# Patient Record
Sex: Male | Born: 1945
Health system: Southern US, Community
[De-identification: ages and names within clinical notes are randomized; demographics above are authoritative.]

## PROBLEM LIST (undated history)

## (undated) DIAGNOSIS — I10 Essential (primary) hypertension: Secondary | ICD-10-CM

## (undated) DIAGNOSIS — R0600 Dyspnea, unspecified: Secondary | ICD-10-CM

## (undated) DIAGNOSIS — N400 Enlarged prostate without lower urinary tract symptoms: Secondary | ICD-10-CM

## (undated) DIAGNOSIS — J449 Chronic obstructive pulmonary disease, unspecified: Secondary | ICD-10-CM

## (undated) DIAGNOSIS — F32A Depression, unspecified: Secondary | ICD-10-CM

## (undated) DIAGNOSIS — M1990 Unspecified osteoarthritis, unspecified site: Secondary | ICD-10-CM

## (undated) DIAGNOSIS — G4730 Sleep apnea, unspecified: Secondary | ICD-10-CM

## (undated) DIAGNOSIS — E7800 Pure hypercholesterolemia, unspecified: Secondary | ICD-10-CM

## (undated) DIAGNOSIS — I499 Cardiac arrhythmia, unspecified: Secondary | ICD-10-CM

## (undated) DIAGNOSIS — K219 Gastro-esophageal reflux disease without esophagitis: Secondary | ICD-10-CM

## (undated) DIAGNOSIS — F419 Anxiety disorder, unspecified: Secondary | ICD-10-CM

## (undated) DIAGNOSIS — Z974 Presence of external hearing-aid: Secondary | ICD-10-CM

## (undated) HISTORY — DX: Presence of external hearing-aid: Z97.4

## (undated) HISTORY — DX: Benign prostatic hyperplasia without lower urinary tract symptoms: N40.0

## (undated) HISTORY — PX: UPPER GI ENDOSCOPY: SHX6162

## (undated) HISTORY — DX: Pure hypercholesterolemia, unspecified: E78.00

## (undated) HISTORY — DX: Chronic obstructive pulmonary disease, unspecified: J44.9

## (undated) HISTORY — DX: Unspecified osteoarthritis, unspecified site: M19.90

---

## 1968-07-13 HISTORY — PX: CHEEK AUGMENTATION: SHX1335

## 2003-07-14 HISTORY — PX: EYE SURGERY: SHX253

## 2015-07-14 HISTORY — PX: COLONOSCOPY: SHX174

## 2016-01-30 DIAGNOSIS — R0683 Snoring: Secondary | ICD-10-CM | POA: Insufficient documentation

## 2016-01-30 DIAGNOSIS — G4710 Hypersomnia, unspecified: Secondary | ICD-10-CM | POA: Insufficient documentation

## 2016-01-30 DIAGNOSIS — G4733 Obstructive sleep apnea (adult) (pediatric): Secondary | ICD-10-CM | POA: Insufficient documentation

## 2019-05-23 ENCOUNTER — Other Ambulatory Visit: Payer: Self-pay

## 2019-05-23 ENCOUNTER — Encounter: Payer: Self-pay | Admitting: Nurse Practitioner

## 2019-05-23 ENCOUNTER — Ambulatory Visit: Payer: Medicare Other | Admitting: Nurse Practitioner

## 2019-05-23 VITALS — BP 138/84 | HR 67 | Temp 98.3°F | Ht 72.0 in | Wt 205.0 lb

## 2019-05-23 DIAGNOSIS — J449 Chronic obstructive pulmonary disease, unspecified: Secondary | ICD-10-CM | POA: Diagnosis not present

## 2019-05-23 DIAGNOSIS — K219 Gastro-esophageal reflux disease without esophagitis: Secondary | ICD-10-CM | POA: Diagnosis not present

## 2019-05-23 DIAGNOSIS — M170 Bilateral primary osteoarthritis of knee: Secondary | ICD-10-CM

## 2019-05-23 DIAGNOSIS — F5101 Primary insomnia: Secondary | ICD-10-CM

## 2019-05-23 DIAGNOSIS — N401 Enlarged prostate with lower urinary tract symptoms: Secondary | ICD-10-CM

## 2019-05-23 DIAGNOSIS — R350 Frequency of micturition: Secondary | ICD-10-CM

## 2019-05-23 NOTE — Progress Notes (Signed)
Careteam: No care team member to display  Advanced Directive information Does Patient Have a Medical Advance Directive?: Yes, Type of Advance Directive: Living will, Does patient want to make changes to medical advance directive?: No - Patient declined  Allergies  Allergen Reactions  . Bee Venom     Throat/mouth swelling    Chief Complaint  Patient presents with  . Establish Care    Patient is seen to establish care with [facility], wanting orthopedic referral      HPI: Patient is a 73 y.o. male seen in today at the [facility] to establish care.   Recently moved to [place] from [place]. His sister lives in [place] but the continuum of care is what brought them to [place]. Pt has family hx of memory issues and suspects he will too.   Osteoarthritis- mostly has been pain in knees, uses tylenol 500 mg by mouth as needed. Uses omega 3 for points and heart health. Uses nabumetone 750 mg 2 tablets daily. Would like referral to orthopedic who can do. Bilateral knees, left worse than right.   Family hx of memory loss- on prevagen 10 mg daily  COPD- uses albuterol HFA and nebulizer takes every other day. If he does not use this every other day he feels very short of breath.  Had PFT done but has been several years.  Overall health takes several supplements such as vit C, ASA 81 mg daily, B complex, vit B12, biotin 1000 mcg TID, cod liver oil daily, garlic daily, gelatin [id number] mg daily, vit D supplement   GERD- uses tums as needed and uses pepcid 20 mg daily   Insomnia- using benadryl 25 mg by mouth daily for sleep. Uses temazepam 15 mg as needed at bedtime  -rarely uses and did not bring Rx bottle today  BPH- using finasteride 5 mg daily with Flomax 0.4 mg daily and saw palmetto   Hyperlipidemia- taking zocor 40 mg by mouth daily, does not follow dietary modifications. Also uses celenium 200 mcg   Had physical and AWV this year around June.  Review of  Systems:  Review of Systems  Constitutional: Negative for chills, fever and weight loss.  HENT: Negative for tinnitus.   Respiratory: Positive for shortness of breath. Negative for cough and sputum production.   Cardiovascular: Negative for chest pain, palpitations and leg swelling.  Gastrointestinal: Negative for abdominal pain, constipation, diarrhea and heartburn.  Genitourinary: Positive for frequency. Negative for dysuria and urgency.  Musculoskeletal: Negative for back pain, falls, joint pain and myalgias.  Skin: Negative.   Neurological: Negative for dizziness and headaches.  Psychiatric/Behavioral: Negative for depression and memory loss. The patient has insomnia.     Past Medical History:  Diagnosis Date  . Arthritis    Knees  . COPD (chronic obstructive pulmonary disease) ([place])   . Does use hearing aid    Bilateral  . Enlarged prostate   . Hypercholesterolemia    Past Surgical History:  Procedure Laterality Date  . COLONOSCOPY  2017   Dr. Madolyn Frieze   Social History:   reports that he quit smoking about 40 years ago. His smoking use included cigarettes. He smoked 1.00 pack per day. He has never used smokeless tobacco. He reports previous alcohol use. No history on file for drug.  Family History  Problem Relation Age of Onset  . Alzheimer's disease Mother   . Diabetes Mother   . Heart failure Father   . Heart attack  Father   . Diabetes Father   . Dementia Father     Medications: Patient's Medications  New Prescriptions   No medications on file  Previous Medications   ACETAMINOPHEN (TYLENOL) 500 MG TABLET    Take 500 mg by mouth as needed.   ALBUTEROL (VENTOLIN HFA) 108 (90 BASE) MCG/ACT INHALER    Inhale 2 puffs into the lungs every 4 (four) hours as needed for wheezing or shortness of breath.   APOAEQUORIN (PREVAGEN) 10 MG CAPS    Take by mouth.   ASCORBIC ACID (VITAMIN C) 1000 MG TABLET    Take 1,000 mg by mouth daily.   ASPIRIN EC 81 MG TABLET    Take 81 mg by  mouth daily.   B COMPLEX-C (SUPER B-COMPLEX + VITAMIN C PO)    Take by mouth.   BIOTIN 1000 MCG TABLET    Take 1,000 mcg by mouth 3 (three) times daily.   BLACK ELDERBERRY,BERRY-FLOWER, 575 MG CAPS    Take by mouth.   CALCIUM ELEMENTAL AS CARBONATE (BARIATRIC TUMS ULTRA) 400 MG CHEWABLE TABLET    Chew 1,000 mg by mouth 3 (three) times daily.   CHOLECALCIFEROL 50 MCG (2000 UT) TABS    Take by mouth.   COD LIVER OIL/VITAMINS A & D CAPS    Take by mouth.   DIPHENHYDRAMINE (BENADRYL) 25 MG TABLET    Take 25 mg by mouth.   DUTASTERIDE (AVODART) 0.5 MG CAPSULE    Take 0.5 mg by mouth daily.   DUTASTERIDE-TAMSULOSIN HCL 0.5-0.4 MG CAPS    Take by mouth.   FAMOTIDINE (PEPCID) 20 MG TABLET    Take 20 mg by mouth.   FENOPROFEN (NALFON) 600 MG TABS TABLET    Take 600 mg by mouth.   FINASTERIDE (PROSCAR) 5 MG TABLET    Take 5 mg by mouth daily.   GARLIC 123XX123 MG CAPS    Take by mouth.   GELATIN 650 MG CAPSULE    Take 1,300 mg by mouth daily.   L-ARGININE 500 MG CAPS    Take by mouth.   LORATADINE (CLARITIN) 10 MG TABLET    Take 10 mg by mouth daily.   MILK THISTLE 175 MG TABLET    Take 175 mg by mouth daily.   MISC NATURAL PRODUCTS (OSTEO BI-FLEX ADV TRIPLE ST) TABS    Take by mouth.   MULTIPLE VITAMINS-MINERALS (ONE DAILY MENS HEALTH) TABS    Take by mouth.   NABUMETONE (RELAFEN) 750 MG TABLET    Take 1,500 mg by mouth daily. 2 tablets to = 1500 mg   OMEGA-3 1000 MG CAPS    Take by mouth.   SAW PALMETTO 450 MG CAPS    Take by mouth.   SELENIUM 200 MCG TABS TABLET    Take by mouth daily.   SIMVASTATIN (ZOCOR) 40 MG TABLET    Take 40 mg by mouth daily.   TAMSULOSIN (FLOMAX) 0.4 MG CAPS CAPSULE    Take 0.4 mg by mouth.   VITAMIN B-12 (CYANOCOBALAMIN) 500 MCG TABLET    Take 500 mcg by mouth daily.   VITAMIN E 180 MG CAPS    Take by mouth.  Modified Medications   No medications on file  Discontinued Medications   No medications on file    Physical Exam:  Vitals:   05/23/19 1420  BP: 138/84   Pulse: 67  Temp: 98.3 F (36.8 C)  TempSrc: Oral  SpO2: 97%  Weight: 205 lb (93 kg)  Height:  6' (1.829 m)   Body mass index is 27.8 kg/m. Wt Readings from Last 3 Encounters:  05/23/19 205 lb (93 kg)    Physical Exam Constitutional:      General: He is not in acute distress.    Appearance: He is well-developed. He is not diaphoretic.  HENT:     Head: Normocephalic and atraumatic.     Mouth/Throat:     Pharynx: No oropharyngeal exudate.  Eyes:     Conjunctiva/sclera: Conjunctivae normal.     Pupils: Pupils are equal, round, and reactive to light.  Neck:     Musculoskeletal: Normal range of motion and neck supple.  Cardiovascular:     Rate and Rhythm: Normal rate and regular rhythm.     Heart sounds: Normal heart sounds.  Pulmonary:     Effort: Pulmonary effort is normal.     Breath sounds: Normal breath sounds.  Abdominal:     General: Bowel sounds are normal.     Palpations: Abdomen is soft.  Musculoskeletal:        General: No tenderness.  Skin:    General: Skin is warm and dry.  Neurological:     Mental Status: He is alert and oriented to person, place, and time.     Labs reviewed: Basic Metabolic Panel: No results for input(s): NA, K, CL, CO2, GLUCOSE, BUN, CREATININE, CALCIUM, MG, PHOS, TSH in the last 8760 hours. Liver Function Tests: No results for input(s): AST, ALT, ALKPHOS, BILITOT, PROT, ALBUMIN in the last 8760 hours. No results for input(s): LIPASE, AMYLASE in the last 8760 hours. No results for input(s): AMMONIA in the last 8760 hours. CBC: No results for input(s): WBC, NEUTROABS, HGB, HCT, MCV, PLT in the last 8760 hours. Lipid Panel: No results for input(s): CHOL, HDL, LDLCALC, TRIG, CHOLHDL, LDLDIRECT in the last 8760 hours. TSH: No results for input(s): TSH in the last 8760 hours. A1C: No results found for: HGBA1C   Assessment/Plan 1. Primary osteoarthritis of both knees -ongoing pain, uses nabumetone 1500 mg by mouth daily with  tylenol as needed. Discussed adverse effect of NSAIDs.  - AMB referral to orthopedics  2. Gastroesophageal reflux disease without esophagitis -stable on famotidine 20 mg by mouth daily, uses on tum   3. Chronic obstructive pulmonary disease, unspecified COPD type ([place]) -using albuterol every other day but no long term inhaler. Discussed proper management of COPD. Encouraged him to use albuterol as needed only and not routinely. If he is still needed frequently will consider daily treatment.   4. Benign prostatic hyperplasia with urinary frequency -has been on multiple combination in the past. Currently maintained on flomax and Proscar.   5. Primary insomnia -uses benadryl routinely, information provided about adverse effects of benadryl use. Recommended to stop and to use melatonin 3-6 mg by mouth daily at bedtime. Reports occasionally will use temazepam but did not bring medication bottles.   Next appt: 2 months for routine follow up [place]. Harle Battiest  [facility] & Adult Medicine [phone]   Total time 60 mins:  time greater than 50% of total time spent doing pt counseling and coordination of care.

## 2019-05-23 NOTE — Patient Instructions (Addendum)
FOR SLEEP To stop benadryl  To use melatonin 3-6 mg by mouth at bedtime.   For COPD Try to minimized albuterol- use this as needed only   If you do not hear back about referral in a week please call our office back and ask to speak with Lattie Haw (our referral coordinator) for an update.

## 2019-07-27 DIAGNOSIS — M1711 Unilateral primary osteoarthritis, right knee: Secondary | ICD-10-CM | POA: Insufficient documentation

## 2019-07-27 DIAGNOSIS — M25562 Pain in left knee: Secondary | ICD-10-CM | POA: Diagnosis not present

## 2019-07-27 DIAGNOSIS — M1712 Unilateral primary osteoarthritis, left knee: Secondary | ICD-10-CM | POA: Diagnosis not present

## 2019-07-31 ENCOUNTER — Ambulatory Visit: Payer: Self-pay | Admitting: Nurse Practitioner

## 2019-08-01 ENCOUNTER — Encounter: Payer: Self-pay | Admitting: Nurse Practitioner

## 2019-08-01 ENCOUNTER — Other Ambulatory Visit: Payer: Self-pay

## 2019-08-01 ENCOUNTER — Encounter: Payer: Medicare PPO | Admitting: Nurse Practitioner

## 2019-08-01 NOTE — Progress Notes (Signed)
err

## 2019-08-07 ENCOUNTER — Telehealth: Payer: Self-pay

## 2019-08-07 NOTE — Telephone Encounter (Signed)
Patient called to cancel his appointment at [facility] on 08/10/19. He declined to reschedule.  I did notice that he has an appointment with Dr. Silvio Pate to establish as a new patient.  I cancelled his appointment as requested.

## 2019-08-07 NOTE — Telephone Encounter (Signed)
Noted  

## 2019-08-10 ENCOUNTER — Ambulatory Visit: Payer: Medicare PPO | Admitting: Nurse Practitioner

## 2019-08-10 ENCOUNTER — Ambulatory Visit: Payer: Self-pay | Admitting: Nurse Practitioner

## 2019-08-10 ENCOUNTER — Encounter: Payer: Self-pay | Admitting: Nurse Practitioner

## 2019-08-10 ENCOUNTER — Other Ambulatory Visit: Payer: Self-pay

## 2019-08-10 VITALS — BP 150/90 | HR 62 | Temp 97.4°F | Resp 18 | Ht 72.0 in | Wt 212.5 lb

## 2019-08-10 DIAGNOSIS — E782 Mixed hyperlipidemia: Secondary | ICD-10-CM | POA: Diagnosis not present

## 2019-08-10 DIAGNOSIS — J449 Chronic obstructive pulmonary disease, unspecified: Secondary | ICD-10-CM

## 2019-08-10 DIAGNOSIS — N529 Male erectile dysfunction, unspecified: Secondary | ICD-10-CM

## 2019-08-10 DIAGNOSIS — I10 Essential (primary) hypertension: Secondary | ICD-10-CM | POA: Diagnosis not present

## 2019-08-10 DIAGNOSIS — J302 Other seasonal allergic rhinitis: Secondary | ICD-10-CM

## 2019-08-10 DIAGNOSIS — R7989 Other specified abnormal findings of blood chemistry: Secondary | ICD-10-CM

## 2019-08-10 DIAGNOSIS — M170 Bilateral primary osteoarthritis of knee: Secondary | ICD-10-CM | POA: Diagnosis not present

## 2019-08-10 DIAGNOSIS — F5101 Primary insomnia: Secondary | ICD-10-CM

## 2019-08-10 DIAGNOSIS — N401 Enlarged prostate with lower urinary tract symptoms: Secondary | ICD-10-CM | POA: Diagnosis not present

## 2019-08-10 DIAGNOSIS — R350 Frequency of micturition: Secondary | ICD-10-CM

## 2019-08-10 MED ORDER — FLUTICASONE PROPIONATE 50 MCG/ACT NA SUSP: 1.0000 | Freq: Every day | NASAL | 1 refills | Status: DC

## 2019-08-10 MED ORDER — LORATADINE 10 MG PO TABS: 10.0000 mg | ORAL_TABLET | Freq: Every day | ORAL | 1 refills | Status: DC

## 2019-08-10 MED ORDER — TADALAFIL 20 MG PO TABS: 20.0000 mg | ORAL_TABLET | Freq: Every day | ORAL | 0 refills | Status: DC | PRN

## 2019-08-10 MED ORDER — TAMSULOSIN HCL 0.4 MG PO CAPS: 0.4000 mg | ORAL_CAPSULE | Freq: Every day | ORAL | 1 refills | Status: DC

## 2019-08-10 MED ORDER — FINASTERIDE 5 MG PO TABS: 5.0000 mg | ORAL_TABLET | Freq: Every day | ORAL | 1 refills | Status: DC

## 2019-08-10 NOTE — Patient Instructions (Addendum)
  For testosterone- [facility] [phone] Mar[place] Emery   To come back to [place] [place] between 7-7:15 on Monday or Thursday for lab work.

## 2019-08-10 NOTE — Progress Notes (Signed)
Careteam: Patient Care Team: Lauree Chandler, NP as PCP - General (Geriatric Medicine)  Advanced Directive information     Allergies  Allergen Reactions  . Bee Venom     Throat/mouth swelling    Chief Complaint  Patient presents with  . Medical Management of Chronic Issues    Follow Up/Medication Refill      HPI: Patient is a 74 y.o. male seen in today at the [facility] for routine follow up.   Elevated blood pressure- occasionally will have elevated blood pressure at doctors visit. Generally in the 130s/80s range. Sometimes down further.   Hyperlipemia- attempts heart healthy diet.   Got COVID vaccine 2 weeks ago.    OA of bilateral knees- saw Dr Alma Friendly and very satisfied with his visit. Plans to give injections- series of 3  COPD- has worsening shortness of breath when he exercises and uses routinely every other day. Uses half an ampule of albuterol nebulizer. Previous PFT by PCP.   Still have not gotten medical records- last blood work about a year ago.   BPH with urinary frequency- continues on proscar and avodart. Which improved symptoms.   Low Testerone, uses androderm 4 mg patch prescribed daily but does not use daily.   Seasonal allergies- needs refill on loratadine 10 mg daily - uses as needed takes loratadine with D  Previous PCP with [facility]- need to get records.    Review of Systems:  Review of Systems  Constitutional: Negative for chills, fever and weight loss.  HENT: Negative for tinnitus.   Respiratory: Positive for shortness of breath (with exercise, uses albuterol). Negative for cough and sputum production.   Cardiovascular: Negative for chest pain, palpitations and leg swelling.  Gastrointestinal: Negative for abdominal pain, constipation, diarrhea and heartburn.  Genitourinary: Negative for dysuria, frequency and urgency.  Musculoskeletal: Positive for joint pain (knees). Negative for back pain, falls and myalgias.  Skin:  Negative.   Neurological: Negative for dizziness and headaches.  Endo/Heme/Allergies: Positive for environmental allergies.  Psychiatric/Behavioral: Negative for depression and memory loss. The patient does not have insomnia.     Past Medical History:  Diagnosis Date  . Arthritis    Knees  . COPD (chronic obstructive pulmonary disease) ([place])   . Does use hearing aid    Bilateral  . Enlarged prostate   . Hypercholesterolemia    Past Surgical History:  Procedure Laterality Date  . COLONOSCOPY  2017   Dr. Madolyn Frieze   Social History:   reports that he quit smoking about 41 years ago. His smoking use included cigarettes. He smoked 1.00 pack per day. He has never used smokeless tobacco. He reports previous alcohol use. No history on file for drug.  Family History  Problem Relation Age of Onset  . Alzheimer's disease Mother   . Diabetes Mother   . Heart failure Father   . Heart attack Father   . Diabetes Father   . Dementia Father     Medications: Patient's Medications  New Prescriptions   No medications on file  Previous Medications   ACETAMINOPHEN (TYLENOL) 500 MG TABLET    Take 500 mg by mouth as needed.   ALBUTEROL (VENTOLIN HFA) 108 (90 BASE) MCG/ACT INHALER    Inhale 2 puffs into the lungs every 4 (four) hours as needed for wheezing or shortness of breath.   APOAEQUORIN (PREVAGEN) 10 MG CAPS    Take by mouth.   ASCORBIC ACID (VITAMIN C) 1000 MG TABLET  Take 1,000 mg by mouth daily.   ASPIRIN EC 81 MG TABLET    Take 81 mg by mouth daily.   B COMPLEX-C (SUPER B-COMPLEX + VITAMIN C PO)    Take by mouth.   BIOTIN 1000 MCG TABLET    Take 1,000 mcg by mouth daily.    CALCIUM ELEMENTAL AS CARBONATE (BARIATRIC TUMS ULTRA) 400 MG CHEWABLE TABLET    Chew 1,000 mg by mouth daily as needed.    COD LIVER OIL/VITAMINS A & D CAPS    Take by mouth.   DIPHENHYDRAMINE (BENADRYL) 25 MG TABLET    Take 25 mg by mouth.   FAMOTIDINE (PEPCID) 20 MG TABLET    Take 20 mg by mouth.   FINASTERIDE  (PROSCAR) 5 MG TABLET    Take 5 mg by mouth daily.   GARLIC 123XX123 MG CAPS    Take by mouth.   GELATIN 650 MG CAPSULE    Take 1,300 mg by mouth daily.   LORATADINE (CLARITIN) 10 MG TABLET    Take 10 mg by mouth daily.   MILK THISTLE 175 MG TABLET    Take 175 mg by mouth daily.   MISC NATURAL PRODUCTS (OSTEO BI-FLEX ADV TRIPLE ST) TABS    Take by mouth.   MULTIPLE VITAMINS-MINERALS (ONE DAILY MENS HEALTH) TABS    Take by mouth.   NABUMETONE (RELAFEN) 750 MG TABLET    Take 1,500 mg by mouth daily. 2 tablets to = 1500 mg   OMEGA-3 1000 MG CAPS    Take by mouth.   SAW PALMETTO 450 MG CAPS    Take by mouth.   SELENIUM 200 MCG TABS TABLET    Take by mouth daily.   SIMVASTATIN (ZOCOR) 40 MG TABLET    Take 40 mg by mouth daily.   TAMSULOSIN (FLOMAX) 0.4 MG CAPS CAPSULE    Take 0.4 mg by mouth.   VITAMIN B-12 (CYANOCOBALAMIN) 500 MCG TABLET    Take 500 mcg by mouth daily.   VITAMIN E 180 MG CAPS    Take by mouth.  Modified Medications   No medications on file  Discontinued Medications   No medications on file    Physical Exam:  Vitals:   08/10/19 1133  BP: (!) 150/90  Pulse: 62  Resp: 18  Temp: (!) 97.4 F (36.3 C)  SpO2: 98%  Weight: 212 lb 8 oz (96.4 kg)  Height: 6' (1.829 m)   Body mass index is 28.82 kg/m. Wt Readings from Last 3 Encounters:  08/10/19 212 lb 8 oz (96.4 kg)  05/23/19 205 lb (93 kg)    Physical Exam Constitutional:      General: He is not in acute distress.    Appearance: He is well-developed. He is not diaphoretic.  HENT:     Head: Normocephalic and atraumatic.  Eyes:     Conjunctiva/sclera: Conjunctivae normal.     Pupils: Pupils are equal, round, and reactive to light.  Cardiovascular:     Rate and Rhythm: Normal rate and regular rhythm.     Heart sounds: Normal heart sounds.  Pulmonary:     Effort: Pulmonary effort is normal.     Breath sounds: Normal breath sounds.  Abdominal:     General: Bowel sounds are normal.     Palpations: Abdomen is soft.   Musculoskeletal:        General: No tenderness.     Cervical back: Normal range of motion and neck supple.  Skin:    General:  Skin is warm and dry.  Neurological:     Mental Status: He is alert and oriented to person, place, and time.     Labs reviewed: Basic Metabolic Panel: No results for input(s): NA, K, CL, CO2, GLUCOSE, BUN, CREATININE, CALCIUM, MG, PHOS, TSH in the last 8760 hours. Liver Function Tests: No results for input(s): AST, ALT, ALKPHOS, BILITOT, PROT, ALBUMIN in the last 8760 hours. No results for input(s): LIPASE, AMYLASE in the last 8760 hours. No results for input(s): AMMONIA in the last 8760 hours. CBC: No results for input(s): WBC, NEUTROABS, HGB, HCT, MCV, PLT in the last 8760 hours. Lipid Panel: No results for input(s): CHOL, HDL, LDLCALC, TRIG, CHOLHDL, LDLDIRECT in the last 8760 hours. TSH: No results for input(s): TSH in the last 8760 hours. A1C: No results found for: HGBA1C   Assessment/Plan 1. Seasonal allergies -advised against use of decongestants as it can raise blood pressure - loratadine (CLARITIN) 10 MG tablet; Take 1 tablet (10 mg total) by mouth daily.  Dispense: 90 tablet; Refill: 1 - fluticasone (FLONASE) 50 MCG/ACT nasal spray; Place 1 spray into both nostrils daily.  Dispense: 9.9 mL; Refill: 1  2. Chronic obstructive pulmonary disease, unspecified COPD type ([place]) -using albuterol frequently throughout the week.  - Ambulatory referral to Pulmonology for further evaluation and management.  3. Benign prostatic hyperplasia with urinary frequency -stable, follow up PSA - tamsulosin (FLOMAX) 0.4 MG CAPS capsule; Take 1 capsule (0.4 mg total) by mouth daily.  Dispense: 90 capsule; Refill: 1 - finasteride (PROSCAR) 5 MG tablet; Take 1 tablet (5 mg total) by mouth daily.  Dispense: 90 tablet; Refill: 1  4. Primary insomnia -continues on melatonin 3 mg daily  5. Mixed hyperlipidemia Will follow up lipids and CMP, continues on zocor 40 mg  daily  6. Erectile dysfunction, unspecified erectile dysfunction type - tadalafil (CIALIS) 20 MG tablet; Take 1 tablet (20 mg total) by mouth daily as needed for erectile dysfunction.  Dispense: 15 tablet; Refill: 0  7. Essential hypertension Elevated today but has taken claritin with D, advised agansit decongestants as these do raise blood pressure. Low sodium diet also recommended.   8. Primary osteoarthritis of both knees Ongoing, now following with orthopedic for injection.   9. Low testosterone Interested in [place] clinical trial clinic, information provided. Does not take supplement routinely   Next appt: 6 month for AWV and EV Vana Arif K. Harle Battiest  [facility] & Adult Medicine [phone]   Total time 60 mins:  time greater than 50% of total time spent doing pt counseling and coordination of care.

## 2019-08-15 ENCOUNTER — Ambulatory Visit: Payer: Medicare PPO | Admitting: Nurse Practitioner

## 2019-08-21 DIAGNOSIS — E782 Mixed hyperlipidemia: Secondary | ICD-10-CM | POA: Diagnosis not present

## 2019-08-21 DIAGNOSIS — I10 Essential (primary) hypertension: Secondary | ICD-10-CM | POA: Diagnosis not present

## 2019-08-21 DIAGNOSIS — R350 Frequency of micturition: Secondary | ICD-10-CM | POA: Diagnosis not present

## 2019-08-21 DIAGNOSIS — N401 Enlarged prostate with lower urinary tract symptoms: Secondary | ICD-10-CM | POA: Diagnosis not present

## 2019-08-21 LAB — CBC AND DIFFERENTIAL
HCT: 45 (ref 41–53)
Hemoglobin: 16.1 (ref 13.5–17.5)
Neutrophils Absolute: 2337
Platelets: 122 — AB (ref 150–399)
WBC: 5.3

## 2019-08-21 LAB — BASIC METABOLIC PANEL
BUN: 22 — AB (ref 4–21)
CO2: 25 — AB (ref 13–22)
Chloride: 106 (ref 99–108)
Creatinine: 0.8 (ref 0.6–1.3)
Glucose: 100
Potassium: 4.3 (ref 3.4–5.3)
Sodium: 139 (ref 137–147)

## 2019-08-21 LAB — LIPID PANEL
Cholesterol: 162 (ref 0–200)
HDL: 38 (ref 35–70)
LDL Cholesterol: 103
Triglycerides: 118 (ref 40–160)

## 2019-08-21 LAB — HEPATIC FUNCTION PANEL
ALT: 21 (ref 10–40)
AST: 21 (ref 14–40)
Alkaline Phosphatase: 63 (ref 25–125)
Bilirubin, Total: 0.9

## 2019-08-21 LAB — CBC: RBC: 4.9 (ref 3.87–5.11)

## 2019-08-21 LAB — PSA: PSA: 0.3

## 2019-08-21 LAB — COMPREHENSIVE METABOLIC PANEL
Albumin: 4.3 (ref 3.5–5.0)
Calcium: 9.7 (ref 8.7–10.7)
GFR calc Af Amer: 100
GFR calc non Af Amer: 87
Globulin: 2.4

## 2019-08-23 ENCOUNTER — Telehealth: Payer: Self-pay

## 2019-08-23 NOTE — Telephone Encounter (Signed)
Patient was called and informed of lab results. Patient is aware per Sherrie Mustache, NP, that Cholesterol looks good, Kidney & Liver function is normal, Electrolytes are normal, PSA ( prostate ) is normal, Platelet count is slightly low but will be monitored/ otherwise blood counts normal.

## 2019-08-29 DIAGNOSIS — M1712 Unilateral primary osteoarthritis, left knee: Secondary | ICD-10-CM | POA: Diagnosis not present

## 2019-08-29 DIAGNOSIS — M1711 Unilateral primary osteoarthritis, right knee: Secondary | ICD-10-CM | POA: Diagnosis not present

## 2019-08-29 DIAGNOSIS — M25562 Pain in left knee: Secondary | ICD-10-CM | POA: Diagnosis not present

## 2019-08-29 DIAGNOSIS — M170 Bilateral primary osteoarthritis of knee: Secondary | ICD-10-CM | POA: Diagnosis not present

## 2019-09-05 DIAGNOSIS — M1712 Unilateral primary osteoarthritis, left knee: Secondary | ICD-10-CM | POA: Diagnosis not present

## 2019-09-05 DIAGNOSIS — M1711 Unilateral primary osteoarthritis, right knee: Secondary | ICD-10-CM | POA: Diagnosis not present

## 2019-09-05 DIAGNOSIS — M170 Bilateral primary osteoarthritis of knee: Secondary | ICD-10-CM | POA: Diagnosis not present

## 2019-09-05 DIAGNOSIS — M25562 Pain in left knee: Secondary | ICD-10-CM | POA: Diagnosis not present

## 2019-09-07 ENCOUNTER — Other Ambulatory Visit: Payer: Self-pay | Admitting: *Deleted

## 2019-09-07 MED ORDER — TEMAZEPAM 15 MG PO CAPS: 15.0000 mg | ORAL_CAPSULE | Freq: Every evening | ORAL | 0 refills | Status: DC | PRN

## 2019-09-07 NOTE — Telephone Encounter (Signed)
Patient called and stated that he was a new patient of Jessica's at [facility] and requesting a 90 day supply of his medication.  Pended Rx and sent to [facility] for approval.

## 2019-09-08 ENCOUNTER — Other Ambulatory Visit: Payer: Self-pay | Admitting: Nurse Practitioner

## 2019-09-08 DIAGNOSIS — J302 Other seasonal allergic rhinitis: Secondary | ICD-10-CM

## 2019-09-12 DIAGNOSIS — M170 Bilateral primary osteoarthritis of knee: Secondary | ICD-10-CM | POA: Diagnosis not present

## 2019-09-12 DIAGNOSIS — M25561 Pain in right knee: Secondary | ICD-10-CM | POA: Diagnosis not present

## 2019-09-12 DIAGNOSIS — M25562 Pain in left knee: Secondary | ICD-10-CM | POA: Diagnosis not present

## 2019-09-19 ENCOUNTER — Encounter: Payer: Self-pay | Admitting: Pulmonary Disease

## 2019-09-19 ENCOUNTER — Ambulatory Visit: Payer: Medicare PPO | Admitting: Pulmonary Disease

## 2019-09-19 ENCOUNTER — Other Ambulatory Visit: Payer: Self-pay

## 2019-09-19 VITALS — BP 126/70 | HR 70 | Temp 97.8°F | Ht 72.0 in | Wt 215.2 lb

## 2019-09-19 DIAGNOSIS — R0609 Other forms of dyspnea: Secondary | ICD-10-CM

## 2019-09-19 DIAGNOSIS — I498 Other specified cardiac arrhythmias: Secondary | ICD-10-CM

## 2019-09-19 DIAGNOSIS — R0600 Dyspnea, unspecified: Secondary | ICD-10-CM

## 2019-09-19 DIAGNOSIS — R0989 Other specified symptoms and signs involving the circulatory and respiratory systems: Secondary | ICD-10-CM

## 2019-09-19 DIAGNOSIS — J449 Chronic obstructive pulmonary disease, unspecified: Secondary | ICD-10-CM

## 2019-09-19 MED ORDER — SPIRIVA RESPIMAT 2.5 MCG/ACT IN AERS: 2.0000 | INHALATION_SPRAY | Freq: Every day | RESPIRATORY_TRACT | 0 refills | Status: DC

## 2019-09-19 NOTE — Progress Notes (Signed)
 Assessment & Plan:  1. COPD suggested by initial evaluation (Primary) - Pulmonary Function Test [facility] Only; Future  2. Dyspnea on exertion - Pulmonary Function Test [facility] Only; Future  3. Junctional rhythm - Ambulatory referral to Cardiology   Patient Instructions  We are going to refer you to cardiology so that your cardiac issues can be monitored closely.   We will schedule you for breathing tests.   We are giving you a trial of Spiriva  2 inhalations daily this is an inhaler that should be able to maintain you without having to use too much of your albuterol .  Please let us  know how this is doing for you so we can call in the prescription.   We will see you in follow-up in 3 months time call sooner should any new difficulties arise.    Please note: late entry documentation due to logistical difficulties during COVID-19 pandemic. This note is filed for information purposes only, and is not intended to be used for billing, nor does it represent the full scope/nature of the visit in question. Please see any associated scanned media linked to date of encounter for additional pertinent information.  Subjective:    HPI: Joshua Mercado is a 74 y.o. male presenting to the pulmonology clinic on 09/19/2019 with report of: pulmonary consult (per Harlene An, NP--hx of COPD. c/o occ sob with exertion and prod cough with white mucus)     Outpatient Encounter Medications as of 09/19/2019  Medication Sig   melatonin 5 MG TABS Take 5 mg by mouth at bedtime.    [DISCONTINUED] acetaminophen  (TYLENOL ) 500 MG tablet Take 500 mg by mouth as needed.   [DISCONTINUED] albuterol  (PROVENTIL ) (2.5 MG/3ML) 0.083% nebulizer solution Take 2.5 mg by nebulization as needed for wheezing or shortness of breath.   [DISCONTINUED] albuterol  (VENTOLIN  HFA) 108 (90 Base) MCG/ACT inhaler Inhale 2 puffs into the lungs every 4 (four) hours as needed for wheezing or shortness of breath.   [DISCONTINUED] Apoaequorin  (PREVAGEN) 10 MG CAPS Take 1 capsule by mouth daily.    [DISCONTINUED] Ascorbic Acid  (VITAMIN C ) 1000 MG tablet Take 1,000 mg by mouth daily.   [DISCONTINUED] aspirin  EC 81 MG tablet Take 81 mg by mouth daily.   [DISCONTINUED] B Complex-C (SUPER B-COMPLEX + VITAMIN C  PO) Take by mouth.   [DISCONTINUED] Biotin 1000 MCG tablet Take 1,000 mcg by mouth daily.    [DISCONTINUED] calcium elemental as carbonate (BARIATRIC TUMS ULTRA) 400 MG chewable tablet Chew 1,000 mg by mouth daily as needed.    [DISCONTINUED] cetaphil (CETAPHIL) cream Apply topically as needed. (Patient not taking: Reported on 12/16/2021)   [DISCONTINUED] Cod Liver Oil/Vitamins A & D CAPS Take by mouth.   [DISCONTINUED] famotidine  (PEPCID ) 20 MG tablet Take 20 mg by mouth. (Patient not taking: Reported on 12/16/2021)   [DISCONTINUED] finasteride  (PROSCAR ) 5 MG tablet Take 1 tablet (5 mg total) by mouth daily.   [DISCONTINUED] fluticasone  (FLONASE ) 50 MCG/ACT nasal spray Place 1 spray into both nostrils daily.   [DISCONTINUED] Garlic 1000 MG CAPS Take by mouth.   [DISCONTINUED] loratadine  (CLARITIN ) 10 MG tablet Take 1 tablet (10 mg total) by mouth daily.   [DISCONTINUED] milk thistle 175 MG tablet Take 175 mg by mouth daily.   [DISCONTINUED] Misc Natural Products (OSTEO BI-FLEX ADV TRIPLE ST) TABS Take by mouth.   [DISCONTINUED] Multiple Vitamins-Minerals (ONE DAILY MENS HEALTH) TABS Take by mouth.   [DISCONTINUED] nabumetone  (RELAFEN ) 750 MG tablet Take 1,500 mg by mouth daily. 2 tablets  to = 1500 mg   [DISCONTINUED] Omega-3 1000 MG CAPS Take by mouth.   [DISCONTINUED] Saw Palmetto 450 MG CAPS Take by mouth.   [DISCONTINUED] selenium  200 MCG TABS tablet Take by mouth daily.   [DISCONTINUED] simvastatin  (ZOCOR ) 40 MG tablet Take 40 mg by mouth daily.   [DISCONTINUED] tadalafil  (CIALIS ) 20 MG tablet Take 1 tablet (20 mg total) by mouth daily as needed for erectile dysfunction.   [DISCONTINUED] tamsulosin  (FLOMAX ) 0.4 MG CAPS capsule  Take 1 capsule (0.4 mg total) by mouth daily.   [DISCONTINUED] temazepam  (RESTORIL ) 15 MG capsule Take 1 capsule (15 mg total) by mouth at bedtime as needed for sleep.   [DISCONTINUED] vitamin B-12 (CYANOCOBALAMIN ) 500 MCG tablet Take 500 mcg by mouth daily.   [DISCONTINUED] Vitamin E 180 MG CAPS Take by mouth.   [DISCONTINUED] Tiotropium Bromide  Monohydrate (SPIRIVA  RESPIMAT) 2.5 MCG/ACT AERS Inhale 2 puffs into the lungs daily.   No facility-administered encounter medications on file as of 09/19/2019.      Objective:   Vitals:   09/19/19 1113  BP: 126/70  Pulse: 70  Temp: 97.8 F (36.6 C)  Height: 6' (1.829 m)  Weight: 215 lb 3.2 oz (97.6 kg)  SpO2: 97%  TempSrc: Temporal  BMI (Calculated): 29.18     Physical exam documentation is limited by delayed entry of information.

## 2019-09-19 NOTE — Patient Instructions (Signed)
We are going to refer you to cardiology so that your cardiac issues can be monitored closely.   We will schedule you for breathing tests.   We are giving you a trial of Spiriva 2 inhalations daily this is an inhaler that should be able to maintain you without having to use too much of your albuterol.  Please let us know how this is doing for you so we can call in the prescription.   We will see you in follow-up in 3 months time call sooner should any new difficulties arise.

## 2019-09-20 ENCOUNTER — Ambulatory Visit: Payer: Self-pay | Admitting: Internal Medicine

## 2019-09-21 ENCOUNTER — Ambulatory Visit: Payer: Medicare PPO | Admitting: Cardiology

## 2019-09-21 DIAGNOSIS — M1711 Unilateral primary osteoarthritis, right knee: Secondary | ICD-10-CM | POA: Diagnosis not present

## 2019-09-21 DIAGNOSIS — M25561 Pain in right knee: Secondary | ICD-10-CM | POA: Diagnosis not present

## 2019-09-26 ENCOUNTER — Ambulatory Visit: Payer: Medicare PPO | Admitting: Cardiology

## 2019-09-28 ENCOUNTER — Ambulatory Visit (INDEPENDENT_AMBULATORY_CARE_PROVIDER_SITE_OTHER): Payer: Medicare PPO | Admitting: Cardiology

## 2019-09-28 ENCOUNTER — Encounter: Payer: Self-pay | Admitting: Cardiology

## 2019-09-28 ENCOUNTER — Other Ambulatory Visit: Payer: Self-pay

## 2019-09-28 VITALS — BP 148/70 | HR 63 | Ht 72.0 in | Wt 215.0 lb

## 2019-09-28 DIAGNOSIS — R030 Elevated blood-pressure reading, without diagnosis of hypertension: Secondary | ICD-10-CM

## 2019-09-28 DIAGNOSIS — I499 Cardiac arrhythmia, unspecified: Secondary | ICD-10-CM | POA: Diagnosis not present

## 2019-09-28 DIAGNOSIS — I061 Rheumatic aortic insufficiency: Secondary | ICD-10-CM | POA: Diagnosis not present

## 2019-09-28 NOTE — Patient Instructions (Signed)
Medication Instructions:  Your physician recommends that you continue on your current medications as directed. Please refer to the Current Medication list given to you today.  *If you need a refill on your cardiac medications before your next appointment, please call your pharmacy*  Lab Work: none If you have labs (blood work) drawn today and your tests are completely normal, you will receive your results only by: Marland Kitchen MyChart Message (if you have MyChart) OR . A paper copy in the mail If you have any lab test that is abnormal or we need to change your treatment, we will call you to review the results.  Testing/Procedures: In 12 months - Your physician has requested that you have an echocardiogram. Echocardiography is a painless test that uses sound waves to create images of your heart. It provides your doctor with information about the size and shape of your heart and how well your heart's chambers and valves are working. This procedure takes approximately one hour. There are no restrictions for this procedure. You may get an IV, if needed, to receive an ultrasound enhancing agent through to better visualize your heart.   Follow-Up: At [facility], you and your health needs are our priority.  As part of our continuing mission to provide you with exceptional heart care, we have created designated Provider Care Teams.  These Care Teams include your primary Cardiologist (physician) and Advanced Practice Providers (APPs -  Physician Assistants and Nurse Practitioners) who all work together to provide you with the care you need, when you need it.  We recommend signing up for the patient portal called "MyChart".  Sign up information is provided on this After Visit Summary.  MyChart is used to connect with patients for Virtual Visits (Telemedicine).  Patients are able to view lab/test results, encounter notes, upcoming appointments, etc.  Non-urgent messages can be sent to your provider as well.   To  learn more about what you can do with MyChart, go to [email or web address].    Your next appointment:   12 month(s) with echo prior to appointment  The format for your next appointment:   In Person  Provider:   Kate Sable, MD

## 2019-09-28 NOTE — Progress Notes (Signed)
Cardiology Office Note:    Date:  09/28/2019   ID:  Joshua Mercado, DOB Jul 26, 1945, MRN [id number]  PCP:  Lauree Chandler, NP  Cardiologist:  Kate Sable, MD  Electrophysiologist:  None   Referring MD: Lauree Chandler, NP   Chief Complaint  Patient presents with  . New Patient (Initial Visit)    Referred by Dr. Patsey Berthold for junctional Rhythm. Meds reviewed verbally with patient.     History of Present Illness:    Joshua Mercado is a 74 y.o. male with a hx of COPD, hyperlipidemia who presents due to, irregular heartbeat and to establish care.  Patient recently moved to the area from [facility].  He was previously followed by Dr. Cloyd Stagers at [facility] system in [place].  He notes having a history of irregular/skipped heartbeats beginning 2 to 3 years ago usually after exercise.  He exercises about 3 times a week for 1 hour with weights and other aerobic activity.  He denies chest pain, shortness of breath, palpitations during exercise.  Denies syncope or presyncope.  He was originally seen because of irregular heartbeat where he had a 14-day cardiac monitor which was unrevealing.  Only occasional PVCs were noted.  A transthoracic echocardiogram in 08/2017 showed normal ejection fraction with EF 60%, mild aortic valve regurgitation, mild MR and TR.  Patient states feeling great.  His blood pressure at home is usually [id number] systolic.  He states whenever he comes to the physician office his blood pressure goes up.  Past Medical History:  Diagnosis Date  . Arthritis    Knees  . COPD (chronic obstructive pulmonary disease) ([place])   . Does use hearing aid    Bilateral  . Enlarged prostate   . Hypercholesterolemia     Past Surgical History:  Procedure Laterality Date  . COLONOSCOPY  2017   Dr. Madolyn Frieze    Current Medications: Current Meds  Medication Sig  . acetaminophen (TYLENOL) 500 MG tablet Take 500 mg by mouth as needed.  Marland Kitchen albuterol (PROVENTIL) (2.5  MG/3ML) 0.083% nebulizer solution Take 2.5 mg by nebulization as needed for wheezing or shortness of breath.  Marland Kitchen albuterol (VENTOLIN HFA) 108 (90 Base) MCG/ACT inhaler Inhale 2 puffs into the lungs every 4 (four) hours as needed for wheezing or shortness of breath.  . Apoaequorin (PREVAGEN) 10 MG CAPS Take by mouth.  . Ascorbic Acid (VITAMIN C) 1000 MG tablet Take 1,000 mg by mouth daily.  Marland Kitchen aspirin EC 81 MG tablet Take 81 mg by mouth daily.  . B Complex-C (SUPER B-COMPLEX + VITAMIN C PO) Take by mouth.  . Biotin 1000 MCG tablet Take 1,000 mcg by mouth daily.   . calcium elemental as carbonate (BARIATRIC TUMS ULTRA) 400 MG chewable tablet Chew 1,000 mg by mouth daily as needed.   . cetaphil (CETAPHIL) cream Apply topically as needed.  Marland Kitchen Cod Liver Oil/Vitamins A & D CAPS Take by mouth.  . famotidine (PEPCID) 20 MG tablet Take 20 mg by mouth.  . finasteride (PROSCAR) 5 MG tablet Take 1 tablet (5 mg total) by mouth daily.  . fluticasone (FLONASE) 50 MCG/ACT nasal spray Place 1 spray into both nostrils daily.  . Garlic 123XX123 MG CAPS Take by mouth.  . loratadine (CLARITIN) 10 MG tablet Take 1 tablet (10 mg total) by mouth daily.  . Melatonin 3 MG TABS Take 1 tablet by mouth at bedtime.  . milk thistle 175 MG tablet Take 175 mg by mouth daily.  . Misc Natural  Products (OSTEO BI-FLEX ADV TRIPLE ST) TABS Take by mouth.  . Multiple Vitamins-Minerals (ONE DAILY MENS HEALTH) TABS Take by mouth.  . nabumetone (RELAFEN) 750 MG tablet Take 1,500 mg by mouth daily. 2 tablets to = 1500 mg  . Omega-3 1000 MG CAPS Take by mouth.  . Saw Palmetto 450 MG CAPS Take by mouth.  . selenium 200 MCG TABS tablet Take by mouth daily.  . simvastatin (ZOCOR) 40 MG tablet Take 40 mg by mouth daily.  . tadalafil (CIALIS) 20 MG tablet Take 1 tablet (20 mg total) by mouth daily as needed for erectile dysfunction.  . tamsulosin (FLOMAX) 0.4 MG CAPS capsule Take 1 capsule (0.4 mg total) by mouth daily.  . temazepam (RESTORIL) 15  MG capsule Take 1 capsule (15 mg total) by mouth at bedtime as needed for sleep.  . Tiotropium Bromide Monohydrate (SPIRIVA RESPIMAT) 2.5 MCG/ACT AERS Inhale 2 puffs into the lungs daily.  . vitamin B-12 (CYANOCOBALAMIN) 500 MCG tablet Take 500 mcg by mouth daily.  . Vitamin E 180 MG CAPS Take by mouth.     Allergies:   Bee venom   Social History   Socioeconomic History  . Marital status: Unknown    Spouse name: Not on file  . Number of children: Not on file  . Years of education: Not on file  . Highest education level: Not on file  Occupational History  . Not on file  Tobacco Use  . Smoking status: Former Smoker    Packs/day: 1.00    Years: 35.00    Pack years: 35.00    Types: Cigarettes    Quit date: 1980    Years since quitting: 41.2  . Smokeless tobacco: Never Used  Substance and Sexual Activity  . Alcohol use: Not Currently  . Drug use: Not on file  . Sexual activity: Not on file  Other Topics Concern  . Not on file  Social History Narrative   No pets.  Clinical biochemist.  Trade school education.  Wife is a retired Engineer, drilling.    Social Determinants of Health   Financial Resource Strain:   . Difficulty of Paying Living Expenses:   Food Insecurity:   . Worried About Charity fundraiser in the Last Year:   . Arboriculturist in the Last Year:   Transportation Needs:   . Film/video editor (Medical):   Marland Kitchen Lack of Transportation (Non-Medical):   Physical Activity:   . Days of Exercise per Week:   . Minutes of Exercise per Session:   Stress:   . Feeling of Stress :   Social Connections:   . Frequency of Communication with Friends and Family:   . Frequency of Social Gatherings with Friends and Family:   . Attends Religious Services:   . Active Member of Clubs or Organizations:   . Attends Archivist Meetings:   Marland Kitchen Marital Status:      Family History: The patient's family history includes Alzheimer's disease in his mother; Dementia in his father;  Diabetes in his father and mother; Heart attack in his father; Heart failure in his father.  ROS:   Please see the history of present illness.     All other systems reviewed and are negative.  EKGs/Labs/Other Studies Reviewed:    The following studies were reviewed today:   EKG:  EKG is  ordered today.  The ekg ordered today demonstrates sinus rhythm, marked sinus arrhythmia, otherwise normal ECG.  Recent Labs: 08/21/2019: ALT  21; BUN 22; Creatinine 0.8; Hemoglobin 16.1; Platelets 122; Potassium 4.3; Sodium 139  Recent Lipid Panel    Component Value Date/Time   CHOL 162 08/21/2019 0000   TRIG 118 08/21/2019 0000   HDL 38 08/21/2019 0000   LDLCALC 103 08/21/2019 0000    Physical Exam:    VS:  BP (!) 148/70 (BP Location: Right Arm, Patient Position: Sitting, Cuff Size: Normal)   Pulse 63   Ht 6' (1.829 m)   Wt 215 lb (97.5 kg)   BMI 29.16 kg/m     Wt Readings from Last 3 Encounters:  09/28/19 215 lb (97.5 kg)  09/19/19 215 lb 3.2 oz (97.6 kg)  08/10/19 212 lb 8 oz (96.4 kg)     GEN:  Well nourished, well developed in no acute distress HEENT: Normal NECK: No JVD; No carotid bruits LYMPHATICS: No lymphadenopathy CARDIAC: RRR, no murmurs, rubs, gallops RESPIRATORY:  Clear to auscultation without rales, wheezing or rhonchi  ABDOMEN: Soft, non-tender, non-distended MUSCULOSKELETAL:  No edema; No deformity  SKIN: Warm and dry NEUROLOGIC:  Alert and oriented x 3 PSYCHIATRIC:  Normal affect   ASSESSMENT:    1. Irregular heart beat   2. Rheumatic aortic valve insufficiency   3. Elevated BP without diagnosis of hypertension    PLAN:    In order of problems listed above:  1. Patient with history of irregular heartbeat.  Work-up so far revealed occasional PVCs, EKG shows sinus rhythm with marked sinus arrhythmia.  With his history of COPD, ectopy is very common.  Echocardiogram showed normal ejection fraction with mild aortic regurgitation.  No further cardiac testing  indicated at this point.  Patient is asymptomatic. 2. History of mild aortic regurgitation noted in the echocardiogram in 2019.  Plan to repeat an echocardiogram in 1 year, which will be 3 years from prior. 3. BP elevated typically in the physician office.  Advised patient to check blood pressure frequently at home.  Continue to monitor for now.  Follow-up in 1 year after echocardiogram.  This note was generated in part or whole with voice recognition software. Voice recognition is usually quite accurate but there are transcription errors that can and very often do occur. I apologize for any typographical errors that were not detected and corrected.  Medication Adjustments/Labs and Tests Ordered: Current medicines are reviewed at length with the patient today.  Concerns regarding medicines are outlined above.  Orders Placed This Encounter  Procedures  . EKG 12-Lead  . ECHOCARDIOGRAM COMPLETE   No orders of the defined types were placed in this encounter.   Patient Instructions  Medication Instructions:  Your physician recommends that you continue on your current medications as directed. Please refer to the Current Medication list given to you today.  *If you need a refill on your cardiac medications before your next appointment, please call your pharmacy*  Lab Work: none If you have labs (blood work) drawn today and your tests are completely normal, you will receive your results only by: Marland Kitchen MyChart Message (if you have MyChart) OR . A paper copy in the mail If you have any lab test that is abnormal or we need to change your treatment, we will call you to review the results.  Testing/Procedures: In 12 months - Your physician has requested that you have an echocardiogram. Echocardiography is a painless test that uses sound waves to create images of your heart. It provides your doctor with information about the size and shape of your heart and how  well your heart's chambers and valves are  working. This procedure takes approximately one hour. There are no restrictions for this procedure. You may get an IV, if needed, to receive an ultrasound enhancing agent through to better visualize your heart.   Follow-Up: At [facility], you and your health needs are our priority.  As part of our continuing mission to provide you with exceptional heart care, we have created designated Provider Care Teams.  These Care Teams include your primary Cardiologist (physician) and Advanced Practice Providers (APPs -  Physician Assistants and Nurse Practitioners) who all work together to provide you with the care you need, when you need it.  We recommend signing up for the patient portal called "MyChart".  Sign up information is provided on this After Visit Summary.  MyChart is used to connect with patients for Virtual Visits (Telemedicine).  Patients are able to view lab/test results, encounter notes, upcoming appointments, etc.  Non-urgent messages can be sent to your provider as well.   To learn more about what you can do with MyChart, go to [email or web address].    Your next appointment:   12 month(s) with echo prior to appointment  The format for your next appointment:   In Person  Provider:   Kate Sable, MD     Signed, Kate Sable, MD  09/28/2019 12:32 PM    [place]

## 2019-10-01 ENCOUNTER — Other Ambulatory Visit: Payer: Self-pay | Admitting: Nurse Practitioner

## 2019-10-01 DIAGNOSIS — J302 Other seasonal allergic rhinitis: Secondary | ICD-10-CM

## 2019-10-09 ENCOUNTER — Other Ambulatory Visit: Payer: Medicare PPO

## 2019-10-10 ENCOUNTER — Ambulatory Visit: Payer: Medicare PPO

## 2019-10-23 ENCOUNTER — Ambulatory Visit: Payer: Medicare PPO

## 2019-10-28 ENCOUNTER — Other Ambulatory Visit: Payer: Self-pay | Admitting: Nurse Practitioner

## 2019-10-28 DIAGNOSIS — J302 Other seasonal allergic rhinitis: Secondary | ICD-10-CM

## 2019-10-30 ENCOUNTER — Other Ambulatory Visit
Admission: RE | Admit: 2019-10-30 | Discharge: 2019-10-30 | Disposition: A | Discharge status: Home / Self Care | Payer: Medicare PPO | Source: Ambulatory Visit | Attending: Internal Medicine | Admitting: Internal Medicine

## 2019-10-30 ENCOUNTER — Other Ambulatory Visit: Payer: Self-pay

## 2019-10-30 DIAGNOSIS — Z01812 Encounter for preprocedural laboratory examination: Secondary | ICD-10-CM | POA: Insufficient documentation

## 2019-10-30 DIAGNOSIS — Z20822 Contact with and (suspected) exposure to covid-19: Secondary | ICD-10-CM | POA: Diagnosis not present

## 2019-10-30 LAB — SARS CORONAVIRUS 2 (TAT 6-24 HRS): SARS Coronavirus 2: NEGATIVE

## 2019-10-31 ENCOUNTER — Ambulatory Visit: Payer: Medicare PPO | Attending: Pulmonary Disease

## 2019-10-31 ENCOUNTER — Other Ambulatory Visit: Payer: Self-pay

## 2019-10-31 DIAGNOSIS — J449 Chronic obstructive pulmonary disease, unspecified: Secondary | ICD-10-CM | POA: Diagnosis not present

## 2019-10-31 DIAGNOSIS — R0609 Other forms of dyspnea: Secondary | ICD-10-CM

## 2019-10-31 DIAGNOSIS — R0600 Dyspnea, unspecified: Secondary | ICD-10-CM

## 2019-10-31 MED ORDER — ALBUTEROL SULFATE (2.5 MG/3ML) 0.083% IN NEBU
2.5000 mg | INHALATION_SOLUTION | Freq: Once | RESPIRATORY_TRACT | Status: AC
  Administered 2019-10-31: 2.5 mg via RESPIRATORY_TRACT
  Filled 2019-10-31: qty 3

## 2019-11-21 ENCOUNTER — Encounter: Payer: Self-pay | Admitting: Nurse Practitioner

## 2019-11-21 ENCOUNTER — Ambulatory Visit: Payer: Medicare PPO | Admitting: Nurse Practitioner

## 2019-11-21 ENCOUNTER — Other Ambulatory Visit: Payer: Self-pay

## 2019-11-21 VITALS — BP 120/90 | HR 69 | Temp 97.4°F | Ht 72.0 in | Wt 212.0 lb

## 2019-11-21 DIAGNOSIS — M170 Bilateral primary osteoarthritis of knee: Secondary | ICD-10-CM

## 2019-11-21 DIAGNOSIS — N401 Enlarged prostate with lower urinary tract symptoms: Secondary | ICD-10-CM | POA: Diagnosis not present

## 2019-11-21 DIAGNOSIS — E782 Mixed hyperlipidemia: Secondary | ICD-10-CM

## 2019-11-21 DIAGNOSIS — R252 Cramp and spasm: Secondary | ICD-10-CM | POA: Diagnosis not present

## 2019-11-21 DIAGNOSIS — F5101 Primary insomnia: Secondary | ICD-10-CM | POA: Diagnosis not present

## 2019-11-21 DIAGNOSIS — J449 Chronic obstructive pulmonary disease, unspecified: Secondary | ICD-10-CM

## 2019-11-21 DIAGNOSIS — N529 Male erectile dysfunction, unspecified: Secondary | ICD-10-CM

## 2019-11-21 DIAGNOSIS — R350 Frequency of micturition: Secondary | ICD-10-CM

## 2019-11-21 MED ORDER — NABUMETONE 750 MG PO TABS: 1500.0000 mg | ORAL_TABLET | Freq: Every day | ORAL | 1 refills | Status: DC

## 2019-11-21 MED ORDER — CYCLOBENZAPRINE HCL 10 MG PO TABS: 10.0000 mg | ORAL_TABLET | Freq: Two times a day (BID) | ORAL | 1 refills | Status: DC | PRN

## 2019-11-21 MED ORDER — TADALAFIL 20 MG PO TABS: 20.0000 mg | ORAL_TABLET | Freq: Every day | ORAL | 0 refills | Status: DC | PRN

## 2019-11-21 MED ORDER — ALBUTEROL SULFATE HFA 108 (90 BASE) MCG/ACT IN AERS: 2.0000 | INHALATION_SPRAY | RESPIRATORY_TRACT | 3 refills | Status: DC | PRN

## 2019-11-21 MED ORDER — SIMVASTATIN 40 MG PO TABS: 40.0000 mg | ORAL_TABLET | Freq: Every day | ORAL | 3 refills | Status: DC

## 2019-11-21 MED ORDER — SPIRIVA RESPIMAT 2.5 MCG/ACT IN AERS: 2.0000 | INHALATION_SPRAY | Freq: Every day | RESPIRATORY_TRACT | 2 refills | Status: DC

## 2019-11-21 MED ORDER — FINASTERIDE 5 MG PO TABS: 5.0000 mg | ORAL_TABLET | Freq: Every day | ORAL | 3 refills | Status: DC

## 2019-11-21 MED ORDER — ALBUTEROL SULFATE (2.5 MG/3ML) 0.083% IN NEBU: 2.5000 mg | INHALATION_SOLUTION | RESPIRATORY_TRACT | 3 refills | Status: DC | PRN

## 2019-11-21 MED ORDER — TEMAZEPAM 15 MG PO CAPS: 15.0000 mg | ORAL_CAPSULE | Freq: Every evening | ORAL | 3 refills | Status: DC | PRN

## 2019-11-21 MED ORDER — TAMSULOSIN HCL 0.4 MG PO CAPS: 0.4000 mg | ORAL_CAPSULE | Freq: Every day | ORAL | 3 refills | Status: DC

## 2019-11-21 NOTE — Progress Notes (Signed)
Careteam: Patient Care Team: Lauree Chandler, NP as PCP - General (Geriatric Medicine) Kate Sable, MD as PCP - Cardiology (Cardiology)  Advanced Directive information    Allergies  Allergen Reactions  . Bee Venom     Throat/mouth swelling    Chief Complaint  Patient presents with  . Medication Management    Discuss medications to be sent to mail order.     HPI: Patient is a 74 y.o. male seen in today at the [facility] for medication refills.   He called [facility] mail order and this is much cheaper option for him.   COPD- using Spiriva with albuterol PRN (not needing with Spiriva) doing well on Spiriva   OA- using nabumetone daily, aware of adverse effects on kidney, stomach and heart with use of NSAIDs   Muscle spasm and cramps- using cyclobenzaprine, has taking up to twice daily if needed if he has muscle cramps or spasms after a hard exercise.   Review of Systems:  Review of Systems  Constitutional: Negative for chills, fever and weight loss.  HENT: Negative for tinnitus.   Respiratory: Negative for cough, sputum production and shortness of breath.   Cardiovascular: Negative for chest pain, palpitations and leg swelling.  Gastrointestinal: Negative for abdominal pain, constipation, diarrhea and heartburn.  Genitourinary: Negative for dysuria, frequency and urgency.  Musculoskeletal: Positive for joint pain and myalgias (occasionally). Negative for back pain and falls.  Skin: Negative.   Neurological: Negative for dizziness and headaches.  Psychiatric/Behavioral: Negative for depression and memory loss. The patient is not nervous/anxious and does not have insomnia.     Past Medical History:  Diagnosis Date  . Arthritis    Knees  . COPD (chronic obstructive pulmonary disease) ([place])   . Does use hearing aid    Bilateral  . Enlarged prostate   . Hypercholesterolemia    Past Surgical History:  Procedure Laterality Date  . COLONOSCOPY  2017   Dr. Madolyn Frieze   Social History:   reports that he quit smoking about 41 years ago. His smoking use included cigarettes. He has a 35.00 pack-year smoking history. He has never used smokeless tobacco. He reports previous alcohol use. He reports that he does not use drugs.  Family History  Problem Relation Age of Onset  . Alzheimer's disease Mother   . Diabetes Mother   . Heart failure Father   . Heart attack Father   . Diabetes Father   . Dementia Father     Medications: Patient's Medications  New Prescriptions   No medications on file  Previous Medications   ACETAMINOPHEN (TYLENOL) 500 MG TABLET    Take 500 mg by mouth as needed.   ALBUTEROL (PROVENTIL) (2.5 MG/3ML) 0.083% NEBULIZER SOLUTION    Take 2.5 mg by nebulization as needed for wheezing or shortness of breath.   ALBUTEROL (VENTOLIN HFA) 108 (90 BASE) MCG/ACT INHALER    Inhale 2 puffs into the lungs every 4 (four) hours as needed for wheezing or shortness of breath.   APOAEQUORIN (PREVAGEN) 10 MG CAPS    Take 1 capsule by mouth daily.    ASCORBIC ACID (VITAMIN C) 1000 MG TABLET    Take 1,000 mg by mouth daily.   ASPIRIN EC 81 MG TABLET    Take 81 mg by mouth daily.   B COMPLEX-C (SUPER B-COMPLEX + VITAMIN C PO)    Take by mouth.   BIOTIN 1000 MCG TABLET    Take 1,000 mcg by mouth daily.  CALCIUM ELEMENTAL AS CARBONATE (BARIATRIC TUMS ULTRA) 400 MG CHEWABLE TABLET    Chew 1,000 mg by mouth daily as needed.    CETAPHIL (CETAPHIL) CREAM    Apply topically as needed.   COD LIVER OIL/VITAMINS A & D CAPS    Take by mouth.   FAMOTIDINE (PEPCID) 20 MG TABLET    Take 20 mg by mouth.   FINASTERIDE (PROSCAR) 5 MG TABLET    Take 1 tablet (5 mg total) by mouth daily.   FLUTICASONE (FLONASE) 50 MCG/ACT NASAL SPRAY    PLACE 1 SPRAY INTO BOTH NOSTRILS DAILY.   GARLIC 123XX123 MG CAPS    Take by mouth.   LORATADINE (CLARITIN) 10 MG TABLET    Take 1 tablet (10 mg total) by mouth daily.   MELATONIN 3 MG TABS    Take 1 tablet by mouth at bedtime.    MILK THISTLE 175 MG TABLET    Take 175 mg by mouth daily.   MISC NATURAL PRODUCTS (OSTEO BI-FLEX ADV TRIPLE ST) TABS    Take by mouth.   MULTIPLE VITAMINS-MINERALS (ONE DAILY MENS HEALTH) TABS    Take by mouth.   NABUMETONE (RELAFEN) 750 MG TABLET    Take 1,500 mg by mouth daily. 2 tablets to = 1500 mg   OMEGA-3 1000 MG CAPS    Take by mouth.   SAW PALMETTO 450 MG CAPS    Take by mouth.   SELENIUM 200 MCG TABS TABLET    Take by mouth daily.   SIMVASTATIN (ZOCOR) 40 MG TABLET    Take 40 mg by mouth daily.   TADALAFIL (CIALIS) 20 MG TABLET    Take 1 tablet (20 mg total) by mouth daily as needed for erectile dysfunction.   TAMSULOSIN (FLOMAX) 0.4 MG CAPS CAPSULE    Take 1 capsule (0.4 mg total) by mouth daily.   TEMAZEPAM (RESTORIL) 15 MG CAPSULE    Take 1 capsule (15 mg total) by mouth at bedtime as needed for sleep.   TIOTROPIUM BROMIDE MONOHYDRATE (SPIRIVA RESPIMAT) 2.5 MCG/ACT AERS    Inhale 2 puffs into the lungs daily.   VITAMIN B-12 (CYANOCOBALAMIN) 500 MCG TABLET    Take 500 mcg by mouth daily.   VITAMIN E 180 MG CAPS    Take by mouth.  Modified Medications   No medications on file  Discontinued Medications   No medications on file    Physical Exam:  Vitals:   11/21/19 1115  BP: 120/90  Pulse: 69  Temp: (!) 97.4 F (36.3 C)  TempSrc: Temporal  SpO2: 99%  Weight: 212 lb (96.2 kg)  Height: 6' (1.829 m)   Body mass index is 28.75 kg/m. Wt Readings from Last 3 Encounters:  11/21/19 212 lb (96.2 kg)  09/28/19 215 lb (97.5 kg)  09/19/19 215 lb 3.2 oz (97.6 kg)    Physical Exam Constitutional:      General: He is not in acute distress.    Appearance: He is well-developed. He is not diaphoretic.  HENT:     Head: Normocephalic and atraumatic.     Mouth/Throat:     Pharynx: No oropharyngeal exudate.  Eyes:     Conjunctiva/sclera: Conjunctivae normal.     Pupils: Pupils are equal, round, and reactive to light.  Cardiovascular:     Rate and Rhythm: Normal rate and  regular rhythm.     Heart sounds: Normal heart sounds.  Pulmonary:     Effort: Pulmonary effort is normal.     Breath  sounds: Normal breath sounds.  Abdominal:     General: Bowel sounds are normal.     Palpations: Abdomen is soft.  Musculoskeletal:        General: No tenderness.     Cervical back: Normal range of motion and neck supple.  Skin:    General: Skin is warm and dry.  Neurological:     Mental Status: He is alert and oriented to person, place, and time.     Labs reviewed: Basic Metabolic Panel: Recent Labs    08/21/19 0000  NA 139  K 4.3  CL 106  CO2 25*  BUN 22*  CREATININE 0.8  CALCIUM 9.7   Liver Function Tests: Recent Labs    08/21/19 0000  AST 21  ALT 21  ALKPHOS 63  ALBUMIN 4.3   No results for input(s): LIPASE, AMYLASE in the last 8760 hours. No results for input(s): AMMONIA in the last 8760 hours. CBC: Recent Labs    08/21/19 0000  WBC 5.3  NEUTROABS 2,337  HGB 16.1  HCT 45  PLT 122*   Lipid Panel: Recent Labs    08/21/19 0000  CHOL 162  HDL 38  LDLCALC 103  TRIG 118   TSH: No results for input(s): TSH in the last 8760 hours. A1C: No results found for: HGBA1C   Assessment/Plan 1. Benign prostatic hyperplasia with urinary frequency -stable on curren regimen. - tamsulosin (FLOMAX) 0.4 MG CAPS capsule; Take 1 capsule (0.4 mg total) by mouth daily.  Dispense: 90 capsule; Refill: 3 - finasteride (PROSCAR) 5 MG tablet; Take 1 tablet (5 mg total) by mouth daily.  Dispense: 90 tablet; Refill: 3 - albuterol (PROVENTIL) (2.5 MG/3ML) 0.083% nebulizer solution; Take 3 mLs (2.5 mg total) by nebulization as needed for wheezing or shortness of breath.  Dispense: 75 mL; Refill: 3  2. Primary insomnia -stable on temazepam - temazepam (RESTORIL) 15 MG capsule; Take 1 capsule (15 mg total) by mouth at bedtime as needed for sleep.  Dispense: 30 capsule; Refill: 3  3. Chronic obstructive pulmonary disease, unspecified COPD type  ([place]) -improved controlled on spiriva - Tiotropium Bromide Monohydrate (SPIRIVA RESPIMAT) 2.5 MCG/ACT AERS; Inhale 2 puffs into the lungs daily.  Dispense: 12 g; Refill: 2 - albuterol (VENTOLIN HFA) 108 (90 Base) MCG/ACT inhaler; Inhale 2 puffs into the lungs every 4 (four) hours as needed for wheezing or shortness of breath.  Dispense: 6.7 g; Refill: 3  4. Mixed hyperlipidemia LDL stable on zocor 40 mg daily, encouraged to continue dietary modifications. - simvastatin (ZOCOR) 40 MG tablet; Take 1 tablet (40 mg total) by mouth daily.  Dispense: 90 tablet; Refill: 3  5. Primary osteoarthritis of both knees -controlled with nabumetone, reviewed risk of kidney disease with use of NSAIDS, encouraged to use minimally. Will monitor renal function, stable on last labs.  - nabumetone (RELAFEN) 750 MG tablet; Take 2 tablets (1,500 mg total) by mouth daily. 2 tablets to = 1500 mg  Dispense: 180 tablet; Refill: 1  6. Erectile dysfunction, unspecified erectile dysfunction type - tadalafil (CIALIS) 20 MG tablet; Take 1 tablet (20 mg total) by mouth daily as needed for erectile dysfunction.  Dispense: 20 tablet; Refill: 0  7. Muscle cramps -using flexeril as need for muscle cramps/spasms. No adverse effects noted. Encouraged to use sparely and reviewed risk of adverse effects.  - cyclobenzaprine (FLEXERIL) 10 MG tablet; Take 1 tablet (10 mg total) by mouth 2 (two) times daily as needed for muscle spasms.  Dispense: 60 tablet; Refill:  1  Next appt: 01/16/2020 as scheduled.  Carlos American. Rebecca, [facility] [phone]

## 2019-11-22 ENCOUNTER — Telehealth: Payer: Self-pay | Admitting: *Deleted

## 2019-11-22 DIAGNOSIS — N401 Enlarged prostate with lower urinary tract symptoms: Secondary | ICD-10-CM

## 2019-11-22 DIAGNOSIS — R350 Frequency of micturition: Secondary | ICD-10-CM

## 2019-11-22 MED ORDER — ALBUTEROL SULFATE (2.5 MG/3ML) 0.083% IN NEBU: 2.5000 mg | INHALATION_SOLUTION | Freq: Every day | RESPIRATORY_TRACT | 3 refills | Status: DC | PRN

## 2019-11-22 NOTE — Telephone Encounter (Signed)
Updated and refaxed

## 2019-11-22 NOTE — Telephone Encounter (Signed)
Received fax from [facility] [phone] for Medication Clarification. Stated: Rx for Albuterol 2.5mg /66ml [employer] is missing dosing frequency.   Please Advise.

## 2019-11-23 ENCOUNTER — Other Ambulatory Visit: Payer: Self-pay

## 2019-11-23 DIAGNOSIS — J302 Other seasonal allergic rhinitis: Secondary | ICD-10-CM

## 2019-11-23 MED ORDER — FLUTICASONE PROPIONATE 50 MCG/ACT NA SUSP: 1.0000 | Freq: Every day | NASAL | 3 refills | Status: DC

## 2019-11-23 NOTE — Telephone Encounter (Signed)
Patient called requesting a refill be sent to [facility] mail order pharmacy.

## 2020-01-11 ENCOUNTER — Other Ambulatory Visit: Payer: Self-pay

## 2020-01-11 ENCOUNTER — Ambulatory Visit (INDEPENDENT_AMBULATORY_CARE_PROVIDER_SITE_OTHER): Payer: Medicare PPO | Admitting: Nurse Practitioner

## 2020-01-11 ENCOUNTER — Telehealth: Payer: Self-pay

## 2020-01-11 DIAGNOSIS — R1310 Dysphagia, unspecified: Secondary | ICD-10-CM

## 2020-01-11 NOTE — Telephone Encounter (Signed)
Mr. Joshua Mercado, Joshua Mercado are scheduled for a virtual visit with your provider today.    Just as we do with appointments in the office, we must obtain your consent to participate.  Your consent will be active for this visit and any virtual visit you may have with one of our providers in the next 365 days.    If you have a MyChart account, I can also send a copy of this consent to you electronically.  All virtual visits are billed to your insurance company just like a traditional visit in the office.  As this is a virtual visit, video technology does not allow for your provider to perform a traditional examination.  This may limit your provider's ability to fully assess your condition.  If your provider identifies any concerns that need to be evaluated in person or the need to arrange testing such as labs, EKG, etc, we will make arrangements to do so.    Although advances in technology are sophisticated, we cannot ensure that it will always work on either your end or our end.  If the connection with a video visit is poor, we may have to switch to a telephone visit.  With either a video or telephone visit, we are not always able to ensure that we have a secure connection.   I need to obtain your verbal consent now.   Are you willing to proceed with your visit today?   Joshua Mercado has provided verbal consent on 01/11/2020 for a virtual visit (video or telephone).   [name], CMA 01/11/2020  1:46 PM

## 2020-01-11 NOTE — Progress Notes (Signed)
This service is provided via telemedicine  No vital signs collected/recorded due to the encounter was a telemedicine visit.   Location of patient (ex: home, work):  Home  Patient consents to a telephone visit:  Yes, see encounter dated 07/01/20221  Location of the provider (ex: office, home):  [facility]  Name of any referring provider:  N/A  Names of all persons participating in the telemedicine service and their role in the encounter:  Sherrie Mustache, Nurse Practitioner, [name], CMA, and patient.   Time spent on call:  6 minutes with medical assistant      Careteam: Patient Care Team: Lauree Chandler, NP as PCP - General (Geriatric Medicine) Kate Sable, MD as PCP - Cardiology (Cardiology)  PLACE OF SERVICE:  [place]  Advanced Directive information    Allergies  Allergen Reactions  . Bee Venom     Throat/mouth swelling    Chief Complaint  Patient presents with  . Acute Visit    Persistent cough. Patient states that when he eats certain stuff it seems to get stuck in his throat and he coughs about 5 minutes to get it up. [name] would like referral to ENT near [place] Merc[place].     HPI: Patient is a 74 y.o. male due to concerns when eating. Reports when he eats certain things he has trouble swallowing it. Feels like it gets stuck in his throat.  He has been having more issues with this in the last 3 months. Wants to have an ENT to look at his throat.  Does not have the sensation of food getting stuck on the way down, just in his throat. When he drinks water he feels like it will get stuck.  No fever, worsening shortness of breath, worsening cough or congestion noted.  No sore throat or swelling noted in throat.  No worsening GERD  Review of Systems:  Review of Systems  Constitutional: Negative for chills and fever.  HENT: Negative for hearing loss and nosebleeds.        Trouble swallowing  Respiratory: Positive for  cough. Negative for sputum production and shortness of breath.   Gastrointestinal: Negative for heartburn.    Past Medical History:  Diagnosis Date  . Arthritis    Knees  . COPD (chronic obstructive pulmonary disease) ([place])   . Does use hearing aid    Bilateral  . Enlarged prostate   . Hypercholesterolemia    Past Surgical History:  Procedure Laterality Date  . COLONOSCOPY  2017   Dr. Madolyn Frieze   Social History:   reports that he quit smoking about 41 years ago. His smoking use included cigarettes. He has a 35.00 pack-year smoking history. He has never used smokeless tobacco. He reports previous alcohol use. He reports that he does not use drugs.  Family History  Problem Relation Age of Onset  . Alzheimer's disease Mother   . Diabetes Mother   . Heart failure Father   . Heart attack Father   . Diabetes Father   . Dementia Father     Medications: Patient's Medications  New Prescriptions   No medications on file  Previous Medications   ACETAMINOPHEN (TYLENOL) 500 MG TABLET    Take 500 mg by mouth as needed.   ALBUTEROL (PROVENTIL) (2.5 MG/3ML) 0.083% NEBULIZER SOLUTION    Take 3 mLs (2.5 mg total) by nebulization daily as needed for wheezing or shortness of breath.   ALBUTEROL (VENTOLIN HFA) 108 (90 BASE) MCG/ACT INHALER  Inhale 2 puffs into the lungs every 4 (four) hours as needed for wheezing or shortness of breath.   APOAEQUORIN (PREVAGEN) 10 MG CAPS    Take 1 capsule by mouth daily.    ASCORBIC ACID (VITAMIN C) 1000 MG TABLET    Take 1,000 mg by mouth daily.   ASPIRIN EC 81 MG TABLET    Take 81 mg by mouth daily.   B COMPLEX-C (SUPER B-COMPLEX + VITAMIN C PO)    Take by mouth.   BIOTIN 1000 MCG TABLET    Take 1,000 mcg by mouth daily.    CALCIUM ELEMENTAL AS CARBONATE (BARIATRIC TUMS ULTRA) 400 MG CHEWABLE TABLET    Chew 1,000 mg by mouth daily as needed.    CETAPHIL (CETAPHIL) CREAM    Apply topically as needed.   COD LIVER OIL/VITAMINS A & D CAPS    Take by mouth.    CYCLOBENZAPRINE (FLEXERIL) 10 MG TABLET    Take 1 tablet (10 mg total) by mouth 2 (two) times daily as needed for muscle spasms.   FAMOTIDINE (PEPCID) 20 MG TABLET    Take 20 mg by mouth.   FINASTERIDE (PROSCAR) 5 MG TABLET    Take 1 tablet (5 mg total) by mouth daily.   FLUTICASONE (FLONASE) 50 MCG/ACT NASAL SPRAY    Place 1 spray into both nostrils daily.   GARLIC 4627 MG CAPS    Take by mouth.   LORATADINE (CLARITIN) 10 MG TABLET    Take 1 tablet (10 mg total) by mouth daily.   MELATONIN 3 MG TABS    Take 1 tablet by mouth at bedtime.   MILK THISTLE 175 MG TABLET    Take 175 mg by mouth daily.   MISC NATURAL PRODUCTS (OSTEO BI-FLEX ADV TRIPLE ST) TABS    Take by mouth.   MULTIPLE VITAMINS-MINERALS (ONE DAILY MENS HEALTH) TABS    Take by mouth.   NABUMETONE (RELAFEN) 750 MG TABLET    Take 2 tablets (1,500 mg total) by mouth daily. 2 tablets to = 1500 mg   OMEGA-3 1000 MG CAPS    Take by mouth.   SAW PALMETTO 450 MG CAPS    Take by mouth.   SELENIUM 200 MCG TABS TABLET    Take by mouth daily.   SIMVASTATIN (ZOCOR) 40 MG TABLET    Take 1 tablet (40 mg total) by mouth daily.   TADALAFIL (CIALIS) 20 MG TABLET    Take 1 tablet (20 mg total) by mouth daily as needed for erectile dysfunction.   TAMSULOSIN (FLOMAX) 0.4 MG CAPS CAPSULE    Take 1 capsule (0.4 mg total) by mouth daily.   TEMAZEPAM (RESTORIL) 15 MG CAPSULE    Take 1 capsule (15 mg total) by mouth at bedtime as needed for sleep.   TIOTROPIUM BROMIDE MONOHYDRATE (SPIRIVA RESPIMAT) 2.5 MCG/ACT AERS    Inhale 2 puffs into the lungs daily.   VITAMIN B-12 (CYANOCOBALAMIN) 500 MCG TABLET    Take 500 mcg by mouth daily.   VITAMIN E 180 MG CAPS    Take by mouth.  Modified Medications   No medications on file  Discontinued Medications   No medications on file    Physical Exam:  There were no vitals filed for this visit. There is no height or weight on file to calculate BMI. Wt Readings from Last 3 Encounters:  11/21/19 212 lb (96.2 kg)   09/28/19 215 lb (97.5 kg)  09/19/19 215 lb 3.2 oz (97.6 kg)  Labs reviewed: Basic Metabolic Panel: Recent Labs    08/21/19 0000  NA 139  K 4.3  CL 106  CO2 25*  BUN 22*  CREATININE 0.8  CALCIUM 9.7   Liver Function Tests: Recent Labs    08/21/19 0000  AST 21  ALT 21  ALKPHOS 63  ALBUMIN 4.3   No results for input(s): LIPASE, AMYLASE in the last 8760 hours. No results for input(s): AMMONIA in the last 8760 hours. CBC: Recent Labs    08/21/19 0000  WBC 5.3  NEUTROABS 2,337  HGB 16.1  HCT 45  PLT 122*   Lipid Panel: Recent Labs    08/21/19 0000  CHOL 162  HDL 38  LDLCALC 103  TRIG 118   TSH: No results for input(s): TSH in the last 8760 hours. A1C: No results found for: HGBA1C   Assessment/Plan 1. Dysphagia, unspecified type -trouble swallowing foods with a "flakey texture" and feeling of foods getting stuck high in his throat causing cough. reports water does not help bring the food down, request ENT referral. Discussed evaluation with ST and swallow study but would like to see what the ENT advises first.  - Ambulatory referral to ENT  Next appt:01/16/2020 Duston Smolenski K. Harle Battiest  [facility] [phone]   Virtual Visit via Video Note  I connected with Joshua Mercado on 01/11/20 at  2:15 PM EDT by a video enabled telemedicine application and verified that I am speaking with the correct person using two identifiers.    I discussed the limitations of evaluation and management by telemedicine and the availability of in person appointments. The patient expressed understanding and agreed to proceed.    I discussed the assessment and treatment plan with the patient. The patient was provided an opportunity to ask questions and all were answered. The patient agreed with the plan and demonstrated an understanding of the instructions.   The patient was advised to call back or seek an in-person evaluation if the symptoms worsen  or if the condition fails to improve as anticipated.  I provided 15 minutes of non-face-to-face time during this encounter.  Carlos American. Dewaine Oats, AGNP Avs printed and mailed.

## 2020-01-16 ENCOUNTER — Other Ambulatory Visit: Payer: Self-pay

## 2020-01-16 ENCOUNTER — Ambulatory Visit: Payer: Self-pay | Admitting: Nurse Practitioner

## 2020-01-16 ENCOUNTER — Ambulatory Visit (INDEPENDENT_AMBULATORY_CARE_PROVIDER_SITE_OTHER): Payer: Medicare PPO | Admitting: Nurse Practitioner

## 2020-01-16 ENCOUNTER — Encounter: Payer: Self-pay | Admitting: Nurse Practitioner

## 2020-01-16 DIAGNOSIS — Z0000 Encounter for general adult medical examination without abnormal findings: Secondary | ICD-10-CM

## 2020-01-16 NOTE — Progress Notes (Signed)
Subjective:   Joshua Mercado is a 74 y.o. male who presents for Medicare Annual/Subsequent preventive examination.  Review of Systems     Cardiac Risk Factors include: advanced age (>34men, >102 women);dyslipidemia;male gender     Objective:    There were no vitals filed for this visit. There is no height or weight on file to calculate BMI.  Advanced Directives 01/16/2020 08/10/2019 05/23/2019  Does Patient Have a Medical Advance Directive? No No;Yes Yes  Type of Advance Directive - Living will;Healthcare Power of [place];Out of facility DNR (pink MOST or yellow form) Living will  Does patient want to make changes to medical advance directive? - No - Patient declined No - Patient declined  Would patient like information on creating a medical advance directive? Yes (MAU/Ambulatory/Procedural Areas - Information given) - -  Pre-existing out of facility DNR order (yellow form or pink MOST form) - Pink MOST/Yellow Form most recent copy in chart - Physician notified to receive inpatient order -    Current Medications (verified) Outpatient Encounter Medications as of 01/16/2020  Medication Sig  . acetaminophen (TYLENOL) 500 MG tablet Take 500 mg by mouth as needed.  Marland Kitchen albuterol (PROVENTIL) (2.5 MG/3ML) 0.083% nebulizer solution Take 3 mLs (2.5 mg total) by nebulization daily as needed for wheezing or shortness of breath.  Marland Kitchen albuterol (VENTOLIN HFA) 108 (90 Base) MCG/ACT inhaler Inhale 2 puffs into the lungs every 4 (four) hours as needed for wheezing or shortness of breath.  . Apoaequorin (PREVAGEN) 10 MG CAPS Take 1 capsule by mouth daily.   . Ascorbic Acid (VITAMIN C) 1000 MG tablet Take 1,000 mg by mouth daily.  Marland Kitchen aspirin EC 81 MG tablet Take 81 mg by mouth daily.  . B Complex-C (SUPER B-COMPLEX + VITAMIN C PO) Take by mouth.  . Biotin 1000 MCG tablet Take 1,000 mcg by mouth daily.   . calcium elemental as carbonate (BARIATRIC TUMS ULTRA) 400 MG chewable tablet Chew 1,000 mg by mouth daily as  needed.   . cetaphil (CETAPHIL) cream Apply topically as needed.  Marland Kitchen Cod Liver Oil/Vitamins A & D CAPS Take by mouth.  . cyclobenzaprine (FLEXERIL) 10 MG tablet Take 1 tablet (10 mg total) by mouth 2 (two) times daily as needed for muscle spasms.  . famotidine (PEPCID) 20 MG tablet Take 20 mg by mouth.  . finasteride (PROSCAR) 5 MG tablet Take 1 tablet (5 mg total) by mouth daily.  . fluticasone (FLONASE) 50 MCG/ACT nasal spray Place 1 spray into both nostrils daily.  . Garlic 4765 MG CAPS Take by mouth.  . loratadine (CLARITIN) 10 MG tablet Take 1 tablet (10 mg total) by mouth daily.  . Melatonin 3 MG TABS Take 1 tablet by mouth at bedtime.  . Methylsulfonylmethane (MSM) 1000 MG CAPS Take 1 capsule by mouth 2 (two) times daily.  . milk thistle 175 MG tablet Take 175 mg by mouth daily.  . Misc Natural Products (OSTEO BI-FLEX ADV TRIPLE ST) TABS Take by mouth.  . Multiple Vitamins-Minerals (ONE DAILY MENS HEALTH) TABS Take by mouth.  . nabumetone (RELAFEN) 750 MG tablet Take 2 tablets (1,500 mg total) by mouth daily. 2 tablets to = 1500 mg  . Omega-3 1000 MG CAPS Take by mouth.  . Saw Palmetto 450 MG CAPS Take by mouth.  . selenium 200 MCG TABS tablet Take by mouth daily.  . simvastatin (ZOCOR) 40 MG tablet Take 1 tablet (40 mg total) by mouth daily.  . tadalafil (CIALIS) 20 MG tablet  Take 1 tablet (20 mg total) by mouth daily as needed for erectile dysfunction.  . tamsulosin (FLOMAX) 0.4 MG CAPS capsule Take 1 capsule (0.4 mg total) by mouth daily.  . temazepam (RESTORIL) 15 MG capsule Take 1 capsule (15 mg total) by mouth at bedtime as needed for sleep.  . Tiotropium Bromide Monohydrate (SPIRIVA RESPIMAT) 2.5 MCG/ACT AERS Inhale 2 puffs into the lungs daily.  . vitamin B-12 (CYANOCOBALAMIN) 500 MCG tablet Take 500 mcg by mouth daily.  . Vitamin E 180 MG CAPS Take by mouth.  . [DISCONTINUED] Methylsulfonylmethane (MSM) 1000 MG CAPS Take by mouth.   No facility-administered encounter  medications on file as of 01/16/2020.    Allergies (verified) Bee venom   History: Past Medical History:  Diagnosis Date  . Arthritis    Knees  . COPD (chronic obstructive pulmonary disease) ([place])   . Does use hearing aid    Bilateral  . Enlarged prostate   . Hypercholesterolemia    Past Surgical History:  Procedure Laterality Date  . COLONOSCOPY  2017   Dr. Madolyn Frieze   Family History  Problem Relation Age of Onset  . Alzheimer's disease Mother   . Diabetes Mother   . Heart failure Father   . Heart attack Father   . Diabetes Father   . Dementia Father    Social History   Socioeconomic History  . Marital status: Unknown    Spouse name: Not on file  . Number of children: Not on file  . Years of education: Not on file  . Highest education level: Not on file  Occupational History  . Not on file  Tobacco Use  . Smoking status: Former Smoker    Packs/day: 1.00    Years: 35.00    Pack years: 35.00    Types: Cigarettes    Quit date: 1980    Years since quitting: 41.5  . Smokeless tobacco: Never Used  Substance and Sexual Activity  . Alcohol use: Not Currently  . Drug use: Never  . Sexual activity: Not on file  Other Topics Concern  . Not on file  Social History Narrative   No pets.  Clinical biochemist.  Trade school education.  Wife is a retired Engineer, drilling.    Social Determinants of Health   Financial Resource Strain:   . Difficulty of Paying Living Expenses:   Food Insecurity:   . Worried About Charity fundraiser in the Last Year:   . Arboriculturist in the Last Year:   Transportation Needs:   . Film/video editor (Medical):   Marland Kitchen Lack of Transportation (Non-Medical):   Physical Activity:   . Days of Exercise per Week:   . Minutes of Exercise per Session:   Stress:   . Feeling of Stress :   Social Connections:   . Frequency of Communication with Friends and Family:   . Frequency of Social Gatherings with Friends and Family:   . Attends Religious Services:     . Active Member of Clubs or Organizations:   . Attends Archivist Meetings:   Marland Kitchen Marital Status:     Tobacco Counseling Counseling given: Not Answered   Clinical Intake:  Pre-visit preparation completed: Yes  Pain : No/denies pain     BMI - recorded: 28 Nutritional Status: BMI 25 -29 Overweight Diabetes: No  How often do you need to have someone help you when you read instructions, pamphlets, or other written materials from your doctor or pharmacy?: 1 -  Never  Diabetic?no         Activities of Daily Living In your present state of health, do you have any difficulty performing the following activities: 01/16/2020  Hearing? Y  Comment wears hearing aides  Vision? N  Difficulty concentrating or making decisions? N  Walking or climbing stairs? Y  Comment walking stairs due to knee pain  Dressing or bathing? N  Doing errands, shopping? N  Preparing Food and eating ? N  Using the Toilet? N  In the past six months, have you accidently leaked urine? N  Do you have problems with loss of bowel control? N  Managing your Medications? N  Managing your Finances? N  Housekeeping or managing your Housekeeping? N    Patient Care Team: Lauree Chandler, NP as PCP - General (Geriatric Medicine) Kate Sable, MD as PCP - Cardiology (Cardiology)  Indicate any recent Medical Services you may have received from other than [facility] providers in the past year (date may be approximate).     Assessment:   This is a routine wellness examination for [facility].  Hearing/Vision screen  Hearing Screening   125Hz  250Hz  500Hz  1000Hz  2000Hz  3000Hz  4000Hz  6000Hz  8000Hz   Right ear:           Left ear:           Comments: Patient states that he wears hearing aids  Vision Screening Comments: Patient wears glasses. Patient has not had a recent eye exam  Dietary issues and exercise activities discussed: Current Exercise Habits: Home exercise routine, Type of exercise: strength  training/weights;Other - see comments (cardio), Time (Minutes): 45, Frequency (Times/Week): 5, Weekly Exercise (Minutes/Week): 225  Goals    . Patient Stated     To improve pulmonary function      Depression Screen PHQ 2/9 Scores 01/16/2020 11/21/2019  PHQ - 2 Score 0 0    Fall Risk Fall Risk  01/16/2020 11/21/2019 08/10/2019  Falls in the past year? 0 0 0  Number falls in past yr: 0 0 0  Injury with Fall? 0 0 0    Any stairs in or around the home? Yes  If so, are there any without handrails? Yes  Home free of loose throw rugs in walkways, pet beds, electrical cords, etc? Yes  Adequate lighting in your home to reduce risk of falls? Yes   ASSISTIVE DEVICES UTILIZED TO PREVENT FALLS:  Life alert? No  Use of a cane, walker or w/c? No  Grab bars in the bathroom? Yes  Shower chair or bench in shower? No  Elevated toilet seat or a handicapped toilet? Yes   TIMED UP AND GO:na Cognitive Function:     6CIT Screen 01/16/2020  What Year? 0 points  What month? 0 points  What time? 0 points  Count back from 20 0 points  Months in reverse 0 points  Repeat phrase 2 points  Total Score 2    Immunizations Immunization History  Administered Date(s) Administered  . Influenza-Unspecified 05/14/2019  . Moderna SARS-COVID-2 Vaccination 08/15/2018, 09/13/2019  . Pneumococcal Conjugate-13 04/13/2015  . Pneumococcal Polysaccharide-23 11/24/2012  . Zoster 06/13/2007    TDAP status: Due, Education has been provided regarding the importance of this vaccine. Advised may receive this vaccine at local pharmacy or Health Dept. Aware to provide a copy of the vaccination record if obtained from local pharmacy or Health Dept. Verbalized acceptance and understanding. Flu Vaccine status: Up to date Pneumococcal vaccine status: Up to date Covid-19 vaccine status: Completed vaccines  Qualifies for Shingles Vaccine? Yes   Zostavax completed Yes   Shingrix Completed?: No.    Education has been provided  regarding the importance of this vaccine. Patient has been advised to call insurance company to determine out of pocket expense if they have not yet received this vaccine. Advised may also receive vaccine at local pharmacy or Health Dept. Verbalized acceptance and understanding.  Screening Tests Health Maintenance  Topic Date Due  . Hepatitis C Screening  Never done  . COLONOSCOPY  Never done  . TETANUS/TDAP  Never done  . INFLUENZA VACCINE  02/11/2020  . COVID-19 Vaccine  Completed  . PNA vac Low Risk Adult  Completed    Health Maintenance  Health Maintenance Due  Topic Date Due  . Hepatitis C Screening  Never done  . COLONOSCOPY  Never done  . TETANUS/TDAP  Never done    Colorectal, due after 5 years, has been 3 years.   Lung Cancer Screening: (Low Dose CT Chest recommended if Age 83-80 years, 30 pack-year currently smoking OR have quit w/in 15years.) does not qualify.   Lung Cancer Screening Referral: na  Additional Screening:  Hepatitis C Screening: does qualify; Completed: 8 years ago.   Vision Screening: Recommended annual ophthalmology exams for early detection of glaucoma and other disorders of the eye. Is the patient up to date with their annual eye exam?  No  Who is the provider or what is the name of the office in which the patient attends annual eye exams? Unknown, but has a new provider  If pt is not established with a provider, would they like to be referred to a provider to establish care? No .   Dental Screening: Recommended annual dental exams for proper oral hygiene  Community Resource Referral / Chronic Care Management: CRR required this visit?  No   CCM required this visit?  No      Plan:     I have personally reviewed and noted the following in the patient's chart:   . Medical and social history . Use of alcohol, tobacco or illicit drugs  . Current medications and supplements . Functional ability and status . Nutritional status . Physical  activity . Advanced directives . List of other physicians . Hospitalizations, surgeries, and ER visits in previous 12 months . Vitals . Screenings to include cognitive, depression, and falls . Referrals and appointments  In addition, I have reviewed and discussed with patient certain preventive protocols, quality metrics, and best practice recommendations. A written personalized care plan for preventive services as well as general preventive health recommendations were provided to patient.     Lauree Chandler, NP   01/16/2020

## 2020-01-16 NOTE — Patient Instructions (Signed)
Joshua Mercado , Thank you for taking time to come for your Medicare Wellness Visit. I appreciate your ongoing commitment to your health goals. Please review the following plan we discussed and let me know if I can assist you in the future.   Screening recommendations/referrals: Colonoscopy up to date- due in 2-3 years Recommended yearly ophthalmology/optometry visit for glaucoma screening and checkup Recommended yearly dental visit for hygiene and checkup  Vaccinations: Influenza vaccine up to date Pneumococcal vaccine up to date Tdap vaccine DUE- request to have at your local pharmacy. Shingles vaccine DUE- request to have at your local pharmacy    Advanced directives: MOST form completed today, to complete living will and return to office  Conditions/risks identified: advanced age, COPD, hyperlipidemia  Next appointment: 1 year  Preventive Care 76 Years and Older, Male Preventive care refers to lifestyle choices and visits with your health care provider that can promote health and wellness. What does preventive care include?  A yearly physical exam. This is also called an annual well check.  Dental exams once or twice a year.  Routine eye exams. Ask your health care provider how often you should have your eyes checked.  Personal lifestyle choices, including:  Daily care of your teeth and gums.  Regular physical activity.  Eating a healthy diet.  Avoiding tobacco and drug use.  Limiting alcohol use.  Practicing safe sex.  Taking low doses of aspirin every day.  Taking vitamin and mineral supplements as recommended by your health care provider. What happens during an annual well check? The services and screenings done by your health care provider during your annual well check will depend on your age, overall health, lifestyle risk factors, and family history of disease. Counseling  Your health care provider may ask you questions about your:  Alcohol use.  Tobacco  use.  Drug use.  Emotional well-being.  Home and relationship well-being.  Sexual activity.  Eating habits.  History of falls.  Memory and ability to understand (cognition).  Work and work Statistician. Screening  You may have the following tests or measurements:  Height, weight, and BMI.  Blood pressure.  Lipid and cholesterol levels. These may be checked every 5 years, or more frequently if you are over 18 years old.  Skin check.  Lung cancer screening. You may have this screening every year starting at age 76 if you have a 30-pack-year history of smoking and currently smoke or have quit within the past 15 years.  Fecal occult blood test (FOBT) of the stool. You may have this test every year starting at age 81.  Flexible sigmoidoscopy or colonoscopy. You may have a sigmoidoscopy every 5 years or a colonoscopy every 10 years starting at age 25.  Prostate cancer screening. Recommendations will vary depending on your family history and other risks.  Hepatitis C blood test.  Hepatitis B blood test.  Sexually transmitted disease (STD) testing.  Diabetes screening. This is done by checking your blood sugar (glucose) after you have not eaten for a while (fasting). You may have this done every 1-3 years.  Abdominal aortic aneurysm (AAA) screening. You may need this if you are a current or former smoker.  Osteoporosis. You may be screened starting at age 76 if you are at high risk. Talk with your health care provider about your test results, treatment options, and if necessary, the need for more tests. Vaccines  Your health care provider may recommend certain vaccines, such as:  Influenza vaccine. This is  recommended every year.  Tetanus, diphtheria, and acellular pertussis (Tdap, Td) vaccine. You may need a Td booster every 10 years.  Zoster vaccine. You may need this after age 38.  Pneumococcal 13-valent conjugate (PCV13) vaccine. One dose is recommended after age  76.  Pneumococcal polysaccharide (PPSV23) vaccine. One dose is recommended after age 50. Talk to your health care provider about which screenings and vaccines you need and how often you need them. This information is not intended to replace advice given to you by your health care provider. Make sure you discuss any questions you have with your health care provider. Document Released: 07/26/2015 Document Revised: 03/18/2016 Document Reviewed: 04/30/2015 Elsevier Interactive Patient Education  2017 [place] Prevention in the Home Falls can cause injuries. They can happen to people of all ages. There are many things you can do to make your home safe and to help prevent falls. What can I do on the outside of my home?  Regularly fix the edges of walkways and driveways and fix any cracks.  Remove anything that might make you trip as you walk through a door, such as a raised step or threshold.  Trim any bushes or trees on the path to your home.  Use bright outdoor lighting.  Clear any walking paths of anything that might make someone trip, such as rocks or tools.  Regularly check to see if handrails are loose or broken. Make sure that both sides of any steps have handrails.  Any raised decks and porches should have guardrails on the edges.  Have any leaves, snow, or ice cleared regularly.  Use sand or salt on walking paths during winter.  Clean up any spills in your garage right away. This includes oil or grease spills. What can I do in the bathroom?  Use night lights.  Install grab bars by the toilet and in the tub and shower. Do not use towel bars as grab bars.  Use non-skid mats or decals in the tub or shower.  If you need to sit down in the shower, use a plastic, non-slip stool.  Keep the floor dry. Clean up any water that spills on the floor as soon as it happens.  Remove soap buildup in the tub or shower regularly.  Attach bath mats securely with double-sided  non-slip rug tape.  Do not have throw rugs and other things on the floor that can make you trip. What can I do in the bedroom?  Use night lights.  Make sure that you have a light by your bed that is easy to reach.  Do not use any sheets or blankets that are too big for your bed. They should not hang down onto the floor.  Have a firm chair that has side arms. You can use this for support while you get dressed.  Do not have throw rugs and other things on the floor that can make you trip. What can I do in the kitchen?  Clean up any spills right away.  Avoid walking on wet floors.  Keep items that you use a lot in easy-to-reach places.  If you need to reach something above you, use a strong step stool that has a grab bar.  Keep electrical cords out of the way.  Do not use floor polish or wax that makes floors slippery. If you must use wax, use non-skid floor wax.  Do not have throw rugs and other things on the floor that can make you trip.  What can I do with my stairs?  Do not leave any items on the stairs.  Make sure that there are handrails on both sides of the stairs and use them. Fix handrails that are broken or loose. Make sure that handrails are as long as the stairways.  Check any carpeting to make sure that it is firmly attached to the stairs. Fix any carpet that is loose or worn.  Avoid having throw rugs at the top or bottom of the stairs. If you do have throw rugs, attach them to the floor with carpet tape.  Make sure that you have a light switch at the top of the stairs and the bottom of the stairs. If you do not have them, ask someone to add them for you. What else can I do to help prevent falls?  Wear shoes that:  Do not have high heels.  Have rubber bottoms.  Are comfortable and fit you well.  Are closed at the toe. Do not wear sandals.  If you use a stepladder:  Make sure that it is fully opened. Do not climb a closed stepladder.  Make sure that both  sides of the stepladder are locked into place.  Ask someone to hold it for you, if possible.  Clearly mark and make sure that you can see:  Any grab bars or handrails.  First and last steps.  Where the edge of each step is.  Use tools that help you move around (mobility aids) if they are needed. These include:  Canes.  Walkers.  Scooters.  Crutches.  Turn on the lights when you go into a dark area. Replace any light bulbs as soon as they burn out.  Set up your furniture so you have a clear path. Avoid moving your furniture around.  If any of your floors are uneven, fix them.  If there are any pets around you, be aware of where they are.  Review your medicines with your doctor. Some medicines can make you feel dizzy. This can increase your chance of falling. Ask your doctor what other things that you can do to help prevent falls. This information is not intended to replace advice given to you by your health care provider. Make sure you discuss any questions you have with your health care provider. Document Released: 04/25/2009 Document Revised: 12/05/2015 Document Reviewed: 08/03/2014 Elsevier Interactive Patient Education  2017 [employer].

## 2020-01-16 NOTE — Telephone Encounter (Signed)
This encounter was created in error - please disregard.

## 2020-01-16 NOTE — Progress Notes (Signed)
This service is provided via telemedicine  No vital signs collected/recorded due to the encounter was a telemedicine visit.   Location of patient (ex: home, work): Home  Patient consents to a telephone visit:  Yes, see telephone encounter dated 01/11/2020  Location of the provider (ex: office, home):  [place]  Name of any referring provider:  N/A  Names of all persons participating in the telemedicine service and their role in the encounter:  Sherrie Mustache, Nurse Practitioner, [name], CMA, and patient.   Time spent on call:  8 minutes with medical assistant

## 2020-01-18 ENCOUNTER — Ambulatory Visit (INDEPENDENT_AMBULATORY_CARE_PROVIDER_SITE_OTHER): Payer: Medicare PPO | Admitting: Nurse Practitioner

## 2020-01-18 ENCOUNTER — Encounter: Payer: Self-pay | Admitting: Nurse Practitioner

## 2020-01-18 ENCOUNTER — Other Ambulatory Visit: Payer: Self-pay

## 2020-01-18 VITALS — BP 140/90 | HR 56 | Temp 97.8°F | Ht 72.0 in | Wt 213.0 lb

## 2020-01-18 DIAGNOSIS — I10 Essential (primary) hypertension: Secondary | ICD-10-CM | POA: Diagnosis not present

## 2020-01-18 DIAGNOSIS — Z0000 Encounter for general adult medical examination without abnormal findings: Secondary | ICD-10-CM

## 2020-01-18 DIAGNOSIS — Z8042 Family history of malignant neoplasm of prostate: Secondary | ICD-10-CM | POA: Diagnosis not present

## 2020-01-18 DIAGNOSIS — N401 Enlarged prostate with lower urinary tract symptoms: Secondary | ICD-10-CM | POA: Diagnosis not present

## 2020-01-18 DIAGNOSIS — J449 Chronic obstructive pulmonary disease, unspecified: Secondary | ICD-10-CM | POA: Diagnosis not present

## 2020-01-18 DIAGNOSIS — R350 Frequency of micturition: Secondary | ICD-10-CM

## 2020-01-18 DIAGNOSIS — E782 Mixed hyperlipidemia: Secondary | ICD-10-CM

## 2020-01-18 NOTE — Progress Notes (Signed)
Provider: Lauree Chandler, NP  Patient Care Team: Lauree Chandler, NP as PCP - General (Geriatric Medicine) Kate Sable, MD as PCP - Cardiology (Cardiology)  Extended Emergency Contact Information Primary Emergency Contact: Salome Arnt Mobile Phone: [phone] Relation: Spouse Interpreter needed? No Allergies  Allergen Reactions  . Bee Venom     Throat/mouth swelling   Code Status: FULL Goals of Care: Advanced Directive information Advanced Directives 01/18/2020  Does Patient Have a Medical Advance Directive? Yes  Type of Advance Directive Out of facility DNR (pink MOST or yellow form)  Does patient want to make changes to medical advance directive? No - Patient declined  Would patient like information on creating a medical advance directive? -  Pre-existing out of facility DNR order (yellow form or pink MOST form) Pink MOST form placed in chart (order not valid for inpatient use)     Chief Complaint  Patient presents with  . Annual Exam    Annual physical,  . Best Practice Recommendations    Hepatits C screening, Tetanus/tdap  . Quality Metric Gaps    Colonoscopy    HPI: Patient is a 74 y.o. male seen in today for an wellness exam at [facility].  Not due for colonoscopy for another 2-3 years per pt.   Diet?none, cut back a little on salt.   Exercise? routinely   Dentition: every 6 months  Ophthalmology appt: planning on going to one, new in town  Routine specialist: pulmonary, cardiologist, plans to see ENT.   Depression screen [facility] 2/9 01/18/2020 01/16/2020 11/21/2019  Decreased Interest 0 0 0  Down, Depressed, Hopeless 0 0 0  PHQ - 2 Score 0 0 0    Fall Risk  01/18/2020 01/16/2020 11/21/2019 08/10/2019  Falls in the past year? 0 0 0 0  Number falls in past yr: 0 0 0 0  Injury with Fall? 0 0 0 0   No flowsheet data found.   Health Maintenance  Topic Date Due  . Hepatitis C Screening  Never done  . COLONOSCOPY  Never done  . TETANUS/TDAP   Never done  . INFLUENZA VACCINE  02/11/2020  . COVID-19 Vaccine  Completed  . PNA vac Low Risk Adult  Completed    Past Medical History:  Diagnosis Date  . Arthritis    Knees  . COPD (chronic obstructive pulmonary disease) ([place])   . Does use hearing aid    Bilateral  . Enlarged prostate   . Hypercholesterolemia     Past Surgical History:  Procedure Laterality Date  . COLONOSCOPY  2017   Dr. Madolyn Frieze    Social History   Socioeconomic History  . Marital status: Unknown    Spouse name: Not on file  . Number of children: Not on file  . Years of education: Not on file  . Highest education level: Not on file  Occupational History  . Not on file  Tobacco Use  . Smoking status: Former Smoker    Packs/day: 1.00    Years: 35.00    Pack years: 35.00    Types: Cigarettes    Quit date: 1980    Years since quitting: 41.5  . Smokeless tobacco: Never Used  Substance and Sexual Activity  . Alcohol use: Not Currently  . Drug use: Never  . Sexual activity: Not on file  Other Topics Concern  . Not on file  Social History Narrative   No pets.  Clinical biochemist.  Trade school education.  Wife is a retired  physician.    Social Determinants of Health   Financial Resource Strain:   . Difficulty of Paying Living Expenses:   Food Insecurity:   . Worried About Charity fundraiser in the Last Year:   . Arboriculturist in the Last Year:   Transportation Needs:   . Film/video editor (Medical):   Marland Kitchen Lack of Transportation (Non-Medical):   Physical Activity:   . Days of Exercise per Week:   . Minutes of Exercise per Session:   Stress:   . Feeling of Stress :   Social Connections:   . Frequency of Communication with Friends and Family:   . Frequency of Social Gatherings with Friends and Family:   . Attends Religious Services:   . Active Member of Clubs or Organizations:   . Attends Archivist Meetings:   Marland Kitchen Marital Status:     Family History  Problem Relation Age of  Onset  . Alzheimer's disease Mother   . Diabetes Mother   . Heart failure Father   . Heart attack Father   . Diabetes Father   . Dementia Father     Review of Systems:  Review of Systems  Constitutional: Negative for chills, fever and weight loss.  HENT: Negative for tinnitus.   Respiratory: Negative for cough, sputum production and shortness of breath.   Cardiovascular: Negative for chest pain, palpitations and leg swelling.  Gastrointestinal: Negative for abdominal pain, constipation, diarrhea and heartburn.  Genitourinary: Negative for dysuria, frequency and urgency.  Musculoskeletal: Negative for back pain, falls, joint pain and myalgias.  Skin: Negative.   Neurological: Negative for dizziness and headaches.  Psychiatric/Behavioral: Negative for depression and memory loss. The patient does not have insomnia.     Allergies as of 01/18/2020      Reactions   Bee Venom    Throat/mouth swelling      Medication List       Accurate as of January 18, 2020 10:55 AM. If you have any questions, ask your nurse or doctor.        acetaminophen 500 MG tablet Commonly known as: TYLENOL Take 500 mg by mouth as needed.   albuterol 108 (90 Base) MCG/ACT inhaler Commonly known as: VENTOLIN HFA Inhale 2 puffs into the lungs every 4 (four) hours as needed for wheezing or shortness of breath.   albuterol (2.5 MG/3ML) 0.083% nebulizer solution Commonly known as: PROVENTIL Take 3 mLs (2.5 mg total) by nebulization daily as needed for wheezing or shortness of breath.   aspirin EC 81 MG tablet Take 81 mg by mouth daily.   Biotin 1000 MCG tablet Take 1,000 mcg by mouth daily.   calcium elemental as carbonate 400 MG chewable tablet Commonly known as: BARIATRIC TUMS ULTRA Chew 1,000 mg by mouth daily as needed.   cetaphil cream Apply topically as needed.   Cod Liver Oil/Vitamins A & D Caps Take by mouth.   cyclobenzaprine 10 MG tablet Commonly known as: FLEXERIL Take 1 tablet (10 mg  total) by mouth 2 (two) times daily as needed for muscle spasms.   famotidine 20 MG tablet Commonly known as: PEPCID Take 20 mg by mouth.   finasteride 5 MG tablet Commonly known as: PROSCAR Take 1 tablet (5 mg total) by mouth daily.   fluticasone 50 MCG/ACT nasal spray Commonly known as: FLONASE Place 1 spray into both nostrils daily.   Garlic 3419 MG Caps Take by mouth.   loratadine 10 MG tablet Commonly known as:  CLARITIN Take 1 tablet (10 mg total) by mouth daily.   melatonin 3 MG Tabs tablet Take 1 tablet by mouth at bedtime.   milk thistle 175 MG tablet Take 175 mg by mouth daily.   MSM 1000 MG Caps Take 1 capsule by mouth 2 (two) times daily.   nabumetone 750 MG tablet Commonly known as: RELAFEN Take 2 tablets (1,500 mg total) by mouth daily. 2 tablets to = 1500 mg   Omega-3 1000 MG Caps Take by mouth.   One Daily Mens Health Tabs Take by mouth.   Osteo Bi-Flex Adv Triple St Tabs Take by mouth.   Prevagen 10 MG Caps Generic drug: Apoaequorin Take 1 capsule by mouth daily.   Saw Palmetto 450 MG Caps Take by mouth.   selenium 200 MCG Tabs tablet Take by mouth daily.   simvastatin 40 MG tablet Commonly known as: ZOCOR Take 1 tablet (40 mg total) by mouth daily.   Spiriva Respimat 2.5 MCG/ACT Aers Generic drug: Tiotropium Bromide Monohydrate Inhale 2 puffs into the lungs daily.   SUPER B-COMPLEX + VITAMIN C PO Take by mouth.   tadalafil 20 MG tablet Commonly known as: Cialis Take 1 tablet (20 mg total) by mouth daily as needed for erectile dysfunction.   tamsulosin 0.4 MG Caps capsule Commonly known as: FLOMAX Take 1 capsule (0.4 mg total) by mouth daily.   temazepam 15 MG capsule Commonly known as: RESTORIL Take 1 capsule (15 mg total) by mouth at bedtime as needed for sleep.   vitamin B-12 500 MCG tablet Commonly known as: CYANOCOBALAMIN Take 500 mcg by mouth daily.   vitamin C 1000 MG tablet Take 1,000 mg by mouth daily.     Vitamin E 180 MG Caps Take by mouth.         Physical Exam: Vitals:   01/18/20 1038  BP: 140/90  Pulse: 97  Temp: 97.8 F (36.6 C)  TempSrc: Temporal  SpO2: (!) 56%  Weight: 213 lb (96.6 kg)  Height: 6' (1.829 m)   Body mass index is 28.89 kg/m. Wt Readings from Last 3 Encounters:  01/18/20 213 lb (96.6 kg)  11/21/19 212 lb (96.2 kg)  09/28/19 215 lb (97.5 kg)    Physical Exam Constitutional:      General: He is not in acute distress.    Appearance: He is well-developed. He is not diaphoretic.  HENT:     Head: Normocephalic and atraumatic.     Right Ear: External ear normal.     Left Ear: External ear normal.     Nose: Nose normal.  Eyes:     Conjunctiva/sclera: Conjunctivae normal.     Pupils: Pupils are equal, round, and reactive to light.  Cardiovascular:     Rate and Rhythm: Normal rate and regular rhythm.     Heart sounds: Normal heart sounds.  Pulmonary:     Effort: Pulmonary effort is normal.     Breath sounds: Normal breath sounds.  Abdominal:     General: Bowel sounds are normal.     Palpations: Abdomen is soft.  Musculoskeletal:        General: No tenderness.     Cervical back: Normal range of motion and neck supple.  Skin:    General: Skin is warm and dry.  Neurological:     Mental Status: He is alert and oriented to person, place, and time.     Cranial Nerves: No cranial nerve deficit.     Motor: No weakness.  Gait: Gait normal.  Psychiatric:        Mood and Affect: Mood normal.        Behavior: Behavior normal.    Labs reviewed: Basic Metabolic Panel: Recent Labs    08/21/19 0000  NA 139  K 4.3  CL 106  CO2 25*  BUN 22*  CREATININE 0.8  CALCIUM 9.7   Liver Function Tests: Recent Labs    08/21/19 0000  AST 21  ALT 21  ALKPHOS 63  ALBUMIN 4.3   No results for input(s): LIPASE, AMYLASE in the last 8760 hours. No results for input(s): AMMONIA in the last 8760 hours. CBC: Recent Labs    08/21/19 0000  WBC 5.3   NEUTROABS 2,337  HGB 16.1  HCT 45  PLT 122*   Lipid Panel: Recent Labs    08/21/19 0000  CHOL 162  HDL 38  LDLCALC 103  TRIG 118   No results found for: HGBA1C  Procedures: No results found.  Assessment/Plan 1. Preventative health care -completed today, Awv completed on 01/16/20 -PREVENTIVE COUNSELING:  The patient was counseled regarding the appropriate use of alcohol, regular self-examination of the breasts on a monthly basis, prevention of dental and periodontal disease, diet, regular sustained exercise for at least 30 minutes 5 times per week,  tobacco use,  and recommended schedule for GI hemoccult testing, colonoscopy, cholesterol, thyroid and diabetes screening.   2. Chronic obstructive pulmonary disease, unspecified COPD type ([place]) Stable, plans to follow up with pulmonary to make sure he is optimizing his lung function. Continues on spiriva and albuterol PRN  3. Mixed hyperlipidemia Continues on simvastatin, discussed dietary modifications. Continues to exercise.   4. Essential hypertension Slightly elevated today, encouraged dietary modifications (has not been strict on this) which will improve bp. Not currently on medications.   5. Benign prostatic hyperplasia with urinary frequency Stable on proscar and flomax  6. Family hx of prostate cancer PSA was done with labs in february and stable. Will continue with yearly monitoring.   Next appt: 6 months.  Joshua Mercado. [place], [facility] [phone]

## 2020-01-19 DIAGNOSIS — E782 Mixed hyperlipidemia: Secondary | ICD-10-CM | POA: Insufficient documentation

## 2020-01-19 DIAGNOSIS — Z8042 Family history of malignant neoplasm of prostate: Secondary | ICD-10-CM | POA: Insufficient documentation

## 2020-01-19 DIAGNOSIS — J449 Chronic obstructive pulmonary disease, unspecified: Secondary | ICD-10-CM | POA: Insufficient documentation

## 2020-01-19 DIAGNOSIS — N401 Enlarged prostate with lower urinary tract symptoms: Secondary | ICD-10-CM | POA: Insufficient documentation

## 2020-01-19 DIAGNOSIS — I10 Essential (primary) hypertension: Secondary | ICD-10-CM | POA: Insufficient documentation

## 2020-01-19 NOTE — Patient Instructions (Signed)

## 2020-01-23 ENCOUNTER — Ambulatory Visit: Payer: Medicare PPO | Admitting: Pulmonary Disease

## 2020-01-23 ENCOUNTER — Other Ambulatory Visit: Payer: Self-pay

## 2020-01-23 ENCOUNTER — Encounter: Payer: Self-pay | Admitting: Pulmonary Disease

## 2020-01-23 VITALS — BP 110/80 | HR 74 | Temp 97.8°F | Ht 72.0 in | Wt 214.0 lb

## 2020-01-23 DIAGNOSIS — J449 Chronic obstructive pulmonary disease, unspecified: Secondary | ICD-10-CM

## 2020-01-23 DIAGNOSIS — Z9989 Dependence on other enabling machines and devices: Secondary | ICD-10-CM

## 2020-01-23 DIAGNOSIS — G4733 Obstructive sleep apnea (adult) (pediatric): Secondary | ICD-10-CM

## 2020-01-25 ENCOUNTER — Other Ambulatory Visit: Payer: Self-pay

## 2020-01-25 DIAGNOSIS — R252 Cramp and spasm: Secondary | ICD-10-CM

## 2020-01-25 MED ORDER — CYCLOBENZAPRINE HCL 10 MG PO TABS: 10.0000 mg | ORAL_TABLET | Freq: Two times a day (BID) | ORAL | 1 refills | Status: DC | PRN

## 2020-01-25 NOTE — Telephone Encounter (Signed)
Refill request received from [facility] for Cyclobenzaprine 10 mg tablet. Warning came up when trying to fill medication. Routed to [facility] for approval.

## 2020-02-15 ENCOUNTER — Telehealth: Payer: Self-pay

## 2020-02-15 ENCOUNTER — Other Ambulatory Visit: Payer: Self-pay

## 2020-02-15 DIAGNOSIS — N401 Enlarged prostate with lower urinary tract symptoms: Secondary | ICD-10-CM

## 2020-02-15 DIAGNOSIS — R350 Frequency of micturition: Secondary | ICD-10-CM

## 2020-02-15 MED ORDER — ALBUTEROL SULFATE (2.5 MG/3ML) 0.083% IN NEBU: 2.5000 mg | INHALATION_SOLUTION | Freq: Every day | RESPIRATORY_TRACT | 3 refills | Status: DC | PRN

## 2020-02-15 NOTE — Telephone Encounter (Signed)
Spoke with patient regarding refill request received via manual fax from [facility] order.  Rx last filled 11/22/2019  Per Mr.Hawn rx was sent in for 75 mL which is roughly a 30 day supply.   I asked how often patient is using nebulizer and her did not give a straight forward answer. Patient states I use after working out.   RX sent for 90 day supply as requested

## 2020-02-15 NOTE — Telephone Encounter (Signed)
Incoming fax received from [facility] requesting a refill on Albuterol nebulizer solution.  RX was last filled on 11/22/2019, 75 mL with 3 refills.   I need to confirm patient requested refill and confirm frequency of use of nebulizer   Left message on voicemail for patient to return call when available. Awaiting return call.

## 2020-02-20 NOTE — Telephone Encounter (Signed)
Late Entry  Patient returned call on 02/15/2020 and stated rx was filled for a 30 day supply verus 90 and he uses after working out   RX was sent on 02/15/2020

## 2020-02-29 ENCOUNTER — Other Ambulatory Visit: Payer: Self-pay | Admitting: Otolaryngology

## 2020-02-29 DIAGNOSIS — H903 Sensorineural hearing loss, bilateral: Secondary | ICD-10-CM | POA: Diagnosis not present

## 2020-02-29 DIAGNOSIS — R05 Cough: Secondary | ICD-10-CM | POA: Diagnosis not present

## 2020-02-29 DIAGNOSIS — J383 Other diseases of vocal cords: Secondary | ICD-10-CM | POA: Diagnosis not present

## 2020-02-29 DIAGNOSIS — J3800 Paralysis of vocal cords and larynx, unspecified: Secondary | ICD-10-CM

## 2020-02-29 DIAGNOSIS — J342 Deviated nasal septum: Secondary | ICD-10-CM | POA: Diagnosis not present

## 2020-03-13 ENCOUNTER — Other Ambulatory Visit: Payer: Medicare PPO

## 2020-03-19 ENCOUNTER — Telehealth: Payer: Self-pay | Admitting: Pulmonary Disease

## 2020-03-19 ENCOUNTER — Other Ambulatory Visit: Payer: Self-pay | Admitting: *Deleted

## 2020-03-19 DIAGNOSIS — J302 Other seasonal allergic rhinitis: Secondary | ICD-10-CM

## 2020-03-19 MED ORDER — LORATADINE 10 MG PO TABS: 10.0000 mg | ORAL_TABLET | Freq: Every day | ORAL | 1 refills | Status: DC

## 2020-03-19 NOTE — Telephone Encounter (Signed)
Patient requested refill

## 2020-03-19 NOTE — Telephone Encounter (Signed)
Attempted to call pt but unable to reach. Left message for him to return call. °

## 2020-03-19 NOTE — Telephone Encounter (Signed)
Spoke with patient he states that he was having some symptoms of a cold but is feeing much better now. Asked patient if he was having any symptoms at all went down list and he answered no to all of them. Informed patient that if anything changes for him to call and let us know so that arrangements can be made. Patient expressed understanding. Nothing further needed at this time.

## 2020-03-21 ENCOUNTER — Encounter: Payer: Self-pay | Admitting: Pulmonary Disease

## 2020-03-21 ENCOUNTER — Ambulatory Visit: Payer: Medicare PPO | Admitting: Pulmonary Disease

## 2020-03-21 ENCOUNTER — Ambulatory Visit
Admission: RE | Admit: 2020-03-21 | Discharge: 2020-03-21 | Disposition: A | Discharge status: Home / Self Care | Payer: Medicare PPO | Source: Ambulatory Visit | Attending: Otolaryngology | Admitting: Otolaryngology

## 2020-03-21 ENCOUNTER — Other Ambulatory Visit: Payer: Self-pay

## 2020-03-21 VITALS — BP 150/90 | HR 64 | Temp 97.1°F | Ht 72.0 in | Wt 206.6 lb

## 2020-03-21 DIAGNOSIS — Z9989 Dependence on other enabling machines and devices: Secondary | ICD-10-CM | POA: Diagnosis not present

## 2020-03-21 DIAGNOSIS — J449 Chronic obstructive pulmonary disease, unspecified: Secondary | ICD-10-CM | POA: Diagnosis not present

## 2020-03-21 DIAGNOSIS — G4733 Obstructive sleep apnea (adult) (pediatric): Secondary | ICD-10-CM

## 2020-03-21 DIAGNOSIS — I6523 Occlusion and stenosis of bilateral carotid arteries: Secondary | ICD-10-CM | POA: Diagnosis not present

## 2020-03-21 DIAGNOSIS — I708 Atherosclerosis of other arteries: Secondary | ICD-10-CM | POA: Diagnosis not present

## 2020-03-21 DIAGNOSIS — J3800 Paralysis of vocal cords and larynx, unspecified: Secondary | ICD-10-CM | POA: Diagnosis not present

## 2020-03-21 DIAGNOSIS — J387 Other diseases of larynx: Secondary | ICD-10-CM | POA: Diagnosis not present

## 2020-03-21 LAB — POCT I-STAT CREATININE: Creatinine, Ser: 0.9 mg/dL (ref 0.61–1.24)

## 2020-03-21 IMAGING — CT CT NECK W/ CM
3 of 5 series · 12 of 33 positions shown, 14 images · IV contrast (omnipaque)
Comparison: No pertinent prior exams are available for comparison.

CLINICAL DATA: Vocal cord paralysis. Additional history provided:
Patient reports follow-up to endoscopy seeing "lump" in throat,
persistent cough, patient reports food becoming stuck in throat.

EXAM:
CT NECK WITH CONTRAST
TECHNIQUE: Multidetector CT imaging of the neck was performed using the
standard protocol following the bolus administration of intravenous
contrast.
CONTRAST:  75mL OMNIPAQUE IOHEXOL 300 MG/ML  SOLN

[Series 5: sag neck · sagittal · 0.44mm/px · 5 of 129 slices shown, 6 images]
[im 43/129  bone]
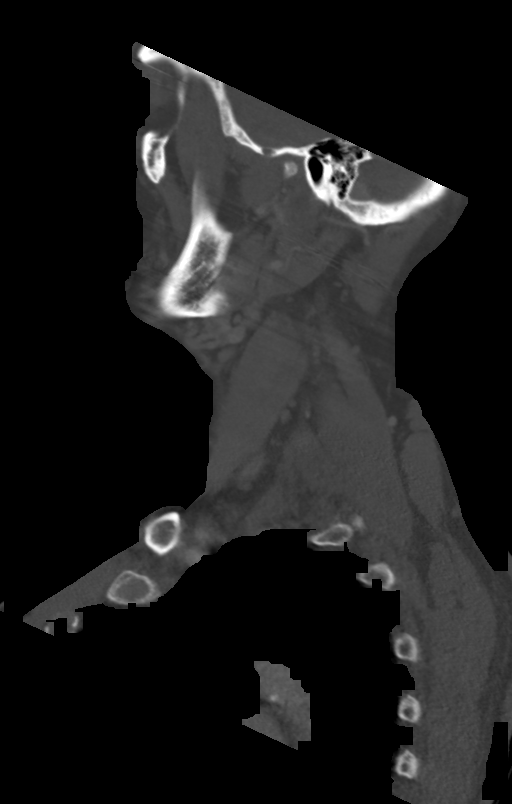
[im 54/129  bone]
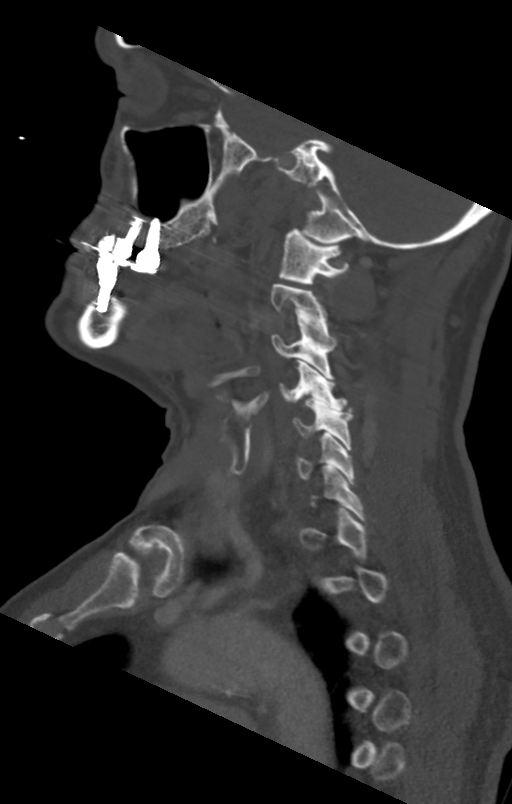
[im 65/129  soft-tissue]
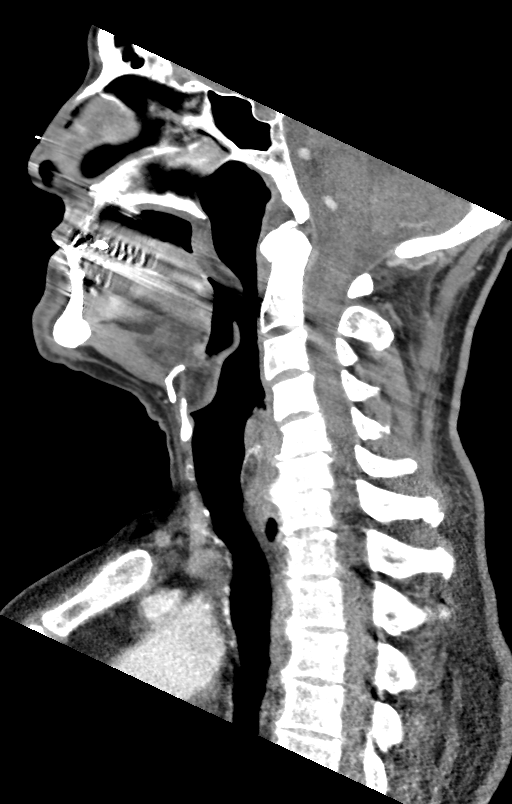
[im 65/129  bone]
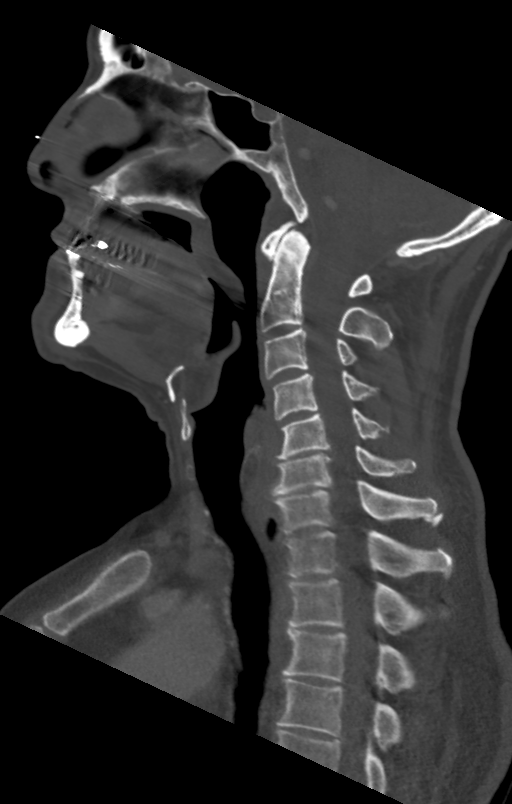
[im 75/129  bone]
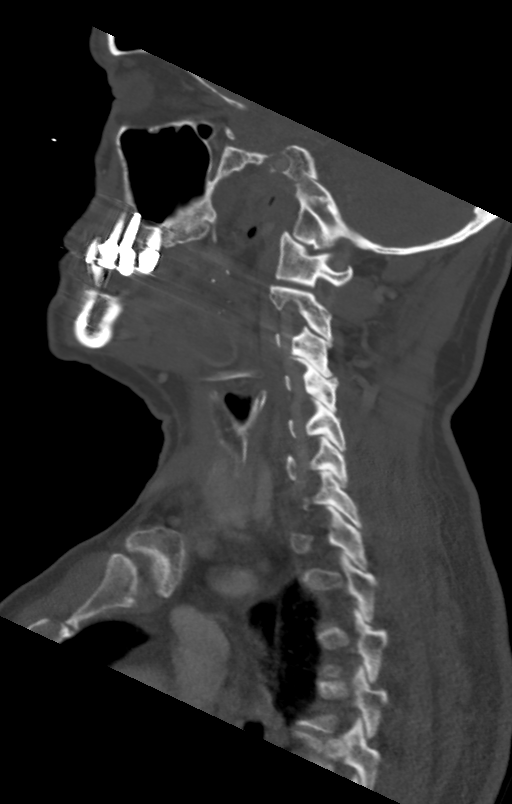
[im 86/129  bone]
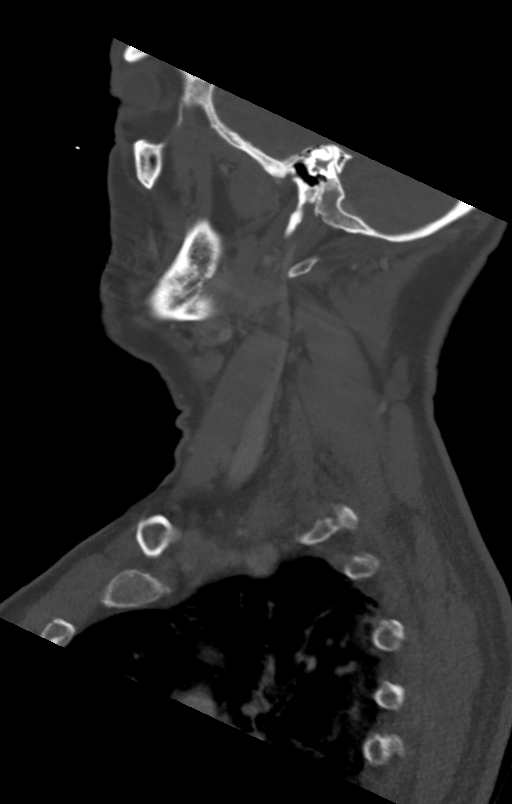

[Series 6: cor neck · coronal · 0.50mm/px · 3 of 112 slices shown]
[im 39/112  bone]
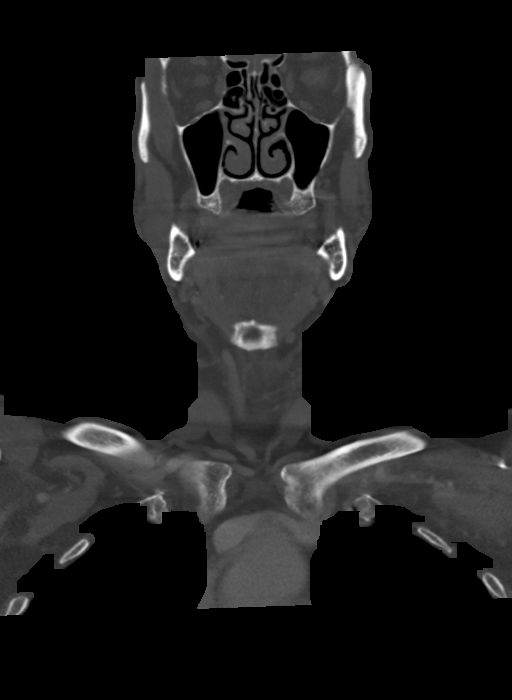
[im 50/112  bone]
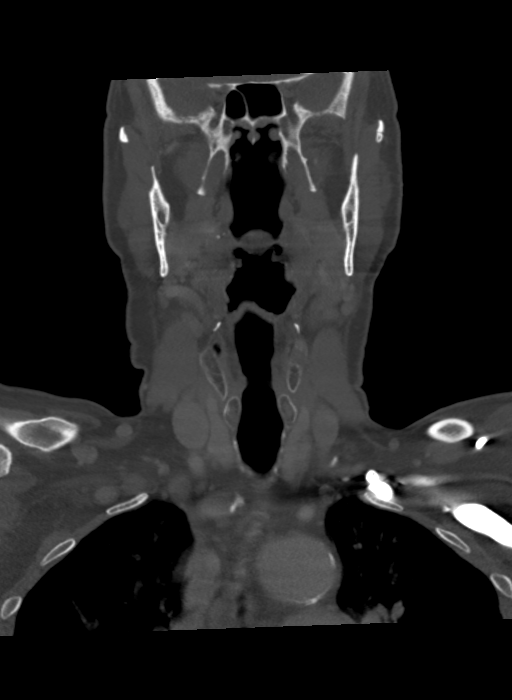
[im 62/112  bone]
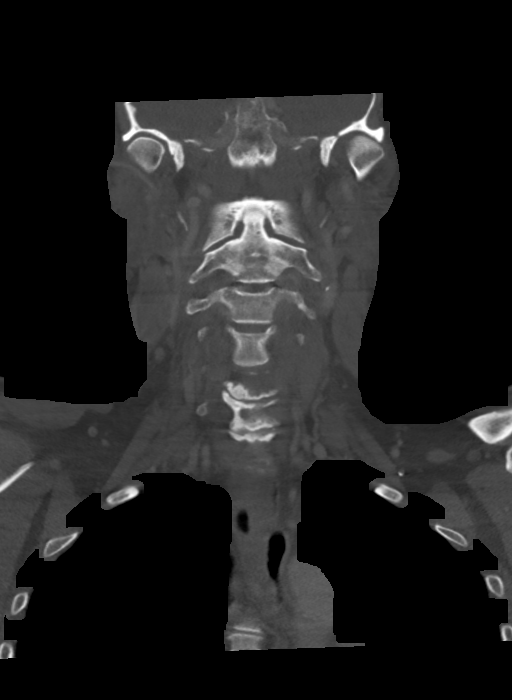

[Series 7: orthogonal ax · axial · 0.44mm/px · z∈[-318,-134]mm · 4 of 172 slices shown, 5 images]
[im 35/172  soft-tissue]
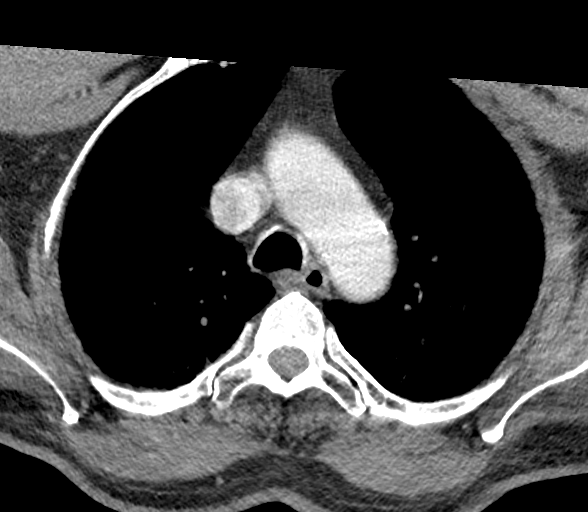
[im 35/172  bone]
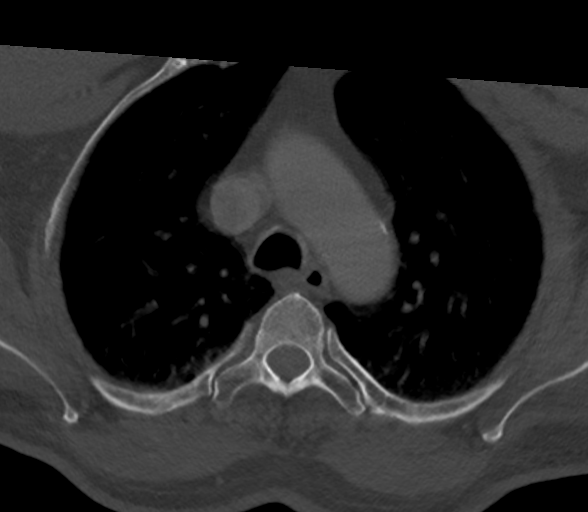
[im 69/172  bone]
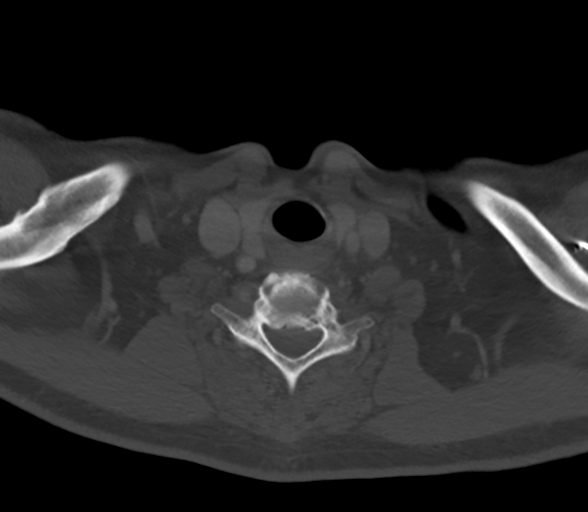
[im 103/172  bone]
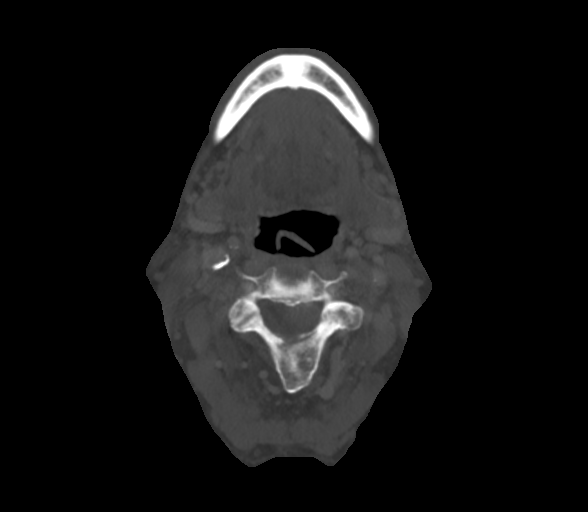
[im 137/172  bone]
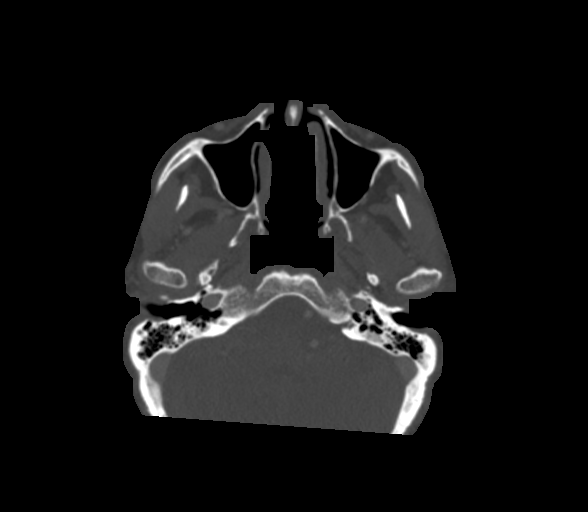

[12 of 33 positions shown; findings below may reference images not displayed]

FINDINGS: Pharynx and larynx: Streak artifact from dental restoration
partially obscures the oral cavity. There is subtle nonspecific
asymmetric soft tissue fullness along the right palatine
tonsil/lateral right oropharyngeal wall (series 7, image 71).
Postinflammatory calcifications within the palatine tonsils
bilaterally. No appreciable swelling or discrete mass within the
oral cavity, nasopharynx, hypopharynx or larynx. There are no
specific imaging findings to suggest vocal cord paralysis.

Salivary glands: No inflammation, mass, or stone.

Thyroid: Unremarkable

Lymph nodes: No pathologically enlarged cervical chain lymph nodes
are identified.

Vascular: The major vascular structures of the neck are patent.
Atherosclerotic calcification within the visualized aortic arch,
proximal major branch vessels of the neck and carotid arteries.

Limited intracranial: No acute intracranial abnormality is
identified atherosclerotic calcifications of the carotid artery
siphons and intracranial vertebral arteries.

Visualized orbits: Chronic deformity of the left lamina papyracea.
The visualized orbits are otherwise unremarkable.

Mastoids and visualized paranasal sinuses: No significant paranasal
sinus disease or mastoid effusion at the imaged levels.

Skeleton: No acute bony abnormality or aggressive osseous lesion.
Cervical spondylosis.

Upper chest: Ill-defined opacity within the dependent imaged lung
apices, likely reflecting atelectasis.
IMPRESSION: Subtle nonspecific asymmetric soft tissue fullness in the region of
the right palatine tonsil/lateral right oropharyngeal wall.
Correlate with endoscopy findings to exclude a small lesion at this
site.

No specific imaging findings to suggest vocal cord paralysis

No cervical lymphadenopathy.

Calcified arterial atherosclerotic plaque as described.

## 2020-03-21 MED ORDER — IOHEXOL 300 MG/ML  SOLN
75.0000 mL | Freq: Once | INTRAMUSCULAR | Status: AC | PRN
  Administered 2020-03-21: 75 mL via INTRAVENOUS

## 2020-03-21 NOTE — Patient Instructions (Signed)
Follow up in 6 months 

## 2020-03-21 NOTE — Progress Notes (Signed)
[facility]  Chief Complaint  Patient presents with  . Consult    Patient needs to be established with local DME. Patient also has Phillips CPAP machine does not know if his has been recalled. Wakes up not able to breath almost gasping, wakes up about twice a night. Snores, sleeps on side    Constitutional:  BP (!) 150/90 (BP Location: Right Arm, Patient Position: Sitting, Cuff Size: Normal)   Pulse 64   Temp (!) 97.1 F (36.2 C) (Temporal)   Ht 6' (1.829 m)   Wt 206 lb 9.6 oz (93.7 kg)   SpO2 98%   BMI 28.02 kg/m   Past Medical History:  BPH, HLD  Past Surgical History:  His  has a past surgical history that includes Colonoscopy (2017).  Brief Summary:  Joshua Mercado is a 74 y.o. male with former smoker with COPD and obstructive sleep apnea.      Subjective:   He had sleep study in 2017 and 2019.  He was tried on CPAP, but apparently wasn't adequately controlled.  He was then tried on Bipap, but wasn't able to tolerate this.  He switched back to CPAP and has Respironics device.  He continues to struggle with CPAP.  He really only uses when he has sinus congestion and then will wear the mask to open his sinuses up.  He is sleeping on his side more and his wife says his snoring is better.  He is not having cough, wheeze, sputum, or dyspnea with routine activity.  He was seen by ENT for deviated nasal septum and might have surgery for this.  Physical Exam:   Appearance - well kempt   ENMT - no sinus tenderness, no oral exudate, no LAN, Mallampati 3 airway, no stridor, deviated septum, low laying soft palate, elongated uvula  Respiratory - equal breath sounds bilaterally, no wheezing or rales  CV - s1s2 regular rate and rhythm, no murmurs  Ext - no clubbing, no edema  Skin - no rashes  Psych - normal mood and affect   Pulmonary testing:   PFT 10/31/19 >> FEV1 2.84 (85%), FEV1% 69, TLC 7.45 (97%), DLCO 74%, +BD  Chest  Imaging:   CT chest 01/10/14 >> apical scarring, old granulomas, trace BTX in Rt base  Sleep Tests:   HST 02/06/16 >> AHI 16.6, SpO2 low 84%  PSG 11/18/17 >> AHI 11.1, SpO2 low 87%, Bipap 17/13 cm H2O  Social History:  He  reports that he quit smoking about 41 years ago. His smoking use included cigarettes. He has a 35.00 pack-year smoking history. He has never used smokeless tobacco. He reports previous alcohol use. He reports that he does not use drugs.  Family History:  His family history includes Alzheimer's disease in his mother; Dementia in his father; Diabetes in his father and mother; Heart attack in his father; Heart failure in his father.     Assessment/Plan:   COPD. - improved since he started spiriva, and will continue this - prn albuterol  Obstructive sleep apnea. - he has trouble tolerating CPAP and Bipap - discussed alternative therapies for obstructive sleep apnea: oral appliance, surgical intervention, inspire device - he will f/u with Dr. Carloyn Manner with ENT to determine if he has surgical intervention for deviated septum - if he has surgery, then will need to reassess status of sleep apnea after he fully recovers from surgery - if he is not a surgical candidate, then he will call  to arrange for sleep apnea therapy sooner   Time Spent Involved in Patient Care on Day of Examination:  32 minutes  Follow up:  Patient Instructions  Follow up in 6 months   Medication List:   Allergies as of 03/21/2020      Reactions   Bee Venom    Throat/mouth swelling      Medication List       Accurate as of March 21, 2020 12:23 PM. If you have any questions, ask your nurse or doctor.        acetaminophen 500 MG tablet Commonly known as: TYLENOL Take 500 mg by mouth as needed.   albuterol 108 (90 Base) MCG/ACT inhaler Commonly known as: VENTOLIN HFA Inhale 2 puffs into the lungs every 4 (four) hours as needed for wheezing or shortness of breath.     albuterol (2.5 MG/3ML) 0.083% nebulizer solution Commonly known as: PROVENTIL Take 3 mLs (2.5 mg total) by nebulization daily as needed for wheezing or shortness of breath.   aspirin EC 81 MG tablet Take 81 mg by mouth daily.   Biotin 1000 MCG tablet Take 1,000 mcg by mouth daily.   calcium elemental as carbonate 400 MG chewable tablet Commonly known as: BARIATRIC TUMS ULTRA Chew 1,000 mg by mouth daily as needed.   cetaphil cream Apply topically as needed.   Cod Liver Oil/Vitamins A & D Caps Take by mouth.   cyclobenzaprine 10 MG tablet Commonly known as: FLEXERIL Take 1 tablet (10 mg total) by mouth 2 (two) times daily as needed for muscle spasms.   famotidine 20 MG tablet Commonly known as: PEPCID Take 20 mg by mouth.   finasteride 5 MG tablet Commonly known as: PROSCAR Take 1 tablet (5 mg total) by mouth daily.   fluticasone 50 MCG/ACT nasal spray Commonly known as: FLONASE Place 1 spray into both nostrils daily.   Garlic 3536 MG Caps Take by mouth.   loratadine 10 MG tablet Commonly known as: CLARITIN Take 1 tablet (10 mg total) by mouth daily.   melatonin 3 MG Tabs tablet Take 1 tablet by mouth at bedtime.   milk thistle 175 MG tablet Take 175 mg by mouth daily.   MSM 1000 MG Caps Take 1 capsule by mouth 2 (two) times daily.   nabumetone 750 MG tablet Commonly known as: RELAFEN Take 2 tablets (1,500 mg total) by mouth daily. 2 tablets to = 1500 mg   Omega-3 1000 MG Caps Take by mouth.   One Daily Mens Health Tabs Take by mouth.   Osteo Bi-Flex Adv Triple St Tabs Take by mouth.   Prevagen 10 MG Caps Generic drug: Apoaequorin Take 1 capsule by mouth daily.   Saw Palmetto 450 MG Caps Take by mouth.   selenium 200 MCG Tabs tablet Take by mouth daily.   simvastatin 40 MG tablet Commonly known as: ZOCOR Take 1 tablet (40 mg total) by mouth daily.   Spiriva Respimat 2.5 MCG/ACT Aers Generic drug: Tiotropium Bromide Monohydrate Inhale 2  puffs into the lungs daily.   SUPER B-COMPLEX + VITAMIN C PO Take by mouth.   tadalafil 20 MG tablet Commonly known as: Cialis Take 1 tablet (20 mg total) by mouth daily as needed for erectile dysfunction.   tamsulosin 0.4 MG Caps capsule Commonly known as: FLOMAX Take 1 capsule (0.4 mg total) by mouth daily.   temazepam 15 MG capsule Commonly known as: RESTORIL Take 1 capsule (15 mg total) by mouth at bedtime as needed for  sleep.   vitamin B-12 500 MCG tablet Commonly known as: CYANOCOBALAMIN Take 500 mcg by mouth daily.   vitamin C 1000 MG tablet Take 1,000 mg by mouth daily.   Vitamin E 180 MG Caps Take by mouth.       Signature:  Chesley Mires, MD [place] Pager - [phone] 03/21/2020, 12:23 PM

## 2020-03-25 ENCOUNTER — Telehealth: Payer: Self-pay | Admitting: *Deleted

## 2020-03-25 NOTE — Telephone Encounter (Signed)
Patient called and stated that he wants a refill on his Loratadine D. Stated that it has to be the one with the D in it and stated that it needs to go to the [facility].   The Regular is the one in his medication list. Is it ok to add the Loratadine D and refill.   Please Advise.

## 2020-03-25 NOTE — Telephone Encounter (Signed)
Patient wanted to schedule an appointment to discuss with Janett Billow. Scheduled one for tomorrow 03/26/2020 at [facility].

## 2020-03-25 NOTE — Telephone Encounter (Signed)
No he should not be taking the "d"  Okay to refill loratadine 10 mg daily

## 2020-03-26 ENCOUNTER — Encounter: Payer: Self-pay | Admitting: Nurse Practitioner

## 2020-03-26 ENCOUNTER — Other Ambulatory Visit: Payer: Self-pay

## 2020-03-26 ENCOUNTER — Ambulatory Visit: Payer: Medicare PPO | Admitting: Nurse Practitioner

## 2020-03-26 VITALS — BP 110/80 | HR 52 | Temp 97.2°F | Ht 72.0 in

## 2020-03-26 DIAGNOSIS — J342 Deviated nasal septum: Secondary | ICD-10-CM

## 2020-03-26 DIAGNOSIS — I10 Essential (primary) hypertension: Secondary | ICD-10-CM | POA: Diagnosis not present

## 2020-03-26 DIAGNOSIS — Z9989 Dependence on other enabling machines and devices: Secondary | ICD-10-CM

## 2020-03-26 DIAGNOSIS — G4733 Obstructive sleep apnea (adult) (pediatric): Secondary | ICD-10-CM

## 2020-03-26 DIAGNOSIS — R0981 Nasal congestion: Secondary | ICD-10-CM

## 2020-03-26 DIAGNOSIS — J449 Chronic obstructive pulmonary disease, unspecified: Secondary | ICD-10-CM

## 2020-03-26 NOTE — Progress Notes (Signed)
Careteam: Patient Care Team: Lauree Chandler, NP as PCP - General (Geriatric Medicine) Kate Sable, MD as PCP - Cardiology (Cardiology)  Advanced Directive information    Allergies  Allergen Reactions  . Bee Venom     Throat/mouth swelling    Chief Complaint  Patient presents with  . Acute Visit    Patient would like to discuss medications. Patient needs to also dicuss breathing issues.      HPI: Patient is a 74 y.o. male seen in today at the [facility] for follow up Pt went to see ENT and pulmonary.  He was evaluated for sleep apnea.   ENT saw a cyst on his throat; soft tissue and CT was scheduled.  Has a deviated septum. Wants to use Claritin D. Flonase does not do the job. Seeing ENT tomorrow to review CT and evaluate cyst.  Has had friends to tell him not to do the nasal correction but it is an issue with breathing.  Using the Claritin D helps open him up so he does not have to use the CPAP.   Review of Systems:  Review of Systems  Constitutional: Negative for chills, fever and weight loss.  HENT: Positive for congestion. Negative for sinus pain and tinnitus.   Respiratory: Positive for cough. Negative for sputum production and shortness of breath.   Cardiovascular: Negative for chest pain, palpitations and leg swelling.  Gastrointestinal: Negative for heartburn.  Genitourinary: Negative for dysuria, frequency and urgency.  Musculoskeletal: Negative for back pain, falls, joint pain and myalgias.  Skin: Negative.   Neurological: Negative for headaches.    Past Medical History:  Diagnosis Date  . Arthritis    Knees  . COPD (chronic obstructive pulmonary disease) ([place])   . Does use hearing aid    Bilateral  . Enlarged prostate   . Hypercholesterolemia    Past Surgical History:  Procedure Laterality Date  . COLONOSCOPY  2017   Dr. Madolyn Frieze   Social History:   reports that he quit smoking about 41 years ago. His smoking use included  cigarettes. He has a 35.00 pack-year smoking history. He has never used smokeless tobacco. He reports previous alcohol use. He reports that he does not use drugs.  Family History  Problem Relation Age of Onset  . Alzheimer's disease Mother   . Diabetes Mother   . Heart failure Father   . Heart attack Father   . Diabetes Father   . Dementia Father     Medications: Patient's Medications  New Prescriptions   No medications on file  Previous Medications   ACETAMINOPHEN (TYLENOL) 500 MG TABLET    Take 500 mg by mouth as needed.   ALBUTEROL (PROVENTIL) (2.5 MG/3ML) 0.083% NEBULIZER SOLUTION    Take 3 mLs (2.5 mg total) by nebulization daily as needed for wheezing or shortness of breath.   ALBUTEROL (VENTOLIN HFA) 108 (90 BASE) MCG/ACT INHALER    Inhale 2 puffs into the lungs every 4 (four) hours as needed for wheezing or shortness of breath.   APOAEQUORIN (PREVAGEN) 10 MG CAPS    Take 1 capsule by mouth daily.    ASCORBIC ACID (VITAMIN C) 1000 MG TABLET    Take 1,000 mg by mouth daily.   ASPIRIN EC 81 MG TABLET    Take 81 mg by mouth daily.   B COMPLEX-C (SUPER B-COMPLEX + VITAMIN C PO)    Take by mouth.   BIOTIN 1000 MCG TABLET    Take 1,000  mcg by mouth daily.    CALCIUM ELEMENTAL AS CARBONATE (BARIATRIC TUMS ULTRA) 400 MG CHEWABLE TABLET    Chew 1,000 mg by mouth daily as needed.    CETAPHIL (CETAPHIL) CREAM    Apply topically as needed.   COD LIVER OIL/VITAMINS A & D CAPS    Take by mouth.   CYCLOBENZAPRINE (FLEXERIL) 10 MG TABLET    Take 1 tablet (10 mg total) by mouth 2 (two) times daily as needed for muscle spasms.   FAMOTIDINE (PEPCID) 20 MG TABLET    Take 20 mg by mouth.   FINASTERIDE (PROSCAR) 5 MG TABLET    Take 1 tablet (5 mg total) by mouth daily.   FLUTICASONE (FLONASE) 50 MCG/ACT NASAL SPRAY    Place 1 spray into both nostrils daily.   GARLIC 6295 MG CAPS    Take by mouth.   LORATADINE (CLARITIN) 10 MG TABLET    Take 1 tablet (10 mg total) by mouth daily.   MELATONIN 3 MG  TABS    Take 5 mg by mouth at bedtime.    METHYLSULFONYLMETHANE (MSM) 1000 MG CAPS    Take 1 capsule by mouth 2 (two) times daily.   MILK THISTLE 175 MG TABLET    Take 175 mg by mouth daily.   MISC NATURAL PRODUCTS (OSTEO BI-FLEX ADV TRIPLE ST) TABS    Take by mouth.   MULTIPLE VITAMINS-MINERALS (ONE DAILY MENS HEALTH) TABS    Take by mouth.   NABUMETONE (RELAFEN) 750 MG TABLET    Take 2 tablets (1,500 mg total) by mouth daily. 2 tablets to = 1500 mg   OMEGA-3 1000 MG CAPS    Take by mouth.   SAW PALMETTO 450 MG CAPS    Take by mouth.   SELENIUM 200 MCG TABS TABLET    Take by mouth daily.   SIMVASTATIN (ZOCOR) 40 MG TABLET    Take 1 tablet (40 mg total) by mouth daily.   TADALAFIL (CIALIS) 20 MG TABLET    Take 1 tablet (20 mg total) by mouth daily as needed for erectile dysfunction.   TAMSULOSIN (FLOMAX) 0.4 MG CAPS CAPSULE    Take 1 capsule (0.4 mg total) by mouth daily.   TEMAZEPAM (RESTORIL) 15 MG CAPSULE    Take 1 capsule (15 mg total) by mouth at bedtime as needed for sleep.   TIOTROPIUM BROMIDE MONOHYDRATE (SPIRIVA RESPIMAT) 2.5 MCG/ACT AERS    Inhale 2 puffs into the lungs daily.   VITAMIN B-12 (CYANOCOBALAMIN) 500 MCG TABLET    Take 500 mcg by mouth daily.   VITAMIN E 180 MG CAPS    Take by mouth.  Modified Medications   No medications on file  Discontinued Medications   No medications on file    Physical Exam:  Vitals:   03/26/20 0905  BP: 110/80  Pulse: (!) 52  Temp: (!) 97.2 F (36.2 C)  TempSrc: Temporal  SpO2: 97%  Height: 6' (1.829 m)   Body mass index is 28.02 kg/m. Wt Readings from Last 3 Encounters:  03/21/20 206 lb 9.6 oz (93.7 kg)  01/23/20 214 lb (97.1 kg)  01/18/20 213 lb (96.6 kg)    Physical Exam Constitutional:      General: He is not in acute distress.    Appearance: He is well-developed. He is not diaphoretic.  HENT:     Head: Normocephalic and atraumatic.     Nose: Nose normal. No congestion.     Mouth/Throat:     Mouth: Mucous  membranes  are moist.     Pharynx: Oropharynx is clear. No oropharyngeal exudate or posterior oropharyngeal erythema.  Eyes:     Conjunctiva/sclera: Conjunctivae normal.     Pupils: Pupils are equal, round, and reactive to light.  Cardiovascular:     Rate and Rhythm: Normal rate and regular rhythm.     Heart sounds: Normal heart sounds.  Pulmonary:     Effort: Pulmonary effort is normal.     Breath sounds: Normal breath sounds.  Abdominal:     General: Bowel sounds are normal.     Palpations: Abdomen is soft.  Musculoskeletal:     Cervical back: Normal range of motion and neck supple.  Skin:    General: Skin is warm and dry.  Neurological:     Mental Status: He is alert and oriented to person, place, and time.     Labs reviewed: Basic Metabolic Panel: Recent Labs    08/21/19 0000 03/21/20 1005  NA 139  --   K 4.3  --   CL 106  --   CO2 25*  --   BUN 22*  --   CREATININE 0.8 0.90  CALCIUM 9.7  --    Liver Function Tests: Recent Labs    08/21/19 0000  AST 21  ALT 21  ALKPHOS 63  ALBUMIN 4.3   No results for input(s): LIPASE, AMYLASE in the last 8760 hours. No results for input(s): AMMONIA in the last 8760 hours. CBC: Recent Labs    08/21/19 0000  WBC 5.3  NEUTROABS 2,337  HGB 16.1  HCT 45  PLT 122*   Lipid Panel: Recent Labs    08/21/19 0000  CHOL 162  HDL 38  LDLCALC 103  TRIG 118   TSH: No results for input(s): TSH in the last 8760 hours. A1C: No results found for: HGBA1C   Assessment/Plan 1. OSA on CPAP Does not prefer to wear CPAP, followed by pulmonary.  2. Essential hypertension Stable at this time, with hx of htn recommended against use of decongestants.  3. Chronic obstructive pulmonary disease, unspecified COPD type ([place]) Stable on current regimen, continues to follow up with pulmonary.   4. Deviated nasal septum Reports this is causing him more difficulty with breathing and congestion. Following with ENT, feels like he should be taking  Claritin D routinely however recommend against taking a decongestant due to adverse effects/side effects and explained this to patient in detail  5. Nasal congestion To follow up with ENT for further recommendations, recommended against using decongestant such as pseudoephedrine and Phenylephrine  Next appt: 07/23/2020 [facility]. [place], [facility] [phone]

## 2020-03-27 DIAGNOSIS — R1314 Dysphagia, pharyngoesophageal phase: Secondary | ICD-10-CM | POA: Diagnosis not present

## 2020-03-27 DIAGNOSIS — D104 Benign neoplasm of tonsil: Secondary | ICD-10-CM | POA: Diagnosis not present

## 2020-03-27 DIAGNOSIS — J342 Deviated nasal septum: Secondary | ICD-10-CM | POA: Diagnosis not present

## 2020-03-27 DIAGNOSIS — R05 Cough: Secondary | ICD-10-CM | POA: Diagnosis not present

## 2020-04-24 DIAGNOSIS — M170 Bilateral primary osteoarthritis of knee: Secondary | ICD-10-CM | POA: Diagnosis not present

## 2020-05-02 DIAGNOSIS — M25562 Pain in left knee: Secondary | ICD-10-CM | POA: Diagnosis not present

## 2020-05-02 DIAGNOSIS — M25561 Pain in right knee: Secondary | ICD-10-CM | POA: Diagnosis not present

## 2020-05-02 DIAGNOSIS — M170 Bilateral primary osteoarthritis of knee: Secondary | ICD-10-CM | POA: Diagnosis not present

## 2020-05-08 DIAGNOSIS — M170 Bilateral primary osteoarthritis of knee: Secondary | ICD-10-CM | POA: Diagnosis not present

## 2020-05-08 DIAGNOSIS — M25562 Pain in left knee: Secondary | ICD-10-CM | POA: Diagnosis not present

## 2020-05-08 DIAGNOSIS — M25561 Pain in right knee: Secondary | ICD-10-CM | POA: Diagnosis not present

## 2020-05-13 DIAGNOSIS — N528 Other male erectile dysfunction: Secondary | ICD-10-CM | POA: Insufficient documentation

## 2020-05-13 DIAGNOSIS — E7800 Pure hypercholesterolemia, unspecified: Secondary | ICD-10-CM | POA: Diagnosis not present

## 2020-05-13 DIAGNOSIS — R7989 Other specified abnormal findings of blood chemistry: Secondary | ICD-10-CM | POA: Diagnosis not present

## 2020-05-13 DIAGNOSIS — N401 Enlarged prostate with lower urinary tract symptoms: Secondary | ICD-10-CM | POA: Diagnosis not present

## 2020-05-13 DIAGNOSIS — F5101 Primary insomnia: Secondary | ICD-10-CM | POA: Diagnosis not present

## 2020-05-13 DIAGNOSIS — R351 Nocturia: Secondary | ICD-10-CM | POA: Diagnosis not present

## 2020-05-13 DIAGNOSIS — E782 Mixed hyperlipidemia: Secondary | ICD-10-CM | POA: Insufficient documentation

## 2020-05-15 DIAGNOSIS — R351 Nocturia: Secondary | ICD-10-CM | POA: Diagnosis not present

## 2020-05-15 DIAGNOSIS — R7989 Other specified abnormal findings of blood chemistry: Secondary | ICD-10-CM | POA: Diagnosis not present

## 2020-05-15 DIAGNOSIS — Z125 Encounter for screening for malignant neoplasm of prostate: Secondary | ICD-10-CM | POA: Diagnosis not present

## 2020-05-15 DIAGNOSIS — E7800 Pure hypercholesterolemia, unspecified: Secondary | ICD-10-CM | POA: Diagnosis not present

## 2020-05-15 DIAGNOSIS — N401 Enlarged prostate with lower urinary tract symptoms: Secondary | ICD-10-CM | POA: Diagnosis not present

## 2020-05-15 LAB — LIPID PANEL
Cholesterol: 165 (ref 0–200)
HDL: 40 (ref 35–70)
LDL Cholesterol: 83
Triglycerides: 210 — AB (ref 40–160)

## 2020-05-15 LAB — CBC AND DIFFERENTIAL
HCT: 44 (ref 41–53)
Hemoglobin: 15.6 (ref 13.5–17.5)
Neutrophils Absolute: 3.05
Platelets: 127 — AB (ref 150–399)
WBC: 5.8

## 2020-05-15 LAB — HEPATIC FUNCTION PANEL
ALT: 34 (ref 10–40)
AST: 29 (ref 14–40)
Alkaline Phosphatase: 64 (ref 25–125)
Bilirubin, Total: 1.6

## 2020-05-15 LAB — COMPREHENSIVE METABOLIC PANEL
Albumin: 4.6 (ref 3.5–5.0)
Calcium: 10 (ref 8.7–10.7)

## 2020-05-15 LAB — BASIC METABOLIC PANEL
BUN: 16 (ref 4–21)
CO2: 29 — AB (ref 13–22)
Chloride: 105 (ref 99–108)
Creatinine: 0.9 (ref 0.6–1.3)
Glucose: 94
Potassium: 4.7 (ref 3.4–5.3)
Sodium: 137 (ref 137–147)

## 2020-05-15 LAB — CBC: RBC: 4.81 (ref 3.87–5.11)

## 2020-05-15 LAB — PSA: PSA: 0.15

## 2020-05-16 ENCOUNTER — Ambulatory Visit: Payer: Medicare PPO | Admitting: Pulmonary Disease

## 2020-05-19 DIAGNOSIS — D696 Thrombocytopenia, unspecified: Secondary | ICD-10-CM | POA: Insufficient documentation

## 2020-05-22 ENCOUNTER — Ambulatory Visit: Payer: Medicare PPO | Admitting: Urology

## 2020-05-23 ENCOUNTER — Other Ambulatory Visit: Payer: Self-pay

## 2020-05-23 ENCOUNTER — Ambulatory Visit: Payer: Medicare PPO | Admitting: Urology

## 2020-05-23 ENCOUNTER — Encounter: Payer: Self-pay | Admitting: Urology

## 2020-05-23 VITALS — BP 160/93 | HR 60 | Ht 73.0 in | Wt 213.1 lb

## 2020-05-23 DIAGNOSIS — R351 Nocturia: Secondary | ICD-10-CM | POA: Diagnosis not present

## 2020-05-23 DIAGNOSIS — N401 Enlarged prostate with lower urinary tract symptoms: Secondary | ICD-10-CM | POA: Diagnosis not present

## 2020-05-23 LAB — BLADDER SCAN AMB NON-IMAGING

## 2020-05-23 NOTE — Progress Notes (Signed)
° °  05/23/20 5:34 PM   Festus Aloe Owens Shark Mar 05, 1946 [id number]  CC: Nocturia  HPI: I saw Mr. Joshua Mercado in urology clinic today for evaluation of nocturia 2-5 times per night.  He is a 74 year old male with a history of BPH on Flomax and finasteride he reports worsening nocturia over the last few months.  He has known sleep apnea, and is non-compliant with CPAP.  He feels he has a deviated septum which is responsible for his sleep apnea, and he has an upcoming appointment with ENT.  He also recently resumed testosterone with his PCP, and he is wondering if this could have worsened his urinary symptoms.  He denies any urinary complaints during the day, and his urinary symptoms during the day have not changed during this time.  He takes Cialis as needed for ED with good results.  He denies any gross hematuria, dysuria, or flank pain.  PSA is well within the normal range at 0.15 on 05/15/2020, urinalysis today is benign with 0-5 WBCs, 0-2 RBCs, no bacteria, nitrate negative, and PVR is normal at 11 mL.   PMH: Past Medical History:  Diagnosis Date   Arthritis    Knees   COPD (chronic obstructive pulmonary disease) ([place])    Does use hearing aid    Bilateral   Enlarged prostate    Hypercholesterolemia     Surgical History: Past Surgical History:  Procedure Laterality Date   COLONOSCOPY  2017   Dr. Madolyn Frieze    Family History: Family History  Problem Relation Age of Onset   Alzheimer's disease Mother    Diabetes Mother    Heart failure Father    Heart attack Father    Diabetes Father    Dementia Father     Social History:  reports that he quit smoking about 41 years ago. His smoking use included cigarettes. He has a 35.00 pack-year smoking history. He has never used smokeless tobacco. He reports previous alcohol use. He reports that he does not use drugs.  Physical Exam: BP (!) 160/93 (BP Location: Left Arm, Patient Position: Sitting, Cuff Size: Large)    Pulse 60    Ht 6\' 1"  (1.854  m)    Wt 213 lb 1.6 oz (96.7 kg)    BMI 28.12 kg/m    Constitutional:  Alert and oriented, No acute distress. Cardiovascular: No clubbing, cyanosis, or edema. Respiratory: Normal respiratory effort, no increased work of breathing. GI: Abdomen is soft, nontender, nondistended, no abdominal masses GU: Phallus with patent meatus, no lesions DRE: 20 g, smooth, no nodules or masses  Laboratory Data: Reviewed, see HPI  Pertinent Imaging: None to review  Assessment & Plan:   He is a 74 year old male with a long history of BPH that has been well controlled on Flomax and finasteride.  His primary complaint today is worsening nocturia 2-5 times per night.  He also has sleep apnea and is noncompliant with CPAP.  We discussed at length the relationship between sleep apnea and nocturia, and I highly suspect this is the etiology of his urinary symptoms.  We also discussed behavioral strategies including minimizing fluids 3 to 4 hours before bedtime, and double voiding prior to bed.    Follow-up with urology as needed  Nickolas Madrid, MD 05/23/2020  [facility] [address], [place], [place] [address] [phone]

## 2020-05-23 NOTE — Patient Instructions (Signed)
Minimize fluid intake 3-4 hours prior to bed and try to void twice before bed  Sleep Apnea Sleep apnea affects breathing during sleep. It causes breathing to stop for a short time or to become shallow. It can also increase the risk of:  Heart attack.  Stroke.  Being very overweight (obese).  Diabetes.  Heart failure.  Irregular heartbeat. The goal of treatment is to help you breathe normally again. What are the causes? There are three kinds of sleep apnea:  Obstructive sleep apnea. This is caused by a blocked or collapsed airway.  Central sleep apnea. This happens when the brain does not send the right signals to the muscles that control breathing.  Mixed sleep apnea. This is a combination of obstructive and central sleep apnea. The most common cause of this condition is a collapsed or blocked airway. This can happen if:  Your throat muscles are too relaxed.  Your tongue and tonsils are too large.  You are overweight.  Your airway is too small. What increases the risk?  Being overweight.  Smoking.  Having a small airway.  Being older.  Being male.  Drinking alcohol.  Taking medicines to calm yourself (sedatives or tranquilizers).  Having family members with the condition. What are the signs or symptoms?  Trouble staying asleep.  Being sleepy or tired during the day.  Getting angry a lot.  Loud snoring.  Headaches in the morning.  Not being able to focus your mind (concentrate).  Forgetting things.  Less interest in sex.  Mood swings.  Personality changes.  Feelings of sadness (depression).  Waking up a lot during the night to pee (urinate).  Dry mouth.  Sore throat. How is this diagnosed?  Your medical history.  A physical exam.  A test that is done when you are sleeping (sleep study). The test is most often done in a sleep lab but may also be done at home. How is this treated?   Sleeping on your side.  Using a medicine to  get rid of mucus in your nose (decongestant).  Avoiding the use of alcohol, medicines to help you relax, or certain pain medicines (narcotics).  Losing weight, if needed.  Changing your diet.  Not smoking.  Using a machine to open your airway while you sleep, such as: ? An oral appliance. This is a mouthpiece that shifts your lower jaw forward. ? A CPAP device. This device blows air through a mask when you breathe out (exhale). ? An EPAP device. This has valves that you put in each nostril. ? A BPAP device. This device blows air through a mask when you breathe in (inhale) and breathe out.  Having surgery if other treatments do not work. It is important to get treatment for sleep apnea. Without treatment, it can lead to:  High blood pressure.  Coronary artery disease.  In men, not being able to have an erection (impotence).  Reduced thinking ability. Follow these instructions at home: Lifestyle  Make changes that your doctor recommends.  Eat a healthy diet.  Lose weight if needed.  Avoid alcohol, medicines to help you relax, and some pain medicines.  Do not use any products that contain nicotine or tobacco, such as cigarettes, e-cigarettes, and chewing tobacco. If you need help quitting, ask your doctor. General instructions  Take over-the-counter and prescription medicines only as told by your doctor.  If you were given a machine to use while you sleep, use it only as told by your doctor.  If you are having surgery, make sure to tell your doctor you have sleep apnea. You may need to bring your device with you.  Keep all follow-up visits as told by your doctor. This is important. Contact a doctor if:  The machine that you were given to use during sleep bothers you or does not seem to be working.  You do not get better.  You get worse. Get help right away if:  Your chest hurts.  You have trouble breathing in enough air.  You have an uncomfortable feeling in  your back, arms, or stomach.  You have trouble talking.  One side of your body feels weak.  A part of your face is hanging down. These symptoms may be an emergency. Do not wait to see if the symptoms will go away. Get medical help right away. Call your local emergency services (911 in the [place] Do not drive yourself to the hospital. Summary  This condition affects breathing during sleep.  The most common cause is a collapsed or blocked airway.  The goal of treatment is to help you breathe normally while you sleep. This information is not intended to replace advice given to you by your health care provider. Make sure you discuss any questions you have with your health care provider. Document Revised: 04/15/2018 Document Reviewed: 02/22/2018 Elsevier Patient Education  [place].

## 2020-05-24 LAB — URINALYSIS, COMPLETE
Bilirubin, UA: NEGATIVE
Glucose, UA: NEGATIVE
Ketones, UA: NEGATIVE
Leukocytes,UA: NEGATIVE
Nitrite, UA: NEGATIVE
Protein,UA: NEGATIVE
RBC, UA: NEGATIVE
Specific Gravity, UA: 1.02 (ref 1.005–1.030)
Urobilinogen, Ur: 0.2 mg/dL (ref 0.2–1.0)
pH, UA: 5.5 (ref 5.0–7.5)

## 2020-05-24 LAB — MICROSCOPIC EXAMINATION
Bacteria, UA: NONE SEEN
Epithelial Cells (non renal): NONE SEEN /hpf (ref 0–10)

## 2020-05-29 ENCOUNTER — Other Ambulatory Visit: Payer: Self-pay | Admitting: Family

## 2020-05-29 ENCOUNTER — Other Ambulatory Visit: Payer: Self-pay | Admitting: Nurse Practitioner

## 2020-05-29 DIAGNOSIS — J302 Other seasonal allergic rhinitis: Secondary | ICD-10-CM

## 2020-05-29 DIAGNOSIS — R252 Cramp and spasm: Secondary | ICD-10-CM

## 2020-05-30 ENCOUNTER — Other Ambulatory Visit: Payer: Self-pay | Admitting: Nurse Practitioner

## 2020-05-30 DIAGNOSIS — M170 Bilateral primary osteoarthritis of knee: Secondary | ICD-10-CM

## 2020-07-23 ENCOUNTER — Ambulatory Visit: Payer: Self-pay | Admitting: Nurse Practitioner

## 2020-07-23 ENCOUNTER — Encounter: Payer: Self-pay | Admitting: Nurse Practitioner

## 2020-07-23 ENCOUNTER — Other Ambulatory Visit: Payer: Self-pay

## 2020-07-23 ENCOUNTER — Ambulatory Visit: Payer: Medicare Other | Admitting: Nurse Practitioner

## 2020-07-23 VITALS — BP 130/80 | HR 42 | Temp 97.7°F | Ht 73.0 in | Wt 214.0 lb

## 2020-07-23 DIAGNOSIS — G4733 Obstructive sleep apnea (adult) (pediatric): Secondary | ICD-10-CM | POA: Diagnosis not present

## 2020-07-23 DIAGNOSIS — R351 Nocturia: Secondary | ICD-10-CM

## 2020-07-23 DIAGNOSIS — E782 Mixed hyperlipidemia: Secondary | ICD-10-CM

## 2020-07-23 DIAGNOSIS — J342 Deviated nasal septum: Secondary | ICD-10-CM

## 2020-07-23 DIAGNOSIS — J449 Chronic obstructive pulmonary disease, unspecified: Secondary | ICD-10-CM | POA: Diagnosis not present

## 2020-07-23 DIAGNOSIS — I10 Essential (primary) hypertension: Secondary | ICD-10-CM

## 2020-07-23 DIAGNOSIS — Z9989 Dependence on other enabling machines and devices: Secondary | ICD-10-CM

## 2020-07-23 DIAGNOSIS — N401 Enlarged prostate with lower urinary tract symptoms: Secondary | ICD-10-CM

## 2020-07-23 DIAGNOSIS — F5101 Primary insomnia: Secondary | ICD-10-CM

## 2020-07-23 NOTE — Progress Notes (Signed)
Careteam: Patient Care Team: Lauree Chandler, NP as PCP - General (Geriatric Medicine) Kate Sable, MD as PCP - Cardiology (Cardiology)  Advanced Directive information Does Patient Have a Medical Advance Directive?: Yes, Type of Advance Directive: Out of facility DNR (pink MOST or yellow form), Pre-existing out of facility DNR order (yellow form or pink MOST form): Pink MOST form placed in chart (order not valid for inpatient use), Does patient want to make changes to medical advance directive?: No - Patient declined  Allergies  Allergen Reactions  . Bee Pollen Anaphylaxis    Bee Venom  . Bee Venom     Throat/mouth swelling    Chief Complaint  Patient presents with  . Medical Management of Chronic Issues    6 month follow up visit.Patent states that he is receiving injections in his knees, not steroid,but lubricant. Patient also states he is scheduled to see ENT on January 17 and they will be discussing septum deviation correction. Discuss COVID booster     HPI: Patient is a 75 y.o. male seen in today at the [facility] for routine follow up.  Had updated labs in November, went to meet a new PCP because his wife wanted him to but reports he would prefer to stay with Luther at [facility].   BPH- followed by urology, suspects worsening nocturia due to noncompliance with CPAP  COPD- controlled on Spiriva, only uses albuterol once or twice a week.    OSA on CPAP- continues to not wear CPAP. Has deviated septum.  Plans to correct deviated septum.   Reports issues with swallowing, feels like something is stuck in his throat and triggers cough reflex. Reports he ran a scope down and saw a small soft mass that was causing this.   Hyperlipidemia-  Insomnia- could not tolerate trazodone which was recommended, uses temazepam 1-2 times a month    Review of Systems:  Review of Systems  Constitutional: Negative for chills, fever and weight loss.  HENT: Negative for  tinnitus.   Respiratory: Negative for cough, sputum production and shortness of breath.   Cardiovascular: Negative for chest pain, palpitations and leg swelling.  Gastrointestinal: Negative for abdominal pain, constipation, diarrhea and heartburn.  Genitourinary: Positive for frequency. Negative for dysuria and urgency.       Nocuria   Musculoskeletal: Positive for joint pain (knees). Negative for back pain, falls and myalgias.  Skin: Negative.   Neurological: Negative for dizziness and headaches.  Psychiatric/Behavioral: Negative for depression and memory loss. The patient does not have insomnia.     Past Medical History:  Diagnosis Date  . Arthritis    Knees  . COPD (chronic obstructive pulmonary disease) ([place])   . Does use hearing aid    Bilateral  . Enlarged prostate   . Hypercholesterolemia    Past Surgical History:  Procedure Laterality Date  . COLONOSCOPY  2017   Dr. Madolyn Frieze   Social History:   reports that he quit smoking about 42 years ago. His smoking use included cigarettes. He has a 35.00 pack-year smoking history. He has never used smokeless tobacco. He reports previous alcohol use. He reports that he does not use drugs.  Family History  Problem Relation Age of Onset  . Alzheimer's disease Mother   . Diabetes Mother   . Heart failure Father   . Heart attack Father   . Diabetes Father   . Dementia Father     Medications: Patient's Medications  New Prescriptions  No medications on file  Previous Medications   ACETAMINOPHEN (TYLENOL) 500 MG TABLET    Take 500 mg by mouth as needed.   ALBUTEROL (PROVENTIL) (2.5 MG/3ML) 0.083% NEBULIZER SOLUTION    Take 3 mLs (2.5 mg total) by nebulization daily as needed for wheezing or shortness of breath.   ALBUTEROL (VENTOLIN HFA) 108 (90 BASE) MCG/ACT INHALER    Inhale 2 puffs into the lungs every 4 (four) hours as needed for wheezing or shortness of breath.   APOAEQUORIN (PREVAGEN) 10 MG CAPS    Take 1 capsule by mouth  daily.    ASCORBIC ACID (VITAMIN C) 1000 MG TABLET    Take 1,000 mg by mouth daily.   ASPIRIN EC 81 MG TABLET    Take 81 mg by mouth daily.   B COMPLEX-C (SUPER B-COMPLEX + VITAMIN C PO)    Take by mouth.   BIOTIN 1000 MCG TABLET    Take 1,000 mcg by mouth daily.    CALCIUM ELEMENTAL AS CARBONATE (BARIATRIC TUMS ULTRA) 400 MG CHEWABLE TABLET    Chew 1,000 mg by mouth daily as needed.    CETAPHIL (CETAPHIL) CREAM    Apply topically as needed.   CHOLECALCIFEROL (VITAMIN D) 50 MCG (2000 UT) TABLET    Take 2,000 Units by mouth daily. 2 daily   COD LIVER OIL/VITAMINS A & D CAPS    Take by mouth.   CYCLOBENZAPRINE (FLEXERIL) 10 MG TABLET    TAKE 1 TABLET TWICE DAILY AS NEEDED FOR MUSCLE SPASMS   FAMOTIDINE (PEPCID) 20 MG TABLET    Take 20 mg by mouth.   FINASTERIDE (PROSCAR) 5 MG TABLET    Take 1 tablet (5 mg total) by mouth daily.   FLUTICASONE (FLONASE) 50 MCG/ACT NASAL SPRAY    USE 1 SPRAY IN EACH NOSTRIL EVERY DAY   GARLIC 123XX123 MG CAPS    Take by mouth.   LORATADINE (CLARITIN) 10 MG TABLET    Take 1 tablet (10 mg total) by mouth daily.   MELATONIN 3 MG TABS    Take 5 mg by mouth at bedtime.    METHYLSULFONYLMETHANE (MSM) 1000 MG CAPS    Take 1 capsule by mouth 2 (two) times daily.   METHYLSULFONYLMETHANE (MSM) 1000 MG CAPS    Take 1,000 mg by mouth in the morning and at bedtime.   MILK THISTLE 175 MG TABLET    Take 175 mg by mouth daily.   MISC NATURAL PRODUCTS (OSTEO BI-FLEX ADV TRIPLE ST) TABS    Take by mouth.   MULTIPLE VITAMINS-MINERALS (ONE DAILY MENS HEALTH) TABS    Take by mouth.   NABUMETONE (RELAFEN) 750 MG TABLET    TAKE 2 TABLETS EVERY DAY   OMEGA-3 1000 MG CAPS    Take by mouth.   SAW PALMETTO 450 MG CAPS    Take by mouth.   SELENIUM 200 MCG TABS TABLET    Take by mouth daily.   SIMVASTATIN (ZOCOR) 40 MG TABLET    Take 1 tablet (40 mg total) by mouth daily.   TADALAFIL (CIALIS) 20 MG TABLET    Take 1 tablet (20 mg total) by mouth daily as needed for erectile dysfunction.    TAMSULOSIN (FLOMAX) 0.4 MG CAPS CAPSULE    Take 1 capsule (0.4 mg total) by mouth daily.   TEMAZEPAM (RESTORIL) 15 MG CAPSULE    Take 1 capsule (15 mg total) by mouth at bedtime as needed for sleep.   TIOTROPIUM BROMIDE MONOHYDRATE (SPIRIVA RESPIMAT) 2.5  MCG/ACT AERS    Inhale 2 puffs into the lungs daily.   VITAMIN B-12 (CYANOCOBALAMIN) 500 MCG TABLET    Take 500 mcg by mouth daily.   VITAMIN E 180 MG CAPS    Take by mouth.  Modified Medications   No medications on file  Discontinued Medications   GLUCOSAMINE-CHONDROITIN-MSM PO    Take by mouth daily.    Physical Exam:  Vitals:   07/23/20 1202  BP: 130/80  Pulse: (!) 42  Temp: 97.7 F (36.5 C)  TempSrc: Oral  SpO2: 96%  Weight: 214 lb (97.1 kg)  Height: 6\' 1"  (1.854 m)   Body mass index is 28.23 kg/m. Wt Readings from Last 3 Encounters:  07/23/20 214 lb (97.1 kg)  05/23/20 213 lb 1.6 oz (96.7 kg)  03/21/20 206 lb 9.6 oz (93.7 kg)    Physical Exam Constitutional:      General: He is not in acute distress.    Appearance: He is well-developed and well-nourished. He is not diaphoretic.  HENT:     Head: Normocephalic and atraumatic.     Mouth/Throat:     Mouth: Oropharynx is clear and moist.  Eyes:     Extraocular Movements: EOM normal.     Conjunctiva/sclera: Conjunctivae normal.     Pupils: Pupils are equal, round, and reactive to light.  Cardiovascular:     Rate and Rhythm: Normal rate and regular rhythm.     Heart sounds: Normal heart sounds.  Pulmonary:     Effort: Pulmonary effort is normal.     Breath sounds: Normal breath sounds.  Abdominal:     General: Bowel sounds are normal.     Palpations: Abdomen is soft.  Musculoskeletal:        General: No tenderness or edema.     Cervical back: Normal range of motion and neck supple.  Skin:    General: Skin is warm and dry.  Neurological:     Mental Status: He is alert and oriented to person, place, and time.  Psychiatric:        Mood and Affect: Mood and  affect and mood normal.        Behavior: Behavior normal.     Labs reviewed: Basic Metabolic Panel: Recent Labs    08/21/19 0000 03/21/20 1005 05/15/20 0000  NA 139  --  137  K 4.3  --  4.7  CL 106  --  105  CO2 25*  --  29*  BUN 22*  --  16  CREATININE 0.8 0.90 0.9  CALCIUM 9.7  --  10.0   Liver Function Tests: Recent Labs    08/21/19 0000 05/15/20 0000  AST 21 29  ALT 21 34  ALKPHOS 63 64  ALBUMIN 4.3 4.6   No results for input(s): LIPASE, AMYLASE in the last 8760 hours. No results for input(s): AMMONIA in the last 8760 hours. CBC: Recent Labs    08/21/19 0000 05/15/20 0000  WBC 5.3 5.8  NEUTROABS 2,337 3.05  HGB 16.1 15.6  HCT 45 44  PLT 122* 127*   Lipid Panel: Recent Labs    08/21/19 0000 05/15/20 0000  CHOL 162 165  HDL 38 40  LDLCALC 103 83  TRIG 118 210*   TSH: No results for input(s): TSH in the last 8760 hours. A1C: No results found for: HGBA1C   Assessment/Plan 1. OSA on CPAP Continues to have trouble with the CPAP due to deviated septum. Does not wear routinely   2. Essential  hypertension -controlled with dietary modifications   3. Deviated nasal septum Followed by ENT, will request records at this time.   4. Chronic obstructive pulmonary disease, unspecified COPD type ([place]) -stable, improved chest congestion with spiriva, continues albuterol PRN  5. Mixed hyperlipidemia -LDL at goal on zocor continue dietary modifications.   6. Primary insomnia Stable, continues to use  flomax and proscar   7. BPH associated with nocturia Stable, followed by urology, he was educated that compliance with cpap would likely improve nocturia symptoms.   Next appt: 6 months, labs prior  Isaia Hassell K. [place], [facility] [phone]

## 2020-08-14 ENCOUNTER — Other Ambulatory Visit: Payer: Self-pay | Admitting: Nurse Practitioner

## 2020-08-14 ENCOUNTER — Telehealth: Payer: Self-pay | Admitting: *Deleted

## 2020-08-14 MED ORDER — CLOTRIMAZOLE-BETAMETHASONE 1-0.05 % EX CREA: 1.0000 "application " | TOPICAL_CREAM | Freq: Two times a day (BID) | CUTANEOUS | 2 refills | Status: DC

## 2020-08-14 NOTE — Telephone Encounter (Signed)
Refill provided

## 2020-08-14 NOTE — Telephone Encounter (Signed)
Patient called requesting a refill on a cream he stated that he has for Jock Itch.  Medication is not in current medication list.  Stated that he has a tube and needs it refilled.  Clotrimazole Betamethasone 45grams.   Wants it sent to [facility].  Please Advise.

## 2020-08-19 ENCOUNTER — Other Ambulatory Visit: Payer: Self-pay | Admitting: Otolaryngology

## 2020-08-19 DIAGNOSIS — R1312 Dysphagia, oropharyngeal phase: Secondary | ICD-10-CM

## 2020-08-21 ENCOUNTER — Telehealth: Payer: Self-pay

## 2020-08-21 NOTE — Telephone Encounter (Signed)
Incoming call received from [facility] with [facility] checking the status of a surgical clearance form faxed x 2.  I informed Nadine patient has an appointment tomorrow, Janett Billow will complete form following visit. I spoke with Jessica's CMA for [facility] and she has a copy, other copy given to [place] as a back-up

## 2020-08-22 ENCOUNTER — Ambulatory Visit: Payer: Medicare Other | Admitting: Nurse Practitioner

## 2020-08-22 ENCOUNTER — Encounter: Payer: Self-pay | Admitting: Nurse Practitioner

## 2020-08-22 ENCOUNTER — Other Ambulatory Visit: Payer: Self-pay

## 2020-08-22 VITALS — BP 120/80 | HR 55 | Temp 98.3°F | Ht 73.0 in | Wt 214.0 lb

## 2020-08-22 DIAGNOSIS — G4733 Obstructive sleep apnea (adult) (pediatric): Secondary | ICD-10-CM

## 2020-08-22 DIAGNOSIS — J449 Chronic obstructive pulmonary disease, unspecified: Secondary | ICD-10-CM

## 2020-08-22 DIAGNOSIS — I10 Essential (primary) hypertension: Secondary | ICD-10-CM

## 2020-08-22 DIAGNOSIS — D696 Thrombocytopenia, unspecified: Secondary | ICD-10-CM

## 2020-08-22 DIAGNOSIS — Z01818 Encounter for other preprocedural examination: Secondary | ICD-10-CM

## 2020-08-22 DIAGNOSIS — I499 Cardiac arrhythmia, unspecified: Secondary | ICD-10-CM

## 2020-08-22 NOTE — Progress Notes (Signed)
Careteam: Patient Care Team: Lauree Chandler, NP as PCP - General (Geriatric Medicine) Kate Sable, MD as PCP - Cardiology (Cardiology)  Advanced Directive information    Allergies  Allergen Reactions  . Bee Pollen Anaphylaxis    Bee Venom  . Bee Venom     Throat/mouth swelling    Chief Complaint  Patient presents with  . Acute Visit    Patient needs clearance for ENT surgery.     HPI: Patient is a 75 y.o. male seen in today at the [facility] for pre-op evaluation. Plans to have a septoplasty and inferior turbinate reduction. Feels like it has been a while since he has been "put to sleep" for a procedure.  No hx of complications with anesthesia.  Reports overall breathing is "some better" COPD is there to stay.  No shortness of breath. No wheezing, cough or congestion at this time. Rarely needs albuterol.  Continues to do vigorous  exercise 3 days ago.  No chest pains. No palpitations.  No swelling to LE.  Overall feeling well.  No headaches, blurred vision, dizziness.  Plans to go in for lab work pre-op.   Review of Systems:  Review of Systems  Constitutional: Negative for chills, fever and weight loss.  HENT: Negative for tinnitus.   Respiratory: Negative for cough, sputum production and shortness of breath.   Cardiovascular: Negative for chest pain, palpitations and leg swelling.  Gastrointestinal: Negative for abdominal pain, constipation, diarrhea and heartburn.  Genitourinary: Negative for dysuria, frequency and urgency.  Musculoskeletal: Negative for back pain, falls, joint pain and myalgias.  Skin: Negative.   Neurological: Negative for dizziness and headaches.  Psychiatric/Behavioral: Negative for depression and memory loss. The patient does not have insomnia.     Past Medical History:  Diagnosis Date  . Arthritis    Knees  . COPD (chronic obstructive pulmonary disease) ([place])   . Does use hearing aid    Bilateral  . Enlarged  prostate   . Hypercholesterolemia    Past Surgical History:  Procedure Laterality Date  . COLONOSCOPY  2017   Dr. Madolyn Frieze   Social History:   reports that he quit smoking about 42 years ago. His smoking use included cigarettes. He has a 35.00 pack-year smoking history. He has never used smokeless tobacco. He reports previous alcohol use. He reports that he does not use drugs.  Family History  Problem Relation Age of Onset  . Alzheimer's disease Mother   . Diabetes Mother   . Heart failure Father   . Heart attack Father   . Diabetes Father   . Dementia Father     Medications: Patient's Medications  New Prescriptions   No medications on file  Previous Medications   ACETAMINOPHEN (TYLENOL) 500 MG TABLET    Take 500 mg by mouth as needed.   ALBUTEROL (PROVENTIL) (2.5 MG/3ML) 0.083% NEBULIZER SOLUTION    Take 3 mLs (2.5 mg total) by nebulization daily as needed for wheezing or shortness of breath.   ALBUTEROL (VENTOLIN HFA) 108 (90 BASE) MCG/ACT INHALER    Inhale 2 puffs into the lungs every 4 (four) hours as needed for wheezing or shortness of breath.   APOAEQUORIN (PREVAGEN) 10 MG CAPS    Take 1 capsule by mouth daily.    ASCORBIC ACID (VITAMIN C) 1000 MG TABLET    Take 1,000 mg by mouth daily.   ASPIRIN EC 81 MG TABLET    Take 81 mg by mouth daily.  B COMPLEX-C (SUPER B-COMPLEX + VITAMIN C PO)    Take by mouth.   BIOTIN 1000 MCG TABLET    Take 1,000 mcg by mouth daily.    CALCIUM ELEMENTAL AS CARBONATE (BARIATRIC TUMS ULTRA) 400 MG CHEWABLE TABLET    Chew 1,000 mg by mouth daily as needed.    CETAPHIL (CETAPHIL) CREAM    Apply topically as needed.   CHOLECALCIFEROL (VITAMIN D) 50 MCG (2000 UT) TABLET    Take 2,000 Units by mouth daily. 2 daily   CLOTRIMAZOLE-BETAMETHASONE (LOTRISONE) CREAM    Apply 1 application topically 2 (two) times daily.   COD LIVER OIL/VITAMINS A & D CAPS    Take by mouth.   CYCLOBENZAPRINE (FLEXERIL) 10 MG TABLET    TAKE 1 TABLET TWICE DAILY AS NEEDED FOR  MUSCLE SPASMS   FAMOTIDINE (PEPCID) 20 MG TABLET    Take 20 mg by mouth.   FINASTERIDE (PROSCAR) 5 MG TABLET    Take 1 tablet (5 mg total) by mouth daily.   FLUTICASONE (FLONASE) 50 MCG/ACT NASAL SPRAY    USE 1 SPRAY IN EACH NOSTRIL EVERY DAY   GARLIC 5027 MG CAPS    Take by mouth.   LORATADINE (CLARITIN) 10 MG TABLET    Take 1 tablet (10 mg total) by mouth daily.   MELATONIN 5 MG TABS    Take 5 mg by mouth at bedtime.    METHYLSULFONYLMETHANE (MSM) 1000 MG CAPS    Take 1 capsule by mouth 2 (two) times daily.   MISC NATURAL PRODUCTS (OSTEO BI-FLEX ADV TRIPLE ST) TABS    Take by mouth.   MULTIPLE VITAMINS-MINERALS (ONE DAILY MENS HEALTH) TABS    Take by mouth.   NABUMETONE (RELAFEN) 750 MG TABLET    TAKE 2 TABLETS EVERY DAY   OMEGA-3 1000 MG CAPS    Take by mouth.   SAW PALMETTO 450 MG CAPS    Take by mouth.   SELENIUM 200 MCG TABS TABLET    Take by mouth daily.   SIMVASTATIN (ZOCOR) 40 MG TABLET    Take 1 tablet (40 mg total) by mouth daily.   TADALAFIL (CIALIS) 20 MG TABLET    Take 1 tablet (20 mg total) by mouth daily as needed for erectile dysfunction.   TAMSULOSIN (FLOMAX) 0.4 MG CAPS CAPSULE    Take 1 capsule (0.4 mg total) by mouth daily.   TEMAZEPAM (RESTORIL) 15 MG CAPSULE    Take 1 capsule (15 mg total) by mouth at bedtime as needed for sleep.   TIOTROPIUM BROMIDE MONOHYDRATE (SPIRIVA RESPIMAT) 2.5 MCG/ACT AERS    Inhale 2 puffs into the lungs daily.   VITAMIN B-12 (CYANOCOBALAMIN) 500 MCG TABLET    Take 500 mcg by mouth daily.   VITAMIN E 180 MG CAPS    Take by mouth.  Modified Medications   No medications on file  Discontinued Medications   METHYLSULFONYLMETHANE (MSM) 1000 MG CAPS    Take 1,000 mg by mouth in the morning and at bedtime.   MILK THISTLE 175 MG TABLET    Take 175 mg by mouth daily.    Physical Exam:  Vitals:   08/22/20 0943  BP: 120/80  Pulse: (!) 55  Temp: 98.3 F (36.8 C)  TempSrc: Oral  SpO2: 94%  Weight: 214 lb (97.1 kg)  Height: 6\' 1"  (1.854 m)    There is no height or weight on file to calculate BMI. Wt Readings from Last 3 Encounters:  07/23/20 214 lb (97.1 kg)  05/23/20 213 lb 1.6 oz (96.7 kg)  03/21/20 206 lb 9.6 oz (93.7 kg)    Physical Exam Constitutional:      General: He is not in acute distress.    Appearance: He is well-developed and well-nourished. He is not diaphoretic.  HENT:     Head: Normocephalic and atraumatic.     Mouth/Throat:     Mouth: Oropharynx is clear and moist.     Pharynx: No oropharyngeal exudate.  Eyes:     Extraocular Movements: EOM normal.     Conjunctiva/sclera: Conjunctivae normal.     Pupils: Pupils are equal, round, and reactive to light.  Cardiovascular:     Rate and Rhythm: Normal rate and regular rhythm.  Extrasystoles are present.    Heart sounds: Normal heart sounds.  Pulmonary:     Effort: Pulmonary effort is normal.     Breath sounds: Normal breath sounds.  Abdominal:     General: Bowel sounds are normal.     Palpations: Abdomen is soft.  Genitourinary:    Rectum: Guaiac result negative.  Musculoskeletal:        General: No tenderness or edema.     Cervical back: Normal range of motion and neck supple.  Skin:    General: Skin is warm and dry.  Neurological:     Mental Status: He is alert and oriented to person, place, and time.  Psychiatric:        Mood and Affect: Mood and affect normal.     Labs reviewed: Basic Metabolic Panel: Recent Labs    03/21/20 1005 05/15/20 0000  NA  --  137  K  --  4.7  CL  --  105  CO2  --  29*  BUN  --  16  CREATININE 0.90 0.9  CALCIUM  --  10.0   Liver Function Tests: Recent Labs    05/15/20 0000  AST 29  ALT 34  ALKPHOS 64  ALBUMIN 4.6   No results for input(s): LIPASE, AMYLASE in the last 8760 hours. No results for input(s): AMMONIA in the last 8760 hours. CBC: Recent Labs    05/15/20 0000  WBC 5.8  NEUTROABS 3.05  HGB 15.6  HCT 44  PLT 127*   Lipid Panel: Recent Labs    05/15/20 0000  CHOL 165  HDL  40  LDLCALC 83  TRIG 210*   TSH: No results for input(s): TSH in the last 8760 hours. A1C: No results found for: HGBA1C   Assessment/Plan 1. Thrombocytopenia (HCC) -stable, no abnormal bruising or bleeding noted.   2. Essential hypertension Well controlled. Stable with dietary modifications.   3. Chronic obstructive pulmonary disease, unspecified COPD type ([place]) Mild and controlled, followed by pulmonary. Rarely uses albuterol. No recent exacerbations, continues on spiriva 2 puffs daily   4. Moderate obstructive sleep apnea Hoping septoplasty improves symptoms and he will not need cpap.   5. Irregular heart beat -stable at this time. asymptomatic  - EKG 12-Lead unchanged- SR 60 with PACs  6. Preop examination -VS normal. Exam today normal except extrasystoles heard which is baseline for patient. He is without chest pains, palpitations and COPD well controlled without recent exacerbation.  Increase risk factors due to age, COPD, and Thrombocytopenia  Makes him more at risk for bleeding last platelets mildly low at 127 and no signs of abnormal bruising or bleeding. - EKG 12-Lead- at baseline, sinus rhythm with PACS -medically cleared for septoplasty. To hold ASA 10 days prior to procedure.  Next appt: 01/21/2021 Carlos American. [place], [facility] [phone]

## 2020-08-28 ENCOUNTER — Ambulatory Visit: Payer: Medicare PPO

## 2020-09-24 ENCOUNTER — Other Ambulatory Visit: Payer: Self-pay | Admitting: *Deleted

## 2020-09-24 ENCOUNTER — Ambulatory Visit: Payer: Medicare Other

## 2020-09-24 DIAGNOSIS — N401 Enlarged prostate with lower urinary tract symptoms: Secondary | ICD-10-CM

## 2020-09-24 DIAGNOSIS — E782 Mixed hyperlipidemia: Secondary | ICD-10-CM

## 2020-09-24 DIAGNOSIS — R350 Frequency of micturition: Secondary | ICD-10-CM

## 2020-09-24 DIAGNOSIS — J302 Other seasonal allergic rhinitis: Secondary | ICD-10-CM

## 2020-09-24 MED ORDER — TAMSULOSIN HCL 0.4 MG PO CAPS: 0.4000 mg | ORAL_CAPSULE | Freq: Every day | ORAL | 1 refills | Status: DC

## 2020-09-24 MED ORDER — FLUTICASONE PROPIONATE 50 MCG/ACT NA SUSP: NASAL | 3 refills | Status: DC

## 2020-09-24 MED ORDER — FINASTERIDE 5 MG PO TABS: 5.0000 mg | ORAL_TABLET | Freq: Every day | ORAL | 1 refills | Status: DC

## 2020-09-24 MED ORDER — SIMVASTATIN 40 MG PO TABS: 40.0000 mg | ORAL_TABLET | Freq: Every day | ORAL | 1 refills | Status: DC

## 2020-09-24 NOTE — Addendum Note (Signed)
Addended by: Rafael Bihari A on: 09/24/2020 09:48 AM   Modules accepted: Orders

## 2020-09-24 NOTE — Telephone Encounter (Signed)
Received refill Request from [facility].

## 2020-09-24 NOTE — Addendum Note (Signed)
Addended by: Rafael Bihari A on: 09/24/2020 09:46 AM   Modules accepted: Orders

## 2020-09-26 ENCOUNTER — Ambulatory Visit: Payer: Self-pay | Admitting: Nurse Practitioner

## 2020-09-26 ENCOUNTER — Ambulatory Visit: Payer: Medicare Other | Admitting: Nurse Practitioner

## 2020-09-26 ENCOUNTER — Other Ambulatory Visit: Payer: Self-pay

## 2020-09-26 ENCOUNTER — Other Ambulatory Visit: Payer: Self-pay | Admitting: *Deleted

## 2020-09-26 DIAGNOSIS — F5101 Primary insomnia: Secondary | ICD-10-CM

## 2020-09-26 DIAGNOSIS — J449 Chronic obstructive pulmonary disease, unspecified: Secondary | ICD-10-CM

## 2020-09-26 DIAGNOSIS — R252 Cramp and spasm: Secondary | ICD-10-CM

## 2020-09-26 MED ORDER — CYCLOBENZAPRINE HCL 10 MG PO TABS: ORAL_TABLET | ORAL | 1 refills | Status: DC

## 2020-09-26 MED ORDER — TEMAZEPAM 15 MG PO CAPS: 15.0000 mg | ORAL_CAPSULE | Freq: Every evening | ORAL | 3 refills | Status: DC | PRN

## 2020-09-26 MED ORDER — SPIRIVA RESPIMAT 2.5 MCG/ACT IN AERS: 2.0000 | INHALATION_SPRAY | Freq: Every day | RESPIRATORY_TRACT | 2 refills | Status: DC

## 2020-09-26 NOTE — Telephone Encounter (Signed)
Received fax from [place]. Requesting refill.  Pended Rx and sent to [facility] for approval.

## 2020-09-27 ENCOUNTER — Ambulatory Visit: Payer: Medicare Other | Admitting: Nurse Practitioner

## 2020-10-01 ENCOUNTER — Other Ambulatory Visit: Payer: Self-pay

## 2020-10-01 ENCOUNTER — Ambulatory Visit: Payer: Medicare Other | Admitting: Nurse Practitioner

## 2020-10-01 ENCOUNTER — Encounter: Payer: Self-pay | Admitting: Nurse Practitioner

## 2020-10-01 VITALS — BP 130/70 | HR 63 | Temp 98.0°F | Ht 73.0 in | Wt 215.5 lb

## 2020-10-01 DIAGNOSIS — M170 Bilateral primary osteoarthritis of knee: Secondary | ICD-10-CM | POA: Diagnosis not present

## 2020-10-01 DIAGNOSIS — R6882 Decreased libido: Secondary | ICD-10-CM

## 2020-10-01 MED ORDER — NABUMETONE 750 MG PO TABS: 750.0000 mg | ORAL_TABLET | Freq: Two times a day (BID) | ORAL | 1 refills | Status: DC | PRN

## 2020-10-01 NOTE — Progress Notes (Signed)
Careteam: Patient Care Team: Lauree Chandler, NP as PCP - General (Geriatric Medicine) Kate Sable, MD as PCP - Cardiology (Cardiology)  PLACE OF SERVICE:  [place]  Advanced Directive information    Allergies  Allergen Reactions  . Bee Pollen Anaphylaxis    Bee Venom  . Bee Venom     Throat/mouth swelling    Chief Complaint  Patient presents with  . Acute Visit    Patient would like to discuss medications.Patient would like to know if he can get androderm for testosterone.     HPI: Patient is a 75 y.o. male to follow up medication.  He changed insurance companies and then had to change pharmacies so wanted to touch base on all medications since he had requested new Rx.   needs Refill on nabumetone- takes 2 tablets daily, will alternate with tylenol. Tries to avoid taking on empty stomach.   Has previously been on androderm for libito. He saw urologist a few months ago and thought he tested him for testosterone but he did not see results for that. PSA was normal in November November 2021. When he was originally prescribed medication he reports testosterone was borderline low.  reports increase in fatigue with low libito.  His last prescription was written in 2015 for #90 and now has just now run out. Using sparingly due to low mood, energy and libito.  Reports his personality changes a little when his testosterone is low. Has been taking Androderm transdermal system 4 mg/day PRN  Review of Systems:  Review of Systems  Constitutional: Negative for chills, fever and weight loss.  HENT: Negative for tinnitus.   Respiratory: Negative for cough, sputum production and shortness of breath.   Cardiovascular: Negative for chest pain, palpitations and leg swelling.  Gastrointestinal: Negative for constipation, diarrhea and heartburn.  Genitourinary: Negative for dysuria, frequency and urgency.  Musculoskeletal: Positive for joint pain and myalgias. Negative for  back pain and falls.  Skin: Negative.   Neurological: Negative for dizziness and headaches.  Psychiatric/Behavioral: Negative for depression and memory loss. The patient does not have insomnia.     Past Medical History:  Diagnosis Date  . Arthritis    Knees  . COPD (chronic obstructive pulmonary disease) ([place])   . Does use hearing aid    Bilateral  . Enlarged prostate   . Hypercholesterolemia    Past Surgical History:  Procedure Laterality Date  . COLONOSCOPY  2017   Dr. Madolyn Frieze   Social History:   reports that he quit smoking about 42 years ago. His smoking use included cigarettes. He has a 35.00 pack-year smoking history. He has never used smokeless tobacco. He reports previous alcohol use. He reports that he does not use drugs.  Family History  Problem Relation Age of Onset  . Alzheimer's disease Mother   . Diabetes Mother   . Heart failure Father   . Heart attack Father   . Diabetes Father   . Dementia Father     Medications: Patient's Medications  New Prescriptions   No medications on file  Previous Medications   ACETAMINOPHEN (TYLENOL) 500 MG TABLET    Take 500 mg by mouth as needed.   ALBUTEROL (PROVENTIL) (2.5 MG/3ML) 0.083% NEBULIZER SOLUTION    Take 3 mLs (2.5 mg total) by nebulization daily as needed for wheezing or shortness of breath.   ALBUTEROL (VENTOLIN HFA) 108 (90 BASE) MCG/ACT INHALER    Inhale 2 puffs into the lungs every 4 (four) hours  as needed for wheezing or shortness of breath.   APOAEQUORIN (PREVAGEN) 10 MG CAPS    Take 1 capsule by mouth daily.    ASCORBIC ACID (VITAMIN C) 1000 MG TABLET    Take 1,000 mg by mouth daily.   ASPIRIN EC 81 MG TABLET    Take 81 mg by mouth daily.   B COMPLEX-C (SUPER B-COMPLEX + VITAMIN C PO)    Take by mouth.   BIOTIN 1000 MCG TABLET    Take 1,000 mcg by mouth daily.    CALCIUM ELEMENTAL AS CARBONATE (BARIATRIC TUMS ULTRA) 400 MG CHEWABLE TABLET    Chew 1,000 mg by mouth daily as needed.    CETAPHIL (CETAPHIL) CREAM     Apply topically as needed.   CHOLECALCIFEROL (VITAMIN D) 50 MCG (2000 UT) TABLET    Take 2,000 Units by mouth daily. 2 daily   CLOTRIMAZOLE-BETAMETHASONE (LOTRISONE) CREAM    Apply 1 application topically 2 (two) times daily.   COD LIVER OIL/VITAMINS A & D CAPS    Take by mouth.   CYCLOBENZAPRINE (FLEXERIL) 10 MG TABLET    Take one tablet by mouth twice daily as needed for muscle spasms.   FAMOTIDINE (PEPCID) 20 MG TABLET    Take 20 mg by mouth.   FINASTERIDE (PROSCAR) 5 MG TABLET    Take 1 tablet (5 mg total) by mouth daily.   FLUTICASONE (FLONASE) 50 MCG/ACT NASAL SPRAY    USE 1 SPRAY IN EACH NOSTRIL EVERY DAY   GARLIC 3664 MG CAPS    Take by mouth.   LORATADINE (CLARITIN) 10 MG TABLET    Take 1 tablet (10 mg total) by mouth daily.   MELATONIN 5 MG TABS    Take 5 mg by mouth at bedtime.    METHYLSULFONYLMETHANE (MSM) 1000 MG CAPS    Take 1 capsule by mouth 2 (two) times daily.   MISC NATURAL PRODUCTS (OSTEO BI-FLEX ADV TRIPLE ST) TABS    Take by mouth.   MULTIPLE VITAMINS-MINERALS (ONE DAILY MENS HEALTH) TABS    Take by mouth.   NABUMETONE (RELAFEN) 750 MG TABLET    TAKE 2 TABLETS EVERY DAY   OMEGA-3 1000 MG CAPS    Take by mouth.   SAW PALMETTO 450 MG CAPS    Take by mouth.   SELENIUM 200 MCG TABS TABLET    Take by mouth daily.   SIMVASTATIN (ZOCOR) 40 MG TABLET    Take 1 tablet (40 mg total) by mouth daily.   TADALAFIL (CIALIS) 20 MG TABLET    Take 1 tablet (20 mg total) by mouth daily as needed for erectile dysfunction.   TAMSULOSIN (FLOMAX) 0.4 MG CAPS CAPSULE    Take 1 capsule (0.4 mg total) by mouth daily.   TEMAZEPAM (RESTORIL) 15 MG CAPSULE    Take 1 capsule (15 mg total) by mouth at bedtime as needed for sleep.   TIOTROPIUM BROMIDE MONOHYDRATE (SPIRIVA RESPIMAT) 2.5 MCG/ACT AERS    Inhale 2 puffs into the lungs daily.   VITAMIN B-12 (CYANOCOBALAMIN) 500 MCG TABLET    Take 500 mcg by mouth daily.   VITAMIN E 180 MG CAPS    Take by mouth.  Modified Medications   No medications  on file  Discontinued Medications   No medications on file    Physical Exam:  Vitals:   10/01/20 1439  BP: 130/70  Pulse: 63  Temp: 98 F (36.7 C)  TempSrc: Oral  SpO2: 97%  Weight: 215 lb 8 oz (  97.8 kg)  Height: 6\' 1"  (1.854 m)   Body mass index is 28.43 kg/m. Wt Readings from Last 3 Encounters:  10/01/20 215 lb 8 oz (97.8 kg)  08/22/20 214 lb (97.1 kg)  07/23/20 214 lb (97.1 kg)    Physical Exam Constitutional:      General: He is not in acute distress.    Appearance: He is well-developed. He is not diaphoretic.  HENT:     Head: Normocephalic and atraumatic.     Mouth/Throat:     Pharynx: No oropharyngeal exudate.  Eyes:     Conjunctiva/sclera: Conjunctivae normal.     Pupils: Pupils are equal, round, and reactive to light.  Cardiovascular:     Rate and Rhythm: Normal rate and regular rhythm.     Heart sounds: Normal heart sounds.  Pulmonary:     Effort: Pulmonary effort is normal.     Breath sounds: Normal breath sounds.  Abdominal:     General: Bowel sounds are normal.     Palpations: Abdomen is soft.  Musculoskeletal:        General: No tenderness.     Cervical back: Normal range of motion and neck supple.  Skin:    General: Skin is warm and dry.  Neurological:     Mental Status: He is alert and oriented to person, place, and time.     Labs reviewed: Basic Metabolic Panel: Recent Labs    03/21/20 1005 05/15/20 0000  NA  --  137  K  --  4.7  CL  --  105  CO2  --  29*  BUN  --  16  CREATININE 0.90 0.9  CALCIUM  --  10.0   Liver Function Tests: Recent Labs    05/15/20 0000  AST 29  ALT 34  ALKPHOS 64  ALBUMIN 4.6   No results for input(s): LIPASE, AMYLASE in the last 8760 hours. No results for input(s): AMMONIA in the last 8760 hours. CBC: Recent Labs    05/15/20 0000  WBC 5.8  NEUTROABS 3.05  HGB 15.6  HCT 44  PLT 127*   Lipid Panel: Recent Labs    05/15/20 0000  CHOL 165  HDL 40  LDLCALC 83  TRIG 210*   TSH: No  results for input(s): TSH in the last 8760 hours. A1C: No results found for: HGBA1C   Assessment/Plan 1. Primary osteoarthritis of both knees -educated about adverse effects of  NSAIDS. To use sparingly as needed, tylenol 500 mg 1-2 tablets every 8 hours as needed for pain preferred with less adverse effects.  - nabumetone (RELAFEN) 750 MG tablet; Take 1 tablet (750 mg total) by mouth 2 (two) times daily as needed.  Dispense: 180 tablet; Refill: 1  2. Low libido -will order AM testosterone level at this time   Janett Billow K. [place], [facility] [phone]

## 2020-10-08 ENCOUNTER — Ambulatory Visit: Payer: Medicare Other | Admitting: Nurse Practitioner

## 2020-10-08 ENCOUNTER — Other Ambulatory Visit: Payer: Self-pay

## 2020-10-08 DIAGNOSIS — E349 Endocrine disorder, unspecified: Secondary | ICD-10-CM

## 2020-10-08 MED ORDER — TESTOSTERONE 2 MG/24HR TD PT24: MEDICATED_PATCH | TRANSDERMAL | 1 refills | Status: DC

## 2020-10-08 NOTE — Progress Notes (Signed)
Pt came by clinic to get lab results. Testosterone level was 768 Pt report he was taking some of his old patches but has since ran out.  Rx given today.

## 2020-10-10 ENCOUNTER — Other Ambulatory Visit: Payer: Self-pay

## 2020-10-10 ENCOUNTER — Ambulatory Visit
Admission: RE | Admit: 2020-10-10 | Discharge: 2020-10-10 | Disposition: A | Discharge status: Home / Self Care | Payer: Medicare Other | Source: Ambulatory Visit | Attending: Otolaryngology | Admitting: Otolaryngology

## 2020-10-10 DIAGNOSIS — R1312 Dysphagia, oropharyngeal phase: Secondary | ICD-10-CM | POA: Diagnosis not present

## 2020-10-10 NOTE — Therapy (Signed)
Joshua Mercado, [place], [address] Phone: [phone]   Fax:     Modified Barium Swallow  Patient Details  Name: Joshua Mercado MRN: [id number] Date of Birth: Dec 09, 1945 No data recorded  Encounter Date: 10/10/2020   End of Session - 10/10/20 1613    Visit Number 1    Number of Visits 1    Date for SLP Re-Evaluation 10/10/20    SLP Start Time 5462    SLP Stop Time  1335    SLP Time Calculation (min) 40 min    Activity Tolerance Patient tolerated treatment well             There were no vitals filed for this visit.   Subjective Assessment - 10/10/20 1612    Subjective Pt reports difficulty with solids such as raw vegetables, cornbread and other breads    Currently in Pain? No/denies          Objective Swallowing Evaluation: Type of Study: MBS-Modified Barium Swallow Study   Patient Details  Name: Joshua Mercado MRN: [id number] Date of Birth: August 27, 1945  Today's Date: 10/10/2020 Time: SLP Start Time (ACUTE ONLY): 1829 -SLP Stop Time (ACUTE ONLY): 1335  SLP Time Calculation (min) (ACUTE ONLY): 40 min   Past Medical History:  Past Medical History:  Diagnosis Date  . Arthritis    Knees  . COPD (chronic obstructive pulmonary disease) ([place])   . Does use hearing aid    Bilateral  . Enlarged prostate   . Hypercholesterolemia    Past Surgical History:  Past Surgical History:  Procedure Laterality Date  . COLONOSCOPY  2017   Dr. Madolyn Frieze   HPI: [name] is a 75 y.o. male with past medical history noted for HTN, COPD, sleep apnea, prostate cancer, hearing loss, referred by Dr. Pryor Ochoa for MBS. ENT noted benign tonsil mass, mild vocal fold paresis on 03/27/21. Patient reports difficulty with solids, not liquids. He feels sticking just above level of jugular notch. He coughs hard and sometimes feels food come back up. He also reports sensation of pills sticking lower in his esophagus.   No data  recorded   Assessment / Plan / Recommendation  CHL IP CLINICAL IMPRESSIONS 10/10/2020  Clinical Impression Patient presents with mild structural pharyngeal dysphagia and appearance of mild cervical esophageal dysphagia. Oral stage is within normal limits, with adequate bolus preparation, control, rotary mastication and anterior to posterior transfer. Timing of pharyngeal swallow initiation occurs at the level of the base of tongue. Right tonsil mass noted by ENT is observed and does mildly impact flow of bolus as well as apposition of the base of tongue to the posterior pharyngeal wall. This results in mild-moderate residue with solids >liquids (valleculae, pyriform, CP segment). Residue reduces with cued dry swallow. There is no penetration or aspiration. Cervical esophageal phase is noted appearance of osteophytes, as well as abnormal movement of bolus through the cervical esophagus, possibly attributable to soft tissue, per the radiologist. Further evaluation (EGD) is recommended. There is mild-moderate retention in the cervical esophagus (solids>liquids) and intermittent retrograde bolus flow which did not reach the pharynx. An esophageal sweep was performed; pt required several additional swallows of liquid to clear 13 mm barium tablet from thoracic to distal esophagus. Findings reviewed with pt and teachback re: recommendations provided. Recommend pt continue regular diet and thin liquids, may benefit from taking pills whole with applesauce and follow with plenty of liquid. Esophageal precautions reviewed. Pt was advised to  swallow twice with solids and to alternate solids with liquids during meals.  SLP Visit Diagnosis Dysphagia, pharyngeal phase (R13.13);Dysphagia, pharyngoesophageal phase (R13.14)  Attention and concentration deficit following --  Frontal lobe and executive function deficit following --  Impact on safety and function Mild aspiration risk      CHL IP TREATMENT RECOMMENDATION  10/10/2020  Treatment Recommendations No treatment recommended at this time     Prognosis 10/10/2020  Prognosis for Safe Diet Advancement Good  Barriers to Reach Goals --  Barriers/Prognosis Comment --    CHL IP DIET RECOMMENDATION 10/10/2020  SLP Diet Recommendations Regular solids;Thin liquid  Liquid Administration via Cup;Straw  Medication Administration Whole meds with puree  Compensations Slow rate;Small sips/bites;Multiple dry swallows after each bite/sip;Follow solids with liquid  Postural Changes Seated upright at 90 degrees;Remain semi-upright after after feeds/meals (Comment)      CHL IP OTHER RECOMMENDATIONS 10/10/2020  Recommended Consults Consider esophageal assessment;Consider GI evaluation  Oral Care Recommendations Oral care BID  Other Recommendations --      CHL IP FOLLOW UP RECOMMENDATIONS 10/10/2020  Follow up Recommendations None      No flowsheet data found.         CHL IP ORAL PHASE 10/10/2020  Oral Phase WFL  Oral - Pudding Teaspoon --  Oral - Pudding Cup --  Oral - Honey Teaspoon --  Oral - Honey Cup --  Oral - Nectar Teaspoon --  Oral - Nectar Cup --  Oral - Nectar Straw --  Oral - Thin Teaspoon --  Oral - Thin Cup --  Oral - Thin Straw --  Oral - Puree --  Oral - Mech Soft --  Oral - Regular --  Oral - Multi-Consistency --  Oral - Pill --  Oral Phase - Comment --    CHL IP PHARYNGEAL PHASE 10/10/2020  Pharyngeal Phase Impaired  Pharyngeal- Pudding Teaspoon --  Pharyngeal --  Pharyngeal- Pudding Cup --  Pharyngeal --  Pharyngeal- Honey Teaspoon --  Pharyngeal --  Pharyngeal- Honey Cup --  Pharyngeal --  Pharyngeal- Nectar Teaspoon --  Pharyngeal --  Pharyngeal- Nectar Cup Reduced tongue base retraction;Pharyngeal residue - valleculae;Pharyngeal residue - pyriform;Pharyngeal residue - cp segment  Pharyngeal Material does not enter airway  Pharyngeal- Nectar Straw --  Pharyngeal --  Pharyngeal- Thin Teaspoon --  Pharyngeal --   Pharyngeal- Thin Cup Reduced tongue base retraction;Pharyngeal residue - valleculae;Pharyngeal residue - pyriform;Pharyngeal residue - cp segment  Pharyngeal Material does not enter airway  Pharyngeal- Thin Straw --  Pharyngeal --  Pharyngeal- Puree Reduced tongue base retraction;Pharyngeal residue - valleculae;Pharyngeal residue - pyriform;Pharyngeal residue - cp segment  Pharyngeal Material does not enter airway  Pharyngeal- Mechanical Soft Reduced tongue base retraction;Pharyngeal residue - valleculae;Pharyngeal residue - pyriform;Pharyngeal residue - cp segment  Pharyngeal Material does not enter airway  Pharyngeal- Regular --  Pharyngeal --  Pharyngeal- Multi-consistency --  Pharyngeal --  Pharyngeal- Pill Reduced tongue base retraction;Pharyngeal residue - valleculae;Pharyngeal residue - pyriform;Pharyngeal residue - cp segment  Pharyngeal Material does not enter airway  Pharyngeal Comment --     CHL IP CERVICAL ESOPHAGEAL PHASE 10/10/2020  Cervical Esophageal Phase Impaired  Pudding Teaspoon --  Pudding Cup --  Honey Teaspoon --  Honey Cup --  Nectar Teaspoon --  Nectar Cup --  Nectar Straw --  Thin Teaspoon --  Thin Cup Esophageal backflow into cervical esophagus;Other (Comment)  Thin Straw --  Puree Esophageal backflow into cervical esophagus  Mechanical Soft Esophageal backflow into cervical esophagus  Regular --  Multi-consistency --  Pill --  Cervical Esophageal Comment --     Aliene Altes 10/10/2020, 4:30 PM    Deneise Lever, MS, CCC-SLP Speech-Language Pathologist   Oropharyngeal dysphagia - Plan: DG SWALLOW FUNC OP MEDICARE SPEECH PATH, DG SWALLOW FUNC OP MEDICARE SPEECH PATH        Problem List Patient Active Problem List   Diagnosis Date Noted  . Thrombocytopenia ([place]) 05/19/2020  . Benign prostatic hyperplasia with nocturia 05/13/2020  . Low testosterone 05/13/2020  . Other male erectile dysfunction 05/13/2020  . Primary insomnia  05/13/2020  . Mixed hyperlipidemia 05/13/2020  . Chronic obstructive pulmonary disease ([place]) 01/19/2020  . Mixed hyperlipidemia 01/19/2020  . Essential hypertension 01/19/2020  . Benign prostatic hyperplasia with urinary frequency 01/19/2020  . Family hx of prostate cancer 01/19/2020  . Hypersomnia 01/30/2016  . Moderate obstructive sleep apnea 01/30/2016  . Snoring 01/30/2016    Aliene Altes 10/10/2020, 4:30 PM  [facility] [place], [place], [address] Phone: [phone]   Fax:     Name: Alijah Akram MRN: [id number] Date of Birth: 21-Dec-1945

## 2020-10-11 ENCOUNTER — Encounter: Payer: Self-pay | Admitting: *Deleted

## 2020-10-15 ENCOUNTER — Telehealth: Payer: Self-pay

## 2020-10-15 ENCOUNTER — Ambulatory Visit: Payer: Medicare Other | Admitting: Nurse Practitioner

## 2020-10-15 ENCOUNTER — Other Ambulatory Visit: Payer: Self-pay

## 2020-10-15 NOTE — Telephone Encounter (Signed)
Patient would like to have decongestant called in. He states that due to his deviated septum he is having a lot of congestion and is unablke to sleep. He would like to have Claritin D called in to pharmacy for a 90 day supply he states that he only takes it every other day. Please advise  Message routed to Sherrie Mustache, NP

## 2020-10-15 NOTE — Telephone Encounter (Signed)
Would not recommend decongestant- this will raise blood pressure and has a lot of unwanted side effects.  It does dry out the nose but causes issues with drying out eyes, increases risk for urinary retention and constipation. Would recommend using a nasal spray such as Flonase and I can send that Rx in if he would like. He can also consult with specialist.

## 2020-10-15 NOTE — Telephone Encounter (Signed)
Spoke with patient and he verbalized his undestanding of rescommendations. Patient has Flonase nasal spray

## 2020-10-15 NOTE — Telephone Encounter (Signed)
Called and left message for patient with recommendations. Will try calling patient again at a later time.

## 2020-10-31 ENCOUNTER — Other Ambulatory Visit: Payer: Self-pay | Admitting: Nurse Practitioner

## 2020-10-31 MED ORDER — CLOTRIMAZOLE-BETAMETHASONE 1-0.05 % EX CREA: 1.0000 "application " | TOPICAL_CREAM | Freq: Two times a day (BID) | CUTANEOUS | 1 refills | Status: DC

## 2020-10-31 NOTE — Telephone Encounter (Signed)
Patient called and stated that he wants a 90 day supply on his Cream for Clotrimazole-Betamethasone.   Pended Rx and sent to [facility] for approval.

## 2020-10-31 NOTE — Telephone Encounter (Signed)
Patient called and he has a script at [facility] in [place] for an ointment he needs it to be changed to 85mg  because they only have the 230's tubes at [facility].   Im assuming this is the medication he is talking about clotrimazole-betamethasone (LOTRISONE)  ???

## 2020-11-28 ENCOUNTER — Other Ambulatory Visit: Payer: Self-pay

## 2020-11-28 ENCOUNTER — Encounter: Payer: Self-pay | Admitting: Gastroenterology

## 2020-11-28 ENCOUNTER — Ambulatory Visit (INDEPENDENT_AMBULATORY_CARE_PROVIDER_SITE_OTHER): Payer: Medicare Other | Admitting: Gastroenterology

## 2020-11-28 VITALS — BP 150/74 | HR 75 | Ht 73.0 in | Wt 213.2 lb

## 2020-11-28 DIAGNOSIS — R1319 Other dysphagia: Secondary | ICD-10-CM | POA: Diagnosis not present

## 2020-11-28 MED ORDER — PANTOPRAZOLE SODIUM 40 MG PO TBEC: 40.0000 mg | DELAYED_RELEASE_TABLET | Freq: Every day | ORAL | 6 refills | Status: DC

## 2020-11-28 NOTE — Progress Notes (Signed)
Gastroenterology Consultation  Referring Provider:     Carloyn Manner, MD Primary Care Physician:  Lauree Chandler, NP Primary Gastroenterologist:  Dr. Allen Norris     Reason for Consultation:     Dysphagia        HPI:   Ladarrion Telfair is a 75 y.o. y/o male referred for consultation & management of dysphagia by Dr. Dewaine Oats, Carlos American, NP.  This patient comes to me after being seen by ENT for dysphagia.  The patient states that he has a lot of coughing.  He states that it can happen with grainy foods more than other foods.  He denies any hematemesis black stools or bloody stools.  He also reports that he had a colonoscopy 3 years ago.  He has recently moved to this area and noted that he was having a feeling of food getting stuck with resulting coughing.  He denies any unexplained weight loss.  There is also no abdominal pain associated with the symptoms.  The patient had a barium swallow that showed:  IMPRESSION: No aspiration noted.  Barium tablet passed normally. Please refer to the Speech Pathologists report for complete details and recommendations.  He does not report any of his symptoms be associated with greasy or fatty foods.  He has been taking a H2 blocker and takes NSAIDs.   Past Medical History:  Diagnosis Date  . Arthritis    Knees  . COPD (chronic obstructive pulmonary disease) ([place])   . Does use hearing aid    Bilateral  . Enlarged prostate   . Hypercholesterolemia     Past Surgical History:  Procedure Laterality Date  . COLONOSCOPY  2017   Dr. Madolyn Frieze    Prior to Admission medications   Medication Sig Start Date End Date Taking? Authorizing Provider  acetaminophen (TYLENOL) 500 MG tablet Take 500 mg by mouth as needed.    [provider]  albuterol (PROVENTIL) (2.5 MG/3ML) 0.083% nebulizer solution Take 3 mLs (2.5 mg total) by nebulization daily as needed for wheezing or shortness of breath. 02/15/20   Lauree Chandler, NP  albuterol (VENTOLIN HFA) 108 (90  Base) MCG/ACT inhaler Inhale 2 puffs into the lungs every 4 (four) hours as needed for wheezing or shortness of breath. 11/21/19   Lauree Chandler, NP  Apoaequorin (PREVAGEN) 10 MG CAPS Take 1 capsule by mouth daily.     [provider]  Ascorbic Acid (VITAMIN C) 1000 MG tablet Take 1,000 mg by mouth daily.    [provider]  aspirin EC 81 MG tablet Take 81 mg by mouth daily.    [provider]  B Complex-C (SUPER B-COMPLEX + VITAMIN C PO) Take by mouth.    [provider]  Biotin 1000 MCG tablet Take 1,000 mcg by mouth daily.     [provider]  calcium elemental as carbonate (BARIATRIC TUMS ULTRA) 400 MG chewable tablet Chew 1,000 mg by mouth daily as needed.     [provider]  cetaphil (CETAPHIL) cream Apply topically as needed.    [provider]  Cholecalciferol (VITAMIN D) 50 MCG (2000 UT) tablet Take 2,000 Units by mouth daily. 2 daily    [provider]  clotrimazole-betamethasone (LOTRISONE) cream Apply 1 application topically 2 (two) times daily. 10/31/20   Lauree Chandler, NP  [facility] Liver Oil/Vitamins A & D CAPS Take by mouth.    [provider]  cyclobenzaprine (FLEXERIL) 10 MG tablet Take one tablet by mouth twice  daily as needed for muscle spasms. 09/26/20   Lauree Chandler, NP  famotidine (PEPCID) 20 MG tablet Take 20 mg by mouth.    [provider]  finasteride (PROSCAR) 5 MG tablet Take 1 tablet (5 mg total) by mouth daily. 09/24/20   Lauree Chandler, NP  fluticasone (FLONASE) 50 MCG/ACT nasal spray USE 1 SPRAY IN EACH NOSTRIL EVERY DAY 09/24/20   Lauree Chandler, NP  Garlic 5956 MG CAPS Take by mouth.    [provider]  loratadine (CLARITIN) 10 MG tablet Take 1 tablet (10 mg total) by mouth daily. 03/19/20   Lauree Chandler, NP  melatonin 5 MG TABS Take 5 mg by mouth at bedtime.     [provider]  Methylsulfonylmethane (MSM) 1000 MG CAPS Take 1 capsule by  mouth 2 (two) times daily.    [provider]  Misc Natural Products (OSTEO BI-FLEX ADV TRIPLE ST) TABS Take by mouth.    [provider]  Multiple Vitamins-Minerals (ONE DAILY MENS HEALTH) TABS Take by mouth.    [provider]  nabumetone (RELAFEN) 750 MG tablet Take 1 tablet (750 mg total) by mouth 2 (two) times daily as needed. 10/01/20   Lauree Chandler, NP  Omega-3 1000 MG CAPS Take by mouth.    [provider]  Saw Palmetto 450 MG CAPS Take by mouth.    [provider]  selenium 200 MCG TABS tablet Take by mouth daily.    [provider]  simvastatin (ZOCOR) 40 MG tablet Take 1 tablet (40 mg total) by mouth daily. 09/24/20   Lauree Chandler, NP  tadalafil (CIALIS) 20 MG tablet Take 1 tablet (20 mg total) by mouth daily as needed for erectile dysfunction. 11/21/19   Lauree Chandler, NP  tamsulosin (FLOMAX) 0.4 MG CAPS capsule Take 1 capsule (0.4 mg total) by mouth daily. 09/24/20   Lauree Chandler, NP  temazepam (RESTORIL) 15 MG capsule Take 1 capsule (15 mg total) by mouth at bedtime as needed for sleep. 09/26/20   Lauree Chandler, NP  Testosterone 2 MG/24HR PT24 Apply 1 patch daily 10/08/20   Lauree Chandler, NP  Tiotropium Bromide Monohydrate (SPIRIVA RESPIMAT) 2.5 MCG/ACT AERS Inhale 2 puffs into the lungs daily. 09/26/20   Lauree Chandler, NP  vitamin B-12 (CYANOCOBALAMIN) 500 MCG tablet Take 500 mcg by mouth daily.    [provider]  Vitamin E 180 MG CAPS Take by mouth.    [provider]    Family History  Problem Relation Age of Onset  . Alzheimer's disease Mother   . Diabetes Mother   . Heart failure Father   . Heart attack Father   . Diabetes Father   . Dementia Father      Social History   Tobacco Use  . Smoking status: Former Smoker    Packs/day: 1.00    Years: 35.00    Pack years: 35.00    Types: Cigarettes    Quit date: 1980    Years since quitting: 42.4  . Smokeless  tobacco: Never Used  Vaping Use  . Vaping Use: Never used  Substance Use Topics  . Alcohol use: Not Currently  . Drug use: Never    Allergies as of 11/28/2020 - Review Complete 10/10/2020  Allergen Reaction Noted  . Bee pollen Anaphylaxis 05/13/2020  . Bee venom  05/23/2019    Review of Systems:    All systems reviewed and negative except where noted  in HPI.   Physical Exam:  There were no vitals taken for this visit. No LMP for male patient. General:   Alert,  Well-developed, well-nourished, pleasant and cooperative in NAD Head:  Normocephalic and atraumatic. Eyes:  Sclera clear, no icterus.   Conjunctiva pink. Ears:  Normal auditory acuity. Neck:  Supple; no masses or thyromegaly. Lungs:  Respirations even and unlabored.  Clear throughout to auscultation.   No wheezes, crackles, or rhonchi. No acute distress. Heart:  Regular rate and rhythm; no murmurs, clicks, rubs, or gallops. Abdomen:  Normal bowel sounds.  No bruits.  Soft, non-tender and non-distended without masses, hepatosplenomegaly or hernias noted.  No guarding or rebound tenderness.  Negative Carnett sign.   Rectal:  Deferred.  Pulses:  Normal pulses noted. Extremities:  No clubbing or edema.  No cyanosis. Neurologic:  Alert and oriented x3;  grossly normal neurologically. Skin:  Intact without significant lesions or rashes.  No jaundice. Lymph Nodes:  No significant cervical adenopathy. Psych:  Alert and cooperative. Normal mood and affect.  Imaging Studies: No results found.  Assessment and Plan:   Joshua Mercado is a 75 y.o. y/o male who comes in today with a history of dysphagia with a cough after eating and he reports that food feels like it is getting stuck.  There is no unexplained weight loss associated with this.  The patient will be started on Protonix and stop his Pepcid.  The patient will also be set up for an upper endoscopy to rule out any inflammatory processes causing him to have dysphagia.  The  patient has been explained the plan agrees with it.    [name], MD. Marval Regal    Note: This dictation was prepared with Dragon dictation along with smaller phrase technology. Any transcriptional errors that result from this process are unintentional.

## 2020-12-05 ENCOUNTER — Encounter: Payer: Self-pay | Admitting: Gastroenterology

## 2020-12-05 ENCOUNTER — Other Ambulatory Visit: Payer: Self-pay

## 2020-12-05 ENCOUNTER — Ambulatory Visit: Payer: Medicare Other | Admitting: Anesthesiology

## 2020-12-05 ENCOUNTER — Encounter
Admission: RE | Disposition: A | Discharge status: Home / Self Care | Payer: Self-pay | Source: Ambulatory Visit | Attending: Gastroenterology

## 2020-12-05 ENCOUNTER — Ambulatory Visit
Admission: RE | Admit: 2020-12-05 | Discharge: 2020-12-05 | Disposition: A | Discharge status: Home / Self Care | Payer: Medicare Other | Source: Ambulatory Visit | Attending: Gastroenterology | Admitting: Gastroenterology

## 2020-12-05 DIAGNOSIS — Z91030 Bee allergy status: Secondary | ICD-10-CM | POA: Insufficient documentation

## 2020-12-05 DIAGNOSIS — R1310 Dysphagia, unspecified: Secondary | ICD-10-CM | POA: Diagnosis not present

## 2020-12-05 DIAGNOSIS — Z87891 Personal history of nicotine dependence: Secondary | ICD-10-CM | POA: Insufficient documentation

## 2020-12-05 DIAGNOSIS — Z79899 Other long term (current) drug therapy: Secondary | ICD-10-CM | POA: Insufficient documentation

## 2020-12-05 DIAGNOSIS — Z7982 Long term (current) use of aspirin: Secondary | ICD-10-CM | POA: Diagnosis not present

## 2020-12-05 DIAGNOSIS — I10 Essential (primary) hypertension: Secondary | ICD-10-CM | POA: Insufficient documentation

## 2020-12-05 DIAGNOSIS — Z820 Family history of epilepsy and other diseases of the nervous system: Secondary | ICD-10-CM | POA: Insufficient documentation

## 2020-12-05 DIAGNOSIS — J449 Chronic obstructive pulmonary disease, unspecified: Secondary | ICD-10-CM | POA: Diagnosis not present

## 2020-12-05 DIAGNOSIS — Z8249 Family history of ischemic heart disease and other diseases of the circulatory system: Secondary | ICD-10-CM | POA: Insufficient documentation

## 2020-12-05 DIAGNOSIS — Z833 Family history of diabetes mellitus: Secondary | ICD-10-CM | POA: Insufficient documentation

## 2020-12-05 DIAGNOSIS — Z7951 Long term (current) use of inhaled steroids: Secondary | ICD-10-CM | POA: Diagnosis not present

## 2020-12-05 DIAGNOSIS — K2970 Gastritis, unspecified, without bleeding: Secondary | ICD-10-CM | POA: Diagnosis not present

## 2020-12-05 DIAGNOSIS — R1319 Other dysphagia: Secondary | ICD-10-CM | POA: Diagnosis not present

## 2020-12-05 DIAGNOSIS — K222 Esophageal obstruction: Secondary | ICD-10-CM

## 2020-12-05 HISTORY — PX: ESOPHAGOGASTRODUODENOSCOPY (EGD) WITH PROPOFOL: SHX5813

## 2020-12-05 SURGERY — ESOPHAGOGASTRODUODENOSCOPY (EGD) WITH PROPOFOL
Anesthesia: General

## 2020-12-05 MED ORDER — LIDOCAINE HCL (CARDIAC) PF 100 MG/5ML IV SOSY
PREFILLED_SYRINGE | INTRAVENOUS | Status: DC | PRN
  Administered 2020-12-05: 60 mg via INTRAVENOUS

## 2020-12-05 MED ORDER — SODIUM CHLORIDE 0.9 % IV SOLN: INTRAVENOUS | Status: DC

## 2020-12-05 MED ORDER — GLYCOPYRROLATE 0.2 MG/ML IJ SOLN
INTRAMUSCULAR | Status: DC | PRN
  Administered 2020-12-05: .2 mg via INTRAVENOUS

## 2020-12-05 MED ORDER — PROPOFOL 10 MG/ML IV BOLUS
INTRAVENOUS | Status: DC | PRN
  Administered 2020-12-05: 20 mg via INTRAVENOUS
  Administered 2020-12-05: 30 mg via INTRAVENOUS
  Administered 2020-12-05: 70 mg via INTRAVENOUS
  Administered 2020-12-05: 30 mg via INTRAVENOUS
  Administered 2020-12-05 (×2): 20 mg via INTRAVENOUS

## 2020-12-05 NOTE — H&P (Signed)
[name], MD [facility] [address] [name], [place] [address] Phone:[phone] Fax : [phone]  Primary Care Physician:  Lauree Chandler, NP Primary Gastroenterologist:  Dr. Allen Norris  Pre-Procedure History & Physical: HPI:  Joshua Mercado is a 75 y.o. male is here for an endoscopy.   Past Medical History:  Diagnosis Date  . Arthritis    Knees  . COPD (chronic obstructive pulmonary disease) ([place])   . Does use hearing aid    Bilateral  . Enlarged prostate   . Hypercholesterolemia     Past Surgical History:  Procedure Laterality Date  . COLONOSCOPY  2017   Dr. Madolyn Frieze  . UPPER GI ENDOSCOPY      Prior to Admission medications   Medication Sig Start Date End Date Taking? Authorizing Provider  acetaminophen (TYLENOL) 500 MG tablet Take 500 mg by mouth as needed.   Yes [provider]  albuterol (PROVENTIL) (2.5 MG/3ML) 0.083% nebulizer solution Take 3 mLs (2.5 mg total) by nebulization daily as needed for wheezing or shortness of breath. 02/15/20  Yes Eubanks, Carlos American, NP  Apoaequorin (PREVAGEN) 10 MG CAPS Take 1 capsule by mouth daily.    Yes [provider]  Ascorbic Acid (VITAMIN C) 1000 MG tablet Take 1,000 mg by mouth daily.   Yes [provider]  aspirin EC 81 MG tablet Take 81 mg by mouth daily.   Yes [provider]  B Complex-C (SUPER B-COMPLEX + VITAMIN C PO) Take by mouth.   Yes [provider]  Biotin 1000 MCG tablet Take 1,000 mcg by mouth daily.    Yes [provider]  calcium elemental as carbonate (BARIATRIC TUMS ULTRA) 400 MG chewable tablet Chew 1,000 mg by mouth daily as needed.    Yes [provider]  Cholecalciferol (VITAMIN D) 50 MCG (2000 UT) tablet Take 2,000 Units by mouth daily. 2 daily   Yes [provider]  [facility] Liver Oil/Vitamins A & D CAPS Take by mouth.   Yes [provider]  famotidine (PEPCID) 20 MG tablet Take 20 mg by mouth.   Yes [provider]   finasteride (PROSCAR) 5 MG tablet Take 1 tablet (5 mg total) by mouth daily. 09/24/20  Yes Lauree Chandler, NP  fluticasone (FLONASE) 50 MCG/ACT nasal spray USE 1 SPRAY IN EACH NOSTRIL EVERY DAY 09/24/20  Yes Lauree Chandler, NP  Garlic 6712 MG CAPS Take by mouth.   Yes [provider]  loratadine (CLARITIN) 10 MG tablet Take 1 tablet (10 mg total) by mouth daily. 03/19/20  Yes Lauree Chandler, NP  melatonin 5 MG TABS Take 5 mg by mouth at bedtime.    Yes [provider]  Methylsulfonylmethane (MSM) 1000 MG CAPS Take 1 capsule by mouth 2 (two) times daily.   Yes [provider]  Misc Natural Products (OSTEO BI-FLEX ADV TRIPLE ST) TABS Take by mouth.   Yes [provider]  Multiple Vitamins-Minerals (ONE DAILY MENS HEALTH) TABS Take by mouth.   Yes [provider]  nabumetone (RELAFEN) 750 MG tablet Take 1 tablet (750 mg total) by mouth 2 (two) times daily as needed. 10/01/20  Yes Lauree Chandler, NP  Omega-3 1000 MG CAPS Take by mouth.   Yes [provider]  pantoprazole (PROTONIX) 40 MG tablet Take 1 tablet (40 mg total) by mouth daily. 11/28/20  Yes [name], MD  Saw Palmetto 450 MG CAPS Take by mouth.   Yes [provider]  selenium 200 MCG TABS tablet Take by mouth daily.   Yes [provider]  simvastatin (ZOCOR) 40 MG tablet Take 1 tablet (40 mg total) by mouth daily. 09/24/20  Yes Lauree Chandler, NP  tamsulosin (FLOMAX) 0.4 MG CAPS capsule Take 1 capsule (0.4 mg total) by mouth daily. 09/24/20  Yes Lauree Chandler, NP  Testosterone 2 MG/24HR PT24 Apply 1 patch daily 10/08/20  Yes Lauree Chandler, NP  Tiotropium Bromide Monohydrate (SPIRIVA RESPIMAT) 2.5 MCG/ACT AERS Inhale 2 puffs into the lungs daily. 09/26/20  Yes Lauree Chandler, NP  vitamin B-12 (CYANOCOBALAMIN) 500 MCG tablet Take 500 mcg by mouth daily.   Yes [provider]  Vitamin E 180 MG CAPS Take by mouth.   Yes [provider]  albuterol (VENTOLIN HFA) 108 (90 Base) MCG/ACT inhaler Inhale 2 puffs into the lungs every 4 (four) hours as needed for wheezing or shortness of breath. 11/21/19   Lauree Chandler, NP  cetaphil (CETAPHIL) cream Apply topically as needed.    [provider]  clotrimazole-betamethasone (LOTRISONE) cream Apply 1 application topically 2 (two) times daily. 10/31/20   Lauree Chandler, NP  cyclobenzaprine (FLEXERIL) 10 MG tablet Take one tablet by mouth twice daily as needed for muscle spasms. 09/26/20   Lauree Chandler, NP  tadalafil (CIALIS) 20 MG tablet Take 1 tablet (20 mg total) by mouth daily as needed for erectile dysfunction. 11/21/19   Lauree Chandler, NP  temazepam (RESTORIL) 15 MG capsule Take 1 capsule (15 mg total) by mouth at bedtime as needed for sleep. 09/26/20   Lauree Chandler, NP    Allergies as of 11/28/2020 - Review Complete 11/28/2020  Allergen Reaction Noted  . Bee pollen Anaphylaxis 05/13/2020  . Bee venom  05/23/2019    Family History  Problem Relation Age of Onset  . Alzheimer's disease Mother   . Diabetes Mother   . Heart failure Father   . Heart attack Father   . Diabetes Father   . Dementia Father     Social History   Socioeconomic History  . Marital status: Married    Spouse name: Not on file  . Number of children: Not on file  . Years of education: Not on file  . Highest education level: Not on file  Occupational History  . Not on file  Tobacco Use  . Smoking status: Former Smoker    Packs/day: 1.00    Years: 35.00    Pack years: 35.00    Types: Cigarettes    Quit date: 1980    Years since quitting: 42.4  . Smokeless tobacco: Never Used  Vaping Use  . Vaping Use: Never used  Substance and Sexual Activity  . Alcohol use: Not Currently  . Drug use: Never  . Sexual activity: Not on file  Other Topics Concern  . Not on file  Social History Narrative   No pets.  Clinical biochemist.  Trade school education.  Wife is a  retired Engineer, drilling.    Social Determinants of Health   Financial Resource Strain: Not on file  Food Insecurity: Not on file  Transportation Needs: Not on file  Physical Activity: Not on file  Stress: Not on file  Social Connections: Not on file  Intimate Partner Violence: Not on file    Review of Systems: See HPI, otherwise negative ROS  Physical Exam: There were no vitals taken for this visit. General:   Alert,  pleasant and cooperative in NAD Head:  Normocephalic and atraumatic. Neck:  Supple; no masses or thyromegaly. Lungs:  Clear throughout to auscultation.    Heart:  Regular rate and rhythm. Abdomen:  Soft, nontender and nondistended. Normal bowel sounds, without guarding, and without rebound.   Neurologic:  Alert and  oriented x4;  grossly normal neurologically.  Impression/Plan: Joshua Mercado is here for an endoscopy to be performed for dysphagia  Risks, benefits, limitations, and alternatives regarding  endoscopy have been reviewed with the patient.  Questions have been answered.  All parties agreeable.   [name], MD  12/05/2020, 7:26 AM

## 2020-12-05 NOTE — Anesthesia Preprocedure Evaluation (Addendum)
Anesthesia Evaluation  Patient identified by MRN, date of birth, ID band Patient awake    Reviewed: Allergy & Precautions, NPO status , Patient's Chart, lab work & pertinent test results  History of Anesthesia Complications Negative for: history of anesthetic complications  Airway Mallampati: III  TM Distance: >3 FB Neck ROM: full    Dental  (+) Missing   Pulmonary sleep apnea , COPD, former smoker,    Pulmonary exam normal        Cardiovascular Exercise Tolerance: Good hypertension, (-) anginaNormal cardiovascular exam+ dysrhythmias      Neuro/Psych negative neurological ROS  negative psych ROS   GI/Hepatic negative GI ROS, Neg liver ROS,   Endo/Other  negative endocrine ROS  Renal/GU negative Renal ROS  negative genitourinary   Musculoskeletal  (+) Arthritis ,   Abdominal   Peds  Hematology negative hematology ROS (+)   Anesthesia Other Findings Past Medical History: No date: Arthritis     Comment:  Knees No date: COPD (chronic obstructive pulmonary disease) ([facility]) No date: Does use hearing aid     Comment:  Bilateral No date: Enlarged prostate No date: Hypercholesterolemia  Past Surgical History: 2017: COLONOSCOPY     Comment:  Dr. Madolyn Frieze No date: UPPER GI ENDOSCOPY  BMI    Body Mass Index: 28.75 kg/m      Reproductive/Obstetrics negative OB ROS                            Anesthesia Physical Anesthesia Plan  ASA: III  Anesthesia Plan: General   Post-op Pain Management:    Induction: Intravenous  PONV Risk Score and Plan: Propofol infusion and TIVA  Airway Management Planned: Natural Airway and Nasal Cannula  Additional Equipment:   Intra-op Plan:   Post-operative Plan:   Informed Consent: I have reviewed the patients History and Physical, chart, labs and discussed the procedure including the risks, benefits and alternatives for the proposed anesthesia with  the patient or authorized representative who has indicated his/her understanding and acceptance.     Dental Advisory Given  Plan Discussed with: Anesthesiologist, CRNA and Surgeon  Anesthesia Plan Comments: (Patient consented for risks of anesthesia including but not limited to:  - adverse reactions to medications - risk of airway placement if required - damage to eyes, teeth, lips or other oral mucosa - nerve damage due to positioning  - sore throat or hoarseness - Damage to heart, brain, nerves, lungs, other parts of body or loss of life  Patient voiced understanding.)        Anesthesia Quick Evaluation

## 2020-12-05 NOTE — Anesthesia Postprocedure Evaluation (Signed)
Anesthesia Post Note  Patient: Baraka Klatt  Procedure(s) Performed: ESOPHAGOGASTRODUODENOSCOPY (EGD) WITH PROPOFOL (N/A )  Patient location during evaluation: Endoscopy Anesthesia Type: General Level of consciousness: awake and alert Pain management: pain level controlled Vital Signs Assessment: post-procedure vital signs reviewed and stable Respiratory status: spontaneous breathing, nonlabored ventilation, respiratory function stable and patient connected to nasal cannula oxygen Cardiovascular status: blood pressure returned to baseline and stable Postop Assessment: no apparent nausea or vomiting Anesthetic complications: no   No complications documented.   Last Vitals:  Vitals:   12/05/20 0736 12/05/20 0821  BP: (!) 151/73 (!) 145/85  Pulse: 87 74  Resp: 20 19  Temp: (!) 36.3 C (!) 36.4 C  SpO2: 95% 93%    Last Pain:  Vitals:   12/05/20 0841  TempSrc:   PainSc: 0-No pain                 Precious Haws Taejah Ohalloran

## 2020-12-05 NOTE — Transfer of Care (Signed)
Immediate Anesthesia Transfer of Care Note  Patient: Coltin Casher  Procedure(s) Performed: ESOPHAGOGASTRODUODENOSCOPY (EGD) WITH PROPOFOL (N/A )  Patient Location: PACU  Anesthesia Type:General  Level of Consciousness: sedated  Airway & Oxygen Therapy: Patient Spontanous Breathing  Post-op Assessment: Report given to RN and Post -op Vital signs reviewed and stable  Post vital signs: Reviewed and stable  Last Vitals:  Vitals Value Taken Time  BP 145/85 12/05/20 0822  Temp    Pulse 70 12/05/20 0822  Resp 22 12/05/20 0822  SpO2 92 % 12/05/20 0822  Vitals shown include unvalidated device data.  Last Pain:  Vitals:   12/05/20 0736  TempSrc: Temporal  PainSc: 0-No pain         Complications: No complications documented.

## 2020-12-05 NOTE — Op Note (Signed)
[facility] Gastroenterology Patient Name: Joshua Mercado Procedure Date: 12/05/2020 7:58 AM MRN: [id number] Account #: [id number] Date of Birth: 03-12-1946 Admit Type: Outpatient Age: 75 Room: [facility] ENDO ROOM 4 Gender: Male Note Status: Finalized Procedure:             Upper GI endoscopy Indications:           Dysphagia Providers:             [name] MD, MD Referring MD:          Carlos American. Dewaine Oats (Referring MD) Medicines:             Propofol per Anesthesia Complications:         No immediate complications. Procedure:             Pre-Anesthesia Assessment:                        - Prior to the procedure, a History and Physical was                         performed, and patient medications and allergies were                         reviewed. The patient's tolerance of previous                         anesthesia was also reviewed. The risks and benefits                         of the procedure and the sedation options and risks                         were discussed with the patient. All questions were                         answered, and informed consent was obtained. Prior                         Anticoagulants: The patient has taken no previous                         anticoagulant or antiplatelet agents. ASA Grade                         Assessment: II - A patient with mild systemic disease.                         After reviewing the risks and benefits, the patient                         was deemed in satisfactory condition to undergo the                         procedure.                        After obtaining informed consent, the endoscope was  passed under direct vision. Throughout the procedure,                         the patient's blood pressure, pulse, and oxygen                         saturations were monitored continuously. The Endoscope                         was introduced through the mouth, and advanced to the                          second part of duodenum. The upper GI endoscopy was                         accomplished without difficulty. The patient tolerated                         the procedure well. Findings:      Two benign-appearing, intrinsic mild stenoses were found at the       gastroesophageal junction and upper esophageal sphincter. The stenoses       were traversed. A TTS dilator was passed through the scope. Dilation       with a 15-16.5-18 mm balloon dilator was performed to 18 mm. The       dilation site was examined following endoscope reinsertion and showed       complete resolution of luminal narrowing.      Localized mild inflammation characterized by erythema was found in the       gastric antrum. Biopsies were taken with a cold forceps for histology.      The examined duodenum was normal.      Two biopsies were obtained with cold forceps for histology in the middle       third of the esophagus. Impression:            - Benign-appearing esophageal stenoses. Dilated.                        - Gastritis. Biopsied.                        - Normal examined duodenum.                        - Biopsy performed in the middle third of the                         esophagus. Recommendation:        - Discharge patient to home.                        - Resume previous diet.                        - Continue present medications.                        - Await pathology results. Procedure Code(s):     --- Professional ---  [phone], Esophagogastroduodenoscopy, flexible,                         transoral; with transendoscopic balloon dilation of                         esophagus (less than 30 mm diameter)                        43239, 59, Esophagogastroduodenoscopy, flexible,                         transoral; with biopsy, single or multiple Diagnosis Code(s):     --- Professional ---                        R13.10, Dysphagia, unspecified                        K29.70,  Gastritis, unspecified, without bleeding                        K22.2, Esophageal obstruction CPT copyright 2019 [employer]. All rights reserved. The codes documented in this report are preliminary and upon coder review may  be revised to meet current compliance requirements. [name] MD, MD 12/05/2020 8:19:34 AM This report has been signed electronically. Number of Addenda: 0 Note Initiated On: 12/05/2020 7:58 AM Estimated Blood Loss:  Estimated blood loss: none.      [facility]

## 2020-12-06 ENCOUNTER — Encounter: Payer: Self-pay | Admitting: Gastroenterology

## 2020-12-06 LAB — SURGICAL PATHOLOGY

## 2020-12-07 ENCOUNTER — Encounter: Payer: Self-pay | Admitting: Gastroenterology

## 2020-12-17 ENCOUNTER — Ambulatory Visit: Payer: Medicare Other | Admitting: Nurse Practitioner

## 2020-12-17 ENCOUNTER — Other Ambulatory Visit: Payer: Self-pay

## 2020-12-18 ENCOUNTER — Other Ambulatory Visit
Admission: RE | Admit: 2020-12-18 | Discharge: 2020-12-18 | Disposition: A | Discharge status: Home / Self Care | Payer: Medicare Other | Source: Ambulatory Visit | Attending: Otolaryngology | Admitting: Otolaryngology

## 2020-12-18 ENCOUNTER — Other Ambulatory Visit: Payer: Self-pay

## 2020-12-18 HISTORY — DX: Gastro-esophageal reflux disease without esophagitis: K21.9

## 2020-12-18 NOTE — Patient Instructions (Addendum)
Your procedure is scheduled on: Tuesday December 24, 2020. Report to Day Surgery inside [facility] 2nd floor (stop by admissions desk first before getting on elevator). To find out your arrival time please call [phone] between 1PM - 3PM on Monday December 23, 2020.  Remember: Instructions that are not followed completely may result in serious medical risk,  up to and including death, or upon the discretion of your surgeon and anesthesiologist your  surgery may need to be rescheduled.     _X__ 1. Do not eat food after midnight the night before your procedure.                 No chewing gum or hard candies. You may drink clear liquids up to 2 hours                 before you are scheduled to arrive for your surgery- DO not drink clear                 liquids within 2 hours of the start of your surgery.                 Clear Liquids include:  water, apple juice without pulp, clear Gatorade, G2 or                  Gatorade Zero (avoid Red/Purple/Blue), Black Coffee or Tea (Do not add                 anything to coffee or tea).  __X__2.  On the morning of surgery brush your teeth with toothpaste and water, you                may rinse your mouth with mouthwash if you wish.  Do not swallow any toothpaste of mouthwash.     _X__ 3.  No Alcohol for 24 hours before or after surgery.   _X__ 4.  Do Not Smoke or use e-cigarettes For 24 Hours Prior to Your Surgery.                 Do not use any chewable tobacco products for at least 6 hours prior to                 Surgery.  _X__  5.  Do not use any recreational drugs (marijuana, cocaine, heroin, ecstasy, MDMA or other)                For at least one week prior to your surgery.  Combination of these drugs with anesthesia                May have life threatening results.  _X___ 6.  Notify your doctor if there is any change in your medical condition      (cold, fever, infections).     Do not wear jewelry, make-up, hairpins,  clips or nail polish. Do not wear lotions, powders, or perfumes. You may wear deodorant. Do not shave 48 hours prior to surgery. Men may shave face and neck. Do not bring valuables to the hospital.    [facility] is not responsible for any belongings or valuables.  Contacts, dentures or bridgework may not be worn into surgery. Leave your suitcase in the car. After surgery it may be brought to your room. For patients admitted to the hospital, discharge time is determined by your treatment team.   Patients discharged the day of surgery will not be allowed to drive  home.   Make arrangements for someone to be with you for the first 24 hours of your Same Day Discharge.   __X__ Take these medicines the morning of surgery with A SIP OF WATER:    1. famotidine (PEPCID) 20 MG   2.   3.   4.  5.  6.  ____ Fleet Enema (as directed)   __X__ Use antibacterial Soap as directed  ____ Use Benzoyl Peroxide Gel as instructed  __X__ Use inhalers on the day of surgery  Tiotropium Bromide Monohydrate (SPIRIVA RESPIMAT) 2.5 MCG/ACT AERS  albuterol (PROVENTIL) (2.5 MG/3ML) 0.083% nebulizer solution  ____ Stop metformin 2 days prior to surgery    ____ Take 1/2 of usual insulin dose the night before surgery. No insulin the morning          of surgery.   ____ Call your PCP, cardiologist, or Pulmonologist if taking Coumadin/Plavix/aspirin and ask when to stop before your surgery.   __X__ One Week prior to surgery- Stop Anti-inflammatories such as Ibuprofen, Aleve, Advil, Motrin, meloxicam (MOBIC), diclofenac, etodolac, ketorolac, Toradol, Daypro, piroxicam, Goody's or BC powders. OK TO USE TYLENOL IF NEEDED   __X__ Stop ALL supplements until after surgery. Apoaequorin (PREVAGEN), Ascorbic Acid (VITAMIN C), B Complex-C, Biotin, calcium elemental as carbonate, VITAMIN D), Cod Liver Oil/Vitamins A & D, Garlic, melatonin, Methylsulfonylmethane (MSM), Omega-3, Multiple Vitamins-, [place], selenium,  Vitamin E  And vitamin B-12  ____ Bring C-Pap to the hospital.    If you have any questions regarding your pre-procedure instructions,  Please call Pre-admit Testing at [phone]

## 2020-12-19 ENCOUNTER — Other Ambulatory Visit: Payer: Self-pay | Admitting: Nurse Practitioner

## 2020-12-19 DIAGNOSIS — M170 Bilateral primary osteoarthritis of knee: Secondary | ICD-10-CM

## 2020-12-19 DIAGNOSIS — N401 Enlarged prostate with lower urinary tract symptoms: Secondary | ICD-10-CM

## 2020-12-19 DIAGNOSIS — R350 Frequency of micturition: Secondary | ICD-10-CM

## 2020-12-19 DIAGNOSIS — E782 Mixed hyperlipidemia: Secondary | ICD-10-CM

## 2020-12-24 ENCOUNTER — Other Ambulatory Visit: Payer: Self-pay

## 2020-12-24 ENCOUNTER — Ambulatory Visit: Payer: Medicare Other | Admitting: Urgent Care

## 2020-12-24 ENCOUNTER — Encounter
Admission: RE | Disposition: A | Discharge status: Home / Self Care | Payer: Self-pay | Source: Home / Self Care | Attending: Otolaryngology

## 2020-12-24 ENCOUNTER — Encounter: Payer: Self-pay | Admitting: Otolaryngology

## 2020-12-24 ENCOUNTER — Ambulatory Visit
Admission: RE | Admit: 2020-12-24 | Discharge: 2020-12-24 | Disposition: A | Discharge status: Home / Self Care | Payer: Medicare Other | Attending: Otolaryngology | Admitting: Otolaryngology

## 2020-12-24 DIAGNOSIS — J343 Hypertrophy of nasal turbinates: Secondary | ICD-10-CM | POA: Diagnosis not present

## 2020-12-24 DIAGNOSIS — J342 Deviated nasal septum: Secondary | ICD-10-CM | POA: Insufficient documentation

## 2020-12-24 HISTORY — PX: NASAL SEPTOPLASTY W/ TURBINOPLASTY: SHX2070

## 2020-12-24 SURGERY — SEPTOPLASTY, NOSE, WITH NASAL TURBINATE REDUCTION
Anesthesia: General | Laterality: Bilateral

## 2020-12-24 MED ORDER — LIDOCAINE HCL (CARDIAC) PF 100 MG/5ML IV SOSY
PREFILLED_SYRINGE | INTRAVENOUS | Status: DC | PRN
  Administered 2020-12-24: 100 mg via INTRAVENOUS

## 2020-12-24 MED ORDER — CHLORHEXIDINE GLUCONATE 0.12 % MT SOLN
OROMUCOSAL | Status: AC
  Administered 2020-12-24: 15 mL via OROMUCOSAL
  Filled 2020-12-24: qty 15

## 2020-12-24 MED ORDER — ONDANSETRON HCL 4 MG/2ML IJ SOLN
4.0000 mg | Freq: Once | INTRAMUSCULAR | Status: DC | PRN
Start: 2020-12-24 — End: 2020-12-24

## 2020-12-24 MED ORDER — DEXAMETHASONE SODIUM PHOSPHATE 10 MG/ML IJ SOLN
INTRAMUSCULAR | Status: AC
  Filled 2020-12-24: qty 1

## 2020-12-24 MED ORDER — MIDAZOLAM HCL 2 MG/2ML IJ SOLN
INTRAMUSCULAR | Status: AC
  Filled 2020-12-24: qty 2

## 2020-12-24 MED ORDER — SUCCINYLCHOLINE CHLORIDE 200 MG/10ML IV SOSY
PREFILLED_SYRINGE | INTRAVENOUS | Status: AC
  Filled 2020-12-24: qty 10

## 2020-12-24 MED ORDER — DOCUSATE SODIUM 100 MG PO CAPS: 100.0000 mg | ORAL_CAPSULE | Freq: Two times a day (BID) | ORAL | 0 refills | Status: DC

## 2020-12-24 MED ORDER — LACTATED RINGERS IV SOLN: INTRAVENOUS | Status: DC

## 2020-12-24 MED ORDER — ONDANSETRON HCL 4 MG PO TABS: 4.0000 mg | ORAL_TABLET | Freq: Three times a day (TID) | ORAL | 0 refills | Status: DC | PRN

## 2020-12-24 MED ORDER — SUGAMMADEX SODIUM 200 MG/2ML IV SOLN
INTRAVENOUS | Status: DC | PRN
  Administered 2020-12-24: 200 mg via INTRAVENOUS

## 2020-12-24 MED ORDER — MIDAZOLAM HCL 2 MG/2ML IJ SOLN
INTRAMUSCULAR | Status: DC | PRN
  Administered 2020-12-24: 1 mg via INTRAVENOUS

## 2020-12-24 MED ORDER — SUGAMMADEX SODIUM 500 MG/5ML IV SOLN
INTRAVENOUS | Status: AC
  Filled 2020-12-24: qty 5

## 2020-12-24 MED ORDER — ONDANSETRON HCL 4 MG/2ML IJ SOLN
INTRAMUSCULAR | Status: DC | PRN
  Administered 2020-12-24: 4 mg via INTRAVENOUS

## 2020-12-24 MED ORDER — EPHEDRINE SULFATE 50 MG/ML IJ SOLN
INTRAMUSCULAR | Status: DC | PRN
  Administered 2020-12-24 (×7): 10 mg via INTRAVENOUS

## 2020-12-24 MED ORDER — ORAL CARE MOUTH RINSE: 15.0000 mL | Freq: Once | OROMUCOSAL | Status: AC

## 2020-12-24 MED ORDER — OXYMETAZOLINE HCL 0.05 % NA SOLN
NASAL | Status: AC
  Filled 2020-12-24: qty 30

## 2020-12-24 MED ORDER — PROPOFOL 10 MG/ML IV BOLUS
INTRAVENOUS | Status: AC
  Filled 2020-12-24: qty 20

## 2020-12-24 MED ORDER — LIDOCAINE HCL (PF) 2 % IJ SOLN
INTRAMUSCULAR | Status: AC
  Filled 2020-12-24: qty 5

## 2020-12-24 MED ORDER — FENTANYL CITRATE (PF) 100 MCG/2ML IJ SOLN
INTRAMUSCULAR | Status: DC | PRN
  Administered 2020-12-24: 100 ug via INTRAVENOUS

## 2020-12-24 MED ORDER — PROPOFOL 10 MG/ML IV BOLUS
INTRAVENOUS | Status: DC | PRN
  Administered 2020-12-24: 200 mg via INTRAVENOUS

## 2020-12-24 MED ORDER — PHENYLEPHRINE HCL (PRESSORS) 10 MG/ML IV SOLN
INTRAVENOUS | Status: DC | PRN
  Administered 2020-12-24 (×4): 100 ug via INTRAVENOUS
  Administered 2020-12-24: 200 ug via INTRAVENOUS

## 2020-12-24 MED ORDER — OXYCODONE-ACETAMINOPHEN 5-325 MG PO TABS: 1.0000 | ORAL_TABLET | Freq: Four times a day (QID) | ORAL | 0 refills | Status: AC | PRN

## 2020-12-24 MED ORDER — BACITRACIN 500 UNIT/GM EX OINT
TOPICAL_OINTMENT | CUTANEOUS | Status: DC | PRN
  Administered 2020-12-24: 1 via TOPICAL

## 2020-12-24 MED ORDER — ACETAMINOPHEN 10 MG/ML IV SOLN
INTRAVENOUS | Status: DC | PRN
  Administered 2020-12-24: 1000 mg via INTRAVENOUS

## 2020-12-24 MED ORDER — ONDANSETRON HCL 4 MG/2ML IJ SOLN
INTRAMUSCULAR | Status: AC
  Filled 2020-12-24: qty 2

## 2020-12-24 MED ORDER — ACETAMINOPHEN 10 MG/ML IV SOLN
INTRAVENOUS | Status: AC
  Filled 2020-12-24: qty 100

## 2020-12-24 MED ORDER — FENTANYL CITRATE (PF) 100 MCG/2ML IJ SOLN
INTRAMUSCULAR | Status: AC
  Filled 2020-12-24: qty 2

## 2020-12-24 MED ORDER — LIDOCAINE-EPINEPHRINE 1 %-1:100000 IJ SOLN
INTRAMUSCULAR | Status: AC
  Filled 2020-12-24: qty 1

## 2020-12-24 MED ORDER — FAMOTIDINE 20 MG PO TABS
ORAL_TABLET | ORAL | Status: AC
  Administered 2020-12-24: 20 mg
  Filled 2020-12-24: qty 1

## 2020-12-24 MED ORDER — OXYMETAZOLINE HCL 0.05 % NA SOLN
NASAL | Status: DC | PRN
  Administered 2020-12-24: 1 via TOPICAL

## 2020-12-24 MED ORDER — CHLORHEXIDINE GLUCONATE 0.12 % MT SOLN: 15.0000 mL | Freq: Once | OROMUCOSAL | Status: AC

## 2020-12-24 MED ORDER — DEXAMETHASONE SODIUM PHOSPHATE 10 MG/ML IJ SOLN
INTRAMUSCULAR | Status: DC | PRN
  Administered 2020-12-24: 10 mg via INTRAVENOUS

## 2020-12-24 MED ORDER — ROCURONIUM BROMIDE 10 MG/ML (PF) SYRINGE
PREFILLED_SYRINGE | INTRAVENOUS | Status: AC
  Filled 2020-12-24: qty 10

## 2020-12-24 MED ORDER — AMOXICILLIN-POT CLAVULANATE 875-125 MG PO TABS: 1.0000 | ORAL_TABLET | Freq: Two times a day (BID) | ORAL | 0 refills | Status: AC

## 2020-12-24 MED ORDER — LIDOCAINE-EPINEPHRINE 1 %-1:100000 IJ SOLN
INTRAMUSCULAR | Status: DC | PRN
  Administered 2020-12-24: 5.5 mL

## 2020-12-24 MED ORDER — MEPERIDINE HCL 25 MG/ML IJ SOLN: 6.2500 mg | INTRAMUSCULAR | Status: DC | PRN

## 2020-12-24 MED ORDER — ROCURONIUM BROMIDE 100 MG/10ML IV SOLN
INTRAVENOUS | Status: DC | PRN
  Administered 2020-12-24: 50 mg via INTRAVENOUS

## 2020-12-24 MED ORDER — FENTANYL CITRATE (PF) 100 MCG/2ML IJ SOLN: 25.0000 ug | INTRAMUSCULAR | Status: DC | PRN

## 2020-12-24 MED ORDER — FAMOTIDINE 20 MG PO TABS: 20.0000 mg | ORAL_TABLET | Freq: Once | ORAL | Status: AC

## 2020-12-24 SURGICAL SUPPLY — 29 items
BLADE SURG 15 STRL LF DISP TIS (BLADE) ×1 IMPLANT
BLADE SURG 15 STRL SS (BLADE) ×2
BNDG EYE OVAL (GAUZE/BANDAGES/DRESSINGS) ×2 IMPLANT
CANISTER SUCT 1200ML W/VALVE (MISCELLANEOUS) ×2 IMPLANT
CNTNR SPEC 2.5X3XGRAD LEK (MISCELLANEOUS)
COAG SUCT 10F 3.5MM HAND CTRL (MISCELLANEOUS) ×2 IMPLANT
CONT SPEC 4OZ STER OR WHT (MISCELLANEOUS)
CONT SPEC 4OZ STRL OR WHT (MISCELLANEOUS)
CONTAINER SPEC 2.5X3XGRAD LEK (MISCELLANEOUS) IMPLANT
COVER WAND RF STERILE (DRAPES) ×2 IMPLANT
DEFOGGER ANTIFOG KIT (MISCELLANEOUS) ×2 IMPLANT
DRESSING NASL FOAM PST OP SINU (MISCELLANEOUS) IMPLANT
DRSG NASAL FOAM POST OP SINU (MISCELLANEOUS) ×2
GAUZE PACK 2X3YD (PACKING) ×2 IMPLANT
GLOVE SURG ENC MOIS LTX SZ7.5 (GLOVE) ×4 IMPLANT
GOWN STRL REUS W/ TWL LRG LVL3 (GOWN DISPOSABLE) ×2 IMPLANT
GOWN STRL REUS W/TWL LRG LVL3 (GOWN DISPOSABLE) ×4
LABEL OR SOLS (LABEL) ×2 IMPLANT
MANIFOLD NEPTUNE II (INSTRUMENTS) ×2 IMPLANT
NS IRRIG 500ML POUR BTL (IV SOLUTION) ×1 IMPLANT
PACK HEAD/NECK (MISCELLANEOUS) ×2 IMPLANT
PATTIES SURGICAL .5 X3 (DISPOSABLE) ×2 IMPLANT
SPLINT NASAL REUTER .5MM (MISCELLANEOUS) ×1 IMPLANT
SPLINT NASAL SEPTAL PRE-CUT (MISCELLANEOUS) IMPLANT
SUT CHROMIC 4 0 RB 1X27 (SUTURE) ×2 IMPLANT
SUT ETH BLK MONO 3 0 FS 1 12/B (SUTURE) ×2 IMPLANT
SYR 30ML LL (SYRINGE) ×2 IMPLANT
SYR 3ML LL SCALE MARK (SYRINGE) ×2 IMPLANT
WATER STERILE IRR 1000ML POUR (IV SOLUTION) ×2 IMPLANT

## 2020-12-24 NOTE — Op Note (Signed)
..12/24/2020  10:45 AM    Joshua Mercado  [id number]    Pre-Op Dx:  Deviated Nasal Septum, Hypertrophic Inferior Turbinates  Post-op Dx: Same  Proc: Nasal Septoplasty, Bilateral Partial Reduction Inferior Turbinates   Surg:  Joshua Mercado  Anes:  GOT  EBL:  46ml  Comp:  none  Findings: large left sided anterior and mid septal deviation of bone and cartilage.  Bilateral enlarged inferior turbinates of bone and soft tissue.  Procedure: With the patient in a comfortable supine position,  general orotracheal anesthesia was induced without difficulty.  The patient received preoperative Afrin spray for topical decongestion and vasoconstriction.  At an appropriate level, the patient was placed in a semi-sitting position.  Nasal vibrissae were trimmed.   1% Xylocaine with 1:100,000 epinephrine, 5.5cc's, was infiltrated into the anterior floor of the nose, into the nasal spine region, into the membranous columella, and finally into the submucoperichondrial plane of the septum on both sides.  Several minutes were allowed for this to take effect.  Cottoniod pledgetts soaked in Afrin were placed into both nasal cavities and left while the patient was prepped and draped in the standard fashion.   A proper time-out was performed.  The materials were removed from the nose and observed to be intact and correct in number.  The nose was inspected with a headlight and zero degree endoscope with the findings as described above.  A left Killian incision was sharply executed and carried down to the caudal edge of the quadrangular cartilage with a 15 blade scapel.  A mucoperichondrial flap was elelvated along the quadrangular plate back to the bony-cartilaginous junction using caudal elevator and freer elevator. The mucoperiostium was then elevated along the ethmoid plate and the vomer. An itracartilagenous incision was made using the freer elevator and a contralateral mucoperichondiral flap was elevated  using a freer elevator.  Care was taken to avoid any large rents or opposing rents in the mucoperichondrial flap.  Boney spurs of the vomer and maxillary crest were removed with Takahashi forceps.  The area of cartilagenous deviation was removed with combination of freer elevator and Takahashi forceps creating a widely patent nasal cavity as well as resolution of obstruction from the cartilagenous deviation. The mucosal flaps were placed back into their anatomic position to allow visualization of the airways. The septum now sat in the midline with an improved airway.  A 4-0 Chromic was used to close the [place] incision as well.   The inferior turbinates were then inspected.  Under endoscopic visualization, the inferior turbinates were infractured bilaterally with a Soil scientist.  A kelly clamp was attached to the anterior-inferior third of each inferior turbinate for approximately one minute.  Under endoscopic visualization, Tru-cutting forceps were used to remove the anterior-inferior third of each inferior turbinate.  Electrocautery was used to control bleeding in the area. The remaining turbinate was then outfractured to open up the airway further. There was no significant bleeding noted. The right turbinate was then trimmed and outfractured in a similar fashion.  The airways were then visualized and showed open passageways on both sides that were significantly improved compared to before surgery.       There was no signifcant bleeding. Nasal splints were applied to both sides of the septum using Xomed 0.60mm regular sized splints that were trimmed, and then held in position with a 3-0 Nylon through and through suture.  Stamberger sinufoam was placed along the cut edge of the inferior turbinates bilaterally.  The patient was  turned back over to anesthesia, and awakened, extubated, and taken to the PACU in satisfactory condition.  Dispo:   PACU to home  Plan: Ice, elevation, narcotic analgesia,  steroid taper, and prophylactic antibiotics for the duration of indwelling nasal foreign bodies.  We will reevaluate the patient in the office in 6 days and remove the septal splints.  Return to work in 10 days, strenuous activities in two weeks.   Joshua Mercado 12/24/2020 10:45 AM

## 2020-12-24 NOTE — Transfer of Care (Signed)
Immediate Anesthesia Transfer of Care Note  Patient: Joshua Mercado  Procedure(s) Performed: NASAL SEPTOPLASTY WITH INFERIOR TURBINATE REDUCTION (Bilateral)  Patient Location: PACU  Anesthesia Type:General  Level of Consciousness: drowsy  Airway & Oxygen Therapy: Patient Spontanous Breathing and Patient connected to face mask oxygen  Post-op Assessment: Report given to RN and Post -op Vital signs reviewed and stable  Post vital signs: Reviewed and stable  Last Vitals:  Vitals Value Taken Time  BP 180/76 12/24/20 1053  Temp    Pulse 61 12/24/20 1058  Resp 16 12/24/20 1058  SpO2 96 % 12/24/20 1058  Vitals shown include unvalidated device data.  Last Pain:  Vitals:   12/24/20 0813  TempSrc: Oral  PainSc: 0-No pain         Complications: No notable events documented.

## 2020-12-24 NOTE — Anesthesia Postprocedure Evaluation (Signed)
Anesthesia Post Note  Patient: Joshua Mercado  Procedure(s) Performed: NASAL SEPTOPLASTY WITH INFERIOR TURBINATE REDUCTION (Bilateral)  Patient location during evaluation: PACU Anesthesia Type: General Level of consciousness: awake and alert, awake and oriented Pain management: pain level controlled Vital Signs Assessment: post-procedure vital signs reviewed and stable Respiratory status: spontaneous breathing, nonlabored ventilation and respiratory function stable Cardiovascular status: blood pressure returned to baseline and stable Postop Assessment: no apparent nausea or vomiting Anesthetic complications: no   No notable events documented.   Last Vitals:  Vitals:   12/24/20 1130 12/24/20 1143  BP:  (!) 142/87  Pulse: 95 (!) 55  Resp: 17 16  Temp: (!) 36.2 C (!) 36.2 C  SpO2: 93% 93%    Last Pain:  Vitals:   12/24/20 1143  TempSrc: Temporal  PainSc: 2                  Phill Mutter

## 2020-12-24 NOTE — Anesthesia Procedure Notes (Signed)
Procedure Name: Intubation Date/Time: 12/24/2020 9:47 AM Performed by: Phill Mutter, MD Pre-anesthesia Checklist: Patient identified, Emergency Drugs available, Suction available and Patient being monitored Patient Re-evaluated:Patient Re-evaluated prior to induction Oxygen Delivery Method: Circle system utilized Preoxygenation: Pre-oxygenation with 100% oxygen Induction Type: IV induction Ventilation: Mask ventilation without difficulty Laryngoscope Size: McGraph and 4 Grade View: Grade I Tube type: Oral Tube size: 7.5 mm Number of attempts: 1 Airway Equipment and Method: Stylet and Oral airway Placement Confirmation: ETT inserted through vocal cords under direct vision, positive ETCO2 and breath sounds checked- equal and bilateral Tube secured with: Tape Dental Injury: Teeth and Oropharynx as per pre-operative assessment

## 2020-12-24 NOTE — H&P (Signed)
..  History and Physical paper copy reviewed and updated date of procedure and will be scanned into system.  Patient seen and examined.  Bloodwork reviewed.

## 2020-12-24 NOTE — Anesthesia Preprocedure Evaluation (Signed)
Anesthesia Evaluation  Patient identified by MRN, date of birth, ID band Patient awake    Reviewed: Allergy & Precautions, NPO status , Patient's Chart, lab work & pertinent test results  History of Anesthesia Complications Negative for: history of anesthetic complications  Airway Mallampati: III  TM Distance: >3 FB Neck ROM: full    Dental  (+) Missing   Pulmonary sleep apnea , COPD, former smoker,    Pulmonary exam normal        Cardiovascular Exercise Tolerance: Good hypertension, (-) anginaNormal cardiovascular exam+ dysrhythmias      Neuro/Psych negative neurological ROS  negative psych ROS   GI/Hepatic Neg liver ROS, GERD  ,  Endo/Other  negative endocrine ROS  Renal/GU negative Renal ROS  negative genitourinary   Musculoskeletal  (+) Arthritis ,   Abdominal   Peds  Hematology negative hematology ROS (+)   Anesthesia Other Findings Arthritis  Knees  COPD    Does use hearing aid  Enlarged prostate    Hypercholesterolemia       Reproductive/Obstetrics negative OB ROS                             Anesthesia Physical  Anesthesia Plan  ASA: 3  Anesthesia Plan: General   Post-op Pain Management:    Induction: Intravenous  PONV Risk Score and Plan: 2 and Propofol infusion  Airway Management Planned: Oral ETT  Additional Equipment:   Intra-op Plan:   Post-operative Plan: Extubation in OR  Informed Consent: I have reviewed the patients History and Physical, chart, labs and discussed the procedure including the risks, benefits and alternatives for the proposed anesthesia with the patient or authorized representative who has indicated his/her understanding and acceptance.     Dental Advisory Given  Plan Discussed with: Anesthesiologist, CRNA and Surgeon  Anesthesia Plan Comments: (Patient consented for risks of anesthesia including but not limited to:  - adverse  reactions to medications - risk of airway placement if required - damage to eyes, teeth, lips or other oral mucosa - nerve damage due to positioning  - sore throat or hoarseness - Damage to heart, brain, nerves, lungs, other parts of body or loss of life  Patient voiced understanding.)       Anesthesia Quick Evaluation

## 2020-12-24 NOTE — Discharge Instructions (Signed)
AMBULATORY SURGERY  ?DISCHARGE INSTRUCTIONS ? ? ?The drugs that you were given will stay in your system until tomorrow so for the next 24 hours you should not: ? ?Drive an automobile ?Make any legal decisions ?Drink any alcoholic beverage ? ? ?You may resume regular meals tomorrow.  Today it is better to start with liquids and gradually work up to solid foods. ? ?You may eat anything you prefer, but it is better to start with liquids, then soup and crackers, and gradually work up to solid foods. ? ? ?Please notify your doctor immediately if you have any unusual bleeding, trouble breathing, redness and pain at the surgery site, drainage, fever, or pain not relieved by medication. ? ? ? ?Additional Instructions: ? ? ? ?Please contact your physician with any problems or Same Day Surgery at [phone], Monday through Friday 6 am to 4 pm, or [place] [facility] Main number at [phone].  ?

## 2021-01-01 ENCOUNTER — Telehealth: Payer: Self-pay | Admitting: Pulmonary Disease

## 2021-01-01 ENCOUNTER — Telehealth: Payer: Self-pay | Admitting: Nurse Practitioner

## 2021-01-01 NOTE — Telephone Encounter (Signed)
Spoke to patient, who is questioning if his cpap machine is apart of the recall.  Advised patient to register his machine with [facility]. Nothing further needed at this time.

## 2021-01-20 ENCOUNTER — Ambulatory Visit: Payer: Self-pay | Admitting: Nurse Practitioner

## 2021-01-20 ENCOUNTER — Telehealth: Payer: Self-pay | Admitting: *Deleted

## 2021-01-20 LAB — CBC AND DIFFERENTIAL
HCT: 44 (ref 41–53)
Hemoglobin: 15.4 (ref 13.5–17.5)
Neutrophils Absolute: 2348
Platelets: 121 — AB (ref 150–399)
WBC: 5.3

## 2021-01-20 LAB — BASIC METABOLIC PANEL
BUN: 22 — AB (ref 4–21)
CO2: 26 — AB (ref 13–22)
Chloride: 106 (ref 99–108)
Creatinine: 0.9 (ref 0.6–1.3)
Glucose: 107
Potassium: 1.7 — AB (ref 3.4–5.3)
Sodium: 139 (ref 137–147)

## 2021-01-20 LAB — COMPREHENSIVE METABOLIC PANEL
Albumin: 4.4 (ref 3.5–5.0)
Calcium: 9.8 (ref 8.7–10.7)
Globulin: 2.5

## 2021-01-20 LAB — LIPID PANEL
Cholesterol: 171 (ref 0–200)
HDL: 44 (ref 35–70)
LDL Cholesterol: 104
LDl/HDL Ratio: 3.9
Triglycerides: 130 (ref 40–160)

## 2021-01-20 LAB — HEPATIC FUNCTION PANEL
ALT: 19 (ref 10–40)
AST: 19 (ref 14–40)
Alkaline Phosphatase: 57 (ref 25–125)
Bilirubin, Total: 1.5

## 2021-01-20 LAB — CBC: RBC: 4.76 (ref 3.87–5.11)

## 2021-01-20 NOTE — Telephone Encounter (Signed)
Patient called from [facility] and stated that he just had labwork done this morning through [facility] and wants a PSA added to his labs. Stated that he has an appointment tomorrow with Janett Billow.   Stated that he is not having any problems but wants one done.   Forwarded to [employer] and [employer]

## 2021-01-20 NOTE — Telephone Encounter (Signed)
Can you add on PSA or see if this was already ordered? may have already order due to being on testosterone

## 2021-01-21 ENCOUNTER — Other Ambulatory Visit: Payer: Self-pay

## 2021-01-21 ENCOUNTER — Ambulatory Visit: Payer: Medicare Other | Admitting: Nurse Practitioner

## 2021-01-21 ENCOUNTER — Encounter: Payer: Self-pay | Admitting: Nurse Practitioner

## 2021-01-21 ENCOUNTER — Ambulatory Visit: Payer: Self-pay | Admitting: Nurse Practitioner

## 2021-01-21 VITALS — BP 138/78 | HR 58 | Resp 18 | Wt 209.0 lb

## 2021-01-21 DIAGNOSIS — M170 Bilateral primary osteoarthritis of knee: Secondary | ICD-10-CM | POA: Diagnosis not present

## 2021-01-21 DIAGNOSIS — R351 Nocturia: Secondary | ICD-10-CM

## 2021-01-21 DIAGNOSIS — J449 Chronic obstructive pulmonary disease, unspecified: Secondary | ICD-10-CM

## 2021-01-21 DIAGNOSIS — N401 Enlarged prostate with lower urinary tract symptoms: Secondary | ICD-10-CM

## 2021-01-21 DIAGNOSIS — E349 Endocrine disorder, unspecified: Secondary | ICD-10-CM | POA: Diagnosis not present

## 2021-01-21 DIAGNOSIS — I10 Essential (primary) hypertension: Secondary | ICD-10-CM

## 2021-01-21 DIAGNOSIS — E782 Mixed hyperlipidemia: Secondary | ICD-10-CM

## 2021-01-21 DIAGNOSIS — J342 Deviated nasal septum: Secondary | ICD-10-CM | POA: Diagnosis not present

## 2021-01-21 DIAGNOSIS — D696 Thrombocytopenia, unspecified: Secondary | ICD-10-CM

## 2021-01-21 NOTE — Progress Notes (Signed)
Careteam: Patient Care Team: Lauree Chandler, NP as PCP - General (Geriatric Medicine) Kate Sable, MD as PCP - Cardiology (Cardiology)  Advanced Directive information Does Patient Have a Medical Advance Directive?: No, Would patient like information on creating a medical advance directive?: No - Patient declined  Allergies  Allergen Reactions   Bee Pollen Anaphylaxis    Bee Venom   Bee Venom     Throat/mouth swelling    Chief Complaint  Patient presents with   Medical Management of Chronic Issues    6 month follow up.    Health Maintenance    Discuss need for shingles vaccine.      HPI: Patient is a 75 y.o. male seen in today at the [facility] for routine follow up. S/p nasal septoplasty. Healing well. Continues to do nasal flushes  He also went to [facility] and had endoscopy. Found to have reflux and was prescribed Protonix. He is also taking an OTC pepcid. ?if he needs both.  Biopsies were negative.   BPH- stable, last PSA 0.3 in february 2021 will add on to last labs.   Osteoarthritis of knees- uses voltarn gel and a spray that he got from his wife  Trying to make it to 79 for a knee replacement.   Hyperlipidemia- LDL elevated on last lab- had stopped zocor for sometime but has restarted. He is taking half every other day. Due to cost and if he cut in half it would last longer.   COPD- using Spiriva but uses PRN. Has nebulizer with albuterol which he uses PRN.   Insomnia- uses temazepam PRN.   Review of Systems:  Review of Systems  Constitutional:  Negative for chills, fever and weight loss.  HENT:  Negative for tinnitus.   Respiratory:  Negative for cough, sputum production and shortness of breath.   Cardiovascular:  Negative for chest pain, palpitations and leg swelling.  Gastrointestinal:  Negative for abdominal pain, constipation, diarrhea and heartburn.  Genitourinary:  Negative for dysuria, frequency and urgency.  Musculoskeletal:   Positive for myalgias. Negative for back pain, falls and joint pain.  Skin: Negative.   Neurological:  Negative for dizziness and headaches.  Psychiatric/Behavioral:  Negative for depression and memory loss. The patient does not have insomnia.    Past Medical History:  Diagnosis Date   Arthritis    Knees   COPD (chronic obstructive pulmonary disease) ([place])    Does use hearing aid    Bilateral   Enlarged prostate    GERD (gastroesophageal reflux disease)    Hypercholesterolemia    Past Surgical History:  Procedure Laterality Date   CHEEK AUGMENTATION Left 1970   REstructure of cheek bone    COLONOSCOPY  2017   Dr. Madolyn Frieze   ESOPHAGOGASTRODUODENOSCOPY (EGD) WITH PROPOFOL N/A 12/05/2020   Procedure: ESOPHAGOGASTRODUODENOSCOPY (EGD) WITH PROPOFOL;  Surgeon: [name], MD;  Location: [facility] ENDOSCOPY;  Service: Endoscopy;  Laterality: N/A;   EYE SURGERY Bilateral 2005   NASAL SEPTOPLASTY W/ TURBINOPLASTY Bilateral 12/24/2020   Procedure: NASAL SEPTOPLASTY WITH INFERIOR TURBINATE REDUCTION;  Surgeon: Carloyn Manner, MD;  Location: [facility] ORS;  Service: ENT;  Laterality: Bilateral;   UPPER GI ENDOSCOPY     Social History:   reports that he quit smoking about 42 years ago. His smoking use included cigarettes. He has a 35.00 pack-year smoking history. He has never used smokeless tobacco. He reports previous alcohol use. He reports that he does not use drugs.  Family History  Problem  Relation Age of Onset   Alzheimer's disease Mother    Diabetes Mother    Heart failure Father    Heart attack Father    Diabetes Father    Dementia Father     Medications: Patient's Medications  New Prescriptions   No medications on file  Previous Medications   ACETAMINOPHEN (TYLENOL) 500 MG TABLET    Take 500 mg by mouth as needed.   APOAEQUORIN (PREVAGEN) 10 MG CAPS    Take 1 capsule by mouth daily.    ASCORBIC ACID (VITAMIN C) 1000 MG TABLET    Take 1,000 mg by mouth daily.   CETAPHIL (CETAPHIL)  CREAM    Apply topically as needed.   CLOTRIMAZOLE-BETAMETHASONE (LOTRISONE) CREAM    Apply 1 application topically 2 (two) times daily.   CYCLOBENZAPRINE (FLEXERIL) 10 MG TABLET    Take one tablet by mouth twice daily as needed for muscle spasms.   FAMOTIDINE (PEPCID) 20 MG TABLET    Take 20 mg by mouth.   FEXOFENADINE (ALLEGRA) 180 MG TABLET    Take 180 mg by mouth daily.   FINASTERIDE (PROSCAR) 5 MG TABLET    TAKE 1 TABLET BY MOUTH  DAILY   FLUTICASONE (FLONASE) 50 MCG/ACT NASAL SPRAY    USE 1 SPRAY IN EACH NOSTRIL EVERY DAY   MELATONIN 5 MG TABS    Take 5 mg by mouth at bedtime.    METHYLSULFONYLMETHANE (MSM) 1000 MG CAPS    Take 1 capsule by mouth 2 (two) times daily.   NABUMETONE (RELAFEN) 750 MG TABLET    TAKE 1 TABLET BY MOUTH  TWICE DAILY AS NEEDED   PANTOPRAZOLE (PROTONIX) 40 MG TABLET    Take 1 tablet (40 mg total) by mouth daily.   SELENIUM 200 MCG TABS TABLET    Take by mouth daily.   SIMVASTATIN (ZOCOR) 40 MG TABLET    TAKE 1 TABLET BY MOUTH  DAILY   TAMSULOSIN (FLOMAX) 0.4 MG CAPS CAPSULE    TAKE 1 CAPSULE BY MOUTH  DAILY   TEMAZEPAM (RESTORIL) 15 MG CAPSULE    Take 1 capsule (15 mg total) by mouth at bedtime as needed for sleep.   TESTOSTERONE 2 MG/24HR PT24    Apply 1 patch daily   TIOTROPIUM BROMIDE MONOHYDRATE (SPIRIVA RESPIMAT) 2.5 MCG/ACT AERS    Inhale 2 puffs into the lungs daily.   VITAMIN B-12 (CYANOCOBALAMIN) 500 MCG TABLET    Take 500 mcg by mouth daily.  Modified Medications   No medications on file  Discontinued Medications   ALBUTEROL (VENTOLIN HFA) 108 (90 BASE) MCG/ACT INHALER    Inhale 2 puffs into the lungs every 4 (four) hours as needed for wheezing or shortness of breath.   B COMPLEX-C (SUPER B-COMPLEX + VITAMIN C PO)    Take by mouth.   BIOTIN 1000 MCG TABLET    Take 1,000 mcg by mouth daily.    CALCIUM ELEMENTAL AS CARBONATE (BARIATRIC TUMS ULTRA) 400 MG CHEWABLE TABLET    Chew 1,000 mg by mouth daily as needed.    CHOLECALCIFEROL (VITAMIN D) 50 MCG  (2000 UT) TABLET    Take 2,000 Units by mouth daily. 2 daily   DOCUSATE SODIUM (COLACE) 100 MG CAPSULE    Take 1 capsule (100 mg total) by mouth 2 (two) times daily.   GARLIC 0254 MG CAPS    Take by mouth.   LORATADINE (CLARITIN) 10 MG TABLET    Take 1 tablet (10 mg total) by mouth daily.  MISC NATURAL PRODUCTS (OSTEO BI-FLEX ADV TRIPLE ST) TABS    Take by mouth.   MULTIPLE VITAMINS-MINERALS (ONE DAILY MENS HEALTH) TABS    Take by mouth.   OMEGA-3 1000 MG CAPS    Take by mouth.   ONDANSETRON (ZOFRAN) 4 MG TABLET    Take 1 tablet (4 mg total) by mouth every 8 (eight) hours as needed for up to 10 doses for nausea or vomiting.   SAW PALMETTO 450 MG CAPS    Take by mouth.    Physical Exam:  Vitals:   01/21/21 1100  BP: 138/78  Pulse: (!) 58  Resp: 18  SpO2: 98%  Weight: 209 lb (94.8 kg)   Body mass index is 27.57 kg/m. Wt Readings from Last 3 Encounters:  01/21/21 209 lb (94.8 kg)  12/24/20 212 lb (96.2 kg)  12/18/20 212 lb (96.2 kg)    Physical Exam Constitutional:      General: He is not in acute distress.    Appearance: He is well-developed. He is not diaphoretic.  HENT:     Head: Normocephalic and atraumatic.     Right Ear: External ear normal.     Left Ear: External ear normal.     Mouth/Throat:     Pharynx: No oropharyngeal exudate.  Eyes:     Conjunctiva/sclera: Conjunctivae normal.     Pupils: Pupils are equal, round, and reactive to light.  Cardiovascular:     Rate and Rhythm: Normal rate and regular rhythm.     Heart sounds: Normal heart sounds.  Pulmonary:     Effort: Pulmonary effort is normal.     Breath sounds: Normal breath sounds.  Abdominal:     General: Bowel sounds are normal.     Palpations: Abdomen is soft.  Musculoskeletal:        General: No tenderness.     Cervical back: Normal range of motion and neck supple.     Right lower leg: No edema.     Left lower leg: No edema.  Skin:    General: Skin is warm and dry.  Neurological:     Mental  Status: He is alert and oriented to person, place, and time.    Labs reviewed: Basic Metabolic Panel: Recent Labs    03/21/20 1005 05/15/20 0000  NA  --  137  K  --  4.7  CL  --  105  CO2  --  29*  BUN  --  16  CREATININE 0.90 0.9  CALCIUM  --  10.0   Liver Function Tests: Recent Labs    05/15/20 0000  AST 29  ALT 34  ALKPHOS 64  ALBUMIN 4.6   No results for input(s): LIPASE, AMYLASE in the last 8760 hours. No results for input(s): AMMONIA in the last 8760 hours. CBC: Recent Labs    05/15/20 0000  WBC 5.8  NEUTROABS 3.05  HGB 15.6  HCT 44  PLT 127*   Lipid Panel: Recent Labs    05/15/20 0000  CHOL 165  HDL 40  LDLCALC 83  TRIG 210*   TSH: No results for input(s): TSH in the last 8760 hours. A1C: No results found for: HGBA1C   Assessment/Plan 1. Testosterone deficiency -continues on supplement, PSA added to labs.   2. Primary osteoarthritis of both knees -continues to wear braces and use voltaren gel PRN  3. Chronic obstructive pulmonary disease, unspecified COPD type ([place]) Stable, using spiriva PRN,encouraged to take as prescribed.   4. Deviated nasal septum -  s/p asal septoplasty, healing well. Followed by ENT   5. Thrombocytopenia (HCC) Platelets stable at this time, no signs of abnormal bruising or bleeding   6. Essential hypertension -well controlled at this time. Continue dietary modifications.   7. BPH associated with nocturia -stable, continues on proscar and flomax  8. Mixed hyperlipidemia -stopped zocor but has restarted-, LDL slightly elevated. He has restarted now. To have him continue and will follow up with next blood work.    Next appt: 6 months, labs prior  Layth Cerezo K. [place], [facility] [phone]

## 2021-01-22 ENCOUNTER — Other Ambulatory Visit: Payer: Self-pay

## 2021-01-22 ENCOUNTER — Encounter: Payer: Self-pay | Admitting: Nurse Practitioner

## 2021-01-22 ENCOUNTER — Telehealth: Payer: Self-pay

## 2021-01-22 ENCOUNTER — Ambulatory Visit: Payer: Medicare Other | Admitting: Nurse Practitioner

## 2021-01-22 DIAGNOSIS — Z0000 Encounter for general adult medical examination without abnormal findings: Secondary | ICD-10-CM

## 2021-01-22 NOTE — Telephone Encounter (Signed)
Mr. callan, norden are scheduled for a virtual visit with your provider today.    Just as we do with appointments in the office, we must obtain your consent to participate.  Your consent will be active for this visit and any virtual visit you may have with one of our providers in the next 365 days.    If you have a MyChart account, I can also send a copy of this consent to you electronically.  All virtual visits are billed to your insurance company just like a traditional visit in the office.  As this is a virtual visit, video technology does not allow for your provider to perform a traditional examination.  This may limit your provider's ability to fully assess your condition.  If your provider identifies any concerns that need to be evaluated in person or the need to arrange testing such as labs, EKG, etc, we will make arrangements to do so.    Although advances in technology are sophisticated, we cannot ensure that it will always work on either your end or our end.  If the connection with a video visit is poor, we may have to switch to a telephone visit.  With either a video or telephone visit, we are not always able to ensure that we have a secure connection.   I need to obtain your verbal consent now.   Are you willing to proceed with your visit today?   Christine Schiefelbein has provided verbal consent on 01/22/2021 for a virtual visit (video or telephone).   [name], CMA 01/22/2021  2:45 PM

## 2021-01-22 NOTE — Progress Notes (Signed)
This service is provided via telemedicine  No vital signs collected/recorded due to the encounter was a telemedicine visit.   Location of patient (ex: home, work):  Home  Patient consents to a telephone visit:  Yes, see encounter dated 01/22/2021  Location of the provider (ex: office, home):  Home  Name of any referring provider:  N/A  Names of all persons participating in the telemedicine service and their role in the encounter:  Sherrie Mustache, Nurse Practitioner, [name], CMA, and patient.    Time spent on call:  11 minutes with medical assistant

## 2021-01-22 NOTE — Progress Notes (Signed)
Subjective:   Joshua Mercado is a 75 y.o. male who presents for Medicare Annual/Subsequent preventive examination.  Review of Systems    Cardiac Risk Factors include: advanced age (>50men, >19 women);hypertension;dyslipidemia;male gender     Objective:    Today's Vitals   01/22/21 1113  PainSc: 2    There is no height or weight on file to calculate BMI.  Advanced Directives 01/22/2021 01/21/2021 12/24/2020 12/18/2020 12/05/2020 07/23/2020 01/18/2020  Does Patient Have a Medical Advance Directive? Yes No Yes Yes Yes Yes Yes  Type of Advance Directive Out of facility DNR (pink MOST or yellow form) - Press photographer;Living will Living will Living will Out of facility DNR (pink MOST or yellow form) Out of facility DNR (pink MOST or yellow form)  Does patient want to make changes to medical advance directive? - - No - Patient declined - - No - Patient declined No - Patient declined  Copy of [place] in Chart? - - No - copy requested - - - -  Would patient like information on creating a medical advance directive? - No - Patient declined - - - - -  Pre-existing out of facility DNR order (yellow form or pink MOST form) Pink MOST form placed in chart (order not valid for inpatient use) - - - - Pink MOST form placed in chart (order not valid for inpatient use) Pink MOST form placed in chart (order not valid for inpatient use)    Current Medications (verified) Outpatient Encounter Medications as of 01/22/2021  Medication Sig   acetaminophen (TYLENOL) 500 MG tablet Take 500 mg by mouth as needed.   Apoaequorin (PREVAGEN) 10 MG CAPS Take 1 capsule by mouth daily.    Ascorbic Acid (VITAMIN C) 1000 MG tablet Take 1,000 mg by mouth daily.   cetaphil (CETAPHIL) cream Apply topically as needed.   clotrimazole-betamethasone (LOTRISONE) cream Apply 1 application topically 2 (two) times daily.   cyclobenzaprine (FLEXERIL) 10 MG tablet Take one tablet by mouth twice daily as needed  for muscle spasms.   famotidine (PEPCID) 20 MG tablet Take 20 mg by mouth.   fexofenadine (ALLEGRA) 180 MG tablet Take 180 mg by mouth daily.   finasteride (PROSCAR) 5 MG tablet TAKE 1 TABLET BY MOUTH  DAILY   fluticasone (FLONASE) 50 MCG/ACT nasal spray USE 1 SPRAY IN EACH NOSTRIL EVERY DAY   melatonin 5 MG TABS Take 5 mg by mouth at bedtime.    Methylsulfonylmethane (MSM) 1000 MG CAPS Take 1 capsule by mouth 2 (two) times daily.   nabumetone (RELAFEN) 750 MG tablet TAKE 1 TABLET BY MOUTH  TWICE DAILY AS NEEDED   pantoprazole (PROTONIX) 40 MG tablet Take 1 tablet (40 mg total) by mouth daily.   selenium 200 MCG TABS tablet Take by mouth daily.   simvastatin (ZOCOR) 40 MG tablet TAKE 1 TABLET BY MOUTH  DAILY   tamsulosin (FLOMAX) 0.4 MG CAPS capsule TAKE 1 CAPSULE BY MOUTH  DAILY   temazepam (RESTORIL) 15 MG capsule Take 1 capsule (15 mg total) by mouth at bedtime as needed for sleep.   Testosterone 2 MG/24HR PT24 Apply 1 patch daily   Tiotropium Bromide Monohydrate (SPIRIVA RESPIMAT) 2.5 MCG/ACT AERS Inhale 2 puffs into the lungs daily.   vitamin B-12 (CYANOCOBALAMIN) 500 MCG tablet Take 500 mcg by mouth daily.   No facility-administered encounter medications on file as of 01/22/2021.    Allergies (verified) Bee pollen and Bee venom   History: Past Medical History:  Diagnosis Date   Arthritis    Knees   COPD (chronic obstructive pulmonary disease) (HCC)    Does use hearing aid    Bilateral   Enlarged prostate    GERD (gastroesophageal reflux disease)    Hypercholesterolemia    Past Surgical History:  Procedure Laterality Date   CHEEK AUGMENTATION Left 1970   REstructure of cheek bone    COLONOSCOPY  2017   Dr. Madolyn Frieze   ESOPHAGOGASTRODUODENOSCOPY (EGD) WITH PROPOFOL N/A 12/05/2020   Procedure: ESOPHAGOGASTRODUODENOSCOPY (EGD) WITH PROPOFOL;  Surgeon: [name], MD;  Location: [facility] ENDOSCOPY;  Service: Endoscopy;  Laterality: N/A;   EYE SURGERY Bilateral 2005   NASAL  SEPTOPLASTY W/ TURBINOPLASTY Bilateral 12/24/2020   Procedure: NASAL SEPTOPLASTY WITH INFERIOR TURBINATE REDUCTION;  Surgeon: Carloyn Manner, MD;  Location: [facility] ORS;  Service: ENT;  Laterality: Bilateral;   UPPER GI ENDOSCOPY     Family History  Problem Relation Age of Onset   Alzheimer's disease Mother    Diabetes Mother    Heart failure Father    Heart attack Father    Diabetes Father    Dementia Father    Social History   Socioeconomic History   Marital status: Married    Spouse name: Not on file   Number of children: Not on file   Years of education: Not on file   Highest education level: Not on file  Occupational History   Not on file  Tobacco Use   Smoking status: Former    Packs/day: 1.00    Years: 35.00    Pack years: 35.00    Types: Cigarettes    Quit date: 1980    Years since quitting: 42.5   Smokeless tobacco: Never  Vaping Use   Vaping Use: Never used  Substance and Sexual Activity   Alcohol use: Not Currently   Drug use: Never   Sexual activity: Not on file  Other Topics Concern   Not on file  Social History Narrative   No pets.  Clinical biochemist.  Trade school education.  Wife is a retired Engineer, drilling.    Social Determinants of Health   Financial Resource Strain: Not on file  Food Insecurity: Not on file  Transportation Needs: Not on file  Physical Activity: Not on file  Stress: Not on file  Social Connections: Not on file    Tobacco Counseling Counseling given: Not Answered   Clinical Intake:  Pre-visit preparation completed: Yes  Pain : 0-10 Pain Score: 2  Pain Type: Chronic pain Pain Location: Knee Pain Orientation: Right, Left Pain Descriptors / Indicators: Aching Pain Onset: More than a month ago Pain Frequency: Intermittent Pain Relieving Factors: rubs  Pain Relieving Factors: rubs  BMI - recorded: 27 Nutritional Status: BMI 25 -29 Overweight Nutritional Risks: None Diabetes: No  How often do you need to have someone help  you when you read instructions, pamphlets, or other written materials from your doctor or pharmacy?: 1 - Never  Diabetic?no         Activities of Daily Living In your present state of health, do you have any difficulty performing the following activities: 01/22/2021 12/18/2020  Hearing? Y Y  Comment - wears hearing aids  Vision? N N  Difficulty concentrating or making decisions? N N  Walking or climbing stairs? N Y  Comment - some, due to knee pain  Dressing or bathing? N N  Doing errands, shopping? N N  Preparing Food and eating ? N -  Using the Toilet? N -  In the past six months, have you accidently leaked urine? N -  Do you have problems with loss of bowel control? N -  Managing your Medications? N -  Managing your Finances? N -  Housekeeping or managing your Housekeeping? N -  Some recent data might be hidden    Patient Care Team: Lauree Chandler, NP as PCP - General (Geriatric Medicine) Kate Sable, MD as PCP - Cardiology (Cardiology)  Indicate any recent Medical Services you may have received from other than [facility] providers in the past year (date may be approximate).     Assessment:   This is a routine wellness examination for [facility].  Hearing/Vision screen Hearing Screening - Comments:: Patient wears hearing aids/ Vision Screening - Comments:: Patient wears glasses, but no vision problems. Patient had eye exam last month. Patient sees Dr. Valetta Close  Dietary issues and exercise activities discussed: Current Exercise Habits: Home exercise routine, Type of exercise: strength training/weights;Other - see comments, Time (Minutes): 60, Frequency (Times/Week): 4, Weekly Exercise (Minutes/Week): 240   Goals Addressed             This Visit's Progress    Patient Stated       To bring glucose down        Depression Screen PHQ 2/9 Scores 01/22/2021 01/18/2020 01/16/2020 11/21/2019  PHQ - 2 Score 0 0 0 0    Fall Risk Fall Risk  01/22/2021 01/21/2021 01/18/2020  01/16/2020 11/21/2019  Falls in the past year? 0 0 0 0 0  Number falls in past yr: 0 0 0 0 0  Injury with Fall? 0 0 0 0 0  Risk for fall due to : No Fall Risks No Fall Risks - - -  Follow up Falls evaluation completed Falls evaluation completed - - -    FALL RISK PREVENTION PERTAINING TO THE HOME:  Any stairs in or around the home? No  If so, are there any without handrails? No  Home free of loose throw rugs in walkways, pet beds, electrical cords, etc? Yes  Adequate lighting in your home to reduce risk of falls? Yes   ASSISTIVE DEVICES UTILIZED TO PREVENT FALLS:  Life alert? No  Use of a cane, walker or w/c? No  Grab bars in the bathroom? Yes  Shower chair or bench in shower? Yes  Elevated toilet seat or a handicapped toilet? Yes   TIMED UP AND GO:  Was the test performed? No .    Cognitive Function:     6CIT Screen 01/22/2021 01/16/2020  What Year? 0 points 0 points  What month? 0 points 0 points  What time? 0 points 0 points  Count back from 20 0 points 0 points  Months in reverse 0 points 0 points  Repeat phrase 2 points 2 points  Total Score 2 2    Immunizations Immunization History  Administered Date(s) Administered   Influenza, High Dose Seasonal PF 04/11/2015, 04/15/2016   Influenza-Unspecified 05/14/2019, 05/02/2020   Moderna Sars-Covid-2 Vaccination 08/16/2019, 09/13/2019, 05/28/2020, 11/28/2020   Pneumococcal Conjugate-13 04/13/2015, 05/13/2015   Pneumococcal Polysaccharide-23 11/24/2012   Tdap 12/11/2016   Zoster, Live 06/13/2007    TDAP status: Up to date  Flu Vaccine status: Up to date  Pneumococcal vaccine status: Up to date  Covid-19 vaccine status: Completed vaccines  Qualifies for Shingles Vaccine? Yes   Zostavax completed No   Shingrix Completed?: Yes  Screening Tests Health Maintenance  Topic Date Due   Zoster Vaccines- Shingrix (1 of 2) Never done  INFLUENZA VACCINE  02/10/2021   COLONOSCOPY (Pts 45-78yrs Insurance coverage will  need to be confirmed)  07/13/2021   TETANUS/TDAP  12/12/2026   COVID-19 Vaccine  Completed   Hepatitis C Screening  Completed   PNA vac Low Risk Adult  Completed   HPV VACCINES  Aged Out    Health Maintenance  Health Maintenance Due  Topic Date Due   Zoster Vaccines- Shingrix (1 of 2) Never done    Colorectal cancer screening: Type of screening: Colonoscopy. Completed 2018. Repeat every 5 years  Lung Cancer Screening: (Low Dose CT Chest recommended if Age 44-80 years, 30 pack-year currently smoking OR have quit w/in 15years.) does not qualify.   Lung Cancer Screening Referral: na  Additional Screening:  Hepatitis C Screening: does qualify; Completed 2010  Vision Screening: Recommended annual ophthalmology exams for early detection of glaucoma and other disorders of the eye. Is the patient up to date with their annual eye exam?  Yes  Who is the provider or what is the name of the office in which the patient attends annual eye exams? Dr Valetta Close If pt is not established with a provider, would they like to be referred to a provider to establish care? No .   Dental Screening: Recommended annual dental exams for proper oral hygiene  Community Resource Referral / Chronic Care Management: CRR required this visit?  No   CCM required this visit?  No      Plan:     I have personally reviewed and noted the following in the patient's chart:   Medical and social history Use of alcohol, tobacco or illicit drugs  Current medications and supplements including opioid prescriptions. Patient is not currently taking opioid prescriptions. Functional ability and status Nutritional status Physical activity Advanced directives List of other physicians Hospitalizations, surgeries, and ER visits in previous 12 months Vitals Screenings to include cognitive, depression, and falls Referrals and appointments  In addition, I have reviewed and discussed with patient certain preventive  protocols, quality metrics, and best practice recommendations. A written personalized care plan for preventive services as well as general preventive health recommendations were provided to patient.     Lauree Chandler, NP   01/22/2021    Virtual Visit via Telephone Note  I connected withNAME@ on 01/22/21 at 11:00 AM EDT by telephone and verified that I am speaking with the correct person using two identifiers.  Location: Patient: home Provider: [facility]   I discussed the limitations, risks, security and privacy concerns of performing an evaluation and management service by telephone and the availability of in person appointments. I also discussed with the patient that there may be a patient responsible charge related to this service. The patient expressed understanding and agreed to proceed.   I discussed the assessment and treatment plan with the patient. The patient was provided an opportunity to ask questions and all were answered. The patient agreed with the plan and demonstrated an understanding of the instructions.   The patient was advised to call back or seek an in-person evaluation if the symptoms worsen or if the condition fails to improve as anticipated.  I provided 15 minutes of non-face-to-face time during this encounter.  [employer]. Harle Battiest Avs printed and mailed

## 2021-01-22 NOTE — Patient Instructions (Signed)
Joshua Mercado , Thank you for taking time to come for your Medicare Wellness Visit. I appreciate your ongoing commitment to your health goals. Please review the following plan we discussed and let me know if I can assist you in the future.   Screening recommendations/referrals: Colonoscopy up to date Recommended yearly ophthalmology/optometry visit for glaucoma screening and checkup Recommended yearly dental visit for hygiene and checkup  Vaccinations: Influenza vaccine up to date Pneumococcal vaccine up to date Tdap vaccine up to date  Shingles vaccine please let us know vaccine date or bring record at your next appt  Advanced directives: bring updated directives with you to next office visit to place on file.   Conditions/risks identified: advanced age  Next appointment: 1 year for AWV  Preventive Care 20 Years and Older, Male Preventive care refers to lifestyle choices and visits with your health care provider that can promote health and wellness. What does preventive care include? A yearly physical exam. This is also called an annual well check. Dental exams once or twice a year. Routine eye exams. Ask your health care provider how often you should have your eyes checked. Personal lifestyle choices, including: Daily care of your teeth and gums. Regular physical activity. Eating a healthy diet. Avoiding tobacco and drug use. Limiting alcohol use. Practicing safe sex. Taking low doses of aspirin every day. Taking vitamin and mineral supplements as recommended by your health care provider. What happens during an annual well check? The services and screenings done by your health care provider during your annual well check will depend on your age, overall health, lifestyle risk factors, and family history of disease. Counseling  Your health care provider may ask you questions about your: Alcohol use. Tobacco use. Drug use. Emotional well-being. Home and relationship  well-being. Sexual activity. Eating habits. History of falls. Memory and ability to understand (cognition). Work and work Statistician. Screening  You may have the following tests or measurements: Height, weight, and BMI. Blood pressure. Lipid and cholesterol levels. These may be checked every 5 years, or more frequently if you are over 60 years old. Skin check. Lung cancer screening. You may have this screening every year starting at age 2 if you have a 30-pack-year history of smoking and currently smoke or have quit within the past 15 years. Fecal occult blood test (FOBT) of the stool. You may have this test every year starting at age 26. Flexible sigmoidoscopy or colonoscopy. You may have a sigmoidoscopy every 5 years or a colonoscopy every 10 years starting at age 64. Prostate cancer screening. Recommendations will vary depending on your family history and other risks. Hepatitis C blood test. Hepatitis B blood test. Sexually transmitted disease (STD) testing. Diabetes screening. This is done by checking your blood sugar (glucose) after you have not eaten for a while (fasting). You may have this done every 1-3 years. Abdominal aortic aneurysm (AAA) screening. You may need this if you are a current or former smoker. Osteoporosis. You may be screened starting at age 28 if you are at high risk. Talk with your health care provider about your test results, treatment options, and if necessary, the need for more tests. Vaccines  Your health care provider may recommend certain vaccines, such as: Influenza vaccine. This is recommended every year. Tetanus, diphtheria, and acellular pertussis (Tdap, Td) vaccine. You may need a Td booster every 10 years. Zoster vaccine. You may need this after age 88. Pneumococcal 13-valent conjugate (PCV13) vaccine. One dose is recommended after age  65. Pneumococcal polysaccharide (PPSV23) vaccine. One dose is recommended after age 85. Talk to your health care  provider about which screenings and vaccines you need and how often you need them. This information is not intended to replace advice given to you by your health care provider. Make sure you discuss any questions you have with your health care provider. Document Released: 07/26/2015 Document Revised: 03/18/2016 Document Reviewed: 04/30/2015 Elsevier Interactive Patient Education  2017 [employer] Prevention in the Home Falls can cause injuries. They can happen to people of all ages. There are many things you can do to make your home safe and to help prevent falls. What can I do on the outside of my home? Regularly fix the edges of walkways and driveways and fix any cracks. Remove anything that might make you trip as you walk through a door, such as a raised step or threshold. Trim any bushes or trees on the path to your home. Use bright outdoor lighting. Clear any walking paths of anything that might make someone trip, such as rocks or tools. Regularly check to see if handrails are loose or broken. Make sure that both sides of any steps have handrails. Any raised decks and porches should have guardrails on the edges. Have any leaves, snow, or ice cleared regularly. Use sand or salt on walking paths during winter. Clean up any spills in your garage right away. This includes oil or grease spills. What can I do in the bathroom? Use night lights. Install grab bars by the toilet and in the tub and shower. Do not use towel bars as grab bars. Use non-skid mats or decals in the tub or shower. If you need to sit down in the shower, use a plastic, non-slip stool. Keep the floor dry. Clean up any water that spills on the floor as soon as it happens. Remove soap buildup in the tub or shower regularly. Attach bath mats securely with double-sided non-slip rug tape. Do not have throw rugs and other things on the floor that can make you trip. What can I do in the bedroom? Use night lights. Make  sure that you have a light by your bed that is easy to reach. Do not use any sheets or blankets that are too big for your bed. They should not hang down onto the floor. Have a firm chair that has side arms. You can use this for support while you get dressed. Do not have throw rugs and other things on the floor that can make you trip. What can I do in the kitchen? Clean up any spills right away. Avoid walking on wet floors. Keep items that you use a lot in easy-to-reach places. If you need to reach something above you, use a strong step stool that has a grab bar. Keep electrical cords out of the way. Do not use floor polish or wax that makes floors slippery. If you must use wax, use non-skid floor wax. Do not have throw rugs and other things on the floor that can make you trip. What can I do with my stairs? Do not leave any items on the stairs. Make sure that there are handrails on both sides of the stairs and use them. Fix handrails that are broken or loose. Make sure that handrails are as long as the stairways. Check any carpeting to make sure that it is firmly attached to the stairs. Fix any carpet that is loose or worn. Avoid having throw rugs at  the top or bottom of the stairs. If you do have throw rugs, attach them to the floor with carpet tape. Make sure that you have a light switch at the top of the stairs and the bottom of the stairs. If you do not have them, ask someone to add them for you. What else can I do to help prevent falls? Wear shoes that: Do not have high heels. Have rubber bottoms. Are comfortable and fit you well. Are closed at the toe. Do not wear sandals. If you use a stepladder: Make sure that it is fully opened. Do not climb a closed stepladder. Make sure that both sides of the stepladder are locked into place. Ask someone to hold it for you, if possible. Clearly mark and make sure that you can see: Any grab bars or handrails. First and last steps. Where the  edge of each step is. Use tools that help you move around (mobility aids) if they are needed. These include: Canes. Walkers. Scooters. Crutches. Turn on the lights when you go into a dark area. Replace any light bulbs as soon as they burn out. Set up your furniture so you have a clear path. Avoid moving your furniture around. If any of your floors are uneven, fix them. If there are any pets around you, be aware of where they are. Review your medicines with your doctor. Some medicines can make you feel dizzy. This can increase your chance of falling. Ask your doctor what other things that you can do to help prevent falls. This information is not intended to replace advice given to you by your health care provider. Make sure you discuss any questions you have with your health care provider. Document Released: 04/25/2009 Document Revised: 12/05/2015 Document Reviewed: 08/03/2014 Elsevier Interactive Patient Education  2017 [employer].

## 2021-01-28 ENCOUNTER — Ambulatory Visit: Payer: Medicare Other | Admitting: Pulmonary Disease

## 2021-03-12 ENCOUNTER — Ambulatory Visit: Payer: Medicare Other | Admitting: Pulmonary Disease

## 2021-04-03 ENCOUNTER — Ambulatory Visit: Payer: Medicare Other | Admitting: Nurse Practitioner

## 2021-04-03 ENCOUNTER — Encounter: Payer: Self-pay | Admitting: Nurse Practitioner

## 2021-04-03 ENCOUNTER — Other Ambulatory Visit: Payer: Self-pay

## 2021-04-03 VITALS — BP 130/80 | HR 55 | Temp 97.8°F | Ht 73.0 in | Wt 207.0 lb

## 2021-04-03 DIAGNOSIS — L309 Dermatitis, unspecified: Secondary | ICD-10-CM | POA: Diagnosis not present

## 2021-04-03 MED ORDER — TRIAMCINOLONE ACETONIDE 0.1 % EX CREA
1.0000 | TOPICAL_CREAM | Freq: Two times a day (BID) | CUTANEOUS | 0 refills | Status: DC
Start: 2021-04-03 — End: 2021-05-01

## 2021-04-03 NOTE — Progress Notes (Signed)
Careteam: Patient Care Team: Lauree Chandler, NP as PCP - General (Geriatric Medicine) Kate Sable, MD as PCP - Cardiology (Cardiology)  Advanced Directive information    Allergies  Allergen Reactions   Bee Pollen Anaphylaxis    Bee Venom   Bee Venom     Throat/mouth swelling    Chief Complaint  Patient presents with   Acute Visit    Patient has rash on back of both legs. No itching.Patient has been using Triamcinolone cream. Patient has had rash for a couple of months.Started down around ankles and spread up leg. Triamcinolone seems to be helping.     HPI: Patient is a 75 y.o. male due to discoloration on back on legs.  Did not itch. Caused some discomfort but thought it was related to varicose veins.  Mentioned it to wife who is retired MD. Michela Pitcher it was a rash and recommended cortisone cream. They had triamcinolone and he started using that which has flatten out bumps on back of his legs.   Using once daily. Did not use last week.  Wants refill on medication. Has made a significant change.  Area was red but he wain the sun last week, now darker but improved.   Review of Systems:  Review of Systems  Constitutional:  Negative for chills and fever.  Skin:  Positive for rash. Negative for itching.   Past Medical History:  Diagnosis Date   Arthritis    Knees   COPD (chronic obstructive pulmonary disease) ([place])    Does use hearing aid    Bilateral   Enlarged prostate    GERD (gastroesophageal reflux disease)    Hypercholesterolemia    Past Surgical History:  Procedure Laterality Date   CHEEK AUGMENTATION Left 1970   REstructure of cheek bone    COLONOSCOPY  2017   Dr. Madolyn Frieze   ESOPHAGOGASTRODUODENOSCOPY (EGD) WITH PROPOFOL N/A 12/05/2020   Procedure: ESOPHAGOGASTRODUODENOSCOPY (EGD) WITH PROPOFOL;  Surgeon: [name], MD;  Location: [facility] ENDOSCOPY;  Service: Endoscopy;  Laterality: N/A;   EYE SURGERY Bilateral 2005   NASAL SEPTOPLASTY W/  TURBINOPLASTY Bilateral 12/24/2020   Procedure: NASAL SEPTOPLASTY WITH INFERIOR TURBINATE REDUCTION;  Surgeon: Carloyn Manner, MD;  Location: [facility] ORS;  Service: ENT;  Laterality: Bilateral;   UPPER GI ENDOSCOPY     Social History:   reports that he quit smoking about 42 years ago. His smoking use included cigarettes. He has a 35.00 pack-year smoking history. He has never used smokeless tobacco. He reports that he does not currently use alcohol. He reports that he does not use drugs.  Family History  Problem Relation Age of Onset   Alzheimer's disease Mother    Diabetes Mother    Heart failure Father    Heart attack Father    Diabetes Father    Dementia Father     Medications: Patient's Medications  New Prescriptions   No medications on file  Previous Medications   ACETAMINOPHEN (TYLENOL) 500 MG TABLET    Take 500 mg by mouth as needed.   APOAEQUORIN (PREVAGEN) 10 MG CAPS    Take 1 capsule by mouth daily.    ASCORBIC ACID (VITAMIN C) 1000 MG TABLET    Take 1,000 mg by mouth daily.   CETAPHIL (CETAPHIL) CREAM    Apply topically as needed.   CLOTRIMAZOLE-BETAMETHASONE (LOTRISONE) CREAM    Apply 1 application topically 2 (two) times daily.   CYCLOBENZAPRINE (FLEXERIL) 10 MG TABLET    Take one tablet by mouth twice  daily as needed for muscle spasms.   FAMOTIDINE (PEPCID) 20 MG TABLET    Take 20 mg by mouth.   FEXOFENADINE (ALLEGRA) 180 MG TABLET    Take 180 mg by mouth daily.   FINASTERIDE (PROSCAR) 5 MG TABLET    TAKE 1 TABLET BY MOUTH  DAILY   FLUTICASONE (FLONASE) 50 MCG/ACT NASAL SPRAY    USE 1 SPRAY IN EACH NOSTRIL EVERY DAY   MELATONIN 5 MG TABS    Take 5 mg by mouth at bedtime.    METHYLSULFONYLMETHANE (MSM) 1000 MG CAPS    Take 1 capsule by mouth 2 (two) times daily.   NABUMETONE (RELAFEN) 750 MG TABLET    TAKE 1 TABLET BY MOUTH  TWICE DAILY AS NEEDED   PANTOPRAZOLE (PROTONIX) 40 MG TABLET    Take 1 tablet (40 mg total) by mouth daily.   SELENIUM 200 MCG TABS TABLET    Take  by mouth daily.   SIMVASTATIN (ZOCOR) 40 MG TABLET    TAKE 1 TABLET BY MOUTH  DAILY   TAMSULOSIN (FLOMAX) 0.4 MG CAPS CAPSULE    TAKE 1 CAPSULE BY MOUTH  DAILY   TEMAZEPAM (RESTORIL) 15 MG CAPSULE    Take 1 capsule (15 mg total) by mouth at bedtime as needed for sleep.   TESTOSTERONE 2 MG/24HR PT24    Apply 1 patch daily   TIOTROPIUM BROMIDE MONOHYDRATE (SPIRIVA RESPIMAT) 2.5 MCG/ACT AERS    Inhale 2 puffs into the lungs daily.   VITAMIN B-12 (CYANOCOBALAMIN) 500 MCG TABLET    Take 500 mcg by mouth daily.  Modified Medications   No medications on file  Discontinued Medications   No medications on file    Physical Exam:  Vitals:   04/03/21 1110  BP: 130/80  Pulse: (!) 55  Temp: 97.8 F (36.6 C)  TempSrc: Oral  SpO2: 96%  Weight: 207 lb (93.9 kg)  Height: 6\' 1"  (1.854 m)   Body mass index is 27.31 kg/m. Wt Readings from Last 3 Encounters:  04/03/21 207 lb (93.9 kg)  01/21/21 209 lb (94.8 kg)  12/24/20 212 lb (96.2 kg)    Physical Exam Constitutional:      Appearance: Normal appearance.  Musculoskeletal:     Right lower leg: No edema.     Left lower leg: No edema.  Skin:    Findings: Rash (patchy flat rash noted to back of lower legs bilaterally, no heat or drainage) present.  Neurological:     Mental Status: He is alert.    Labs reviewed: Basic Metabolic Panel: Recent Labs    05/15/20 0000 01/20/21 0000  NA 137 139  K 4.7 1.7*  CL 105 106  CO2 29* 26*  BUN 16 22*  CREATININE 0.9 0.9  CALCIUM 10.0 9.8   Liver Function Tests: Recent Labs    05/15/20 0000 01/20/21 0000  AST 29 19  ALT 34 19  ALKPHOS 64 57  ALBUMIN 4.6 4.4   No results for input(s): LIPASE, AMYLASE in the last 8760 hours. No results for input(s): AMMONIA in the last 8760 hours. CBC: Recent Labs    05/15/20 0000 01/20/21 0000  WBC 5.8 5.3  NEUTROABS 3.05 2,348.00  HGB 15.6 15.4  HCT 44 44  PLT 127* 121*   Lipid Panel: Recent Labs    05/15/20 0000 01/20/21 0000  CHOL 165  171  HDL 40 44  LDLCALC 83 104  TRIG 210* 130   TSH: No results for input(s): TSH in the last  8760 hours. A1C: No results found for: HGBA1C   Assessment/Plan 1. Dermatitis Improved after using kenalog cream but not consistently.  Would like Rx refill. Educated to use for over 1 week.  -to follow up if symptoms worsen or fail to improve.  - triamcinolone cream (KENALOG) 0.1 %; Apply 1 application topically 2 (two) times daily.  Dispense: 80 g; Refill: 0

## 2021-05-01 ENCOUNTER — Other Ambulatory Visit: Payer: Self-pay | Admitting: Nurse Practitioner

## 2021-05-01 DIAGNOSIS — L309 Dermatitis, unspecified: Secondary | ICD-10-CM

## 2021-05-06 ENCOUNTER — Other Ambulatory Visit: Payer: Self-pay | Admitting: Nurse Practitioner

## 2021-05-13 ENCOUNTER — Encounter: Payer: Self-pay | Admitting: Nurse Practitioner

## 2021-05-13 ENCOUNTER — Ambulatory Visit: Payer: Medicare Other | Admitting: Nurse Practitioner

## 2021-05-13 ENCOUNTER — Other Ambulatory Visit: Payer: Self-pay

## 2021-05-13 VITALS — BP 120/80 | HR 45 | Temp 97.5°F | Ht 73.0 in | Wt 211.0 lb

## 2021-05-13 DIAGNOSIS — M7662 Achilles tendinitis, left leg: Secondary | ICD-10-CM | POA: Diagnosis not present

## 2021-05-13 NOTE — Progress Notes (Signed)
Careteam: Patient Care Team: Lauree Chandler, NP as PCP - General (Geriatric Medicine) Kate Sable, MD as PCP - Cardiology (Cardiology)  Advanced Directive information    Allergies  Allergen Reactions   Bee Pollen Anaphylaxis    Bee Venom   Bee Venom     Throat/mouth swelling    Chief Complaint  Patient presents with   Acute Visit    Patient complains of stressed achiles tendon for about a month. Patient would like to know about getting a boot. Patient atates that it seems to be healing up some. Patient states it is painful. Patient states that pain level is 4.     HPI: Patient is a 75 y.o. male seen in today at the [facility] for painful achilles tendon.  Has a hx of tendonitis and unsure how he stressed it but has happened before and now having similar pain. When he had episode before he had a boot that held foot in a certain position that allowed it to heal.  Reports it is healing now but feels it when he walks.  Been bothering him this time for 1-2 months. Went to [place] and walked around several parks and that exacerbated the pain.  He bought some diclofenac to ease the pain and it worked but did not resolve.   Review of Systems:  Review of Systems  Musculoskeletal:  Positive for myalgias. Negative for back pain, joint pain and neck pain.       Left heel pain  Neurological:  Negative for weakness.   Past Medical History:  Diagnosis Date   Arthritis    Knees   COPD (chronic obstructive pulmonary disease) ([place])    Does use hearing aid    Bilateral   Enlarged prostate    GERD (gastroesophageal reflux disease)    Hypercholesterolemia    Past Surgical History:  Procedure Laterality Date   CHEEK AUGMENTATION Left 1970   REstructure of cheek bone    COLONOSCOPY  2017   Dr. Madolyn Frieze   ESOPHAGOGASTRODUODENOSCOPY (EGD) WITH PROPOFOL N/A 12/05/2020   Procedure: ESOPHAGOGASTRODUODENOSCOPY (EGD) WITH PROPOFOL;  Surgeon: [name], MD;  Location: [facility]  ENDOSCOPY;  Service: Endoscopy;  Laterality: N/A;   EYE SURGERY Bilateral 2005   NASAL SEPTOPLASTY W/ TURBINOPLASTY Bilateral 12/24/2020   Procedure: NASAL SEPTOPLASTY WITH INFERIOR TURBINATE REDUCTION;  Surgeon: Carloyn Manner, MD;  Location: [facility] ORS;  Service: ENT;  Laterality: Bilateral;   UPPER GI ENDOSCOPY     Social History:   reports that he quit smoking about 42 years ago. His smoking use included cigarettes. He has a 35.00 pack-year smoking history. He has never used smokeless tobacco. He reports that he does not currently use alcohol. He reports that he does not use drugs.  Family History  Problem Relation Age of Onset   Alzheimer's disease Mother    Diabetes Mother    Heart failure Father    Heart attack Father    Diabetes Father    Dementia Father     Medications: Patient's Medications  New Prescriptions   No medications on file  Previous Medications   ACETAMINOPHEN (TYLENOL) 500 MG TABLET    Take 500 mg by mouth as needed.   APOAEQUORIN (PREVAGEN) 10 MG CAPS    Take 1 capsule by mouth daily.    ASCORBIC ACID (VITAMIN C) 1000 MG TABLET    Take 1,000 mg by mouth daily.   CETAPHIL (CETAPHIL) CREAM    Apply topically as needed.   CLOTRIMAZOLE-BETAMETHASONE (  LOTRISONE) CREAM    APPLY  CREAM TOPICALLY TWICE DAILY   CYCLOBENZAPRINE (FLEXERIL) 10 MG TABLET    Take one tablet by mouth twice daily as needed for muscle spasms.   FAMOTIDINE (PEPCID) 20 MG TABLET    Take 20 mg by mouth.   FEXOFENADINE (ALLEGRA) 180 MG TABLET    Take 180 mg by mouth daily.   FINASTERIDE (PROSCAR) 5 MG TABLET    TAKE 1 TABLET BY MOUTH  DAILY   FLUTICASONE (FLONASE) 50 MCG/ACT NASAL SPRAY    USE 1 SPRAY IN EACH NOSTRIL EVERY DAY   MELATONIN 5 MG TABS    Take 5 mg by mouth at bedtime.    METHYLSULFONYLMETHANE (MSM) 1000 MG CAPS    Take 1 capsule by mouth 2 (two) times daily.   NABUMETONE (RELAFEN) 750 MG TABLET    TAKE 1 TABLET BY MOUTH  TWICE DAILY AS NEEDED   PANTOPRAZOLE (PROTONIX) 40 MG TABLET     Take 1 tablet (40 mg total) by mouth daily.   SELENIUM 200 MCG TABS TABLET    Take by mouth daily.   SIMVASTATIN (ZOCOR) 40 MG TABLET    TAKE 1 TABLET BY MOUTH  DAILY   TAMSULOSIN (FLOMAX) 0.4 MG CAPS CAPSULE    TAKE 1 CAPSULE BY MOUTH  DAILY   TEMAZEPAM (RESTORIL) 15 MG CAPSULE    Take 1 capsule (15 mg total) by mouth at bedtime as needed for sleep.   TESTOSTERONE 2 MG/24HR PT24    Apply 1 patch daily   TIOTROPIUM BROMIDE MONOHYDRATE (SPIRIVA RESPIMAT) 2.5 MCG/ACT AERS    Inhale 2 puffs into the lungs daily.   TRIAMCINOLONE CREAM (KENALOG) 0.1 %    APPLY  CREAM EXTERNALLY TO AFFECTED AREA TWICE DAILY   VITAMIN B-12 (CYANOCOBALAMIN) 500 MCG TABLET    Take 500 mcg by mouth daily.  Modified Medications   No medications on file  Discontinued Medications   No medications on file    Physical Exam:  Vitals:   05/13/21 1131  BP: 120/80  Pulse: (!) 45  Temp: (!) 97.5 F (36.4 C)  TempSrc: Oral  SpO2: 97%  Weight: 211 lb (95.7 kg)  Height: 6\' 1"  (1.854 m)   Body mass index is 27.84 kg/m. Wt Readings from Last 3 Encounters:  05/13/21 211 lb (95.7 kg)  04/03/21 207 lb (93.9 kg)  01/21/21 209 lb (94.8 kg)    Physical Exam Musculoskeletal:        General: No tenderness.     Right lower leg: No edema.     Left lower leg: No tenderness or bony tenderness. No edema.     Left ankle: No swelling, deformity, ecchymosis or lacerations. Normal range of motion.     Left Achilles Tendon: No tenderness or defects. Thompson's test negative.    Labs reviewed: Basic Metabolic Panel: Recent Labs    05/15/20 0000 01/20/21 0000  NA 137 139  K 4.7 1.7*  CL 105 106  CO2 29* 26*  BUN 16 22*  CREATININE 0.9 0.9  CALCIUM 10.0 9.8   Liver Function Tests: Recent Labs    05/15/20 0000 01/20/21 0000  AST 29 19  ALT 34 19  ALKPHOS 64 57  ALBUMIN 4.6 4.4   No results for input(s): LIPASE, AMYLASE in the last 8760 hours. No results for input(s): AMMONIA in the last 8760  hours. CBC: Recent Labs    05/15/20 0000 01/20/21 0000  WBC 5.8 5.3  NEUTROABS 3.05 2,348.00  HGB 15.6  15.4  HCT 44 44  PLT 127* 121*   Lipid Panel: Recent Labs    05/15/20 0000 01/20/21 0000  CHOL 165 171  HDL 40 44  LDLCALC 83 104  TRIG 210* 130   TSH: No results for input(s): TSH in the last 8760 hours. A1C: No results found for: HGBA1C   Assessment/Plan 1. Achilles tendinitis of left lower extremity No tenderness on exam but increase pain with walking -educated on proper foot wear. -to ice TID -to use aleve BID for 5 days (with food) -he will find support boot to help with healing- used prior and had great results.  - For home use only DME Other see comment -to follow up for worsening of symptoms or if unable to find boot   Next appt: 07/22/2021, sooner if needed Janett Billow K. [place], [facility] [phone]

## 2021-05-13 NOTE — Patient Instructions (Addendum)
To use ice three times daily ~20 mins Take aleve 1 tablet by mouth twice daily with food for 5 days  Look online or medical supply for boot

## 2021-05-18 ENCOUNTER — Other Ambulatory Visit: Payer: Self-pay | Admitting: Nurse Practitioner

## 2021-05-18 DIAGNOSIS — F5101 Primary insomnia: Secondary | ICD-10-CM

## 2021-05-19 NOTE — Telephone Encounter (Signed)
RX last refilled on 09/26/2020 # 30 with 3 refills.   No treatment agreement on file,  notation made on 07/22/2021 appointment to sign treatment agreement

## 2021-05-21 ENCOUNTER — Ambulatory Visit: Payer: Medicare Other | Admitting: Pulmonary Disease

## 2021-05-27 DIAGNOSIS — M170 Bilateral primary osteoarthritis of knee: Secondary | ICD-10-CM | POA: Diagnosis not present

## 2021-06-02 DIAGNOSIS — M170 Bilateral primary osteoarthritis of knee: Secondary | ICD-10-CM | POA: Insufficient documentation

## 2021-06-03 DIAGNOSIS — M170 Bilateral primary osteoarthritis of knee: Secondary | ICD-10-CM | POA: Diagnosis not present

## 2021-06-12 DIAGNOSIS — M170 Bilateral primary osteoarthritis of knee: Secondary | ICD-10-CM | POA: Diagnosis not present

## 2021-07-04 ENCOUNTER — Telehealth: Payer: Self-pay | Admitting: Nurse Practitioner

## 2021-07-04 NOTE — Telephone Encounter (Signed)
Called, left a vm for pt to call back and schedule next AWV.

## 2021-07-15 ENCOUNTER — Other Ambulatory Visit: Payer: Self-pay

## 2021-07-15 ENCOUNTER — Ambulatory Visit: Payer: Medicare Other | Admitting: Nurse Practitioner

## 2021-07-15 ENCOUNTER — Encounter: Payer: Self-pay | Admitting: Nurse Practitioner

## 2021-07-15 VITALS — BP 130/80 | HR 72 | Temp 98.0°F | Ht 73.0 in | Wt 209.0 lb

## 2021-07-15 DIAGNOSIS — L989 Disorder of the skin and subcutaneous tissue, unspecified: Secondary | ICD-10-CM

## 2021-07-15 NOTE — Progress Notes (Signed)
Careteam: Patient Care Team: Lauree Chandler, NP as PCP - General (Geriatric Medicine) Kate Sable, MD as PCP - Cardiology (Cardiology)  Advanced Directive information    Allergies  Allergen Reactions   Bee Pollen Anaphylaxis    Bee Venom   Bee Venom     Throat/mouth swelling    Chief Complaint  Patient presents with   Acute Visit    Patient would like referral to dermatologist. Patient has area on right jaw he would like to have checked. Area is not painful.      HPI: Patient is a 76 y.o. male seen in today at the [facility] for place on face.  Small bump on left cheek that has been been there or 1-2 month.  When he shaves he notices it. He does scrapes area.  Not painful.     Review of Systems:  Review of Systems  Constitutional:  Negative for chills and fever.  Skin:  Negative for itching and rash.       Small raised area noted to right cheek    Past Medical History:  Diagnosis Date   Arthritis    Knees   COPD (chronic obstructive pulmonary disease) ([place])    Does use hearing aid    Bilateral   Enlarged prostate    GERD (gastroesophageal reflux disease)    Hypercholesterolemia    Past Surgical History:  Procedure Laterality Date   CHEEK AUGMENTATION Left 1970   REstructure of cheek bone    COLONOSCOPY  2017   Dr. Madolyn Frieze   ESOPHAGOGASTRODUODENOSCOPY (EGD) WITH PROPOFOL N/A 12/05/2020   Procedure: ESOPHAGOGASTRODUODENOSCOPY (EGD) WITH PROPOFOL;  Surgeon: [name], MD;  Location: [facility] ENDOSCOPY;  Service: Endoscopy;  Laterality: N/A;   EYE SURGERY Bilateral 2005   NASAL SEPTOPLASTY W/ TURBINOPLASTY Bilateral 12/24/2020   Procedure: NASAL SEPTOPLASTY WITH INFERIOR TURBINATE REDUCTION;  Surgeon: Carloyn Manner, MD;  Location: [facility] ORS;  Service: ENT;  Laterality: Bilateral;   UPPER GI ENDOSCOPY     Social History:   reports that he quit smoking about 43 years ago. His smoking use included cigarettes. He has a 35.00 pack-year smoking  history. He has never used smokeless tobacco. He reports that he does not currently use alcohol. He reports that he does not use drugs.  Family History  Problem Relation Age of Onset   Alzheimer's disease Mother    Diabetes Mother    Heart failure Father    Heart attack Father    Diabetes Father    Dementia Father     Medications: Patient's Medications  New Prescriptions   No medications on file  Previous Medications   ACETAMINOPHEN (TYLENOL) 500 MG TABLET    Take 500 mg by mouth as needed.   APOAEQUORIN (PREVAGEN) 10 MG CAPS    Take 1 capsule by mouth daily.    ASCORBIC ACID (VITAMIN C) 1000 MG TABLET    Take 1,000 mg by mouth daily.   CETAPHIL (CETAPHIL) CREAM    Apply topically as needed.   CHOLECALCIFEROL (VITAMIN D3) 125 MCG (5000 UT) CAPS    Take by mouth.   CLOTRIMAZOLE-BETAMETHASONE (LOTRISONE) CREAM    APPLY  CREAM TOPICALLY TWICE DAILY   CYCLOBENZAPRINE (FLEXERIL) 10 MG TABLET    Take one tablet by mouth twice daily as needed for muscle spasms.   FAMOTIDINE (PEPCID) 20 MG TABLET    Take 20 mg by mouth.   FEXOFENADINE (ALLEGRA) 180 MG TABLET    Take 180 mg by mouth daily  as needed.   FINASTERIDE (PROSCAR) 5 MG TABLET    TAKE 1 TABLET BY MOUTH  DAILY   FLUTICASONE (FLONASE) 50 MCG/ACT NASAL SPRAY    USE 1 SPRAY IN EACH NOSTRIL EVERY DAY   MELATONIN 5 MG TABS    Take 5 mg by mouth at bedtime.    METHYLSULFONYLMETHANE (MSM) 1000 MG CAPS    Take 1 capsule by mouth 2 (two) times daily.   NABUMETONE (RELAFEN) 750 MG TABLET    TAKE 1 TABLET BY MOUTH  TWICE DAILY AS NEEDED   PANTOPRAZOLE (PROTONIX) 40 MG TABLET    Take 1 tablet (40 mg total) by mouth daily.   SELENIUM 200 MCG TABS TABLET    Take by mouth daily.   SIMVASTATIN (ZOCOR) 40 MG TABLET    TAKE 1 TABLET BY MOUTH  DAILY   TAMSULOSIN (FLOMAX) 0.4 MG CAPS CAPSULE    TAKE 1 CAPSULE BY MOUTH  DAILY   TEMAZEPAM (RESTORIL) 15 MG CAPSULE    TAKE 1 CAPSULE BY MOUTH AT  BEDTIME AS NEEDED FOR SLEEP   TESTOSTERONE 2 MG/24HR PT24     Apply 1 patch daily   TIOTROPIUM BROMIDE MONOHYDRATE (SPIRIVA RESPIMAT) 2.5 MCG/ACT AERS    Inhale 2 puffs into the lungs daily.   TRIAMCINOLONE CREAM (KENALOG) 0.1 %    APPLY  CREAM EXTERNALLY TO AFFECTED AREA TWICE DAILY   VITAMIN B-12 (CYANOCOBALAMIN) 500 MCG TABLET    Take 500 mcg by mouth daily.  Modified Medications   No medications on file  Discontinued Medications   No medications on file    Physical Exam:  Vitals:   07/15/21 1323  BP: 130/80  Pulse: 72  Temp: 98 F (36.7 C)  TempSrc: Oral  SpO2: 94%  Weight: 209 lb (94.8 kg)  Height: 6\' 1"  (1.854 m)   Body mass index is 27.57 kg/m. Wt Readings from Last 3 Encounters:  07/15/21 209 lb (94.8 kg)  05/13/21 211 lb (95.7 kg)  04/03/21 207 lb (93.9 kg)    Physical Exam Constitutional:      Appearance: Normal appearance.  Skin:    General: Skin is warm and dry.     Findings: Lesion (round <1cm papule to right cheek that is skin colored/slightly pink) present.  Neurological:     Mental Status: He is alert.    Labs reviewed: Basic Metabolic Panel: Recent Labs    01/20/21 0000  NA 139  K 1.7*  CL 106  CO2 26*  BUN 22*  CREATININE 0.9  CALCIUM 9.8   Liver Function Tests: Recent Labs    01/20/21 0000  AST 19  ALT 19  ALKPHOS 57  ALBUMIN 4.4   No results for input(s): LIPASE, AMYLASE in the last 8760 hours. No results for input(s): AMMONIA in the last 8760 hours. CBC: Recent Labs    01/20/21 0000  WBC 5.3  NEUTROABS 2,348.00  HGB 15.4  HCT 44  PLT 121*   Lipid Panel: Recent Labs    01/20/21 0000  CHOL 171  HDL 44  LDLCALC 104  TRIG 130   TSH: No results for input(s): TSH in the last 8760 hours. A1C: No results found for: HGBA1C   Assessment/Plan 1. Lesion of face - Ambulatory referral to Dermatology- for further evaluation. Carlos American. [place], [facility] [phone]

## 2021-07-17 ENCOUNTER — Telehealth: Payer: Self-pay | Admitting: *Deleted

## 2021-07-17 DIAGNOSIS — E782 Mixed hyperlipidemia: Secondary | ICD-10-CM | POA: Diagnosis not present

## 2021-07-17 DIAGNOSIS — R7989 Other specified abnormal findings of blood chemistry: Secondary | ICD-10-CM | POA: Diagnosis not present

## 2021-07-17 DIAGNOSIS — I10 Essential (primary) hypertension: Secondary | ICD-10-CM | POA: Diagnosis not present

## 2021-07-17 NOTE — Telephone Encounter (Signed)
Patient came by clinic at [facility] and requested lab be added. I called [facility] and lab was added on for PSA.

## 2021-07-17 NOTE — Telephone Encounter (Signed)
Patient called and stated that he had labs at [facility] this morning and he wanted to make sure a PSA was drawn.   Stated that he has a Family history of Prostate issues and wants it checked.

## 2021-07-19 LAB — HEPATIC FUNCTION PANEL
ALT: 21 (ref 10–40)
AST: 20 (ref 14–40)
Alkaline Phosphatase: 66 (ref 25–125)
Bilirubin, Total: 1.1

## 2021-07-19 LAB — CBC AND DIFFERENTIAL
HCT: 48 (ref 41–53)
Hemoglobin: 16.6 (ref 13.5–17.5)
Neutrophils Absolute: 2829
Platelets: 128 — AB (ref 150–399)
WBC: 6.4

## 2021-07-19 LAB — BASIC METABOLIC PANEL
BUN: 24 — AB (ref 4–21)
CO2: 29 — AB (ref 13–22)
Chloride: 105 (ref 99–108)
Creatinine: 0.9 (ref 0.6–1.3)
Glucose: 90
Potassium: 4.8 (ref 3.4–5.3)
Sodium: 141 (ref 137–147)

## 2021-07-19 LAB — PSA: PSA: 0.24

## 2021-07-19 LAB — LIPID PANEL
Cholesterol: 197 (ref 0–200)
HDL: 36 (ref 35–70)
LDL Cholesterol: 124
LDl/HDL Ratio: 5.5
Triglycerides: 229 — AB (ref 40–160)

## 2021-07-19 LAB — COMPREHENSIVE METABOLIC PANEL
Albumin: 4.5 (ref 3.5–5.0)
Calcium: 10.2 (ref 8.7–10.7)
Globulin: 2.6

## 2021-07-19 LAB — CBC: RBC: 5.14 — AB (ref 3.87–5.11)

## 2021-07-21 ENCOUNTER — Other Ambulatory Visit: Payer: Self-pay

## 2021-07-22 ENCOUNTER — Encounter: Payer: Self-pay | Admitting: Nurse Practitioner

## 2021-07-22 ENCOUNTER — Other Ambulatory Visit: Payer: Self-pay

## 2021-07-22 ENCOUNTER — Ambulatory Visit: Payer: Medicare Other | Admitting: Nurse Practitioner

## 2021-07-22 VITALS — BP 150/90 | HR 60 | Temp 97.4°F | Ht 73.0 in | Wt 211.0 lb

## 2021-07-22 DIAGNOSIS — E782 Mixed hyperlipidemia: Secondary | ICD-10-CM

## 2021-07-22 DIAGNOSIS — E349 Endocrine disorder, unspecified: Secondary | ICD-10-CM

## 2021-07-22 DIAGNOSIS — N401 Enlarged prostate with lower urinary tract symptoms: Secondary | ICD-10-CM | POA: Diagnosis not present

## 2021-07-22 DIAGNOSIS — G4733 Obstructive sleep apnea (adult) (pediatric): Secondary | ICD-10-CM

## 2021-07-22 DIAGNOSIS — D696 Thrombocytopenia, unspecified: Secondary | ICD-10-CM

## 2021-07-22 DIAGNOSIS — R351 Nocturia: Secondary | ICD-10-CM

## 2021-07-22 DIAGNOSIS — I10 Essential (primary) hypertension: Secondary | ICD-10-CM | POA: Diagnosis not present

## 2021-07-22 DIAGNOSIS — J449 Chronic obstructive pulmonary disease, unspecified: Secondary | ICD-10-CM | POA: Diagnosis not present

## 2021-07-22 NOTE — Progress Notes (Signed)
Careteam: Patient Care Team: Lauree Chandler, NP as PCP - General (Geriatric Medicine) Kate Sable, MD as PCP - Cardiology (Cardiology)  Advanced Directive information Does Patient Have a Medical Advance Directive?: Yes, Type of Advance Directive: Out of facility DNR (pink MOST or yellow form);Living will, Pre-existing out of facility DNR order (yellow form or pink MOST form): Pink MOST form placed in chart (order not valid for inpatient use), Does patient want to make changes to medical advance directive?: No - Patient declined  Allergies  Allergen Reactions   Bee Pollen Anaphylaxis    Bee Venom   Bee Venom     Throat/mouth swelling    Chief Complaint  Patient presents with   Medical Management of Chronic Issues    6 month follow up.   Health Maintenance   Immunizations    Zoste vaccine, 3rd COVID booster, Flu vaccine, colonoscopy.     HPI: Patient is a 76 y.o. male seen in today at the [facility] for routine follow up  Hyperlipidemia- elevated LDL and triglycerides. Poor diet noted. Continues on simvastatin 40 mg daily- not missing doses.  Blood pressure   Elevated blood pressure reading today but generally well controlled. Goes up at doctor visits occasionally  Has dermatology appt scheduled for April.   Low testosterone- continues to use patches, does not use daily but will use 4-5 days monthly.  PSA stable.   COPD- continues on spiriva, uses albuterol Rarely. Continues to have a cough but this has improved. Congestion is better.   GERD-controlled on protonix.   Bph-  continues to urinate at bedtime. On proscar and flomax. Getting up 3 times at night. Wonders if more can be done. Went to urologist in the past.    Osa- not on cpap, CPAP was recalled, he has had nasal septoplasty and was hoping that would correct the problem. He does not like using CPAP.     Review of Systems:  Review of Systems  Constitutional:  Negative for chills, fever  and weight loss.  HENT:  Negative for tinnitus.   Respiratory:  Positive for cough. Negative for sputum production and shortness of breath.   Cardiovascular:  Negative for chest pain, palpitations and leg swelling.  Gastrointestinal:  Negative for abdominal pain, constipation, diarrhea and heartburn.  Genitourinary:  Positive for frequency. Negative for dysuria and urgency.  Musculoskeletal:  Negative for back pain, falls, joint pain and myalgias.  Skin: Negative.   Neurological:  Negative for dizziness and headaches.  Psychiatric/Behavioral:  Negative for depression and memory loss. The patient does not have insomnia.    Past Medical History:  Diagnosis Date   Arthritis    Knees   COPD (chronic obstructive pulmonary disease) ([place])    Does use hearing aid    Bilateral   Enlarged prostate    GERD (gastroesophageal reflux disease)    Hypercholesterolemia    Past Surgical History:  Procedure Laterality Date   CHEEK AUGMENTATION Left 1970   REstructure of cheek bone    COLONOSCOPY  2017   Dr. Madolyn Frieze   ESOPHAGOGASTRODUODENOSCOPY (EGD) WITH PROPOFOL N/A 12/05/2020   Procedure: ESOPHAGOGASTRODUODENOSCOPY (EGD) WITH PROPOFOL;  Surgeon: [name], MD;  Location: [facility] ENDOSCOPY;  Service: Endoscopy;  Laterality: N/A;   EYE SURGERY Bilateral 2005   NASAL SEPTOPLASTY W/ TURBINOPLASTY Bilateral 12/24/2020   Procedure: NASAL SEPTOPLASTY WITH INFERIOR TURBINATE REDUCTION;  Surgeon: Carloyn Manner, MD;  Location: [facility] ORS;  Service: ENT;  Laterality: Bilateral;   UPPER GI ENDOSCOPY  Social History:   reports that he quit smoking about 43 years ago. His smoking use included cigarettes. He has a 35.00 pack-year smoking history. He has never used smokeless tobacco. He reports that he does not currently use alcohol. He reports that he does not use drugs.  Family History  Problem Relation Age of Onset   Alzheimer's disease Mother    Diabetes Mother    Heart failure Father    Heart attack  Father    Diabetes Father    Dementia Father     Medications: Patient's Medications  New Prescriptions   No medications on file  Previous Medications   ACETAMINOPHEN (TYLENOL) 500 MG TABLET    Take 500 mg by mouth as needed.   APOAEQUORIN (PREVAGEN) 10 MG CAPS    Take 1 capsule by mouth daily.    ASCORBIC ACID (VITAMIN C) 1000 MG TABLET    Take 1,000 mg by mouth daily.   CETAPHIL (CETAPHIL) CREAM    Apply topically as needed.   CHOLECALCIFEROL (VITAMIN D3) 125 MCG (5000 UT) CAPS    Take by mouth.   CLOTRIMAZOLE-BETAMETHASONE (LOTRISONE) CREAM    APPLY  CREAM TOPICALLY TWICE DAILY   CYCLOBENZAPRINE (FLEXERIL) 10 MG TABLET    Take one tablet by mouth twice daily as needed for muscle spasms.   FAMOTIDINE (PEPCID) 20 MG TABLET    Take 20 mg by mouth.   FEXOFENADINE (ALLEGRA) 180 MG TABLET    Take 180 mg by mouth daily as needed.   FINASTERIDE (PROSCAR) 5 MG TABLET    TAKE 1 TABLET BY MOUTH  DAILY   FLUTICASONE (FLONASE) 50 MCG/ACT NASAL SPRAY    USE 1 SPRAY IN EACH NOSTRIL EVERY DAY   MELATONIN 5 MG TABS    Take 5 mg by mouth at bedtime.    METHYLSULFONYLMETHANE (MSM) 1000 MG CAPS    Take 1 capsule by mouth 2 (two) times daily.   NABUMETONE (RELAFEN) 750 MG TABLET    TAKE 1 TABLET BY MOUTH  TWICE DAILY AS NEEDED   PANTOPRAZOLE (PROTONIX) 40 MG TABLET    Take 1 tablet (40 mg total) by mouth daily.   SELENIUM 200 MCG TABS TABLET    Take by mouth daily.   SIMVASTATIN (ZOCOR) 40 MG TABLET    TAKE 1 TABLET BY MOUTH  DAILY   TAMSULOSIN (FLOMAX) 0.4 MG CAPS CAPSULE    TAKE 1 CAPSULE BY MOUTH  DAILY   TEMAZEPAM (RESTORIL) 15 MG CAPSULE    TAKE 1 CAPSULE BY MOUTH AT  BEDTIME AS NEEDED FOR SLEEP   TESTOSTERONE 2 MG/24HR PT24    Apply 1 patch daily   TIOTROPIUM BROMIDE MONOHYDRATE (SPIRIVA RESPIMAT) 2.5 MCG/ACT AERS    Inhale 2 puffs into the lungs daily.   TRIAMCINOLONE CREAM (KENALOG) 0.1 %    APPLY  CREAM EXTERNALLY TO AFFECTED AREA TWICE DAILY   VITAMIN B-12 (CYANOCOBALAMIN) 500 MCG TABLET     Take 500 mcg by mouth daily.  Modified Medications   No medications on file  Discontinued Medications   No medications on file    Physical Exam:  Vitals:   07/22/21 1044  BP: (!) 150/90  Pulse: 60  Temp: (!) 97.4 F (36.3 C)  SpO2: 93%  Weight: 211 lb (95.7 kg)  Height: 6\' 1"  (1.854 m)   Body mass index is 27.84 kg/m. Wt Readings from Last 3 Encounters:  07/22/21 211 lb (95.7 kg)  07/15/21 209 lb (94.8 kg)  05/13/21 211 lb (95.7 kg)  Physical Exam Constitutional:      General: He is not in acute distress.    Appearance: He is well-developed. He is not diaphoretic.  HENT:     Head: Normocephalic and atraumatic.     Right Ear: External ear normal.     Left Ear: External ear normal.     Mouth/Throat:     Pharynx: No oropharyngeal exudate.  Eyes:     Conjunctiva/sclera: Conjunctivae normal.     Pupils: Pupils are equal, round, and reactive to light.  Cardiovascular:     Rate and Rhythm: Normal rate and regular rhythm.     Heart sounds: Normal heart sounds.  Pulmonary:     Effort: Pulmonary effort is normal.     Breath sounds: Normal breath sounds.  Abdominal:     General: Bowel sounds are normal.     Palpations: Abdomen is soft.  Musculoskeletal:        General: No tenderness.     Cervical back: Normal range of motion and neck supple.     Right lower leg: No edema.     Left lower leg: No edema.  Skin:    General: Skin is warm and dry.  Neurological:     Mental Status: He is alert and oriented to person, place, and time. Mental status is at baseline.     Gait: Gait normal.    Labs reviewed: Basic Metabolic Panel: Recent Labs    01/20/21 0000  NA 139  K 1.7*  CL 106  CO2 26*  BUN 22*  CREATININE 0.9  CALCIUM 9.8   Liver Function Tests: Recent Labs    01/20/21 0000  AST 19  ALT 19  ALKPHOS 57  ALBUMIN 4.4   No results for input(s): LIPASE, AMYLASE in the last 8760 hours. No results for input(s): AMMONIA in the last 8760  hours. CBC: Recent Labs    01/20/21 0000  WBC 5.3  NEUTROABS 2,348.00  HGB 15.4  HCT 44  PLT 121*   Lipid Panel: Recent Labs    01/20/21 0000  CHOL 171  HDL 44  LDLCALC 104  TRIG 130   TSH: No results for input(s): TSH in the last 8760 hours. A1C: No results found for: HGBA1C   Assessment/Plan  1. BPH associated with nocturia -on flomax and proscar with ongoing symptoms.  - Ambulatory referral to Urology  2. Testosterone deficiency -stable PSA in normal range, would like this to be checked every 6 months due to family hx of prostate cancer.   3. Essential hypertension -elevated today, dietary modifications given and encouraged. He will monitor at home and notify if staying elevated. Goal <140/90.  4. OSA (obstructive sleep apnea) Not currently uses CPAP, discussed importance of compliance and correlation between nocturia and untreated OA. He plans to get new machine to start using again  5. Thrombocytopenia ([place]) Stable on labs. No abnormal bruising or bleeding.  6. Chronic obstructive pulmonary disease, unspecified COPD type ([place]) -stable on Spiriva   7. Mixed hyperlipidemia Elevated at this time, continues on zocor, discussed importance of dietary modifications in addition to statin.   Next appt: 6 months, labs prior to visit, (PSA, cbc, cmp, lipid) Tucker Steedley K. [place], [facility] [phone]

## 2021-07-22 NOTE — Patient Instructions (Addendum)
Monitor BP at home. Goal LESS THAN 140/90. Please let me know if it is staying over this on 3 different days.   Check into CPAP and get new machine to see if that helps urinary symptoms.   Urology referral has been placed.

## 2021-07-31 ENCOUNTER — Telehealth: Payer: Self-pay

## 2021-07-31 NOTE — Telephone Encounter (Signed)
Called and left message for patient to come pick up information that the dropped off at the [facility] clinic as it was for him personally. Didn't know if there was anything in particular he wanted Sherrie Mustache, NP to get from the

## 2021-08-14 ENCOUNTER — Ambulatory Visit: Payer: Self-pay | Admitting: Urology

## 2021-08-26 ENCOUNTER — Other Ambulatory Visit: Payer: Self-pay

## 2021-08-26 ENCOUNTER — Ambulatory Visit: Payer: Medicare Other | Admitting: Nurse Practitioner

## 2021-08-26 ENCOUNTER — Encounter: Payer: Self-pay | Admitting: Nurse Practitioner

## 2021-08-26 VITALS — BP 140/90 | HR 71 | Temp 97.7°F | Ht 73.0 in | Wt 213.0 lb

## 2021-08-26 DIAGNOSIS — J40 Bronchitis, not specified as acute or chronic: Secondary | ICD-10-CM | POA: Diagnosis not present

## 2021-08-26 MED ORDER — DOXYCYCLINE HYCLATE 100 MG PO TABS: 100.0000 mg | ORAL_TABLET | Freq: Two times a day (BID) | ORAL | 0 refills | Status: DC

## 2021-08-26 NOTE — Progress Notes (Signed)
Careteam: Patient Care Team: Lauree Chandler, NP as PCP - General (Geriatric Medicine) Kate Sable, MD as PCP - Cardiology (Cardiology)  Advanced Directive information    Allergies  Allergen Reactions   Bee Pollen Anaphylaxis    Bee Venom   Bee Venom     Throat/mouth swelling    Chief Complaint  Patient presents with   Acute Visit    Patient complains of having a URI. Patient has been having symptoms for about 3 weeks. Patient cough, chest congestion and lots of mucous. No fever. Mucous had grayish and green color to it. Patient had slight headache at times. Patient has been using cough syrup and vitamin c. Patient took COVID test at onset of symptoms and it was negative.     HPI: Patient is a 76 y.o. male seen in today at the [facility] due to URI.  Has been having symptoms for 3 weeks  COVID negative.  States he was feeling better than started getting worse.  Mostly in the chest.  He is taking some over the counter medication and vit c.  Coughing, productive sputum.  No fever or chills.  Having some shortness of breath, uses inhalers which helps.   Review of Systems:  Review of Systems  Constitutional:  Negative for chills, fever and weight loss.  HENT:  Positive for congestion. Negative for sinus pain and tinnitus.   Respiratory:  Positive for cough and sputum production. Negative for shortness of breath and wheezing.   Cardiovascular:  Negative for chest pain, palpitations and leg swelling.  Gastrointestinal:  Negative for abdominal pain, constipation, diarrhea and heartburn.  Genitourinary:  Negative for dysuria, frequency and urgency.  Musculoskeletal:  Negative for back pain, falls, joint pain and myalgias.  Skin: Negative.   Neurological:  Negative for dizziness and headaches.   Past Medical History:  Diagnosis Date   Arthritis    Knees   COPD (chronic obstructive pulmonary disease) ([place])    Does use hearing aid    Bilateral   Enlarged  prostate    GERD (gastroesophageal reflux disease)    Hypercholesterolemia    Past Surgical History:  Procedure Laterality Date   CHEEK AUGMENTATION Left 1970   REstructure of cheek bone    COLONOSCOPY  2017   Dr. Madolyn Frieze   ESOPHAGOGASTRODUODENOSCOPY (EGD) WITH PROPOFOL N/A 12/05/2020   Procedure: ESOPHAGOGASTRODUODENOSCOPY (EGD) WITH PROPOFOL;  Surgeon: [name], MD;  Location: [facility] ENDOSCOPY;  Service: Endoscopy;  Laterality: N/A;   EYE SURGERY Bilateral 2005   NASAL SEPTOPLASTY W/ TURBINOPLASTY Bilateral 12/24/2020   Procedure: NASAL SEPTOPLASTY WITH INFERIOR TURBINATE REDUCTION;  Surgeon: Carloyn Manner, MD;  Location: [facility] ORS;  Service: ENT;  Laterality: Bilateral;   UPPER GI ENDOSCOPY     Social History:   reports that he quit smoking about 43 years ago. His smoking use included cigarettes. He has a 35.00 pack-year smoking history. He has never used smokeless tobacco. He reports that he does not currently use alcohol. He reports that he does not use drugs.  Family History  Problem Relation Age of Onset   Alzheimer's disease Mother    Diabetes Mother    Heart failure Father    Heart attack Father    Diabetes Father    Dementia Father     Medications: Patient's Medications  New Prescriptions   No medications on file  Previous Medications   ACETAMINOPHEN (TYLENOL) 500 MG TABLET    Take 500 mg by mouth as needed.  APOAEQUORIN (PREVAGEN) 10 MG CAPS    Take 1 capsule by mouth daily.    ASCORBIC ACID (VITAMIN C) 1000 MG TABLET    Take 1,000 mg by mouth daily.   CETAPHIL (CETAPHIL) CREAM    Apply topically as needed.   CHOLECALCIFEROL (VITAMIN D3) 125 MCG (5000 UT) CAPS    Take by mouth.   CLOTRIMAZOLE-BETAMETHASONE (LOTRISONE) CREAM    APPLY  CREAM TOPICALLY TWICE DAILY   CYCLOBENZAPRINE (FLEXERIL) 10 MG TABLET    Take one tablet by mouth twice daily as needed for muscle spasms.   FAMOTIDINE (PEPCID) 20 MG TABLET    Take 20 mg by mouth.   FEXOFENADINE (ALLEGRA) 180 MG  TABLET    Take 180 mg by mouth daily as needed.   FINASTERIDE (PROSCAR) 5 MG TABLET    TAKE 1 TABLET BY MOUTH  DAILY   FLUTICASONE (FLONASE) 50 MCG/ACT NASAL SPRAY    USE 1 SPRAY IN EACH NOSTRIL EVERY DAY   MELATONIN 5 MG TABS    Take 5 mg by mouth at bedtime.    METHYLSULFONYLMETHANE (MSM) 1000 MG CAPS    Take 1 capsule by mouth 2 (two) times daily.   NABUMETONE (RELAFEN) 750 MG TABLET    TAKE 1 TABLET BY MOUTH  TWICE DAILY AS NEEDED   PANTOPRAZOLE (PROTONIX) 40 MG TABLET    Take 1 tablet (40 mg total) by mouth daily.   SELENIUM 200 MCG TABS TABLET    Take by mouth daily.   SIMVASTATIN (ZOCOR) 40 MG TABLET    TAKE 1 TABLET BY MOUTH  DAILY   TAMSULOSIN (FLOMAX) 0.4 MG CAPS CAPSULE    TAKE 1 CAPSULE BY MOUTH  DAILY   TEMAZEPAM (RESTORIL) 15 MG CAPSULE    TAKE 1 CAPSULE BY MOUTH AT  BEDTIME AS NEEDED FOR SLEEP   TESTOSTERONE 2 MG/24HR PT24    Apply 1 patch daily   TIOTROPIUM BROMIDE MONOHYDRATE (SPIRIVA RESPIMAT) 2.5 MCG/ACT AERS    Inhale 2 puffs into the lungs daily.   TRIAMCINOLONE CREAM (KENALOG) 0.1 %    APPLY  CREAM EXTERNALLY TO AFFECTED AREA TWICE DAILY   VITAMIN B-12 (CYANOCOBALAMIN) 500 MCG TABLET    Take 500 mcg by mouth daily.  Modified Medications   No medications on file  Discontinued Medications   No medications on file    Physical Exam:  Vitals:   08/26/21 1126  BP: 140/90  Pulse: 71  Temp: 97.7 F (36.5 C)  TempSrc: Oral  SpO2: 97%  Weight: 213 lb (96.6 kg)  Height: 6\' 1"  (1.854 m)   Body mass index is 28.1 kg/m. Wt Readings from Last 3 Encounters:  08/26/21 213 lb (96.6 kg)  07/22/21 211 lb (95.7 kg)  07/15/21 209 lb (94.8 kg)    Physical Exam Constitutional:      General: He is not in acute distress.    Appearance: He is well-developed. He is not diaphoretic.  HENT:     Head: Normocephalic and atraumatic.     Right Ear: Tympanic membrane, ear canal and external ear normal.     Left Ear: Tympanic membrane, ear canal and external ear normal.      Mouth/Throat:     Pharynx: No oropharyngeal exudate.  Eyes:     Conjunctiva/sclera: Conjunctivae normal.     Pupils: Pupils are equal, round, and reactive to light.  Cardiovascular:     Rate and Rhythm: Normal rate and regular rhythm.     Heart sounds: Normal heart sounds.  Pulmonary:     Effort: Pulmonary effort is normal.     Breath sounds: Normal breath sounds.  Musculoskeletal:        General: No tenderness.     Cervical back: Normal range of motion and neck supple.     Right lower leg: No edema.     Left lower leg: No edema.  Skin:    General: Skin is warm and dry.  Neurological:     Mental Status: He is alert and oriented to person, place, and time.  Psychiatric:        Mood and Affect: Mood normal.    Labs reviewed: Basic Metabolic Panel: Recent Labs    01/20/21 0000 07/19/21 0000  NA 139 141  K 1.7* 4.8  CL 106 105  CO2 26* 29*  BUN 22* 24*  CREATININE 0.9 0.9  CALCIUM 9.8 10.2   Liver Function Tests: Recent Labs    01/20/21 0000 07/19/21 0000  AST 19 20  ALT 19 21  ALKPHOS 57 66  ALBUMIN 4.4 4.5   No results for input(s): LIPASE, AMYLASE in the last 8760 hours. No results for input(s): AMMONIA in the last 8760 hours. CBC: Recent Labs    01/20/21 0000 07/19/21 0000  WBC 5.3 6.4  NEUTROABS 2,348.00 2,829.00  HGB 15.4 16.6  HCT 44 48  PLT 121* 128*   Lipid Panel: Recent Labs    01/20/21 0000 07/19/21 0000  CHOL 171 197  HDL 44 36  LDLCALC 104 124  TRIG 130 229*   TSH: No results for input(s): TSH in the last 8760 hours. A1C: No results found for: HGBA1C   Assessment/Plan 1. Bronchitis -Netipot or saline wash daily to both nares  Plain nasal saline spray throughout the day as needed May use tylenol 500 mg 2 tablets every 8 hours as needed aches and pains or sore throat humidifier in the home to help with the dry air Mucinex DM by mouth twice daily as needed for cough and chest congestion with full glass of water  Keep well  hydrated Proper nutrition  Avoid forcefully blowing nose Vit c 1000 mg daily Vit d 2000 units daily  Zinc 50 mg daily - doxycycline (VIBRA-TABS) 100 MG tablet; Take 1 tablet (100 mg total) by mouth 2 (two) times daily.  Dispense: 14 tablet; Refill: 0- to take with food -to notify if symptoms worsen or fail to improve.  Continue inhalers as prescribed, albuterol PRN  Vanda Waskey K. [place], [facility] [phone]

## 2021-08-26 NOTE — Patient Instructions (Addendum)
Netipot or saline wash daily to both nare Plain nasal saline spray throughout the day as needed for congestion May use tylenol 500 mg 2 tablets every 8 hours as needed aches and pains or sore throat humidifier in the home to help with the dry air Mucinex DM by mouth twice daily as needed for cough and chest congestion with full glass of water for 1 week  Keep well hydrated Proper nutrition  Avoid forcefully blowing nose Vit c 1000 mg daily Vit d 2000 units daily  Zinc 50 mg daily

## 2021-08-27 ENCOUNTER — Ambulatory Visit: Payer: Self-pay | Admitting: Urology

## 2021-09-10 ENCOUNTER — Ambulatory Visit: Payer: Self-pay | Admitting: Urology

## 2021-09-18 ENCOUNTER — Encounter: Payer: Self-pay | Admitting: Urology

## 2021-09-18 ENCOUNTER — Other Ambulatory Visit: Payer: Self-pay

## 2021-09-18 ENCOUNTER — Ambulatory Visit (INDEPENDENT_AMBULATORY_CARE_PROVIDER_SITE_OTHER): Payer: Medicare Other | Admitting: Urology

## 2021-09-18 ENCOUNTER — Other Ambulatory Visit: Payer: Self-pay | Admitting: Gastroenterology

## 2021-09-18 VITALS — BP 190/87 | HR 53 | Ht 73.0 in | Wt 208.9 lb

## 2021-09-18 DIAGNOSIS — R351 Nocturia: Secondary | ICD-10-CM | POA: Diagnosis not present

## 2021-09-18 DIAGNOSIS — N401 Enlarged prostate with lower urinary tract symptoms: Secondary | ICD-10-CM

## 2021-09-18 DIAGNOSIS — N3281 Overactive bladder: Secondary | ICD-10-CM | POA: Diagnosis not present

## 2021-09-18 DIAGNOSIS — Z125 Encounter for screening for malignant neoplasm of prostate: Secondary | ICD-10-CM | POA: Diagnosis not present

## 2021-09-18 LAB — BLADDER SCAN AMB NON-IMAGING

## 2021-09-18 MED ORDER — OXYBUTYNIN CHLORIDE ER 10 MG PO TB24: 10.0000 mg | ORAL_TABLET | Freq: Every day | ORAL | 11 refills | Status: DC

## 2021-09-18 NOTE — Progress Notes (Signed)
? ?  09/18/2021 ?2:41 PM  ? ?[name] ?1945/08/29 ?[id number] ? ?Reason for visit: Follow up nocturia, BPH, PSA screening ? ?HPI: ?77 year old male here for follow-up of the above issues, he has been on finasteride and Flomax long-term for BPH.  I saw him in November 2021, when he was primarily having nocturia 2-5 times per night.  DRE at that time showed a 20 g smooth prostate.  He had known sleep apnea and was noncompliant with CPAP at that time, and I strongly encouraged him to resume CPAP use.  He never resumed using CPAP, and ultimately underwent surgery with ENT in the summer 2022 for deviated septum, and he has not been reevaluated for sleep apnea since that time. ? ?He reports persistently bothersome nocturia 4 times per night, as well as some mild frequency and occasional urgency during the day.  He drinks primarily water during the day.  Urinalysis today is completely benign, and PVR is normal at 96 mL.  PSA has remained very low, most recently 0.24(corrected for finasteride 0.48) in January 2023. ? ?We discussed the relationship again at length today between sleep apnea and nocturia, and I encouraged him to be tested again, and consider resuming CPAP use if he does have sleep apnea as this likely will improve his nocturia.  Behavioral strategies discussed including avoiding bladder irritants, minimizing fluids prior to bedtime, and double voiding prior to bed.  He is also interested in a trial of an OAB medication, and oxybutynin 10 mg XL was sent to pharmacy, and risks and benefits discussed. ? ?Trial of oxybutynin 10 mg XL daily ?Encouraged to be re- evaluated for sleep apnea ?RTC 6 months symptom check and PVR ? ?Billey Co, MD ? ?[facility] ?[address], [address] ?[place], [place] [address] ?[phone] ? ?

## 2021-09-18 NOTE — Patient Instructions (Signed)
Sleep Apnea ?Sleep apnea is a condition in which breathing pauses or becomes shallow during sleep. People with sleep apnea usually snore loudly. They may have times when they gasp and stop breathing for 10 seconds or more during sleep. This may happen many times during the night. ?Sleep apnea disrupts your sleep and keeps your body from getting the rest that it needs. This condition can increase your risk of certain health problems, including: ?Heart attack. ?Stroke. ?Obesity. ?Type 2 diabetes. ?Heart failure. ?Irregular heartbeat. ?High blood pressure. ?The goal of treatment is to help you breathe normally again. ?What are the causes? ?The most common cause of sleep apnea is a collapsed or blocked airway. ?There are three kinds of sleep apnea: ?Obstructive sleep apnea. This kind is caused by a blocked or collapsed airway. ?Central sleep apnea. This kind happens when the part of the brain that controls breathing does not send the correct signals to the muscles that control breathing. ?Mixed sleep apnea. This is a combination of obstructive and central sleep apnea. ?What increases the risk? ?You are more likely to develop this condition if you: ?Are overweight. ?Smoke. ?Have a smaller than normal airway. ?Are older. ?Are male. ?Drink alcohol. ?Take sedatives or tranquilizers. ?Have a family history of sleep apnea. ?Have a tongue or tonsils that are larger than normal. ?What are the signs or symptoms? ?Symptoms of this condition include: ?Trouble staying asleep. ?Loud snoring. ?Morning headaches. ?Waking up gasping. ?Dry mouth or sore throat in the morning. ?Daytime sleepiness and tiredness. ?If you have daytime fatigue because of sleep apnea, you may be more likely to have: ?Trouble concentrating. ?Forgetfulness. ?Irritability or mood swings. ?Personality changes. ?Feelings of depression. ?Sexual dysfunction. This may include loss of interest if you are male, or erectile dysfunction if you are male. ?How is this  diagnosed? ?This condition may be diagnosed with: ?A medical history. ?A physical exam. ?A series of tests that are done while you are sleeping (sleep study). These tests are usually done in a sleep lab, but they may also be done at home. ?How is this treated? ?Treatment for this condition aims to restore normal breathing and to ease symptoms during sleep. It may involve managing health issues that can affect breathing, such as high blood pressure or obesity. Treatment may include: ?Sleeping on your side. ?Using a decongestant if you have nasal congestion. ?Avoiding the use of depressants, including alcohol, sedatives, and narcotics. ?Losing weight if you are overweight. ?Making changes to your diet. ?Quitting smoking. ?Using a device to open your airway while you sleep, such as: ?An oral appliance. This is a custom-made mouthpiece that shifts your lower jaw forward. ?A continuous positive airway pressure (CPAP) device. This device blows air through a mask when you breathe out (exhale). ?A nasal expiratory positive airway pressure (EPAP) device. This device has valves that you put into each nostril. ?A bi-level positive airway pressure (BIPAP) device. This device blows air through a mask when you breathe in (inhale) and breathe out (exhale). ?Having surgery if other treatments do not work. During surgery, excess tissue is removed to create a wider airway. ?Follow these instructions at home: ?Lifestyle ?Make any lifestyle changes that your health care provider recommends. ?Eat a healthy, well-balanced diet. ?Take steps to lose weight if you are overweight. ?Avoid using depressants, including alcohol, sedatives, and narcotics. ?Do not use any products that contain nicotine or tobacco. These products include cigarettes, chewing tobacco, and vaping devices, such as e-cigarettes. If you need help quitting, ask your  health care provider. ?General instructions ?Take over-the-counter and prescription medicines only as told  by your health care provider. ?If you were given a device to open your airway while you sleep, use it only as told by your health care provider. ?If you are having surgery, make sure to tell your health care provider you have sleep apnea. You may need to bring your device with you. ?Keep all follow-up visits. This is important. ?Contact a health care provider if: ?The device that you received to open your airway during sleep is uncomfortable or does not seem to be working. ?Your symptoms do not improve. ?Your symptoms get worse. ?Get help right away if: ?You develop: ?Chest pain. ?Shortness of breath. ?Discomfort in your back, arms, or stomach. ?You have: ?Trouble speaking. ?Weakness on one side of your body. ?Drooping in your face. ?These symptoms may represent a serious problem that is an emergency. Do not wait to see if the symptoms will go away. Get medical help right away. Call your local emergency services (911 in the [place] Do not drive yourself to the hospital. ?Summary ?Sleep apnea is a condition in which breathing pauses or becomes shallow during sleep. ?The most common cause is a collapsed or blocked airway. ?The goal of treatment is to restore normal breathing and to ease symptoms during sleep. ?This information is not intended to replace advice given to you by your health care provider. Make sure you discuss any questions you have with your health care provider. ?Document Revised: 02/05/2021 Document Reviewed: 06/07/2020 ?Elsevier Patient Education ? [employer]. ? ?

## 2021-09-19 LAB — URINALYSIS, COMPLETE
Bilirubin, UA: NEGATIVE
Glucose, UA: NEGATIVE
Ketones, UA: NEGATIVE
Leukocytes,UA: NEGATIVE
Nitrite, UA: NEGATIVE
Protein,UA: NEGATIVE
Specific Gravity, UA: 1.02 (ref 1.005–1.030)
Urobilinogen, Ur: 0.2 mg/dL (ref 0.2–1.0)
pH, UA: 5.5 (ref 5.0–7.5)

## 2021-09-19 LAB — MICROSCOPIC EXAMINATION
Bacteria, UA: NONE SEEN
Epithelial Cells (non renal): NONE SEEN /hpf (ref 0–10)

## 2021-10-02 ENCOUNTER — Encounter: Payer: Self-pay | Admitting: Nurse Practitioner

## 2021-10-02 ENCOUNTER — Ambulatory Visit: Payer: Medicare Other | Admitting: Nurse Practitioner

## 2021-10-02 VITALS — BP 140/80 | HR 64 | Temp 97.8°F | Ht 73.0 in | Wt 212.0 lb

## 2021-10-02 DIAGNOSIS — K219 Gastro-esophageal reflux disease without esophagitis: Secondary | ICD-10-CM

## 2021-10-02 MED ORDER — PANTOPRAZOLE SODIUM 40 MG PO TBEC: 40.0000 mg | DELAYED_RELEASE_TABLET | Freq: Every day | ORAL | 1 refills | Status: DC

## 2021-10-02 NOTE — Progress Notes (Signed)
? ? ?Careteam: ?Patient Care Team: ?Lauree Chandler, NP as PCP - General (Geriatric Medicine) ?Kate Sable, MD as PCP - Cardiology (Cardiology) ? ?Advanced Directive information ?  ? ?Allergies  ?Allergen Reactions  ? Bee Pollen Anaphylaxis  ?  Bee Venom  ? Bee Venom   ?  Throat/mouth swelling  ? ? ?Chief Complaint  ?Patient presents with  ? Acute Visit  ?  Patient would like to discuss medication. Patient needs refill on medication Pantoprazole.  ? ? ? ?HPI: Patient is a 76 y.o. male seen in today at the [facility] for medication refills.  ?He is seeing GI for reflux. He saw inflammation in stomach and recommended that he continue on Protonix. He followed up after EGD. Reports overall symptoms are controlled. Would like to get Rx here vs going back to GI.  ?Has worked on Administrator, sports.  ? ?Review of Systems:  ?Review of Systems  ?Constitutional:  Negative for chills and fever.  ?Gastrointestinal:  Negative for abdominal pain, constipation, diarrhea, heartburn, nausea and vomiting.  ? ?Past Medical History:  ?Diagnosis Date  ? Arthritis   ? Knees  ? COPD (chronic obstructive pulmonary disease) ([place])   ? Does use hearing aid   ? Bilateral  ? Enlarged prostate   ? GERD (gastroesophageal reflux disease)   ? Hypercholesterolemia   ? ?Past Surgical History:  ?Procedure Laterality Date  ? CHEEK AUGMENTATION Left 1970  ? REstructure of cheek bone   ? COLONOSCOPY  2017  ? Dr. Madolyn Frieze  ? ESOPHAGOGASTRODUODENOSCOPY (EGD) WITH PROPOFOL N/A 12/05/2020  ? Procedure: ESOPHAGOGASTRODUODENOSCOPY (EGD) WITH PROPOFOL;  Surgeon: [name], MD;  Location: [facility] ENDOSCOPY;  Service: Endoscopy;  Laterality: N/A;  ? EYE SURGERY Bilateral 2005  ? NASAL SEPTOPLASTY W/ TURBINOPLASTY Bilateral 12/24/2020  ? Procedure: NASAL SEPTOPLASTY WITH INFERIOR TURBINATE REDUCTION;  Surgeon: Carloyn Manner, MD;  Location: [facility] ORS;  Service: ENT;  Laterality: Bilateral;  ? UPPER GI ENDOSCOPY    ? ?Social History: ?  reports that he quit  smoking about 43 years ago. His smoking use included cigarettes. He has a 35.00 pack-year smoking history. He has never used smokeless tobacco. He reports that he does not currently use alcohol. He reports that he does not use drugs. ? ?Family History  ?Problem Relation Age of Onset  ? Alzheimer's disease Mother   ? Diabetes Mother   ? Heart failure Father   ? Heart attack Father   ? Diabetes Father   ? Dementia Father   ? ? ?Medications: ?Patient's Medications  ?New Prescriptions  ? No medications on file  ?Previous Medications  ? ACETAMINOPHEN (TYLENOL) 500 MG TABLET    Take 500 mg by mouth as needed.  ? APOAEQUORIN (PREVAGEN) 10 MG CAPS    Take 1 capsule by mouth daily.   ? ASCORBIC ACID (VITAMIN C) 1000 MG TABLET    Take 1,000 mg by mouth daily.  ? CETAPHIL (CETAPHIL) CREAM    Apply topically as needed.  ? CHOLECALCIFEROL (VITAMIN D3) 125 MCG (5000 UT) CAPS    Take by mouth.  ? CLOTRIMAZOLE-BETAMETHASONE (LOTRISONE) CREAM    APPLY  CREAM TOPICALLY TWICE DAILY  ? CYCLOBENZAPRINE (FLEXERIL) 10 MG TABLET    Take one tablet by mouth twice daily as needed for muscle spasms.  ? FAMOTIDINE (PEPCID) 20 MG TABLET    Take 20 mg by mouth.  ? FEXOFENADINE (ALLEGRA) 180 MG TABLET    Take 180 mg by mouth daily as needed.  ? FINASTERIDE (  PROSCAR) 5 MG TABLET    TAKE 1 TABLET BY MOUTH  DAILY  ? FLUTICASONE (FLONASE) 50 MCG/ACT NASAL SPRAY    USE 1 SPRAY IN EACH NOSTRIL EVERY DAY  ? MELATONIN 5 MG TABS    Take 5 mg by mouth at bedtime.   ? METHYLSULFONYLMETHANE (MSM) 1000 MG CAPS    Take 1 capsule by mouth 2 (two) times daily.  ? NABUMETONE (RELAFEN) 750 MG TABLET    TAKE 1 TABLET BY MOUTH  TWICE DAILY AS NEEDED  ? OXYBUTYNIN (DITROPAN-XL) 10 MG 24 HR TABLET    Take 1 tablet (10 mg total) by mouth daily.  ? PANTOPRAZOLE (PROTONIX) 40 MG TABLET    Take 1 tablet (40 mg total) by mouth daily. SCHEDULE OFFICE VISIT  ? SELENIUM 200 MCG TABS TABLET    Take by mouth daily.  ? SIMVASTATIN (ZOCOR) 40 MG TABLET    TAKE 1 TABLET BY MOUTH   DAILY  ? TAMSULOSIN (FLOMAX) 0.4 MG CAPS CAPSULE    TAKE 1 CAPSULE BY MOUTH  DAILY  ? TEMAZEPAM (RESTORIL) 15 MG CAPSULE    TAKE 1 CAPSULE BY MOUTH AT  BEDTIME AS NEEDED FOR SLEEP  ? TESTOSTERONE 2 MG/24HR PT24    Apply 1 patch daily  ? TIOTROPIUM BROMIDE MONOHYDRATE (SPIRIVA RESPIMAT) 2.5 MCG/ACT AERS    Inhale 2 puffs into the lungs daily.  ? TRIAMCINOLONE CREAM (KENALOG) 0.1 %    APPLY  CREAM EXTERNALLY TO AFFECTED AREA TWICE DAILY  ? VITAMIN B-12 (CYANOCOBALAMIN) 500 MCG TABLET    Take 500 mcg by mouth daily.  ?Modified Medications  ? No medications on file  ?Discontinued Medications  ? No medications on file  ? ? ?Physical Exam: ? ?There were no vitals filed for this visit. ?There is no height or weight on file to calculate BMI. ?Wt Readings from Last 3 Encounters:  ?09/18/21 208 lb 14.4 oz (94.8 kg)  ?08/26/21 213 lb (96.6 kg)  ?07/22/21 211 lb (95.7 kg)  ? ? ?Physical Exam ?Constitutional:   ?   Appearance: Normal appearance.  ?Abdominal:  ?   General: Bowel sounds are normal. There is no distension.  ?   Tenderness: There is no abdominal tenderness.  ?Neurological:  ?   Mental Status: He is alert. Mental status is at baseline.  ?Psychiatric:     ?   Mood and Affect: Mood normal.  ? ? ?Labs reviewed: ?Basic Metabolic Panel: ?Recent Labs  ?  01/20/21 ?0000 07/19/21 ?0000  ?NA 139 141  ?K 1.7* 4.8  ?CL 106 105  ?CO2 26* 29*  ?BUN 22* 24*  ?CREATININE 0.9 0.9  ?CALCIUM 9.8 10.2  ? ?Liver Function Tests: ?Recent Labs  ?  01/20/21 ?0000 07/19/21 ?0000  ?AST 19 20  ?ALT 19 21  ?ALKPHOS 57 66  ?ALBUMIN 4.4 4.5  ? ?No results for input(s): LIPASE, AMYLASE in the last 8760 hours. ?No results for input(s): AMMONIA in the last 8760 hours. ?CBC: ?Recent Labs  ?  01/20/21 ?0000 07/19/21 ?0000  ?WBC 5.3 6.4  ?NEUTROABS 2,348.00 2,829.00  ?HGB 15.4 16.6  ?HCT 44 48  ?PLT 121* 128*  ? ?Lipid Panel: ?Recent Labs  ?  01/20/21 ?0000 07/19/21 ?0000  ?CHOL 171 197  ?HDL 44 36  ?K[place]der 104 124  ?TRIG 130 229*  ? ?TSH: ?No results  for input(s): TSH in the last 8760 hours. ?A1C: ?No results found for: HGBA1C ? ? ?Assessment/Plan ?1. Gastroesophageal reflux disease without esophagitis ?-Protonix sent to  pharmacy  ?-dietary recommendations given  ? ? ?Carlos American. Dewaine Oats, AGNP ? ?[facility] ?[phone]  ?

## 2021-10-06 ENCOUNTER — Other Ambulatory Visit: Payer: Self-pay | Admitting: Nurse Practitioner

## 2021-10-06 DIAGNOSIS — R252 Cramp and spasm: Secondary | ICD-10-CM

## 2021-10-06 NOTE — Telephone Encounter (Signed)
Patient has request refill on medication "Cyclobenzaprine". Patient medication last refilled 09/26/2020. Patient medication has Warnings. Medication pend and sent to PCP Lauree Chandler, NP . ?

## 2021-10-16 ENCOUNTER — Ambulatory Visit: Payer: Medicare Other | Admitting: Dermatology

## 2021-10-16 DIAGNOSIS — B078 Other viral warts: Secondary | ICD-10-CM | POA: Diagnosis not present

## 2021-10-16 DIAGNOSIS — L578 Other skin changes due to chronic exposure to nonionizing radiation: Secondary | ICD-10-CM

## 2021-10-16 DIAGNOSIS — L820 Inflamed seborrheic keratosis: Secondary | ICD-10-CM

## 2021-10-16 DIAGNOSIS — L821 Other seborrheic keratosis: Secondary | ICD-10-CM

## 2021-10-16 NOTE — Progress Notes (Signed)
? ?  New Patient Visit ? ?Subjective  ?Joshua Mercado is a 76 y.o. male who presents for the following: Skin Problem (The patient has spots, moles and lesions to be evaluated, some may be new or changing. ) and Warts (Check a wart on the right foot for several months). ? ?The following portions of the chart were reviewed this encounter and updated as appropriate:  ? Tobacco  Allergies  Meds  Problems  Med Hx  Surg Hx  Fam Hx   ?  ?Review of Systems:  No other skin or systemic complaints except as noted in HPI or Assessment and Plan. ? ?Objective  ?Well appearing patient in no apparent distress; mood and affect are within normal limits. ? ?A focused examination was performed including face. Relevant physical exam findings are noted in the Assessment and Plan. ? ?right ant lateral sole ?1.4 cm Verrucous papule -- Discussed viral etiology and contagion.  ? ?right cheek, right leg  (2) (2) ?Stuck-on, waxy, tan-Segar papule  ? ? ?Assessment & Plan  ?Other viral warts ?right ant lateral sole ? ?Discussed viral etiology and risk of spread.  Discussed multiple treatments may be required to clear warts.  Discussed possible post-treatment dyspigmentation and risk of recurrence.  ? ?Start [facility] wart paste Fluorouracil: 5% ?Salicylic Acid: 32% ?Vehicle: Paste ? apply to affected foot at bedtime, cover with tape remove in the morning  ? ?Destruction of lesion - right ant lateral sole ?Complexity: simple   ?Destruction method: cryotherapy   ?Informed consent: discussed and consent obtained   ?Timeout:  patient name, date of birth, surgical site, and procedure verified ?Lesion destroyed using liquid nitrogen: Yes   ?Region frozen until ice ball extended beyond lesion: Yes   ?Outcome: patient tolerated procedure well with no complications   ?Post-procedure details: wound care instructions given   ? ?Inflamed seborrheic keratosis (2) ?right cheek, right leg  (2) ? ?Reassured benign age-related growth.  Recommend  observation.  Discussed cryotherapy if spot(s) become irritated or inflamed.  ? ?Destruction of lesion - right cheek, right leg  (2) ?Complexity: simple   ?Destruction method: cryotherapy   ?Informed consent: discussed and consent obtained   ?Timeout:  patient name, date of birth, surgical site, and procedure verified ?Lesion destroyed using liquid nitrogen: Yes   ?Region frozen until ice ball extended beyond lesion: Yes   ?Outcome: patient tolerated procedure well with no complications   ?Post-procedure details: wound care instructions given   ? ?Seborrheic Keratoses ?- Stuck-on, waxy, tan-Willow papules and/or plaques  ?- Benign-appearing ?- Discussed benign etiology and prognosis. ?- Observe ?- Call for any changes ? ?Actinic Damage ?- chronic, secondary to cumulative UV radiation exposure/sun exposure over time ?- diffuse scaly erythematous macules with underlying dyspigmentation ?- Recommend daily broad spectrum sunscreen SPF 30+ to sun-exposed areas, reapply every 2 hours as needed.  ?- Recommend staying in the shade or wearing long sleeves, sun glasses (UVA+UVB protection) and wide brim hats (4-inch brim around the entire circumference of the hat). ?- Call for new or changing lesions.  ? ?Return in about 3 months (around 01/15/2022) for wart, ISKs. ? ?I, Marye Round, CMA, am acting as scribe for Sarina Ser, MD .  ?Documentation: I have reviewed the above documentation for accuracy and completeness, and I agree with the above. ? ?Sarina Ser, MD ? ?

## 2021-10-16 NOTE — Patient Instructions (Addendum)
Instructions for [facility] Medications ?Wart paste  ?One or more of your medications was sent to the [facility] mail order compounding pharmacy. You will receive an email from them and can purchase the medicine through that link. It will then be mailed to your home at the address you confirmed. If for any reason you do not receive an email from them, please check your spam folder. If you still do not find the email, please let us know. [facility] phone number is [phone].  ? ? ? ? ? ?Cryotherapy Aftercare ? ?Wash gently with soap and water everyday.   ?Apply Vaseline and Band-Aid daily until healed.  ? ? ? ?If You Need Anything After Your Visit ? ?If you have any questions or concerns for your doctor, please call our main line at [phone] and press option 4 to reach your doctor's medical assistant. If no one answers, please leave a voicemail as directed and we will return your call as soon as possible. Messages left after 4 pm will be answered the following business day.  ? ?You may also send [place] a message via MyChart. We typically respond to MyChart messages within 1-2 business days. ? ?For prescription refills, please ask your pharmacy to contact our office. Our fax number is [phone]. ? ?If you have an urgent issue when the clinic is closed that cannot wait until the next business day, you can page your doctor at the number below.   ? ?Please note that while we do our best to be available for urgent issues outside of office hours, we are not available 24/7.  ? ?If you have an urgent issue and are unable to reach [place], you may choose to seek medical care at your doctor's office, retail clinic, urgent care center, or emergency room. ? ?If you have a medical emergency, please immediately call 911 or go to the emergency department. ? ?Pager Numbers ? ?- Dr. Nehemiah Massed: [phone] ? ?- Dr. Laurence Ferrari: [phone] ? ?- Dr. Nicole Kindred: [phone] ? ?In the event of inclement weather, please call our  main line at [phone] for an update on the status of any delays or closures. ? ?Dermatology Medication Tips: ?Please keep the boxes that topical medications come in in order to help keep track of the instructions about where and how to use these. Pharmacies typically print the medication instructions only on the boxes and not directly on the medication tubes.  ? ?If your medication is too expensive, please contact our office at [phone] option 4 or send [place] a message through [place].  ? ?We are unable to tell what your co-pay for medications will be in advance as this is different depending on your insurance coverage. However, we may be able to find a substitute medication at lower cost or fill out paperwork to get insurance to cover a needed medication.  ? ?If a prior authorization is required to get your medication covered by your insurance company, please allow [place] 1-2 business days to complete this process. ? ?Drug prices often vary depending on where the prescription is filled and some pharmacies may offer cheaper prices. ? ?The website www.goodrx.com contains coupons for medications through different pharmacies. The prices here do not account for what the cost may be with help from insurance (it may be cheaper with your insurance), but the website can give you the price if you did not use any insurance.  ?- You can print the associated coupon and take it with your prescription to the  pharmacy.  ?- You may also stop by our office during regular business hours and pick up a GoodRx coupon card.  ?- If you need your prescription sent electronically to a different pharmacy, notify our office through [facility] or by phone at [phone] option 4. ? ? ? ? ?Si Usted Necesita Algo Despu?s de Su Visita ? ?Tambi?n puede enviarnos un mensaje a trav?s de MyChart. Por lo general respondemos a los mensajes de MyChart en el transcurso de 1 a 2 d?as h?biles. ? ?Para renovar recetas, por favor pida a su  farmacia que se ponga en contacto con nuestra oficina. Nuestro n?mero de fax es el [phone]. ? ?Si tiene un asunto urgente cuando la cl?nica est? cerrada y que no puede esperar hasta el siguiente d?a h?bil, puede llamar/localizar a su doctor(a) al n?mero que aparece a continuaci?n.  ? ?Por favor, tenga en cuenta que aunque hacemos todo lo posible para estar disponibles para asuntos urgentes fuera del horario de oficina, no estamos disponibles las 24 horas del d?a, los 7 d?as de la semana.  ? ?Si tiene un problema urgente y no puede comunicarse con nosotros, puede optar por buscar atenci?n m?dica  en el consultorio de su doctor(a), en una cl?nica privada, en un centro de atenci?n urgente o en una sala de emergencias. ? ?Si tiene Engineer, maintenance (IT) m?dica, por favor llame inmediatamente al 911 o vaya a la sala de emergencias. ? ?N?meros de b?per ? ?- Dr. Nehemiah Massed: [phone] ? ?- Dra. Moye: [phone] ? ?- Dra. Nicole Kindred: [phone] ? ?En caso de inclemencias del tiempo, por favor llame a nuestra l?nea principal al [phone] para una actualizaci?n sobre el estado de cualquier retraso o cierre. ? ?Consejos para la medicaci?n en dermatolog?a: ?Por favor, guarde las cajas en las que vienen los medicamentos de uso t?pico para ayudarle a seguir las instrucciones sobre d?nde y c?mo usarlos. Las farmacias generalmente imprimen las instrucciones del medicamento s?lo en las cajas y no directamente en los tubos del [place].  ? ?Si su medicamento es muy caro, por favor, p?ngase en contacto con Zigmund Daniel llamando al [phone] y presione la opci?n 4 o env?enos un mensaje a trav?s de MyChart.  ? ?No podemos decirle cu?l ser? su copago por los medicamentos por adelantado ya que esto es diferente dependiendo de la cobertura de su seguro. Sin embargo, es posible que podamos encontrar un medicamento sustituto a Electrical engineer un formulario para que el seguro cubra el medicamento que se considera necesario.   ? ?Si se requiere Ardelia Mems autorizaci?n previa para que su compa??a de seguros [place] su medicamento, por favor perm?tanos de 1 a 2 d?as h?biles para completar este proceso. ? ?Los precios de los medicamentos var?an con frecuencia dependiendo del Environmental consultant de d?nde se surte la receta y alguna farmacias pueden ofrecer precios m?s baratos. ? ?El sitio web www.goodrx.com tiene cupones para medicamentos de Airline pilot. Los precios aqu? no tienen en cuenta lo que podr?a costar con la ayuda del seguro (puede ser m?s barato con su seguro), pero el sitio web puede darle el precio si no utiliz? ning?n seguro.  ?- Puede imprimir el cup?n correspondiente y llevarlo con su receta a la farmacia.  ?- Tambi?n puede pasar por nuestra oficina durante el horario de atenci?n regular y recoger una tarjeta de cupones de GoodRx.  ?- Si necesita que su receta se env?e electr?nicamente a Chiropodist, informe a nuestra oficina a trav?s de MyChart de [place] o por tel?fono llamando  al [phone] y presione la opci?n 4.  ?

## 2021-10-20 ENCOUNTER — Encounter: Payer: Self-pay | Admitting: Dermatology

## 2021-10-31 ENCOUNTER — Other Ambulatory Visit: Payer: Self-pay | Admitting: Nurse Practitioner

## 2021-12-09 DIAGNOSIS — H524 Presbyopia: Secondary | ICD-10-CM | POA: Diagnosis not present

## 2021-12-09 DIAGNOSIS — Z961 Presence of intraocular lens: Secondary | ICD-10-CM | POA: Diagnosis not present

## 2021-12-10 ENCOUNTER — Other Ambulatory Visit: Payer: Self-pay

## 2021-12-10 DIAGNOSIS — F5101 Primary insomnia: Secondary | ICD-10-CM

## 2021-12-10 NOTE — Telephone Encounter (Signed)
Patient has request refill on medication Temazepam '15mg'$  via fax [facility]. Patient last refill dated 05/19/2021. Patient has Non Opioid Contract signed 07/29/2021. Medication pend and sent to PCP Dewaine Oats Carlos American, NP for approval. Rodena Piety May cc'd just incase patient left voicemail for medication.

## 2021-12-11 MED ORDER — TEMAZEPAM 15 MG PO CAPS: ORAL_CAPSULE | ORAL | 5 refills | Status: DC

## 2021-12-16 ENCOUNTER — Ambulatory Visit: Payer: Medicare Other | Admitting: Nurse Practitioner

## 2021-12-16 ENCOUNTER — Telehealth: Payer: Self-pay | Admitting: Cardiology

## 2021-12-16 ENCOUNTER — Encounter: Payer: Self-pay | Admitting: Nurse Practitioner

## 2021-12-16 VITALS — BP 138/84 | HR 60 | Temp 98.4°F | Ht 73.0 in | Wt 205.0 lb

## 2021-12-16 DIAGNOSIS — R7309 Other abnormal glucose: Secondary | ICD-10-CM | POA: Diagnosis not present

## 2021-12-16 LAB — GLUCOSE, POCT (MANUAL RESULT ENTRY): POC Glucose: 137 mg/dl — AB (ref 70–99)

## 2021-12-16 NOTE — Telephone Encounter (Signed)
We have attempted to schedule several times with no success Deleting recall

## 2021-12-16 NOTE — Progress Notes (Signed)
Careteam: Patient Care Team: Lauree Chandler, NP as PCP - General (Geriatric Medicine) Kate Sable, MD as PCP - Cardiology (Cardiology)  Advanced Directive information    Allergies  Allergen Reactions   Bee Pollen Anaphylaxis    Bee Venom   Bee Venom     Throat/mouth swelling    Chief Complaint  Patient presents with   Acute Visit    Elevated blood sugar concerns      HPI: Patient is a 76 y.o. male seen in today at the [facility] for blood sugar concerns. Reports he was trying to check machine for his wife and glucose was 190. Then he was fasting and reports it was 190 again. Reports he is trying to get the controls correct on machine but unable to obtain.  No changes in vision, reports increase in urination but this has been ongoing No increase in hunger or thirst.   Review of Systems:  Review of Systems  Constitutional:  Negative for chills, fever and weight loss.  HENT:  Positive for hearing loss. Negative for tinnitus.   Respiratory:  Negative for cough, sputum production and shortness of breath.   Cardiovascular:  Negative for chest pain, palpitations and leg swelling.  Gastrointestinal:  Negative for abdominal pain, constipation, diarrhea and heartburn.  Genitourinary:  Positive for frequency. Negative for dysuria and urgency.  Musculoskeletal:  Negative for back pain, falls, joint pain and myalgias.  Skin: Negative.   Neurological:  Negative for dizziness and headaches.   Past Medical History:  Diagnosis Date   Arthritis    Knees   COPD (chronic obstructive pulmonary disease) ([place])    Does use hearing aid    Bilateral   Enlarged prostate    GERD (gastroesophageal reflux disease)    Hypercholesterolemia    Past Surgical History:  Procedure Laterality Date   CHEEK AUGMENTATION Left 1970   REstructure of cheek bone    COLONOSCOPY  2017   Dr. Madolyn Frieze   ESOPHAGOGASTRODUODENOSCOPY (EGD) WITH PROPOFOL N/A 12/05/2020   Procedure:  ESOPHAGOGASTRODUODENOSCOPY (EGD) WITH PROPOFOL;  Surgeon: [name], MD;  Location: [facility] ENDOSCOPY;  Service: Endoscopy;  Laterality: N/A;   EYE SURGERY Bilateral 2005   NASAL SEPTOPLASTY W/ TURBINOPLASTY Bilateral 12/24/2020   Procedure: NASAL SEPTOPLASTY WITH INFERIOR TURBINATE REDUCTION;  Surgeon: Carloyn Manner, MD;  Location: [facility] ORS;  Service: ENT;  Laterality: Bilateral;   UPPER GI ENDOSCOPY     Social History:   reports that he quit smoking about 38 years ago. His smoking use included cigarettes. He has a 35.00 pack-year smoking history. He has never used smokeless tobacco. He reports that he does not currently use alcohol. He reports that he does not use drugs.  Family History  Problem Relation Age of Onset   Alzheimer's disease Mother    Diabetes Mother    Heart failure Father    Heart attack Father    Diabetes Father    Dementia Father     Medications: Patient's Medications  New Prescriptions   No medications on file  Previous Medications   ACETAMINOPHEN (TYLENOL) 500 MG TABLET    Take 500 mg by mouth as needed.   APOAEQUORIN (PREVAGEN) 10 MG CAPS    Take 1 capsule by mouth daily.    ASCORBIC ACID (VITAMIN C) 1000 MG TABLET    Take 1,000 mg by mouth daily.   CHOLECALCIFEROL (VITAMIN D3) 125 MCG (5000 UT) CAPS    Take by mouth.   CLOTRIMAZOLE-BETAMETHASONE (LOTRISONE) CREAM  APPLY  CREAM TOPICALLY TWICE DAILY   CYCLOBENZAPRINE (FLEXERIL) 10 MG TABLET    TAKE 1 TABLET BY MOUTH  TWICE DAILY AS NEEDED FOR  MUSCLE SPASMS   FEXOFENADINE (ALLEGRA) 180 MG TABLET    Take 180 mg by mouth daily as needed.   FINASTERIDE (PROSCAR) 5 MG TABLET    TAKE 1 TABLET BY MOUTH  DAILY   FLUTICASONE (FLONASE) 50 MCG/ACT NASAL SPRAY    USE 1 SPRAY IN EACH NOSTRIL EVERY DAY   MELATONIN 5 MG TABS    Take 5 mg by mouth at bedtime.    METHYLSULFONYLMETHANE (MSM) 1000 MG CAPS    Take 1 capsule by mouth 2 (two) times daily.   NABUMETONE (RELAFEN) 750 MG TABLET    TAKE 1 TABLET BY MOUTH  TWICE  DAILY AS NEEDED   OXYBUTYNIN (DITROPAN-XL) 10 MG 24 HR TABLET    Take 1 tablet (10 mg total) by mouth daily.   PANTOPRAZOLE (PROTONIX) 40 MG TABLET    Take 1 tablet (40 mg total) by mouth daily.   SELENIUM 200 MCG TABS TABLET    Take by mouth daily.   SIMVASTATIN (ZOCOR) 40 MG TABLET    TAKE 1 TABLET BY MOUTH  DAILY   TAMSULOSIN (FLOMAX) 0.4 MG CAPS CAPSULE    TAKE 1 CAPSULE BY MOUTH  DAILY   TEMAZEPAM (RESTORIL) 15 MG CAPSULE    TAKE 1 CAPSULE BY MOUTH AT  BEDTIME AS NEEDED FOR SLEEP   TESTOSTERONE 2 MG/24HR PT24    Apply 1 patch daily   TIOTROPIUM BROMIDE MONOHYDRATE (SPIRIVA RESPIMAT) 2.5 MCG/ACT AERS    Inhale 2 puffs into the lungs daily.   TRIAMCINOLONE CREAM (KENALOG) 0.1 %    APPLY  CREAM EXTERNALLY TO AFFECTED AREA TWICE DAILY   VITAMIN B-12 (CYANOCOBALAMIN) 500 MCG TABLET    Take 500 mcg by mouth daily.  Modified Medications   No medications on file  Discontinued Medications   CETAPHIL (CETAPHIL) CREAM    Apply topically as needed.   FAMOTIDINE (PEPCID) 20 MG TABLET    Take 20 mg by mouth.    Physical Exam:  Vitals:   12/16/21 1107  BP: 138/84  Pulse: 60  Temp: 98.4 F (36.9 C)  TempSrc: Temporal  SpO2: 99%  Weight: 205 lb (93 kg)  Height: '6\' 1"'$  (1.854 m)   Body mass index is 27.05 kg/m. Wt Readings from Last 3 Encounters:  12/16/21 205 lb (93 kg)  10/02/21 212 lb (96.2 kg)  09/18/21 208 lb 14.4 oz (94.8 kg)    Physical Exam Constitutional:      Appearance: Normal appearance.  Skin:    General: Skin is warm and dry.  Neurological:     Mental Status: He is alert and oriented to person, place, and time. Mental status is at baseline.  Psychiatric:        Mood and Affect: Mood normal.    Labs reviewed: Basic Metabolic Panel: Recent Labs    01/20/21 0000 07/19/21 0000  NA 139 141  K 1.7* 4.8  CL 106 105  CO2 26* 29*  BUN 22* 24*  CREATININE 0.9 0.9  CALCIUM 9.8 10.2   Liver Function Tests: Recent Labs    01/20/21 0000 07/19/21 0000  AST 19 20   ALT 19 21  ALKPHOS 57 66  ALBUMIN 4.4 4.5   No results for input(s): LIPASE, AMYLASE in the last 8760 hours. No results for input(s): AMMONIA in the last 8760 hours. CBC: Recent Labs  01/20/21 0000 07/19/21 0000  WBC 5.3 6.4  NEUTROABS 2,348.00 2,829.00  HGB 15.4 16.6  HCT 44 48  PLT 121* 128*   Lipid Panel: Recent Labs    01/20/21 0000 07/19/21 0000  CHOL 171 197  HDL 44 36  LDLCALC 104 124  TRIG 130 229*   TSH: No results for input(s): TSH in the last 8760 hours. A1C: No results found for: HGBA1C   Assessment/Plan 1. Abnormal blood sugar - POC Glucose (CBG) at 137 (non-fasting)  His home meter reading higher at 195. He plans to get controls to calibrate and see if working correctly -continue dietary modifications and exercise- he has a family hx of diabetes.   Next appt: 01/27/2022 as scheduled.  Carlos American. [place], [facility] [phone]

## 2021-12-17 ENCOUNTER — Other Ambulatory Visit: Payer: Self-pay | Admitting: Nurse Practitioner

## 2021-12-17 DIAGNOSIS — N401 Enlarged prostate with lower urinary tract symptoms: Secondary | ICD-10-CM

## 2021-12-20 ENCOUNTER — Other Ambulatory Visit: Payer: Self-pay | Admitting: Nurse Practitioner

## 2021-12-20 DIAGNOSIS — N401 Enlarged prostate with lower urinary tract symptoms: Secondary | ICD-10-CM

## 2021-12-23 DIAGNOSIS — M170 Bilateral primary osteoarthritis of knee: Secondary | ICD-10-CM | POA: Diagnosis not present

## 2021-12-30 DIAGNOSIS — M170 Bilateral primary osteoarthritis of knee: Secondary | ICD-10-CM | POA: Diagnosis not present

## 2022-01-01 NOTE — Telephone Encounter (Signed)
Medication refilled by PCP Eubanks, Jessica K, NP  

## 2022-01-14 DIAGNOSIS — M170 Bilateral primary osteoarthritis of knee: Secondary | ICD-10-CM | POA: Diagnosis not present

## 2022-01-20 ENCOUNTER — Ambulatory Visit: Payer: Medicare Other | Admitting: Nurse Practitioner

## 2022-01-20 DIAGNOSIS — M5416 Radiculopathy, lumbar region: Secondary | ICD-10-CM | POA: Diagnosis not present

## 2022-01-20 DIAGNOSIS — M25552 Pain in left hip: Secondary | ICD-10-CM | POA: Diagnosis not present

## 2022-01-22 ENCOUNTER — Ambulatory Visit: Payer: Medicare Other | Admitting: Dermatology

## 2022-01-27 ENCOUNTER — Encounter: Payer: Medicare Other | Admitting: Nurse Practitioner

## 2022-01-27 ENCOUNTER — Ambulatory Visit: Payer: Medicare Other | Admitting: Nurse Practitioner

## 2022-01-27 ENCOUNTER — Telehealth: Payer: Self-pay

## 2022-01-27 ENCOUNTER — Other Ambulatory Visit: Payer: Self-pay

## 2022-01-27 ENCOUNTER — Encounter: Payer: Self-pay | Admitting: Nurse Practitioner

## 2022-01-27 ENCOUNTER — Ambulatory Visit (INDEPENDENT_AMBULATORY_CARE_PROVIDER_SITE_OTHER): Payer: Medicare Other | Admitting: Nurse Practitioner

## 2022-01-27 DIAGNOSIS — Z1211 Encounter for screening for malignant neoplasm of colon: Secondary | ICD-10-CM

## 2022-01-27 DIAGNOSIS — Z1212 Encounter for screening for malignant neoplasm of rectum: Secondary | ICD-10-CM

## 2022-01-27 DIAGNOSIS — Z0000 Encounter for general adult medical examination without abnormal findings: Secondary | ICD-10-CM

## 2022-01-27 MED ORDER — NA SULFATE-K SULFATE-MG SULF 17.5-3.13-1.6 GM/177ML PO SOLN: 354.0000 mL | Freq: Once | ORAL | 0 refills | Status: AC

## 2022-01-27 NOTE — Telephone Encounter (Signed)
Gastroenterology Pre-Procedure Review  Request Date: 04/02/2022 Requesting Physician: Dr. Allen Norris  PATIENT REVIEW QUESTIONS: The patient responded to the following health history questions as indicated:    1. Are you having any GI issues? no 2. Do you have a personal history of Polyps? no 3. Do you have a family history of Colon Cancer or Polyps? no 4. Diabetes Mellitus? no 5. Joint replacements in the past 12 months?no 6. Major health problems in the past 3 months?no 7. Any artificial heart valves, MVP, or defibrillator?no    MEDICATIONS & ALLERGIES:    Patient reports the following regarding taking any anticoagulation/antiplatelet therapy:   Plavix, Coumadin, Eliquis, Xarelto, Lovenox, Pradaxa, Brilinta, or Effient? no Aspirin? no  Patient confirms/reports the following medications:  Current Outpatient Medications  Medication Sig Dispense Refill   acetaminophen (TYLENOL) 500 MG tablet Take 500 mg by mouth as needed.     Apoaequorin (PREVAGEN) 10 MG CAPS Take 1 capsule by mouth daily.      Ascorbic Acid (VITAMIN C) 1000 MG tablet Take 1,000 mg by mouth daily.     Cholecalciferol (VITAMIN D3) 125 MCG (5000 UT) CAPS Take by mouth.     clotrimazole-betamethasone (LOTRISONE) cream APPLY  CREAM TOPICALLY TWICE DAILY 90 g 0   cyclobenzaprine (FLEXERIL) 10 MG tablet TAKE 1 TABLET BY MOUTH  TWICE DAILY AS NEEDED FOR  MUSCLE SPASMS 60 tablet 3   fexofenadine (ALLEGRA) 180 MG tablet Take 180 mg by mouth daily as needed.     finasteride (PROSCAR) 5 MG tablet TAKE 1 TABLET BY MOUTH  DAILY 90 tablet 3   fluticasone (FLONASE) 50 MCG/ACT nasal spray USE 1 SPRAY IN EACH NOSTRIL EVERY DAY 32 g 3   melatonin 5 MG TABS Take 5 mg by mouth at bedtime.      Methylsulfonylmethane (MSM) 1000 MG CAPS Take 1 capsule by mouth 2 (two) times daily.     nabumetone (RELAFEN) 750 MG tablet TAKE 1 TABLET BY MOUTH  TWICE DAILY AS NEEDED 90 tablet 3   oxybutynin (DITROPAN XL) 10 MG 24 hr tablet Take 10 mg by mouth as  needed.     pantoprazole (PROTONIX) 40 MG tablet Take 1 tablet (40 mg total) by mouth daily. 90 tablet 1   selenium 200 MCG TABS tablet Take by mouth daily.     simvastatin (ZOCOR) 40 MG tablet TAKE 1 TABLET BY MOUTH  DAILY 90 tablet 3   tamsulosin (FLOMAX) 0.4 MG CAPS capsule TAKE 1 CAPSULE BY MOUTH  DAILY 100 capsule 2   temazepam (RESTORIL) 15 MG capsule TAKE 1 CAPSULE BY MOUTH AT  BEDTIME AS NEEDED FOR SLEEP 30 capsule 5   Testosterone 2 MG/24HR PT24 Apply 1 patch daily 90 patch 1   Tiotropium Bromide Monohydrate (SPIRIVA RESPIMAT) 2.5 MCG/ACT AERS Inhale 2 puffs into the lungs daily. 12 g 2   triamcinolone cream (KENALOG) 0.1 % APPLY  CREAM EXTERNALLY TO AFFECTED AREA TWICE DAILY 80 g 1   vitamin B-12 (CYANOCOBALAMIN) 500 MCG tablet Take 500 mcg by mouth daily.     No current facility-administered medications for this visit.    Patient confirms/reports the following allergies:  Allergies  Allergen Reactions   Bee Pollen Anaphylaxis    Bee Venom   Bee Venom     Throat/mouth swelling    No orders of the defined types were placed in this encounter.   AUTHORIZATION INFORMATION Primary Insurance: 1D#: Group #:  Secondary Insurance: 1D#: Group #:  SCHEDULE INFORMATION: Date:  Time: Location:

## 2022-01-27 NOTE — Progress Notes (Signed)
   This service is provided via telemedicine  No vital signs collected/recorded due to the encounter was a telemedicine visit.   Location of patient (ex: home, work):  Home  Patient consents to a telephone visit: Yes, see telephone visit dated 01/27/22  Location of the provider (ex: office, home): [place], [place] Location   Name of any referring provider:  N/A  Names of all persons participating in the telemedicine service and their role in the encounter:  S.Chrae B/CMA, Sherrie Mustache, NP, and Patient   Time spent on call:  9 min with medical assistant

## 2022-01-27 NOTE — Progress Notes (Signed)
Subjective:   Joshua Mercado is a 76 y.o. male who presents for Medicare Annual/Subsequent preventive examination.  Review of Systems     Cardiac Risk Factors include: male gender;advanced age (>102mn, >>31women);dyslipidemia     Objective:    There were no vitals filed for this visit. There is no height or weight on file to calculate BMI.     01/27/2022    8:10 AM 07/22/2021   10:47 AM 01/22/2021   11:05 AM 01/21/2021   10:47 AM 12/24/2020    8:08 AM 12/18/2020    1:22 PM 12/05/2020    7:26 AM  Advanced Directives  Does Patient Have a Medical Advance Directive? Yes Yes Yes No Yes Yes Yes  Type of Advance Directive Out of facility DNR (pink MOST or yellow form) Out of facility DNR (pink MOST or yellow form);Living will Out of facility DNR (pink MOST or yellow form)  [place] Living will  Does patient want to make changes to medical advance directive? No - Patient declined No - Patient declined   No - Patient declined    Copy of [place]?     No - copy requested    Would patient like information on creating a medical advance directive?    No - Patient declined     Pre-existing out of facility DNR order (yellow form or pink MOST form) Pink MOST form placed in chart (order not valid for inpatient use) Pink MOST form placed in chart (order not valid for inpatient use) Pink MOST form placed in chart (order not valid for inpatient use)        Current Medications (verified) Outpatient Encounter Medications as of 01/27/2022  Medication Sig   acetaminophen (TYLENOL) 500 MG tablet Take 500 mg by mouth as needed.   Apoaequorin (PREVAGEN) 10 MG CAPS Take 1 capsule by mouth daily.    Ascorbic Acid (VITAMIN C) 1000 MG tablet Take 1,000 mg by mouth daily.   Cholecalciferol (VITAMIN D3) 125 MCG (5000 UT) CAPS Take by mouth.   clotrimazole-betamethasone (LOTRISONE) cream APPLY  CREAM TOPICALLY TWICE DAILY   cyclobenzaprine  (FLEXERIL) 10 MG tablet TAKE 1 TABLET BY MOUTH  TWICE DAILY AS NEEDED FOR  MUSCLE SPASMS   fexofenadine (ALLEGRA) 180 MG tablet Take 180 mg by mouth daily as needed.   finasteride (PROSCAR) 5 MG tablet TAKE 1 TABLET BY MOUTH  DAILY   fluticasone (FLONASE) 50 MCG/ACT nasal spray USE 1 SPRAY IN EACH NOSTRIL EVERY DAY   melatonin 5 MG TABS Take 5 mg by mouth at bedtime.    Methylsulfonylmethane (MSM) 1000 MG CAPS Take 1 capsule by mouth 2 (two) times daily.   nabumetone (RELAFEN) 750 MG tablet TAKE 1 TABLET BY MOUTH  TWICE DAILY AS NEEDED   oxybutynin (DITROPAN XL) 10 MG 24 hr tablet Take 10 mg by mouth as needed.   pantoprazole (PROTONIX) 40 MG tablet Take 1 tablet (40 mg total) by mouth daily.   selenium 200 MCG TABS tablet Take by mouth daily.   simvastatin (ZOCOR) 40 MG tablet TAKE 1 TABLET BY MOUTH  DAILY   tamsulosin (FLOMAX) 0.4 MG CAPS capsule TAKE 1 CAPSULE BY MOUTH  DAILY   temazepam (RESTORIL) 15 MG capsule TAKE 1 CAPSULE BY MOUTH AT  BEDTIME AS NEEDED FOR SLEEP   Testosterone 2 MG/24HR PT24 Apply 1 patch daily   Tiotropium Bromide Monohydrate (SPIRIVA RESPIMAT) 2.5 MCG/ACT AERS Inhale 2 puffs into the  lungs daily.   triamcinolone cream (KENALOG) 0.1 % APPLY  CREAM EXTERNALLY TO AFFECTED AREA TWICE DAILY   vitamin B-12 (CYANOCOBALAMIN) 500 MCG tablet Take 500 mcg by mouth daily.   [DISCONTINUED] oxybutynin (DITROPAN-XL) 10 MG 24 hr tablet Take 1 tablet (10 mg total) by mouth daily. (Patient not taking: Reported on 01/27/2022)   No facility-administered encounter medications on file as of 01/27/2022.    Allergies (verified) Bee pollen and Bee venom   History: Past Medical History:  Diagnosis Date   Arthritis    Knees   COPD (chronic obstructive pulmonary disease) ([place])    Does use hearing aid    Bilateral   Enlarged prostate    GERD (gastroesophageal reflux disease)    Hypercholesterolemia    Past Surgical History:  Procedure Laterality Date   CHEEK AUGMENTATION Left 1970    REstructure of cheek bone    COLONOSCOPY  2017   Dr. Madolyn Frieze   ESOPHAGOGASTRODUODENOSCOPY (EGD) WITH PROPOFOL N/A 12/05/2020   Procedure: ESOPHAGOGASTRODUODENOSCOPY (EGD) WITH PROPOFOL;  Surgeon: [name], MD;  Location: [facility] ENDOSCOPY;  Service: Endoscopy;  Laterality: N/A;   EYE SURGERY Bilateral 2005   NASAL SEPTOPLASTY W/ TURBINOPLASTY Bilateral 12/24/2020   Procedure: NASAL SEPTOPLASTY WITH INFERIOR TURBINATE REDUCTION;  Surgeon: Carloyn Manner, MD;  Location: [facility] ORS;  Service: ENT;  Laterality: Bilateral;   UPPER GI ENDOSCOPY     Family History  Problem Relation Age of Onset   Alzheimer's disease Mother    Diabetes Mother    Heart failure Father    Heart attack Father    Diabetes Father    Dementia Father    Social History   Socioeconomic History   Marital status: Married    Spouse name: Not on file   Number of children: Not on file   Years of education: Not on file   Highest education level: Not on file  Occupational History   Not on file  Tobacco Use   Smoking status: Former    Packs/day: 1.00    Years: 35.00    Total pack years: 35.00    Types: Cigarettes    Quit date: 75    Years since quitting: 38.5   Smokeless tobacco: Never  Vaping Use   Vaping Use: Never used  Substance and Sexual Activity   Alcohol use: Not Currently   Drug use: Never   Sexual activity: Not on file  Other Topics Concern   Not on file  Social History Narrative   No pets.  Clinical biochemist.  Trade school education.  Wife is a retired Engineer, drilling.    Social Determinants of Health   Financial Resource Strain: Not on file  Food Insecurity: Not on file  Transportation Needs: Not on file  Physical Activity: Not on file  Stress: Not on file  Social Connections: Not on file    Tobacco Counseling Counseling given: Not Answered   Clinical Intake:  Pre-visit preparation completed: Yes  Pain : No/denies pain     BMI - recorded: 27 Nutritional Risks: None Diabetes: No  How  often do you need to have someone help you when you read instructions, pamphlets, or other written materials from your doctor or pharmacy?: 1 - Never  Diabetic?no         Activities of Daily Living    01/27/2022    8:54 AM  In your present state of health, do you have any difficulty performing the following activities:  Hearing? 1  Vision? 0  Difficulty concentrating or making  decisions? 0  Walking or climbing stairs? 0  Dressing or bathing? 0  Doing errands, shopping? 0  Preparing Food and eating ? N  Using the Toilet? N  In the past six months, have you accidently leaked urine? N  Do you have problems with loss of bowel control? N  Managing your Medications? N  Managing your Finances? N  Housekeeping or managing your Housekeeping? N    Patient Care Team: Lauree Chandler, NP as PCP - General (Geriatric Medicine) Kate Sable, MD as PCP - Cardiology (Cardiology)  Indicate any recent Medical Services you may have received from other than [facility] providers in the past year (date may be approximate).     Assessment:   This is a routine wellness examination for [place].  Hearing/Vision screen Hearing Screening - Comments:: Patient admits to hering loss and wears hearing aids  Vision Screening - Comments:: Last eye exam June 2023, Dr.[facility], [facility]   Dietary issues and exercise activities discussed: Current Exercise Habits: Structured exercise class;Home exercise routine, Type of exercise: strength training/weights;calisthenics;stretching, Time (Minutes): 45, Frequency (Times/Week): 4, Weekly Exercise (Minutes/Week): 180   Goals Addressed   None    Depression Screen    01/27/2022    8:43 AM 01/22/2021   11:02 AM 01/18/2020   10:30 AM 01/16/2020    9:35 AM 11/21/2019   11:20 AM  PHQ 2/9 Scores  PHQ - 2 Score 0 0 0 0 0    Fall Risk    01/27/2022    8:43 AM 12/16/2021   11:16 AM 07/22/2021   10:44 AM 05/13/2021   11:31 AM 01/22/2021   11:03 AM  Fall Risk    Falls in the past year? 0 0 0 0 0  Number falls in past yr: 0 0 0 0 0  Injury with Fall? 0 0 0 0 0  Risk for fall due to : No Fall Risks No Fall Risks No Fall Risks No Fall Risks No Fall Risks  Follow up Falls evaluation completed Falls evaluation completed Falls evaluation completed Falls evaluation completed Falls evaluation completed    FALL RISK PREVENTION PERTAINING TO THE HOME:  Any stairs in or around the home? No  If so, are there any without handrails?  na Home free of loose throw rugs in walkways, pet beds, electrical cords, etc? Yes  Adequate lighting in your home to reduce risk of falls? Yes   ASSISTIVE DEVICES UTILIZED TO PREVENT FALLS:  Life alert? No  Use of a cane, walker or w/c? No  Grab bars in the bathroom? Yes  Shower chair or bench in shower? Yes  Elevated toilet seat or a handicapped toilet? Yes   TIMED UP AND GO:  Was the test performed? No .    Cognitive Function:        01/27/2022    8:45 AM 01/22/2021   11:05 AM 01/16/2020    9:38 AM  6CIT Screen  What Year? 0 points 0 points 0 points  What month? 0 points 0 points 0 points  What time? 0 points 0 points 0 points  Count back from 20 0 points 0 points 0 points  Months in reverse 0 points 0 points 0 points  Repeat phrase 2 points 2 points 2 points  Total Score 2 points 2 points 2 points    Immunizations Immunization History  Administered Date(s) Administered   Influenza, High Dose Seasonal PF 04/11/2015, 04/15/2016   Influenza-Unspecified 05/14/2019, 05/02/2020, 04/24/2021   Moderna Covid-19 Vaccine Bivalent  Booster 6yr & up 04/22/2021   Moderna Sars-Covid-2 Vaccination 08/16/2019, 09/13/2019, 05/28/2020, 11/28/2020   Pneumococcal Conjugate-13 04/13/2015, 05/13/2015   Pneumococcal Polysaccharide-23 11/24/2012   Tdap 12/11/2016   Zoster, Live 06/13/2007    TDAP status: Up to date  Flu Vaccine status: Up to date  Pneumococcal vaccine status: Up to date  Covid-19 vaccine status:  Information provided on how to obtain vaccines.   Qualifies for Shingles Vaccine? Yes   Zostavax completed No   Shingrix Completed?: No.    Education has been provided regarding the importance of this vaccine. Patient has been advised to call insurance company to determine out of pocket expense if they have not yet received this vaccine. Advised may also receive vaccine at local pharmacy or Health Dept. Verbalized acceptance and understanding.  Screening Tests Health Maintenance  Topic Date Due   Zoster Vaccines- Shingrix (1 of 2) Never done   COLONOSCOPY (Pts 45-470yrInsurance coverage will need to be confirmed)  07/13/2021   INFLUENZA VACCINE  02/10/2022   TETANUS/TDAP  12/12/2026   Pneumonia Vaccine 6566Years old  Completed   COVID-19 Vaccine  Completed   Hepatitis C Screening  Completed   HPV VACCINES  Aged Out    Health Maintenance  Health Maintenance Due  Topic Date Due   Zoster Vaccines- Shingrix (1 of 2) Never done   COLONOSCOPY (Pts [id number] coverage will need to be confirmed)  07/13/2021    Colorectal cancer screening: Type of screening: Colonoscopy. Completed 2018. Repeat every 5 years  Lung Cancer Screening: (Low Dose CT Chest recommended if Age 36-62-80ars, 30 pack-year currently smoking OR have quit w/in 15years.) does not qualify.   Lung Cancer Screening Referral: na  Additional Screening:  Hepatitis C Screening: does qualify; Completed   Vision Screening: Recommended annual ophthalmology exams for early detection of glaucoma and other disorders of the eye. Is the patient up to date with their annual eye exam?  Yes  Who is the provider or what is the name of the office in which the patient attends annual eye exams? [facility] If pt is not established with a provider, would they like to be referred to a provider to establish care? No .   Dental Screening: Recommended annual dental exams for proper oral hygiene  Community Resource Referral / Chronic Care  Management: CRR required this visit?  No   CCM required this visit?  No      Plan:     I have personally reviewed and noted the following in the patient's chart:   Medical and social history Use of alcohol, tobacco or illicit drugs  Current medications and supplements including opioid prescriptions. Patient is not currently taking opioid prescriptions. Functional ability and status Nutritional status Physical activity Advanced directives List of other physicians Hospitalizations, surgeries, and ER visits in previous 12 months Vitals Screenings to include cognitive, depression, and falls Referrals and appointments  In addition, I have reviewed and discussed with patient certain preventive protocols, quality metrics, and best practice recommendations. A written personalized care plan for preventive services as well as general preventive health recommendations were provided to patient.     JesLauree ChandlerP   01/27/2022    Virtual Visit via Telephone Note  I connected with patient 01/27/22 at  8:40 AM EDT by telephone and verified that I am speaking with the correct person using two identifiers.  Location: Patient: home Provider: [place]   I discussed the limitations, risks, security and privacy concerns of  performing an evaluation and management service by telephone and the availability of in person appointments. I also discussed with the patient that there may be a patient responsible charge related to this service. The patient expressed understanding and agreed to proceed.   I discussed the assessment and treatment plan with the patient. The patient was provided an opportunity to ask questions and all were answered. The patient agreed with the plan and demonstrated an understanding of the instructions.   The patient was advised to call back or seek an in-person evaluation if the symptoms worsen or if the condition fails to improve as anticipated.  I provided 16  minutes of non-face-to-face time during this encounter.  Carlos American. Harle Battiest Avs printed and mailed

## 2022-01-27 NOTE — Telephone Encounter (Signed)
Mr. ashawn, rinehart are scheduled for a virtual visit with your provider today.    Just as we do with appointments in the office, we must obtain your consent to participate.  Your consent will be active for this visit and any virtual visit you may have with one of our providers in the next 365 days.    If you have a MyChart account, I can also send a copy of this consent to you electronically.  All virtual visits are billed to your insurance company just like a traditional visit in the office.  As this is a virtual visit, video technology does not allow for your provider to perform a traditional examination.  This may limit your provider's ability to fully assess your condition.  If your provider identifies any concerns that need to be evaluated in person or the need to arrange testing such as labs, EKG, etc, we will make arrangements to do so.    Although advances in technology are sophisticated, we cannot ensure that it will always work on either your end or our end.  If the connection with a video visit is poor, we may have to switch to a telephone visit.  With either a video or telephone visit, we are not always able to ensure that we have a secure connection.   I need to obtain your verbal consent now.   Are you willing to proceed with your visit today?   Raydell Maners has provided verbal consent on 01/27/2022 for a virtual visit (video or telephone).   Leigh Aurora [place], [place] 01/27/2022  8:50 AM

## 2022-01-27 NOTE — Patient Instructions (Signed)
Mr. Joshua Mercado , Thank you for taking time to come for your Medicare Wellness Visit. I appreciate your ongoing commitment to your health goals. Please review the following plan we discussed and let me know if I can assist you in the future.   Screening recommendations/referrals: Colonoscopy order placed today Recommended yearly ophthalmology/optometry visit for glaucoma screening and checkup Recommended yearly dental visit for hygiene and checkup  Vaccinations: Influenza vaccine up to date Pneumococcal vaccine up to date Tdap vaccine up to date Shingles vaccine DUE- recommend to get at your local pharmacy       Advanced directives: on file.   Conditions/risks identified: advance age, hyperlipidemia.   Next appointment: yearly   Preventive Care 25 Years and Older, Male Preventive care refers to lifestyle choices and visits with your health care provider that can promote health and wellness. What does preventive care include? A yearly physical exam. This is also called an annual well check. Dental exams once or twice a year. Routine eye exams. Ask your health care provider how often you should have your eyes checked. Personal lifestyle choices, including: Daily care of your teeth and gums. Regular physical activity. Eating a healthy diet. Avoiding tobacco and drug use. Limiting alcohol use. Practicing safe sex. Taking low doses of aspirin every day. Taking vitamin and mineral supplements as recommended by your health care provider. What happens during an annual well check? The services and screenings done by your health care provider during your annual well check will depend on your age, overall health, lifestyle risk factors, and family history of disease. Counseling  Your health care provider may ask you questions about your: Alcohol use. Tobacco use. Drug use. Emotional well-being. Home and relationship well-being. Sexual activity. Eating habits. History of falls. Memory  and ability to understand (cognition). Work and work Statistician. Screening  You may have the following tests or measurements: Height, weight, and BMI. Blood pressure. Lipid and cholesterol levels. These may be checked every 5 years, or more frequently if you are over 22 years old. Skin check. Lung cancer screening. You may have this screening every year starting at age 77 if you have a 30-pack-year history of smoking and currently smoke or have quit within the past 15 years. Fecal occult blood test (FOBT) of the stool. You may have this test every year starting at age 60. Flexible sigmoidoscopy or colonoscopy. You may have a sigmoidoscopy every 5 years or a colonoscopy every 10 years starting at age 22. Prostate cancer screening. Recommendations will vary depending on your family history and other risks. Hepatitis C blood test. Hepatitis B blood test. Sexually transmitted disease (STD) testing. Diabetes screening. This is done by checking your blood sugar (glucose) after you have not eaten for a while (fasting). You may have this done every 1-3 years. Abdominal aortic aneurysm (AAA) screening. You may need this if you are a current or former smoker. Osteoporosis. You may be screened starting at age 10 if you are at high risk. Talk with your health care provider about your test results, treatment options, and if necessary, the need for more tests. Vaccines  Your health care provider may recommend certain vaccines, such as: Influenza vaccine. This is recommended every year. Tetanus, diphtheria, and acellular pertussis (Tdap, Td) vaccine. You may need a Td booster every 10 years. Zoster vaccine. You may need this after age 65. Pneumococcal 13-valent conjugate (PCV13) vaccine. One dose is recommended after age 58. Pneumococcal polysaccharide (PPSV23) vaccine. One dose is recommended after age 66.  Talk to your health care provider about which screenings and vaccines you need and how often you  need them. This information is not intended to replace advice given to you by your health care provider. Make sure you discuss any questions you have with your health care provider. Document Released: 07/26/2015 Document Revised: 03/18/2016 Document Reviewed: 04/30/2015 Elsevier Interactive Patient Education  2017 [place] Prevention in the Home Falls can cause injuries. They can happen to people of all ages. There are many things you can do to make your home safe and to help prevent falls. What can I do on the outside of my home? Regularly fix the edges of walkways and driveways and fix any cracks. Remove anything that might make you trip as you walk through a door, such as a raised step or threshold. Trim any bushes or trees on the path to your home. Use bright outdoor lighting. Clear any walking paths of anything that might make someone trip, such as rocks or tools. Regularly check to see if handrails are loose or broken. Make sure that both sides of any steps have handrails. Any raised decks and porches should have guardrails on the edges. Have any leaves, snow, or ice cleared regularly. Use sand or salt on walking paths during winter. Clean up any spills in your garage right away. This includes oil or grease spills. What can I do in the bathroom? Use night lights. Install grab bars by the toilet and in the tub and shower. Do not use towel bars as grab bars. Use non-skid mats or decals in the tub or shower. If you need to sit down in the shower, use a plastic, non-slip stool. Keep the floor dry. Clean up any water that spills on the floor as soon as it happens. Remove soap buildup in the tub or shower regularly. Attach bath mats securely with double-sided non-slip rug tape. Do not have throw rugs and other things on the floor that can make you trip. What can I do in the bedroom? Use night lights. Make sure that you have a light by your bed that is easy to reach. Do not  use any sheets or blankets that are too big for your bed. They should not hang down onto the floor. Have a firm chair that has side arms. You can use this for support while you get dressed. Do not have throw rugs and other things on the floor that can make you trip. What can I do in the kitchen? Clean up any spills right away. Avoid walking on wet floors. Keep items that you use a lot in easy-to-reach places. If you need to reach something above you, use a strong step stool that has a grab bar. Keep electrical cords out of the way. Do not use floor polish or wax that makes floors slippery. If you must use wax, use non-skid floor wax. Do not have throw rugs and other things on the floor that can make you trip. What can I do with my stairs? Do not leave any items on the stairs. Make sure that there are handrails on both sides of the stairs and use them. Fix handrails that are broken or loose. Make sure that handrails are as long as the stairways. Check any carpeting to make sure that it is firmly attached to the stairs. Fix any carpet that is loose or worn. Avoid having throw rugs at the top or bottom of the stairs. If you do have throw  rugs, attach them to the floor with carpet tape. Make sure that you have a light switch at the top of the stairs and the bottom of the stairs. If you do not have them, ask someone to add them for you. What else can I do to help prevent falls? Wear shoes that: Do not have high heels. Have rubber bottoms. Are comfortable and fit you well. Are closed at the toe. Do not wear sandals. If you use a stepladder: Make sure that it is fully opened. Do not climb a closed stepladder. Make sure that both sides of the stepladder are locked into place. Ask someone to hold it for you, if possible. Clearly mark and make sure that you can see: Any grab bars or handrails. First and last steps. Where the edge of each step is. Use tools that help you move around (mobility  aids) if they are needed. These include: Canes. Walkers. Scooters. Crutches. Turn on the lights when you go into a dark area. Replace any light bulbs as soon as they burn out. Set up your furniture so you have a clear path. Avoid moving your furniture around. If any of your floors are uneven, fix them. If there are any pets around you, be aware of where they are. Review your medicines with your doctor. Some medicines can make you feel dizzy. This can increase your chance of falling. Ask your doctor what other things that you can do to help prevent falls. This information is not intended to replace advice given to you by your health care provider. Make sure you discuss any questions you have with your health care provider. Document Released: 04/25/2009 Document Revised: 12/05/2015 Document Reviewed: 08/03/2014 Elsevier Interactive Patient Education  2017 [employer].

## 2022-02-02 DIAGNOSIS — E782 Mixed hyperlipidemia: Secondary | ICD-10-CM | POA: Diagnosis not present

## 2022-02-02 DIAGNOSIS — I10 Essential (primary) hypertension: Secondary | ICD-10-CM | POA: Diagnosis not present

## 2022-02-02 LAB — LIPID PANEL
Cholesterol: 143 (ref 0–200)
HDL: 38 (ref 35–70)
LDL Cholesterol: 83
Triglycerides: 127 (ref 40–160)

## 2022-02-02 LAB — BASIC METABOLIC PANEL
BUN: 19 (ref 4–21)
CO2: 24 — AB (ref 13–22)
Chloride: 107 (ref 99–108)
Creatinine: 0.9 (ref 0.6–1.3)
Glucose: 101
Potassium: 4 mEq/L (ref 3.5–5.1)
Sodium: 137 (ref 137–147)

## 2022-02-02 LAB — CBC AND DIFFERENTIAL
HCT: 42 (ref 41–53)
Hemoglobin: 15.1 (ref 13.5–17.5)
Platelets: 119 10*3/uL — AB (ref 150–400)
WBC: 5.6

## 2022-02-02 LAB — HEPATIC FUNCTION PANEL
ALT: 18 U/L (ref 10–40)
AST: 18 (ref 14–40)
Alkaline Phosphatase: 60 (ref 25–125)
Bilirubin, Total: 1.1

## 2022-02-02 LAB — COMPREHENSIVE METABOLIC PANEL
Albumin: 4.3 (ref 3.5–5.0)
Calcium: 9.6 (ref 8.7–10.7)
Globulin: 2.3
eGFR: 86

## 2022-02-02 LAB — CBC: RBC: 4.63 (ref 3.87–5.11)

## 2022-02-03 ENCOUNTER — Encounter: Payer: Medicare Other | Admitting: Nurse Practitioner

## 2022-02-03 ENCOUNTER — Encounter: Payer: Self-pay | Admitting: Nurse Practitioner

## 2022-02-03 ENCOUNTER — Ambulatory Visit: Payer: Medicare Other | Admitting: Nurse Practitioner

## 2022-02-03 VITALS — BP 138/100 | HR 51 | Temp 98.4°F | Ht 73.0 in | Wt 210.0 lb

## 2022-02-03 DIAGNOSIS — I10 Essential (primary) hypertension: Secondary | ICD-10-CM | POA: Diagnosis not present

## 2022-02-03 DIAGNOSIS — K219 Gastro-esophageal reflux disease without esophagitis: Secondary | ICD-10-CM | POA: Diagnosis not present

## 2022-02-03 DIAGNOSIS — D696 Thrombocytopenia, unspecified: Secondary | ICD-10-CM | POA: Diagnosis not present

## 2022-02-03 DIAGNOSIS — E782 Mixed hyperlipidemia: Secondary | ICD-10-CM

## 2022-02-03 DIAGNOSIS — N401 Enlarged prostate with lower urinary tract symptoms: Secondary | ICD-10-CM

## 2022-02-03 DIAGNOSIS — J449 Chronic obstructive pulmonary disease, unspecified: Secondary | ICD-10-CM

## 2022-02-03 DIAGNOSIS — G4733 Obstructive sleep apnea (adult) (pediatric): Secondary | ICD-10-CM

## 2022-02-03 DIAGNOSIS — R351 Nocturia: Secondary | ICD-10-CM

## 2022-02-03 NOTE — Progress Notes (Signed)
Careteam: Patient Care Team: Lauree Chandler, NP as PCP - General (Geriatric Medicine) Kate Sable, MD as PCP - Cardiology (Cardiology)  Advanced Directive information Does Patient Have a Medical Advance Directive?: Yes, Type of Advance Directive: Out of facility DNR (pink MOST or yellow form), Pre-existing out of facility DNR order (yellow form or pink MOST form): Pink MOST form placed in chart (order not valid for inpatient use), Does patient want to make changes to medical advance directive?: No - Patient declined  Allergies  Allergen Reactions   Bee Pollen Anaphylaxis    Bee Venom   Bee Venom     Throat/mouth swelling    Chief Complaint  Patient presents with   Medical Management of Chronic Issues    Routine follow-up visit. Discuss need for shingrix and colonoscopy (per patient appointment is pending) or post pone if patient refuses. Discuss frequency of PSA testing (patient stated he does not see urologist regularly). Patient denies receiving any vaccines since last visit.       HPI: Patient is a 76 y.o. male seen in today at the [facility] for routine follow up.  Has colonoscopy scheduled.   Blood pressure elevated- just came from exercising.   GERD- continues on protonix  COPD-on spiriva  BPH- on proscar and flomax- followed by urologist., added oxybutynin. But did not help so he stopped because did not help much. Continues to get up  2-3 times at night.   Hyperlipidemia- on zocor, continues with dietary modifications- lab were done  OSA- recommended re-evaluation due to worsening nocuria (as well as other benefit) has not restarted CPAP.  Review of Systems:  Review of Systems  Constitutional:  Negative for chills, fever and weight loss.  HENT:  Negative for tinnitus.   Respiratory:  Negative for cough, sputum production and shortness of breath.   Cardiovascular:  Negative for chest pain, palpitations and leg swelling.  Gastrointestinal:   Negative for abdominal pain, constipation, diarrhea and heartburn.  Genitourinary:  Positive for frequency. Negative for dysuria and urgency.  Musculoskeletal:  Negative for back pain, falls, joint pain and myalgias.  Skin: Negative.   Neurological:  Negative for dizziness and headaches.  Psychiatric/Behavioral:  Negative for depression and memory loss. The patient does not have insomnia.     Past Medical History:  Diagnosis Date   Arthritis    Knees   COPD (chronic obstructive pulmonary disease) ([place])    Does use hearing aid    Bilateral   Enlarged prostate    GERD (gastroesophageal reflux disease)    Hypercholesterolemia    Past Surgical History:  Procedure Laterality Date   CHEEK AUGMENTATION Left 1970   REstructure of cheek bone    COLONOSCOPY  2017   Dr. Madolyn Frieze   ESOPHAGOGASTRODUODENOSCOPY (EGD) WITH PROPOFOL N/A 12/05/2020   Procedure: ESOPHAGOGASTRODUODENOSCOPY (EGD) WITH PROPOFOL;  Surgeon: [name], MD;  Location: [facility] ENDOSCOPY;  Service: Endoscopy;  Laterality: N/A;   EYE SURGERY Bilateral 2005   NASAL SEPTOPLASTY W/ TURBINOPLASTY Bilateral 12/24/2020   Procedure: NASAL SEPTOPLASTY WITH INFERIOR TURBINATE REDUCTION;  Surgeon: Carloyn Manner, MD;  Location: [facility] ORS;  Service: ENT;  Laterality: Bilateral;   UPPER GI ENDOSCOPY     Social History:   reports that he quit smoking about 38 years ago. His smoking use included cigarettes. He has a 35.00 pack-year smoking history. He has never used smokeless tobacco. He reports that he does not currently use alcohol. He reports that he does not use drugs.  Family History  Problem Relation Age of Onset   Alzheimer's disease Mother    Diabetes Mother    Heart failure Father    Heart attack Father    Diabetes Father    Dementia Father     Medications: Patient's Medications  New Prescriptions   No medications on file  Previous Medications   ACETAMINOPHEN (TYLENOL) 500 MG TABLET    Take 500 mg by mouth as needed.    APOAEQUORIN (PREVAGEN) 10 MG CAPS    Take 1 capsule by mouth daily.    ASCORBIC ACID (VITAMIN C) 1000 MG TABLET    Take 1,000 mg by mouth daily.   CHOLECALCIFEROL (VITAMIN D3) 125 MCG (5000 UT) CAPS    Take by mouth.   CLOTRIMAZOLE-BETAMETHASONE (LOTRISONE) CREAM    Apply 1 Application topically 2 (two) times daily as needed.   CYCLOBENZAPRINE (FLEXERIL) 10 MG TABLET    TAKE 1 TABLET BY MOUTH  TWICE DAILY AS NEEDED FOR  MUSCLE SPASMS   FEXOFENADINE (ALLEGRA) 180 MG TABLET    Take 180 mg by mouth daily as needed.   FINASTERIDE (PROSCAR) 5 MG TABLET    TAKE 1 TABLET BY MOUTH  DAILY   FLUTICASONE (FLONASE) 50 MCG/ACT NASAL SPRAY    USE 1 SPRAY IN EACH NOSTRIL EVERY DAY   MELATONIN 5 MG TABS    Take 5 mg by mouth at bedtime.    METHYLSULFONYLMETHANE (MSM) 1000 MG CAPS    Take 1 capsule by mouth 2 (two) times daily.   MULTIPLE VITAMINS-MINERALS (MULTIVITAMIN MEN 50+) TABS    Take 1 tablet by mouth daily.   NABUMETONE (RELAFEN) 750 MG TABLET    TAKE 1 TABLET BY MOUTH  TWICE DAILY AS NEEDED   OXYBUTYNIN (DITROPAN XL) 10 MG 24 HR TABLET    Take 10 mg by mouth as needed.   PANTOPRAZOLE (PROTONIX) 40 MG TABLET    Take 1 tablet (40 mg total) by mouth daily.   SELENIUM 200 MCG TABS TABLET    Take by mouth daily.   SIMVASTATIN (ZOCOR) 40 MG TABLET    TAKE 1 TABLET BY MOUTH  DAILY   TAMSULOSIN (FLOMAX) 0.4 MG CAPS CAPSULE    TAKE 1 CAPSULE BY MOUTH  DAILY   TEMAZEPAM (RESTORIL) 15 MG CAPSULE    TAKE 1 CAPSULE BY MOUTH AT  BEDTIME AS NEEDED FOR SLEEP   TESTOSTERONE 2 MG/24HR PT24    Apply 1 patch daily   TIOTROPIUM BROMIDE MONOHYDRATE (SPIRIVA RESPIMAT) 2.5 MCG/ACT AERS    Inhale 2 puffs into the lungs daily.   TRIAMCINOLONE CREAM (KENALOG) 0.1 %    APPLY  CREAM EXTERNALLY TO AFFECTED AREA TWICE DAILY   VITAMIN B-12 (CYANOCOBALAMIN) 500 MCG TABLET    Take 500 mcg by mouth daily.  Modified Medications   No medications on file  Discontinued Medications   CLOTRIMAZOLE-BETAMETHASONE (LOTRISONE) CREAM    APPLY   CREAM TOPICALLY TWICE DAILY    Physical Exam:  Vitals:   02/03/22 1045 02/03/22 1159  BP: (!) 150/98 (!) 138/100  Pulse: (!) 51   Temp: 98.4 F (36.9 C)   TempSrc: Temporal   SpO2: 93%   Weight: 210 lb (95.3 kg)   Height: '6\' 1"'$  (1.854 m)    Body mass index is 27.71 kg/m. Wt Readings from Last 3 Encounters:  02/03/22 210 lb (95.3 kg)  12/16/21 205 lb (93 kg)  10/02/21 212 lb (96.2 kg)    Physical Exam Constitutional:      General:  He is not in acute distress.    Appearance: He is well-developed. He is not diaphoretic.  HENT:     Head: Normocephalic and atraumatic.     Right Ear: External ear normal.     Left Ear: External ear normal.     Mouth/Throat:     Pharynx: No oropharyngeal exudate.  Eyes:     Conjunctiva/sclera: Conjunctivae normal.     Pupils: Pupils are equal, round, and reactive to light.  Cardiovascular:     Rate and Rhythm: Normal rate and regular rhythm.     Heart sounds: Normal heart sounds.  Pulmonary:     Effort: Pulmonary effort is normal.     Breath sounds: Normal breath sounds.  Abdominal:     General: Bowel sounds are normal.     Palpations: Abdomen is soft.  Musculoskeletal:        General: No tenderness.     Cervical back: Normal range of motion and neck supple.     Right lower leg: No edema.     Left lower leg: No edema.  Skin:    General: Skin is warm and dry.  Neurological:     Mental Status: He is alert and oriented to person, place, and time.     Labs reviewed: Basic Metabolic Panel: Recent Labs    07/19/21 0000 02/02/22 0000  NA 141 137  K 4.8 4.0  CL 105 107  CO2 29* 24*  BUN 24* 19  CREATININE 0.9 0.9  CALCIUM 10.2 9.6   Liver Function Tests: Recent Labs    07/19/21 0000 02/02/22 0000  AST 20 18  ALT 21 18  ALKPHOS 66 60  ALBUMIN 4.5 4.3   No results for input(s): "LIPASE", "AMYLASE" in the last 8760 hours. No results for input(s): "AMMONIA" in the last 8760 hours. CBC: Recent Labs    07/19/21 0000  02/02/22 0000  WBC 6.4 5.6  NEUTROABS 2,829.00  --   HGB 16.6 15.1  HCT 48 42  PLT 128* 119*   Lipid Panel: Recent Labs    07/19/21 0000 02/02/22 0000  CHOL 197 143  HDL 36 38  LDLCALC 124 83  TRIG 229* 127   TSH: No results for input(s): "TSH" in the last 8760 hours. A1C: No results found for: "HGBA1C"   Assessment/Plan 1. Gastroesophageal reflux disease without esophagitis -controlled on protonix  2. BPH associated with nocturia Stable, ongoing nocturia, continues on proscar and flomax  3. Essential hypertension -Blood pressure elevated today but typically well controlled -home blood pressures are well controlled -will have pt continue to monitor home bp goal <140/90, to notify if readings remain high on 3 different days  -follow metabolic panel -dietary modifications.   4. Thrombocytopenia ([place]) -stable at this time., no abnormal bruising or bleeding.  5. Chronic obstructive pulmonary disease, unspecified COPD type ([place]) -controlled on   6. OSA (obstructive sleep apnea) -not using CPAP at this time, discussed benefits of compliance.   7. Mixed hyperlipidemia -improved LDL on recent labs. Continues on zocor 40 mg daily    Next appt: 6 months then will go to yearly visits.  Carlos American. [place], [facility] [phone]

## 2022-02-10 ENCOUNTER — Other Ambulatory Visit: Payer: Self-pay | Admitting: Nurse Practitioner

## 2022-02-10 DIAGNOSIS — M170 Bilateral primary osteoarthritis of knee: Secondary | ICD-10-CM

## 2022-02-24 ENCOUNTER — Other Ambulatory Visit: Payer: Self-pay | Admitting: Nurse Practitioner

## 2022-02-24 DIAGNOSIS — J302 Other seasonal allergic rhinitis: Secondary | ICD-10-CM

## 2022-02-26 ENCOUNTER — Other Ambulatory Visit: Payer: Self-pay | Admitting: Nurse Practitioner

## 2022-02-26 DIAGNOSIS — N401 Enlarged prostate with lower urinary tract symptoms: Secondary | ICD-10-CM

## 2022-03-13 ENCOUNTER — Telehealth: Payer: Self-pay

## 2022-03-13 ENCOUNTER — Telehealth: Payer: Self-pay | Admitting: Gastroenterology

## 2022-03-13 NOTE — Telephone Encounter (Signed)
Patients call has been returned.  He has requested to reschedule from 04/02/22 to earlier or later.  The new date is 05/05/22.  Trish in Endo has been notified.  Thanks, [place], [place]

## 2022-03-13 NOTE — Telephone Encounter (Signed)
Pt left message to cancel his colonoscopy and resched

## 2022-03-23 DIAGNOSIS — M2578 Osteophyte, vertebrae: Secondary | ICD-10-CM | POA: Diagnosis not present

## 2022-03-23 DIAGNOSIS — M50323 Other cervical disc degeneration at C6-C7 level: Secondary | ICD-10-CM | POA: Diagnosis not present

## 2022-03-23 DIAGNOSIS — I6522 Occlusion and stenosis of left carotid artery: Secondary | ICD-10-CM | POA: Diagnosis not present

## 2022-03-23 DIAGNOSIS — G43109 Migraine with aura, not intractable, without status migrainosus: Secondary | ICD-10-CM | POA: Diagnosis not present

## 2022-03-23 DIAGNOSIS — Z79899 Other long term (current) drug therapy: Secondary | ICD-10-CM | POA: Diagnosis not present

## 2022-03-23 DIAGNOSIS — I10 Essential (primary) hypertension: Secondary | ICD-10-CM | POA: Diagnosis not present

## 2022-03-23 DIAGNOSIS — M542 Cervicalgia: Secondary | ICD-10-CM | POA: Diagnosis not present

## 2022-03-24 ENCOUNTER — Other Ambulatory Visit: Payer: Self-pay | Admitting: Nurse Practitioner

## 2022-03-24 NOTE — Telephone Encounter (Signed)
This was rx 10/02/21 # 90 with 1 refill. Should not be out til later this month. Has an appt 9/14 with Janett Billow

## 2022-03-25 ENCOUNTER — Ambulatory Visit: Payer: Medicare Other | Admitting: Urology

## 2022-03-25 ENCOUNTER — Other Ambulatory Visit: Payer: Self-pay | Admitting: Neurology

## 2022-03-25 DIAGNOSIS — M542 Cervicalgia: Secondary | ICD-10-CM

## 2022-03-26 ENCOUNTER — Ambulatory Visit: Payer: Medicare Other | Admitting: Nurse Practitioner

## 2022-03-26 VITALS — BP 138/82 | HR 60 | Resp 20

## 2022-03-26 DIAGNOSIS — I10 Essential (primary) hypertension: Secondary | ICD-10-CM | POA: Diagnosis not present

## 2022-03-26 DIAGNOSIS — M542 Cervicalgia: Secondary | ICD-10-CM

## 2022-03-26 NOTE — Progress Notes (Signed)
Careteam: Patient Care Team: Lauree Chandler, NP as PCP - General (Geriatric Medicine) Kate Sable, MD as PCP - Cardiology (Cardiology)  Advanced Directive information    Allergies  Allergen Reactions   Bee Pollen Anaphylaxis    Bee Venom   Bee Venom     Throat/mouth swelling    Chief Complaint  Patient presents with   Acute Visit    Cramping in various areas, requesting referral to specialist.      HPI: Patient is a 76 y.o. male seen in today at the [facility] for follow up.  He went to neurologist 3 days ago and wanted to follow up today for updated.   Has not had any more episode of the spasm/pain in his face/neck and reports it was rare when it happened but then had 2 episodes in a row and that why he decided to be seen about it.  His friend has recommended the neurologist and they had an opening 2 days ago so he was seen.   Noted black spots in field of vision with pressure- thought it was due to blood pressure but couldn't check his blood pressure. Started on magnesium and no recurrent symptoms. =  Blood pressure has been elevated- he reports he likes salt. He was given a low sodium diet. Plans to adhere to this.    Review of Systems:  Review of Systems  Constitutional:  Negative for chills, fever and weight loss.  HENT:  Positive for hearing loss. Negative for congestion, sore throat and tinnitus.   Respiratory:  Negative for cough, sputum production and shortness of breath.   Cardiovascular:  Negative for chest pain, palpitations and leg swelling.  Gastrointestinal:  Negative for abdominal pain, constipation, diarrhea and heartburn.  Genitourinary:  Negative for dysuria, frequency and urgency.  Musculoskeletal:  Positive for myalgias. Negative for back pain, falls and joint pain.  Skin: Negative.   Neurological:  Negative for dizziness and headaches.    Past Medical History:  Diagnosis Date   Arthritis    Knees   COPD (chronic  obstructive pulmonary disease) ([place])    Does use hearing aid    Bilateral   Enlarged prostate    GERD (gastroesophageal reflux disease)    Hypercholesterolemia    Past Surgical History:  Procedure Laterality Date   CHEEK AUGMENTATION Left 1970   REstructure of cheek bone    COLONOSCOPY  2017   Dr. Madolyn Frieze   ESOPHAGOGASTRODUODENOSCOPY (EGD) WITH PROPOFOL N/A 12/05/2020   Procedure: ESOPHAGOGASTRODUODENOSCOPY (EGD) WITH PROPOFOL;  Surgeon: [name], MD;  Location[facility] ENDOSCOPY;  Service: Endoscopy;  Laterality: N/A;   EYE SURGERY Bilateral 2005   NASAL SEPTOPLASTY W/ TURBINOPLASTY Bilateral 12/24/2020   Procedure: NASAL SEPTOPLASTY WITH INFERIOR TURBINATE REDUCTION;  Surgeon: Carloyn Manner, MD;  Location: [facility] ORS;  Service: ENT;  Laterality: Bilateral;   UPPER GI ENDOSCOPY     Social History:   reports that he quit smoking about 38 years ago. His smoking use included cigarettes. He has a 35.00 pack-year smoking history. He has never used smokeless tobacco. He reports that he does not currently use alcohol. He reports that he does not use drugs.  Family History  Problem Relation Age of Onset   Alzheimer's disease Mother    Diabetes Mother    Heart failure Father    Heart attack Father    Diabetes Father    Dementia Father     Medications: Patient's Medications  New Prescriptions   No medications  on file  Previous Medications   ACETAMINOPHEN (TYLENOL) 500 MG TABLET    Take 500 mg by mouth as needed.   APOAEQUORIN (PREVAGEN) 10 MG CAPS    Take 1 capsule by mouth daily.    ASCORBIC ACID (VITAMIN C) 1000 MG TABLET    Take 1,000 mg by mouth daily.   CHOLECALCIFEROL (VITAMIN D3) 125 MCG (5000 UT) CAPS    Take by mouth.   CLOTRIMAZOLE-BETAMETHASONE (LOTRISONE) CREAM    Apply 1 Application topically 2 (two) times daily as needed.   CYCLOBENZAPRINE (FLEXERIL) 10 MG TABLET    TAKE 1 TABLET BY MOUTH  TWICE DAILY AS NEEDED FOR  MUSCLE SPASMS   FEXOFENADINE (ALLEGRA) 180 MG TABLET     Take 180 mg by mouth daily as needed.   FINASTERIDE (PROSCAR) 5 MG TABLET    TAKE 1 TABLET BY MOUTH  DAILY   FLUTICASONE (FLONASE) 50 MCG/ACT NASAL SPRAY    USE 1 SPRAY IN BOTH  NOSTRILS DAILY   MELATONIN 5 MG TABS    Take 5 mg by mouth at bedtime.    METHYLSULFONYLMETHANE (MSM) 1000 MG CAPS    Take 1 capsule by mouth 2 (two) times daily.   MULTIPLE VITAMINS-MINERALS (MULTIVITAMIN MEN 50+) TABS    Take 1 tablet by mouth daily.   NABUMETONE (RELAFEN) 750 MG TABLET    TAKE 1 TABLET BY MOUTH  TWICE DAILY AS NEEDED   OXYBUTYNIN (DITROPAN XL) 10 MG 24 HR TABLET    Take 10 mg by mouth as needed.   PANTOPRAZOLE (PROTONIX) 40 MG TABLET    Take 1 tablet (40 mg total) by mouth daily.   SELENIUM 200 MCG TABS TABLET    Take by mouth daily.   SIMVASTATIN (ZOCOR) 40 MG TABLET    TAKE 1 TABLET BY MOUTH  DAILY   TAMSULOSIN (FLOMAX) 0.4 MG CAPS CAPSULE    TAKE 1 CAPSULE BY MOUTH  DAILY   TEMAZEPAM (RESTORIL) 15 MG CAPSULE    TAKE 1 CAPSULE BY MOUTH AT  BEDTIME AS NEEDED FOR SLEEP   TESTOSTERONE 2 MG/24HR PT24    Apply 1 patch daily   TIOTROPIUM BROMIDE MONOHYDRATE (SPIRIVA RESPIMAT) 2.5 MCG/ACT AERS    Inhale 2 puffs into the lungs daily.   TRIAMCINOLONE CREAM (KENALOG) 0.1 %    APPLY  CREAM EXTERNALLY TO AFFECTED AREA TWICE DAILY   VITAMIN B-12 (CYANOCOBALAMIN) 500 MCG TABLET    Take 500 mcg by mouth daily.  Modified Medications   No medications on file  Discontinued Medications   No medications on file    Physical Exam:  Vitals:   03/26/22 1046  BP: 138/82  Pulse: 60  Resp: 20  SpO2: 96%   There is no height or weight on file to calculate BMI. Wt Readings from Last 3 Encounters:  02/03/22 210 lb (95.3 kg)  12/16/21 205 lb (93 kg)  10/02/21 212 lb (96.2 kg)    Physical Exam Constitutional:      General: He is not in acute distress.    Appearance: He is well-developed. He is not diaphoretic.  HENT:     Head: Normocephalic and atraumatic.     Right Ear: External ear normal.     Left Ear:  External ear normal.     Mouth/Throat:     Pharynx: No oropharyngeal exudate.  Eyes:     Conjunctiva/sclera: Conjunctivae normal.     Pupils: Pupils are equal, round, and reactive to light.  Cardiovascular:     Rate  and Rhythm: Normal rate and regular rhythm.     Heart sounds: Normal heart sounds.  Pulmonary:     Effort: Pulmonary effort is normal.     Breath sounds: Normal breath sounds.  Abdominal:     General: Bowel sounds are normal.     Palpations: Abdomen is soft.  Musculoskeletal:        General: No tenderness.     Cervical back: Normal range of motion and neck supple.     Right lower leg: No edema.     Left lower leg: No edema.  Skin:    General: Skin is warm and dry.  Neurological:     Mental Status: He is alert and oriented to person, place, and time.     Labs reviewed: Basic Metabolic Panel: Recent Labs    07/19/21 0000 02/02/22 0000  NA 141 137  K 4.8 4.0  CL 105 107  CO2 29* 24*  BUN 24* 19  CREATININE 0.9 0.9  CALCIUM 10.2 9.6   Liver Function Tests: Recent Labs    07/19/21 0000 02/02/22 0000  AST 20 18  ALT 21 18  ALKPHOS 66 60  ALBUMIN 4.5 4.3   No results for input(s): "LIPASE", "AMYLASE" in the last 8760 hours. No results for input(s): "AMMONIA" in the last 8760 hours. CBC: Recent Labs    07/19/21 0000 02/02/22 0000  WBC 6.4 5.6  NEUTROABS 2,829.00  --   HGB 16.6 15.1  HCT 48 42  PLT 128* 119*   Lipid Panel: Recent Labs    07/19/21 0000 02/02/22 0000  CHOL 197 143  HDL 36 38  LDLCALC 124 83  TRIG 229* 127   TSH: No results for input(s): "TSH" in the last 8760 hours. A1C: No results found for: "HGBA1C"   Assessment/Plan 1. Essential hypertension -controlled today, to continue to work on dietary modifications.  2. Cervicalgia Occasional pain that does not last long but painful.  Has not had episode in the last few days. -Being followed by neurology, lab work and imaging.     Carlos American. [place], [facility] [phone]

## 2022-04-08 ENCOUNTER — Ambulatory Visit
Admission: RE | Admit: 2022-04-08 | Discharge: 2022-04-08 | Disposition: A | Discharge status: Home / Self Care | Payer: Medicare Other | Source: Ambulatory Visit | Attending: Neurology | Admitting: Neurology

## 2022-04-08 DIAGNOSIS — M542 Cervicalgia: Secondary | ICD-10-CM

## 2022-04-08 DIAGNOSIS — M4802 Spinal stenosis, cervical region: Secondary | ICD-10-CM | POA: Diagnosis not present

## 2022-04-13 DIAGNOSIS — J340 Abscess, furuncle and carbuncle of nose: Secondary | ICD-10-CM | POA: Diagnosis not present

## 2022-04-13 DIAGNOSIS — J3489 Other specified disorders of nose and nasal sinuses: Secondary | ICD-10-CM | POA: Diagnosis not present

## 2022-04-29 ENCOUNTER — Telehealth: Payer: Self-pay | Admitting: Adult Health

## 2022-04-29 NOTE — Telephone Encounter (Signed)
Patient last seen 03/2020 with pending appt 05/14/2022.  He is questioning if he can switch from spiriva to Albuterol. He is aware that Spiriva is a maintenance inhaler and albuterol is a rescue inhaler. He is aware that Albuterol can not replace Spiriva.  He is aware that medications can be discussed at upcoming appt, as he is past due.  Nothing further needed.

## 2022-05-05 ENCOUNTER — Encounter
Admission: RE | Disposition: A | Discharge status: Home / Self Care | Payer: Self-pay | Source: Home / Self Care | Attending: Gastroenterology

## 2022-05-05 ENCOUNTER — Other Ambulatory Visit: Payer: Self-pay

## 2022-05-05 ENCOUNTER — Ambulatory Visit: Payer: Medicare Other | Admitting: Certified Registered"

## 2022-05-05 ENCOUNTER — Ambulatory Visit
Admission: RE | Admit: 2022-05-05 | Discharge: 2022-05-05 | Disposition: A | Discharge status: Home / Self Care | Payer: Medicare Other | Attending: Gastroenterology | Admitting: Gastroenterology

## 2022-05-05 ENCOUNTER — Encounter: Payer: Self-pay | Admitting: Gastroenterology

## 2022-05-05 DIAGNOSIS — K219 Gastro-esophageal reflux disease without esophagitis: Secondary | ICD-10-CM | POA: Insufficient documentation

## 2022-05-05 DIAGNOSIS — K635 Polyp of colon: Secondary | ICD-10-CM

## 2022-05-05 DIAGNOSIS — D122 Benign neoplasm of ascending colon: Secondary | ICD-10-CM | POA: Insufficient documentation

## 2022-05-05 DIAGNOSIS — M1990 Unspecified osteoarthritis, unspecified site: Secondary | ICD-10-CM | POA: Diagnosis not present

## 2022-05-05 DIAGNOSIS — J449 Chronic obstructive pulmonary disease, unspecified: Secondary | ICD-10-CM | POA: Insufficient documentation

## 2022-05-05 DIAGNOSIS — I10 Essential (primary) hypertension: Secondary | ICD-10-CM | POA: Insufficient documentation

## 2022-05-05 DIAGNOSIS — Z8249 Family history of ischemic heart disease and other diseases of the circulatory system: Secondary | ICD-10-CM | POA: Insufficient documentation

## 2022-05-05 DIAGNOSIS — K640 First degree hemorrhoids: Secondary | ICD-10-CM | POA: Diagnosis not present

## 2022-05-05 DIAGNOSIS — Z1211 Encounter for screening for malignant neoplasm of colon: Secondary | ICD-10-CM | POA: Insufficient documentation

## 2022-05-05 DIAGNOSIS — D124 Benign neoplasm of descending colon: Secondary | ICD-10-CM | POA: Diagnosis not present

## 2022-05-05 DIAGNOSIS — G4730 Sleep apnea, unspecified: Secondary | ICD-10-CM | POA: Insufficient documentation

## 2022-05-05 DIAGNOSIS — Z87891 Personal history of nicotine dependence: Secondary | ICD-10-CM | POA: Insufficient documentation

## 2022-05-05 HISTORY — PX: COLONOSCOPY WITH PROPOFOL: SHX5780

## 2022-05-05 SURGERY — COLONOSCOPY WITH PROPOFOL
Anesthesia: General

## 2022-05-05 MED ORDER — SODIUM CHLORIDE 0.9 % IV SOLN: INTRAVENOUS | Status: DC

## 2022-05-05 MED ORDER — PROPOFOL 10 MG/ML IV BOLUS
INTRAVENOUS | Status: DC | PRN
  Administered 2022-05-05: 70 mg via INTRAVENOUS
  Administered 2022-05-05: 20 mg via INTRAVENOUS
  Administered 2022-05-05: 10 mg via INTRAVENOUS
  Administered 2022-05-05: 20 mg via INTRAVENOUS

## 2022-05-05 MED ORDER — PROPOFOL 500 MG/50ML IV EMUL
INTRAVENOUS | Status: DC | PRN
  Administered 2022-05-05: 140 ug/kg/min via INTRAVENOUS

## 2022-05-05 MED ORDER — LIDOCAINE HCL (CARDIAC) PF 100 MG/5ML IV SOSY
PREFILLED_SYRINGE | INTRAVENOUS | Status: DC | PRN
  Administered 2022-05-05: 60 mg via INTRAVENOUS

## 2022-05-05 NOTE — H&P (Signed)
[name], MD [facility] [address] [place], [place] [address] Phone: [phone] Fax : [phone]  Primary Care Physician:  Lauree Chandler, NP Primary Gastroenterologist:  Dr. Allen Norris  Pre-Procedure History & Physical: HPI:  Joshua Mercado is a 76 y.o. male is here for a screening colonoscopy.   Past Medical History:  Diagnosis Date   Arthritis    Knees   COPD (chronic obstructive pulmonary disease) ([place])    Does use hearing aid    Bilateral   Enlarged prostate    GERD (gastroesophageal reflux disease)    Hypercholesterolemia     Past Surgical History:  Procedure Laterality Date   CHEEK AUGMENTATION Left 1970   REstructure of cheek bone    COLONOSCOPY  2017   Dr. Madolyn Frieze   ESOPHAGOGASTRODUODENOSCOPY (EGD) WITH PROPOFOL N/A 12/05/2020   Procedure: ESOPHAGOGASTRODUODENOSCOPY (EGD) WITH PROPOFOL;  Surgeon: [name], MD;  Location: [facility] ENDOSCOPY;  Service: Endoscopy;  Laterality: N/A;   EYE SURGERY Bilateral 2005   NASAL SEPTOPLASTY W/ TURBINOPLASTY Bilateral 12/24/2020   Procedure: NASAL SEPTOPLASTY WITH INFERIOR TURBINATE REDUCTION;  Surgeon: Carloyn Manner, MD;  Location: [facility] ORS;  Service: ENT;  Laterality: Bilateral;   UPPER GI ENDOSCOPY      Prior to Admission medications   Medication Sig Start Date End Date Taking? Authorizing Provider  Ascorbic Acid (VITAMIN C) 1000 MG tablet Take 1,000 mg by mouth daily.   Yes [provider]  Cholecalciferol (VITAMIN D3) 125 MCG (5000 UT) CAPS Take by mouth.   Yes [provider]  Tiotropium Bromide Monohydrate (SPIRIVA RESPIMAT) 2.5 MCG/ACT AERS Inhale 2 puffs into the lungs daily. 09/26/20  Yes Lauree Chandler, NP  vitamin B-12 (CYANOCOBALAMIN) 500 MCG tablet Take 500 mcg by mouth daily.   Yes [provider]  acetaminophen (TYLENOL) 500 MG tablet Take 500 mg by mouth as needed.    [provider]  Apoaequorin (PREVAGEN) 10 MG CAPS Take 1 capsule by mouth daily.     [provider]  clotrimazole-betamethasone (LOTRISONE) cream Apply 1 Application topically 2 (two) times daily as needed.    [provider]  cyclobenzaprine (FLEXERIL) 10 MG tablet TAKE 1 TABLET BY MOUTH  TWICE DAILY AS NEEDED FOR  MUSCLE SPASMS 10/06/21   Lauree Chandler, NP  fexofenadine (ALLEGRA) 180 MG tablet Take 180 mg by mouth daily as needed.    [provider]  finasteride (PROSCAR) 5 MG tablet TAKE 1 TABLET BY MOUTH  DAILY 02/26/22   Lauree Chandler, NP  fluticasone (FLONASE) 50 MCG/ACT nasal spray USE 1 SPRAY IN BOTH  NOSTRILS DAILY 02/24/22   Lauree Chandler, NP  melatonin 5 MG TABS Take 5 mg by mouth at bedtime.     [provider]  Methylsulfonylmethane (MSM) 1000 MG CAPS Take 1 capsule by mouth 2 (two) times daily.    [provider]  Multiple Vitamins-Minerals (MULTIVITAMIN MEN 50+) TABS Take 1 tablet by mouth daily.    [provider]  nabumetone (RELAFEN) 750 MG tablet TAKE 1 TABLET BY MOUTH  TWICE DAILY AS NEEDED 02/10/22   Lauree Chandler, NP  oxybutynin (DITROPAN XL) 10 MG 24 hr tablet Take 10 mg by mouth as needed. Patient not taking: Reported on 05/05/2022    [provider]  pantoprazole (PROTONIX) 40 MG tablet Take 1 tablet (40 mg total) by mouth daily. 10/02/21   Lauree Chandler, NP  selenium 200 MCG TABS tablet Take by mouth daily.  [provider]  simvastatin (ZOCOR) 40 MG tablet TAKE 1 TABLET BY MOUTH  DAILY 12/19/20   Lauree Chandler, NP  tamsulosin (FLOMAX) 0.4 MG CAPS capsule TAKE 1 CAPSULE BY MOUTH  DAILY 12/22/21   Lauree Chandler, NP  temazepam (RESTORIL) 15 MG capsule TAKE 1 CAPSULE BY MOUTH AT  BEDTIME AS NEEDED FOR SLEEP 12/11/21   Lauree Chandler, NP  Testosterone 2 MG/24HR PT24 Apply 1 patch daily 10/08/20   Lauree Chandler, NP  triamcinolone cream (KENALOG) 0.1 % APPLY  CREAM EXTERNALLY TO AFFECTED AREA TWICE DAILY 05/01/21   Lauree Chandler, NP    Allergies as of  01/27/2022 - Review Complete 01/27/2022  Allergen Reaction Noted   Bee pollen Anaphylaxis 05/13/2020   Bee venom  05/23/2019    Family History  Problem Relation Age of Onset   Alzheimer's disease Mother    Diabetes Mother    Heart failure Father    Heart attack Father    Diabetes Father    Dementia Father     Social History   Socioeconomic History   Marital status: Married    Spouse name: Not on file   Number of children: Not on file   Years of education: Not on file   Highest education level: Not on file  Occupational History   Not on file  Tobacco Use   Smoking status: Former    Packs/day: 1.00    Years: 35.00    Total pack years: 35.00    Types: Cigarettes    Quit date: 1985    Years since quitting: 38.8   Smokeless tobacco: Never  Vaping Use   Vaping Use: Never used  Substance and Sexual Activity   Alcohol use: Not Currently   Drug use: Never   Sexual activity: Not on file  Other Topics Concern   Not on file  Social History Narrative   No pets.  Clinical biochemist.  Trade school education.  Wife is a retired Engineer, drilling.    Social Determinants of Health   Financial Resource Strain: Not on file  Food Insecurity: Not on file  Transportation Needs: Not on file  Physical Activity: Not on file  Stress: Not on file  Social Connections: Not on file  Intimate Partner Violence: Not on file    Review of Systems: See HPI, otherwise negative ROS  Physical Exam: BP 135/86   Pulse (!) 49   Temp (!) 96.8 F (36 C) (Temporal)   Resp 16   Ht '6\' 1"'$  (1.854 m)   Wt 94.3 kg   SpO2 100%   BMI 27.44 kg/m  General:   Alert,  pleasant and cooperative in NAD Head:  Normocephalic and atraumatic. Neck:  Supple; no masses or thyromegaly. Lungs:  Clear throughout to auscultation.    Heart:  Regular rate and rhythm. Abdomen:  Soft, nontender and nondistended. Normal bowel sounds, without guarding, and without rebound.   Neurologic:  Alert and  oriented x4;  grossly normal  neurologically.  Impression/Plan: Joshua Mercado is now here to undergo a screening colonoscopy.  Risks, benefits, and alternatives regarding colonoscopy have been reviewed with the patient.  Questions have been answered.  All parties agreeable.

## 2022-05-05 NOTE — Op Note (Signed)
[facility] Gastroenterology Patient Name: Joshua Mercado Procedure Date: 05/05/2022 8:52 AM MRN: [id number] Account #: [id number] Date of Birth: 1946/05/19 Admit Type: Outpatient Age: 76 Room: [facility] ENDO ROOM 4 Gender: Male Note Status: Finalized Instrument Name: Jasper Riling [id number] Procedure:             Colonoscopy Indications:           Screening for colorectal malignant neoplasm Providers:             [name] MD, MD Referring MD:          Carlos American. Dewaine Oats (Referring MD) Medicines:             Propofol per Anesthesia Complications:         No immediate complications. Procedure:             Pre-Anesthesia Assessment:                        - Prior to the procedure, a History and Physical was                         performed, and patient medications and allergies were                         reviewed. The patient's tolerance of previous                         anesthesia was also reviewed. The risks and benefits                         of the procedure and the sedation options and risks                         were discussed with the patient. All questions were                         answered, and informed consent was obtained. Prior                         Anticoagulants: The patient has taken no previous                         anticoagulant or antiplatelet agents. ASA Grade                         Assessment: II - A patient with mild systemic disease.                         After reviewing the risks and benefits, the patient                         was deemed in satisfactory condition to undergo the                         procedure.                        After obtaining informed consent, the colonoscope was  passed under direct vision. Throughout the procedure,                         the patient's blood pressure, pulse, and oxygen                         saturations were monitored continuously. The                          Colonoscope was introduced through the anus and                         advanced to the the cecum, identified by appendiceal                         orifice and ileocecal valve. The colonoscopy was                         performed without difficulty. The patient tolerated                         the procedure well. The quality of the bowel                         preparation was excellent. Findings:      The perianal and digital rectal examinations were normal.      Two sessile polyps were found in the descending colon. The polyps were 2       to 5 mm in size. These polyps were removed with a cold snare. Resection       and retrieval were complete.      Non-bleeding internal hemorrhoids were found during retroflexion. The       hemorrhoids were Grade I (internal hemorrhoids that do not prolapse). Impression:            - Two 2 to 5 mm polyps in the descending colon,                         removed with a cold snare. Resected and retrieved.                        - Non-bleeding internal hemorrhoids. Recommendation:        - Discharge patient to home.                        - Resume previous diet.                        - Continue present medications.                        - Await pathology results.                        - Repeat colonoscopy is not recommended for                         surveillance. Procedure Code(s):     --- Professional ---  45385, Colonoscopy, flexible; with removal of                         tumor(s), polyp(s), or other lesion(s) by snare                         technique Diagnosis Code(s):     --- Professional ---                        Z12.11, Encounter for screening for malignant neoplasm                         of colon                        K63.5, Polyp of colon CPT copyright 2019 American Medical Association. All rights reserved. The codes documented in this report are preliminary and upon coder review may  be revised to meet  current compliance requirements. [name] MD, MD 05/05/2022 9:12:27 AM This report has been signed electronically. Number of Addenda: 0 Note Initiated On: 05/05/2022 8:52 AM Scope Withdrawal Time: 0 hours 7 minutes 37 seconds  Total Procedure Duration: 0 hours 12 minutes 42 seconds  Estimated Blood Loss:  Estimated blood loss: none.      [facility]

## 2022-05-05 NOTE — Anesthesia Preprocedure Evaluation (Addendum)
Anesthesia Evaluation  Patient identified by MRN, date of birth, ID band Patient awake    Reviewed: Allergy & Precautions, NPO status , Patient's Chart, lab work & pertinent test results  History of Anesthesia Complications Negative for: history of anesthetic complications  Airway Mallampati: III  TM Distance: >3 FB Neck ROM: full    Dental  (+) Missing, Partial Lower   Pulmonary sleep apnea , COPD, former smoker,    Pulmonary exam normal        Cardiovascular Exercise Tolerance: Good hypertension, (-) anginaNormal cardiovascular exam+ dysrhythmias      Neuro/Psych negative neurological ROS  negative psych ROS   GI/Hepatic Neg liver ROS, GERD  ,  Endo/Other  negative endocrine ROS  Renal/GU negative Renal ROS  negative genitourinary   Musculoskeletal  (+) Arthritis ,   Abdominal Normal abdominal exam  (+)   Peds  Hematology negative hematology ROS (+)   Anesthesia Other Findings Arthritis  Knees  COPD    Does use hearing aid  Enlarged prostate    Hypercholesterolemia       Reproductive/Obstetrics negative OB ROS                            Anesthesia Physical  Anesthesia Plan  ASA: 3  Anesthesia Plan: General   Post-op Pain Management:    Induction: Intravenous  PONV Risk Score and Plan: 2 and Propofol infusion  Airway Management Planned: Natural Airway  Additional Equipment:   Intra-op Plan:   Post-operative Plan:   Informed Consent: I have reviewed the patients History and Physical, chart, labs and discussed the procedure including the risks, benefits and alternatives for the proposed anesthesia with the patient or authorized representative who has indicated his/her understanding and acceptance.     Dental Advisory Given  Plan Discussed with: Anesthesiologist, CRNA and Surgeon  Anesthesia Plan Comments: (Patient consented for risks of anesthesia including but  not limited to:  - adverse reactions to medications - risk of airway placement if required - damage to eyes, teeth, lips or other oral mucosa - nerve damage due to positioning  - sore throat or hoarseness - Damage to heart, brain, nerves, lungs, other parts of body or loss of life  Patient voiced understanding.)       Anesthesia Quick Evaluation

## 2022-05-05 NOTE — Transfer of Care (Signed)
Immediate Anesthesia Transfer of Care Note  Patient: Joshua Mercado  Procedure(s) Performed: COLONOSCOPY WITH PROPOFOL  Patient Location: Endoscopy Unit  Anesthesia Type:General  Level of Consciousness: drowsy and patient cooperative  Airway & Oxygen Therapy: Patient Spontanous Breathing and Patient connected to nasal cannula oxygen  Post-op Assessment: Report given to RN and Post -op Vital signs reviewed and stable  Post vital signs: Reviewed and stable  Last Vitals:  Vitals Value Taken Time  BP 85/56 05/05/22 0915  Temp    Pulse 64 05/05/22 0916  Resp 22 05/05/22 0916  SpO2 99 % 05/05/22 0916  Vitals shown include unvalidated device data.  Last Pain:  Vitals:   05/05/22 0833  TempSrc: Temporal  PainSc: 0-No pain         Complications: No notable events documented.

## 2022-05-05 NOTE — Anesthesia Postprocedure Evaluation (Signed)
Anesthesia Post Note  Patient: Joshua Mercado  Procedure(s) Performed: COLONOSCOPY WITH PROPOFOL  Patient location during evaluation: PACU Anesthesia Type: General Level of consciousness: awake and alert Pain management: pain level controlled Vital Signs Assessment: post-procedure vital signs reviewed and stable Respiratory status: spontaneous breathing, nonlabored ventilation and respiratory function stable Cardiovascular status: blood pressure returned to baseline and stable Postop Assessment: no apparent nausea or vomiting Anesthetic complications: no   No notable events documented.   Last Vitals:  Vitals:   05/05/22 0925 05/05/22 0935  BP: 102/85 124/79  Pulse: (!) 51 (!) 44  Resp: 20 15  Temp:    SpO2: 96% 96%    Last Pain:  Vitals:   05/05/22 0935  TempSrc:   PainSc: 0-No pain                 [place] Ouch

## 2022-05-06 ENCOUNTER — Encounter: Payer: Self-pay | Admitting: Gastroenterology

## 2022-05-06 LAB — SURGICAL PATHOLOGY

## 2022-05-08 ENCOUNTER — Ambulatory Visit: Payer: Medicare Other | Admitting: Adult Health

## 2022-05-08 ENCOUNTER — Encounter: Payer: Self-pay | Admitting: Adult Health

## 2022-05-08 ENCOUNTER — Other Ambulatory Visit: Payer: Self-pay

## 2022-05-08 ENCOUNTER — Ambulatory Visit (INDEPENDENT_AMBULATORY_CARE_PROVIDER_SITE_OTHER): Payer: Medicare Other

## 2022-05-08 VITALS — BP 138/68 | HR 54 | Temp 97.7°F | Ht 73.0 in | Wt 212.8 lb

## 2022-05-08 DIAGNOSIS — G4733 Obstructive sleep apnea (adult) (pediatric): Secondary | ICD-10-CM | POA: Diagnosis not present

## 2022-05-08 DIAGNOSIS — J449 Chronic obstructive pulmonary disease, unspecified: Secondary | ICD-10-CM

## 2022-05-08 DIAGNOSIS — J439 Emphysema, unspecified: Secondary | ICD-10-CM | POA: Diagnosis not present

## 2022-05-08 MED ORDER — ALBUTEROL SULFATE HFA 108 (90 BASE) MCG/ACT IN AERS: 2.0000 | INHALATION_SPRAY | Freq: Four times a day (QID) | RESPIRATORY_TRACT | 2 refills | Status: DC | PRN

## 2022-05-08 MED ORDER — SPIRIVA RESPIMAT 2.5 MCG/ACT IN AERS: 2.0000 | INHALATION_SPRAY | Freq: Every day | RESPIRATORY_TRACT | 5 refills | Status: DC

## 2022-05-08 NOTE — Assessment & Plan Note (Signed)
Mild to moderate sleep apnea-CPAP and BiPAP intolerant.  Patient has recently had deviated septum repair last year.  Seems to be have improved symptom.  He is having a home sleep study per ENT.  Plan Patient Instructions  Chest xray today  Continue on Spiriva 2 puffs daily  Albuterol inhaler or neb As needed   Activity as tolerated.  Do not drive if sleepy  Follow up with Dr. Halford Chessman  in 1 year in [place] and As needed

## 2022-05-08 NOTE — Assessment & Plan Note (Signed)
Controlled on current regimen.  Plan  Patient Instructions  Chest xray today  Continue on Spiriva 2 puffs daily  Albuterol inhaler or neb As needed   Activity as tolerated.  Do not drive if sleepy  Follow up with Dr. Halford Chessman  in 1 year in [place] and As needed

## 2022-05-08 NOTE — Progress Notes (Signed)
$'@Patient'v$  ID: Joshua Mercado, male    DOB: 1946-02-24, 76 y.o.   MRN: [id number]  Chief Complaint  Patient presents with   Follow-up    Referring provider: Lauree Chandler, NP  HPI: 76 year old male former smoker followed for COPD and sleep apnea- CPAP intolerant   TEST/EVENTS :  PFT 10/31/19 >> FEV1 2.84 (85%), FEV1% 69, TLC 7.45 (97%), DLCO 74%, +BD   Chest Imaging:  CT chest 01/10/14 >> apical scarring, old granulomas, trace BTX in Rt base   Sleep Tests:  HST 02/06/16 >> AHI 16.6, SpO2 low 84% PSG 11/18/17 >> AHI 11.1, SpO2 low 87%, Bipap 17/13 cm H2O  05/08/2022 Follow up : COPD and OSA Patient returns for follow-up visit.  Last seen September 2021.  Patient has underlying COPD.  Says overall breathing is doing okay.  He remains on Spiriva daily.  Needs refills of Spiriva and albuterol.  Says he rarely uses his albuterol.  Patient denies any chest pain orthopnea PND or leg swelling.  Patient does have history of mild to moderate sleep apnea.  Has been CPAP and BiPAP intolerant.  Patient has had deviated septum repair last year.  Says that he has been set up for a repeat home sleep study by his ENT.  Feels that he is not as sleepy and does not feel as tired.  Also no more snoring.   Allergies  Allergen Reactions   Bee Pollen Anaphylaxis    Bee Venom   Bee Venom     Throat/mouth swelling    Immunization History  Administered Date(s) Administered   Influenza, High Dose Seasonal PF 04/11/2015, 04/15/2016   Influenza-Unspecified 05/14/2019, 05/02/2020, 04/24/2021, 04/10/2022   Moderna Covid-19 Vaccine Bivalent Booster 82yr & up 04/22/2021   Moderna Sars-Covid-2 Vaccination 08/16/2019, 09/13/2019, 05/28/2020, 11/28/2020   Pneumococcal Conjugate-13 04/13/2015, 05/13/2015   Pneumococcal Polysaccharide-23 11/24/2012   Tdap 12/11/2016   Zoster, Live 06/13/2007    Past Medical History:  Diagnosis Date   Arthritis    Knees   [place]     Does use hearing aid    Bilateral   Enlarged prostate    GERD (gastroesophageal reflux disease)    Hypercholesterolemia     Tobacco History: Social History   Tobacco Use  Smoking Status Former   Packs/day: 1.00   Years: 35.00   Total pack years: 35.00   Types: Cigarettes   Quit date: 1985   Years since quitting: 38.8  Smokeless Tobacco Never   Counseling given: Not Answered   Outpatient Medications Prior to Visit  Medication Sig Dispense Refill   acetaminophen (TYLENOL) 500 MG tablet Take 500 mg by mouth as needed.     Apoaequorin (PREVAGEN) 10 MG CAPS Take 1 capsule by mouth daily.      Ascorbic Acid (VITAMIN C) 1000 MG tablet Take 1,000 mg by mouth daily.     Cholecalciferol (VITAMIN D3) 125 MCG (5000 UT) CAPS Take by mouth.     clotrimazole-betamethasone (LOTRISONE) cream Apply 1 Application topically 2 (two) times daily as needed.     cyclobenzaprine (FLEXERIL) 10 MG tablet TAKE 1 TABLET BY MOUTH  TWICE DAILY AS NEEDED FOR  MUSCLE SPASMS 60 tablet 3   fexofenadine (ALLEGRA) 180 MG tablet Take 180 mg by mouth daily as needed.     finasteride (PROSCAR) 5 MG tablet TAKE 1 TABLET BY MOUTH  DAILY 100 tablet 2   fluticasone (FLONASE) 50 MCG/ACT nasal spray USE 1 SPRAY IN BOTH  NOSTRILS DAILY  32 g 3   melatonin 5 MG TABS Take 5 mg by mouth at bedtime.      Methylsulfonylmethane (MSM) 1000 MG CAPS Take 1 capsule by mouth 2 (two) times daily.     Multiple Vitamins-Minerals (MULTIVITAMIN MEN 50+) TABS Take 1 tablet by mouth daily.     nabumetone (RELAFEN) 750 MG tablet TAKE 1 TABLET BY MOUTH  TWICE DAILY AS NEEDED 180 tablet 3   oxybutynin (DITROPAN XL) 10 MG 24 hr tablet Take 10 mg by mouth as needed.     pantoprazole (PROTONIX) 40 MG tablet Take 1 tablet (40 mg total) by mouth daily. 90 tablet 1   selenium 200 MCG TABS tablet Take by mouth daily.     simvastatin (ZOCOR) 40 MG tablet TAKE 1 TABLET BY MOUTH  DAILY 90 tablet 3   tamsulosin (FLOMAX) 0.4 MG CAPS capsule TAKE 1  CAPSULE BY MOUTH  DAILY 100 capsule 2   temazepam (RESTORIL) 15 MG capsule TAKE 1 CAPSULE BY MOUTH AT  BEDTIME AS NEEDED FOR SLEEP 30 capsule 5   Testosterone 2 MG/24HR PT24 Apply 1 patch daily 90 patch 1   Tiotropium Bromide Monohydrate (SPIRIVA RESPIMAT) 2.5 MCG/ACT AERS Inhale 2 puffs into the lungs daily. (Patient taking differently: Inhale 2 puffs into the lungs daily as needed.) 12 g 2   triamcinolone cream (KENALOG) 0.1 % APPLY  CREAM EXTERNALLY TO AFFECTED AREA TWICE DAILY 80 g 1   vitamin B-12 (CYANOCOBALAMIN) 500 MCG tablet Take 500 mcg by mouth daily.     No facility-administered medications prior to visit.     Review of Systems:   Constitutional:   No  weight loss, night sweats,  Fevers, chills, fatigue, or  lassitude.  HEENT:   No headaches,  Difficulty swallowing,  Tooth/dental problems, or  Sore throat,                No sneezing, itching, ear ache, nasal congestion, post nasal drip,   CV:  No chest pain,  Orthopnea, PND, swelling in lower extremities, anasarca, dizziness, palpitations, syncope.   GI  No heartburn, indigestion, abdominal pain, nausea, vomiting, diarrhea, change in bowel habits, loss of appetite, bloody stools.   Resp:   No chest wall deformity  Skin: no rash or lesions.  GU: no dysuria, change in color of urine, no urgency or frequency.  No flank pain, no hematuria   MS:  No joint pain or swelling.  No decreased range of motion.  No back pain.    Physical Exam  BP 138/68 (BP Location: Left Arm, Patient Position: Sitting, Cuff Size: Large)   Pulse (!) 54   Temp 97.7 F (36.5 C) (Oral)   Ht '6\' 1"'$  (1.854 m)   Wt 212 lb 12.8 oz (96.5 kg)   SpO2 95%   BMI 28.08 kg/m   GEN: A/Ox3; pleasant , NAD, well nourished    HEENT:  [place]/AT,   NOSE-clear, THROAT-clear, no lesions, no postnasal drip or exudate noted.   NECK:  Supple w/ fair ROM; no JVD; normal carotid impulses w/o bruits; no thyromegaly or nodules palpated; no lymphadenopathy.    RESP   Clear  P & A; w/o, wheezes/ rales/ or rhonchi. no accessory muscle use, no dullness to percussion  CARD:  RRR, no m/r/g, no peripheral edema, pulses intact, no cyanosis or clubbing.  GI:   Soft & nt; nml bowel sounds; no organomegaly or masses detected.   Musco: Warm bil, no deformities or joint swelling noted.  Neuro: alert, no focal deficits noted.    Skin: Warm, no lesions or rashes    Lab Results:   BNP No results found for: "BNP"  ProBNP No results found for: "PROBNP"  Imaging: No results found.        No data to display          No results found for: "NITRICOXIDE"      Assessment & Plan:   Chronic obstructive pulmonary disease ([place]) Controlled on current regimen.  Plan  Patient Instructions  Chest xray today  Continue on Spiriva 2 puffs daily  Albuterol inhaler or neb As needed   Activity as tolerated.  Do not drive if sleepy  Follow up with Dr. Halford Chessman  in 1 year in [place] and As needed       Moderate obstructive sleep apnea Mild to moderate sleep apnea-CPAP and BiPAP intolerant.  Patient has recently had deviated septum repair last year.  Seems to be have improved symptom.  He is having a home sleep study per ENT.  Plan Patient Instructions  Chest xray today  Continue on Spiriva 2 puffs daily  Albuterol inhaler or neb As needed   Activity as tolerated.  Do not drive if sleepy  Follow up with Dr. Halford Chessman  in 1 year in [place] and As needed         Rexene Edison, NP 05/08/2022

## 2022-05-08 NOTE — Patient Instructions (Signed)
Chest xray today  Continue on Spiriva 2 puffs daily  Albuterol inhaler or neb As needed   Activity as tolerated.  Do not drive if sleepy  Follow up with Dr. Halford Chessman  in 1 year in [place] and As needed

## 2022-05-12 ENCOUNTER — Telehealth: Payer: Self-pay | Admitting: Adult Health

## 2022-05-12 NOTE — Progress Notes (Signed)
ATC x1.  No answer.  LVM to return call.

## 2022-05-12 NOTE — Progress Notes (Signed)
Reviewed and agree with assessment/plan.   Chesley Mires, MD [facility] Pulmonary/Critical Care 05/12/2022, 10:35 AM Pager:  [phone]

## 2022-05-13 ENCOUNTER — Telehealth: Payer: Self-pay

## 2022-05-13 ENCOUNTER — Other Ambulatory Visit (HOSPITAL_COMMUNITY): Payer: Self-pay

## 2022-05-13 NOTE — Telephone Encounter (Signed)
Hey Joshua Mercado, this Pt would like his results from his chest xray yesterday. Please advise. Thank you.

## 2022-05-13 NOTE — Telephone Encounter (Signed)
[facility]  PA submitted and awaiting determination.   Key: [id number]

## 2022-05-13 NOTE — Telephone Encounter (Signed)
Please see chest x-ray results that were called yesterday and voicemail was left

## 2022-05-13 NOTE — Telephone Encounter (Signed)
PA received for Albuterol 18g HFA (Ventolin) through [facility] in [place].  Per benefits investigation PA is not needed for Albuterol 8.5g HFA (generic ProAir).  Please send in new prescription if appropriate or advise is PA is needed.  Key: [id number]

## 2022-05-13 NOTE — Telephone Encounter (Signed)
Spoke with pt and reviewed CXR results as dictated by Tammy. Pt stated understanding. Nothing further needed at this time.

## 2022-05-14 ENCOUNTER — Ambulatory Visit: Payer: Medicare Other | Admitting: Adult Health

## 2022-05-14 ENCOUNTER — Other Ambulatory Visit (HOSPITAL_COMMUNITY): Payer: Self-pay

## 2022-05-14 NOTE — Telephone Encounter (Signed)
Spiriva Respimat is APPROVED. Per test claim has been filled 10/31.

## 2022-05-14 NOTE — Telephone Encounter (Signed)
That is fine for generic proAir

## 2022-05-15 MED ORDER — ALBUTEROL SULFATE HFA 108 (90 BASE) MCG/ACT IN AERS: 2.0000 | INHALATION_SPRAY | Freq: Four times a day (QID) | RESPIRATORY_TRACT | 2 refills | Status: DC | PRN

## 2022-05-15 NOTE — Addendum Note (Signed)
Addended by: Darliss Ridgel on: 05/15/2022 09:48 AM   Modules accepted: Orders

## 2022-05-15 NOTE — Telephone Encounter (Signed)
Refill sent to pharmacy for Albuterol 8.5g HFA (generic ProAir). Nothing further

## 2022-05-18 ENCOUNTER — Other Ambulatory Visit: Payer: Self-pay

## 2022-05-18 ENCOUNTER — Other Ambulatory Visit: Payer: Self-pay | Admitting: Nurse Practitioner

## 2022-05-18 DIAGNOSIS — J449 Chronic obstructive pulmonary disease, unspecified: Secondary | ICD-10-CM

## 2022-05-18 MED ORDER — PANTOPRAZOLE SODIUM 40 MG PO TBEC: 40.0000 mg | DELAYED_RELEASE_TABLET | Freq: Every day | ORAL | 1 refills | Status: DC

## 2022-05-18 NOTE — Progress Notes (Signed)
ATC x2.  LVM to return call.

## 2022-05-19 ENCOUNTER — Telehealth: Payer: Self-pay | Admitting: Adult Health

## 2022-05-19 NOTE — Telephone Encounter (Signed)
Called and spoke to patient about chest xray. Patient verbalized understanding. Nothing further needed

## 2022-05-25 NOTE — Progress Notes (Signed)
Called and spoke to patient about chest xray. Patient verbalized understanding. Nothing further needed     Electronically signed by Monna Fam L, RN at 05/19/2022  1:56 PM

## 2022-05-27 DIAGNOSIS — G4733 Obstructive sleep apnea (adult) (pediatric): Secondary | ICD-10-CM | POA: Diagnosis not present

## 2022-06-19 DIAGNOSIS — I7090 Unspecified atherosclerosis: Secondary | ICD-10-CM | POA: Diagnosis not present

## 2022-06-19 DIAGNOSIS — I6523 Occlusion and stenosis of bilateral carotid arteries: Secondary | ICD-10-CM | POA: Diagnosis not present

## 2022-06-23 DIAGNOSIS — G4733 Obstructive sleep apnea (adult) (pediatric): Secondary | ICD-10-CM | POA: Diagnosis not present

## 2022-06-23 DIAGNOSIS — G43109 Migraine with aura, not intractable, without status migrainosus: Secondary | ICD-10-CM | POA: Diagnosis not present

## 2022-06-23 DIAGNOSIS — M542 Cervicalgia: Secondary | ICD-10-CM | POA: Diagnosis not present

## 2022-07-11 ENCOUNTER — Other Ambulatory Visit: Payer: Self-pay | Admitting: Nurse Practitioner

## 2022-07-11 DIAGNOSIS — R252 Cramp and spasm: Secondary | ICD-10-CM

## 2022-07-11 DIAGNOSIS — F5101 Primary insomnia: Secondary | ICD-10-CM

## 2022-07-15 ENCOUNTER — Other Ambulatory Visit: Payer: Self-pay | Admitting: *Deleted

## 2022-07-15 DIAGNOSIS — J449 Chronic obstructive pulmonary disease, unspecified: Secondary | ICD-10-CM

## 2022-07-15 MED ORDER — SPIRIVA RESPIMAT 2.5 MCG/ACT IN AERS: 2.0000 | INHALATION_SPRAY | Freq: Every day | RESPIRATORY_TRACT | 3 refills | Status: DC

## 2022-07-15 MED ORDER — ALBUTEROL SULFATE HFA 108 (90 BASE) MCG/ACT IN AERS: 2.0000 | INHALATION_SPRAY | Freq: Four times a day (QID) | RESPIRATORY_TRACT | 3 refills | Status: DC | PRN

## 2022-07-27 ENCOUNTER — Telehealth: Payer: Medicare Other

## 2022-07-27 DIAGNOSIS — J302 Other seasonal allergic rhinitis: Secondary | ICD-10-CM

## 2022-07-27 DIAGNOSIS — F5101 Primary insomnia: Secondary | ICD-10-CM

## 2022-07-27 MED ORDER — FLUTICASONE PROPIONATE 50 MCG/ACT NA SUSP: NASAL | 3 refills | Status: DC

## 2022-07-27 MED ORDER — TEMAZEPAM 15 MG PO CAPS: ORAL_CAPSULE | ORAL | 1 refills | Status: DC

## 2022-07-27 NOTE — Telephone Encounter (Signed)
Another fax received from [facility] for Temazepam. Will await reply from patient

## 2022-07-27 NOTE — Telephone Encounter (Signed)
Left message on voicemail for patient to return call when available   Reason for call: Incoming fax received from [facility], which is not on patients active pharmacy list, requesting a refill for Fluticasone  Awaiting for a return call

## 2022-07-27 NOTE — Telephone Encounter (Signed)
Patient returned call and confirmed that [facility] is his new mail order pharmacy and he will no longer use [facility]   Treatment agreement outdated for Temazepam, notation made on pending appointment for 08/11/22

## 2022-07-27 NOTE — Telephone Encounter (Signed)
Rx sent 

## 2022-07-28 ENCOUNTER — Other Ambulatory Visit: Payer: Self-pay | Admitting: *Deleted

## 2022-07-28 MED ORDER — OXYBUTYNIN CHLORIDE ER 10 MG PO TB24: 10.0000 mg | ORAL_TABLET | Freq: Every day | ORAL | 1 refills | Status: DC

## 2022-07-28 NOTE — Telephone Encounter (Signed)
[facility] requested refill.  

## 2022-07-30 ENCOUNTER — Other Ambulatory Visit: Payer: Self-pay | Admitting: *Deleted

## 2022-07-30 DIAGNOSIS — N401 Enlarged prostate with lower urinary tract symptoms: Secondary | ICD-10-CM

## 2022-07-30 DIAGNOSIS — M170 Bilateral primary osteoarthritis of knee: Secondary | ICD-10-CM

## 2022-07-30 MED ORDER — TAMSULOSIN HCL 0.4 MG PO CAPS: 0.4000 mg | ORAL_CAPSULE | Freq: Every day | ORAL | 1 refills | Status: DC

## 2022-07-30 MED ORDER — NABUMETONE 750 MG PO TABS: 750.0000 mg | ORAL_TABLET | Freq: Two times a day (BID) | ORAL | 1 refills | Status: DC | PRN

## 2022-07-30 MED ORDER — FINASTERIDE 5 MG PO TABS: 5.0000 mg | ORAL_TABLET | Freq: Every day | ORAL | 1 refills | Status: DC

## 2022-07-30 NOTE — Telephone Encounter (Signed)
[facility] requested refills.

## 2022-08-11 ENCOUNTER — Ambulatory Visit: Payer: Medicare PPO | Admitting: Nurse Practitioner

## 2022-08-18 ENCOUNTER — Ambulatory Visit: Payer: Medicare PPO | Admitting: Nurse Practitioner

## 2022-08-18 ENCOUNTER — Encounter: Payer: Self-pay | Admitting: Nurse Practitioner

## 2022-08-18 VITALS — BP 140/88 | HR 68 | Temp 98.0°F | Ht 73.0 in | Wt 208.0 lb

## 2022-08-18 DIAGNOSIS — R351 Nocturia: Secondary | ICD-10-CM

## 2022-08-18 DIAGNOSIS — M170 Bilateral primary osteoarthritis of knee: Secondary | ICD-10-CM

## 2022-08-18 DIAGNOSIS — Z8042 Family history of malignant neoplasm of prostate: Secondary | ICD-10-CM

## 2022-08-18 DIAGNOSIS — F5101 Primary insomnia: Secondary | ICD-10-CM

## 2022-08-18 DIAGNOSIS — J449 Chronic obstructive pulmonary disease, unspecified: Secondary | ICD-10-CM

## 2022-08-18 DIAGNOSIS — E782 Mixed hyperlipidemia: Secondary | ICD-10-CM

## 2022-08-18 DIAGNOSIS — J302 Other seasonal allergic rhinitis: Secondary | ICD-10-CM | POA: Diagnosis not present

## 2022-08-18 DIAGNOSIS — K219 Gastro-esophageal reflux disease without esophagitis: Secondary | ICD-10-CM

## 2022-08-18 DIAGNOSIS — N401 Enlarged prostate with lower urinary tract symptoms: Secondary | ICD-10-CM

## 2022-08-18 DIAGNOSIS — I10 Essential (primary) hypertension: Secondary | ICD-10-CM

## 2022-08-18 DIAGNOSIS — M542 Cervicalgia: Secondary | ICD-10-CM

## 2022-08-18 DIAGNOSIS — E349 Endocrine disorder, unspecified: Secondary | ICD-10-CM

## 2022-08-18 DIAGNOSIS — G4733 Obstructive sleep apnea (adult) (pediatric): Secondary | ICD-10-CM

## 2022-08-18 NOTE — Progress Notes (Signed)
Careteam: Patient Care Team: Lauree Chandler, NP as PCP - General (Geriatric Medicine) Kate Sable, MD as PCP - Cardiology (Cardiology)  Advanced Directive information Does Patient Have a Medical Advance Directive?: Yes, Type of Advance Directive: Out of facility DNR (pink MOST or yellow form), Pre-existing out of facility DNR order (yellow form or pink MOST form): Pink MOST form placed in chart (order not valid for inpatient use), Does patient want to make changes to medical advance directive?: No - Patient declined  Allergies  Allergen Reactions   Bee Pollen Anaphylaxis    Bee Venom   Bee Venom     Throat/mouth swelling    Chief Complaint  Patient presents with   Medical Management of Chronic Issues    Routine follow up. Patient needs to update treatment agreement. Patient would like to have blood work ordered.   Immunizations    Shingrix vaccine and  a COVID booster due.     HPI: Patient is a 77 y.o. male seen in today at the [facility] for routine follow up.   He was seen by neurosurgery for neck pain- did imaging and did thorough exam and did find some stenosis with some compression. He does not feel like pain was bad enough for intervention. Plans to follow up as needed.  He is stretching his neck and moving in a certain way to avoid pain.   COPD- spiriva daily and using albuterol 3 times a week for rescue. Reports overall COPD controlled.   OSA-has CPAP but does not use. Reports he will use it in an "emergency" when nasal congestion occurs.  He is going to see ENT on the 15th, wants to discuss options.  Using strips and saline to help with congestion and allergies.   Hyperlipidemia- using zocor- has supple at home- his prescription is for 40 mg but he only is taking half tablet every other day.   OAB- not using oxybutynin   OA- using nabumetone for muscle and joint pains.  Has used tylenol in the past but has stopped.   BPH- proscar and flomax-  controlled on medication, no worsening of symptoms. follow up PSA in the summer- last in July was normal.  Review of Systems:  Review of Systems  Constitutional:  Negative for chills, fever and weight loss.  HENT:  Negative for tinnitus.   Respiratory:  Negative for cough, sputum production and shortness of breath.   Cardiovascular:  Negative for chest pain, palpitations and leg swelling.  Gastrointestinal:  Negative for abdominal pain, constipation, diarrhea and heartburn.  Genitourinary:  Negative for dysuria, frequency and urgency.  Musculoskeletal:  Positive for joint pain, myalgias and neck pain. Negative for back pain and falls.  Skin: Negative.   Neurological:  Negative for dizziness and headaches.  Psychiatric/Behavioral:  Negative for depression and memory loss. The patient does not have insomnia.     Past Medical History:  Diagnosis Date   Arthritis    Knees   COPD (chronic obstructive pulmonary disease) ([place])    Does use hearing aid    Bilateral   Enlarged prostate    GERD (gastroesophageal reflux disease)    Hypercholesterolemia    Past Surgical History:  Procedure Laterality Date   CHEEK AUGMENTATION Left 1970   REstructure of cheek bone    COLONOSCOPY  2017   Dr. Madolyn Frieze   COLONOSCOPY WITH PROPOFOL N/A 05/05/2022   Procedure: COLONOSCOPY WITH PROPOFOL;  Surgeon: [name], MD;  Location: [facility] ENDOSCOPY;  Service:  Endoscopy;  Laterality: N/A;   ESOPHAGOGASTRODUODENOSCOPY (EGD) WITH PROPOFOL N/A 12/05/2020   Procedure: ESOPHAGOGASTRODUODENOSCOPY (EGD) WITH PROPOFOL;  Surgeon: [name], MD;  Location: [facility] ENDOSCOPY;  Service: Endoscopy;  Laterality: N/A;   EYE SURGERY Bilateral 2005   NASAL SEPTOPLASTY W/ TURBINOPLASTY Bilateral 12/24/2020   Procedure: NASAL SEPTOPLASTY WITH INFERIOR TURBINATE REDUCTION;  Surgeon: Carloyn Manner, MD;  Location: [facility] ORS;  Service: ENT;  Laterality: Bilateral;   UPPER GI ENDOSCOPY     Social History:   reports that he quit  smoking about 39 years ago. His smoking use included cigarettes. He has a 35.00 pack-year smoking history. He has never used smokeless tobacco. He reports that he does not currently use alcohol. He reports that he does not use drugs.  Family History  Problem Relation Age of Onset   Alzheimer's disease Mother    Diabetes Mother    Heart failure Father    Heart attack Father    Diabetes Father    Dementia Father     Medications: Patient's Medications  New Prescriptions   No medications on file  Previous Medications   ACETAMINOPHEN (TYLENOL) 500 MG TABLET    Take 500 mg by mouth as needed.   ALBUTEROL (VENTOLIN HFA) 108 (90 BASE) MCG/ACT INHALER    Inhale 2 puffs into the lungs every 6 (six) hours as needed for wheezing or shortness of breath.   APOAEQUORIN (PREVAGEN) 10 MG CAPS    Take 1 capsule by mouth daily.    ASCORBIC ACID (VITAMIN C) 1000 MG TABLET    Take 1,000 mg by mouth daily.   CHOLECALCIFEROL (VITAMIN D3) 125 MCG (5000 UT) CAPS    Take by mouth.   CLOTRIMAZOLE-BETAMETHASONE (LOTRISONE) CREAM    Apply 1 Application topically 2 (two) times daily as needed.   CYCLOBENZAPRINE (FLEXERIL) 10 MG TABLET    TAKE 1 TABLET BY MOUTH TWICE  DAILY AS NEEDED FOR MUSCLE  SPASM(S)   FEXOFENADINE (ALLEGRA) 180 MG TABLET    Take 180 mg by mouth daily as needed.   FINASTERIDE (PROSCAR) 5 MG TABLET    Take 1 tablet (5 mg total) by mouth daily.   FLUTICASONE (FLONASE) 50 MCG/ACT NASAL SPRAY    USE 1 SPRAY IN BOTH  NOSTRILS DAILY   MELATONIN 5 MG TABS    Take 5 mg by mouth at bedtime.    METHYLSULFONYLMETHANE (MSM) 1000 MG CAPS    Take 1 capsule by mouth 2 (two) times daily.   MULTIPLE VITAMINS-MINERALS (MULTIVITAMIN MEN 50+) TABS    Take 1 tablet by mouth daily.   NABUMETONE (RELAFEN) 750 MG TABLET    Take 1 tablet (750 mg total) by mouth 2 (two) times daily as needed.   OXYBUTYNIN (DITROPAN XL) 10 MG 24 HR TABLET    Take 1 tablet (10 mg total) by mouth at bedtime.   PANTOPRAZOLE (PROTONIX) 40 MG  TABLET    Take 1 tablet (40 mg total) by mouth daily.   SELENIUM 200 MCG TABS TABLET    Take by mouth daily.   SIMVASTATIN (ZOCOR) 40 MG TABLET    TAKE 1 TABLET BY MOUTH  DAILY   TAMSULOSIN (FLOMAX) 0.4 MG CAPS CAPSULE    Take 1 capsule (0.4 mg total) by mouth daily.   TEMAZEPAM (RESTORIL) 15 MG CAPSULE    TAKE 1 CAPSULE BY MOUTH AT  BEDTIME AS NEEDED FOR SLEEP   TESTOSTERONE 2 MG/24HR PT24    Apply 1 patch daily   TIOTROPIUM BROMIDE MONOHYDRATE (SPIRIVA RESPIMAT)  2.5 MCG/ACT AERS    Inhale 2 puffs into the lungs daily.   TRIAMCINOLONE CREAM (KENALOG) 0.1 %    APPLY  CREAM EXTERNALLY TO AFFECTED AREA TWICE DAILY   VITAMIN B-12 (CYANOCOBALAMIN) 500 MCG TABLET    Take 500 mcg by mouth daily.  Modified Medications   No medications on file  Discontinued Medications   No medications on file    Physical Exam:  Vitals:   08/18/22 1103  BP: (!) 140/88  Pulse: 68  Temp: 98 F (36.7 C)  SpO2: 96%  Weight: 208 lb (94.3 kg)  Height: '6\' 1"'$  (1.854 m)   Body mass index is 27.44 kg/m. Wt Readings from Last 3 Encounters:  08/18/22 208 lb (94.3 kg)  05/08/22 212 lb 12.8 oz (96.5 kg)  05/05/22 208 lb (94.3 kg)    Physical Exam Constitutional:      General: He is not in acute distress.    Appearance: He is well-developed. He is not diaphoretic.  HENT:     Head: Normocephalic and atraumatic.     Right Ear: External ear normal.     Left Ear: External ear normal.     Mouth/Throat:     Pharynx: No oropharyngeal exudate.  Eyes:     Conjunctiva/sclera: Conjunctivae normal.     Pupils: Pupils are equal, round, and reactive to light.  Cardiovascular:     Rate and Rhythm: Normal rate and regular rhythm.     Heart sounds: Normal heart sounds.  Pulmonary:     Effort: Pulmonary effort is normal.     Breath sounds: Normal breath sounds.  Abdominal:     General: Bowel sounds are normal.     Palpations: Abdomen is soft.  Musculoskeletal:        General: No tenderness.     Cervical back:  Normal range of motion and neck supple.     Right lower leg: No edema.     Left lower leg: No edema.  Skin:    General: Skin is warm and dry.  Neurological:     Mental Status: He is alert and oriented to person, place, and time.     Labs reviewed: Basic Metabolic Panel: Recent Labs    02/02/22 0000  NA 137  K 4.0  CL 107  CO2 24*  BUN 19  CREATININE 0.9  CALCIUM 9.6   Liver Function Tests: Recent Labs    02/02/22 0000  AST 18  ALT 18  ALKPHOS 60  ALBUMIN 4.3   No results for input(s): "LIPASE", "AMYLASE" in the last 8760 hours. No results for input(s): "AMMONIA" in the last 8760 hours. CBC: Recent Labs    02/02/22 0000  WBC 5.6  HGB 15.1  HCT 42  PLT 119*   Lipid Panel: Recent Labs    02/02/22 0000  CHOL 143  HDL 38  LDLCALC 83  TRIG 127   TSH: No results for input(s): "TSH" in the last 8760 hours. A1C: No results found for: "HGBA1C"   Assessment/Plan 1. Seasonal allergies -ongoing, has appt with ENT, continues on flonase and saline rinse.   2. Primary insomnia -stable on temazepam.   3. Essential hypertension -Blood pressure generally well controlled, goal bp <140/90 Continue current medications and dietary modifications follow metabolic panel  4. Cervicalgia -stable, continues with exercises and stretches  5. Chronic obstructive pulmonary disease, unspecified COPD type ([place]) -stable on spiriva, uses albuterol PRN  6. Gastroesophageal reflux disease without esophagitis -controlled on protonix 40 mg daily  7.  OSA (obstructive sleep apnea) -does not use CPAP, appt with ENT for other options.   8. Mixed hyperlipidemia -continues on lipitor- only taking 20 mg every other day, will follow up lipids  9. Testosterone deficiency -uses testosterone as needed, will follow up level.   10. BPH associated with nocturia -stable with flomax and proscar.   23. Family hx of prostate cancer -follow pSA yearly, stable at this time, due in  july  12. Primary osteoarthritis of both knees -stable, encouraged use of tylenol vs nabumetone, discuss risk/adverse risk of NSAIDs   Next appt: 6 months for AWV, 1 year  Suzanne Garbers K. [place], [facility] [phone]

## 2022-08-18 NOTE — Patient Instructions (Addendum)
Recommend to take tylenol over nabumetone  Okay to take 1000 mg of tylenol every 8 hours- 3000 mg of tylenol in 24 hours as needed for pain    Okay to come to clinic Thursday AM at 7;30 for blood work.

## 2022-08-27 LAB — CBC AND DIFFERENTIAL
HCT: 46 (ref 41–53)
Hemoglobin: 16.2 (ref 13.5–17.5)
Neutrophils Absolute: 2690
Platelets: 130 10*3/uL — AB (ref 150–400)
WBC: 5.7

## 2022-08-27 LAB — BASIC METABOLIC PANEL
BUN: 20 (ref 4–21)
CO2: 27 — AB (ref 13–22)
Chloride: 104 (ref 99–108)
Creatinine: 0.8 (ref 0.6–1.3)
Glucose: 96
Potassium: 4 mEq/L (ref 3.5–5.1)
Sodium: 138 (ref 137–147)

## 2022-08-27 LAB — COMPREHENSIVE METABOLIC PANEL
Albumin: 4.5 (ref 3.5–5.0)
Calcium: 9.9 (ref 8.7–10.7)
Globulin: 2.4
eGFR: 89

## 2022-08-27 LAB — HEPATIC FUNCTION PANEL
ALT: 20 U/L (ref 10–40)
AST: 20 (ref 14–40)
Alkaline Phosphatase: 65 (ref 25–125)
Bilirubin, Total: 1.7

## 2022-08-27 LAB — LIPID PANEL
Cholesterol: 189 (ref 0–200)
HDL: 37 (ref 35–70)
LDL Cholesterol: 120
Triglycerides: 199 — AB (ref 40–160)

## 2022-08-27 LAB — TESTOSTERONE: Testosterone: 711

## 2022-08-27 LAB — CBC: RBC: 5.06 (ref 3.87–5.11)

## 2022-09-01 ENCOUNTER — Encounter: Payer: Self-pay | Admitting: Nurse Practitioner

## 2022-09-03 ENCOUNTER — Other Ambulatory Visit: Payer: Self-pay | Admitting: *Deleted

## 2022-09-03 MED ORDER — PANTOPRAZOLE SODIUM 40 MG PO TBEC: 40.0000 mg | DELAYED_RELEASE_TABLET | Freq: Every day | ORAL | 1 refills | Status: DC

## 2022-09-03 NOTE — Telephone Encounter (Signed)
Caryl Pina with [facility] called requesting refills on patient's Albuterol Neb and Spiriva and Pantoprazole.   Refilled the Pantoprazole but informed rep to call patient's Pulmonologist for the breathing medications. Agreed.

## 2022-09-07 ENCOUNTER — Telehealth: Payer: Self-pay | Admitting: Nurse Practitioner

## 2022-09-07 NOTE — Telephone Encounter (Signed)
-----   Message from Albertina Senegal May, [place] sent at 09/01/2022  8:50 AM EST ----- Abstracted to patient's chart.

## 2022-09-07 NOTE — Telephone Encounter (Signed)
Cholesterol has worsened- he reported during visit he only takes zocor every other day- to make sure he is taking medication daily.  Blood counts stable. Platelets low but stable.  electrolytes, glucose, liver and kidney function are stable.

## 2022-09-08 ENCOUNTER — Other Ambulatory Visit: Payer: Self-pay | Admitting: *Deleted

## 2022-09-08 DIAGNOSIS — J449 Chronic obstructive pulmonary disease, unspecified: Secondary | ICD-10-CM

## 2022-09-08 MED ORDER — SPIRIVA RESPIMAT 2.5 MCG/ACT IN AERS: 2.0000 | INHALATION_SPRAY | Freq: Every day | RESPIRATORY_TRACT | 3 refills | Status: AC

## 2022-09-08 NOTE — Telephone Encounter (Signed)
LMOM for patient to return call.

## 2022-09-09 NOTE — Telephone Encounter (Signed)
Mychart message sent to patient as another avenue of communication

## 2022-09-09 NOTE — Telephone Encounter (Signed)
LMOM for patient to return call.

## 2022-09-10 ENCOUNTER — Ambulatory Visit: Payer: Medicare PPO | Admitting: Nurse Practitioner

## 2022-10-08 ENCOUNTER — Ambulatory Visit (SKILLED_NURSING_FACILITY): Payer: Medicare PPO | Admitting: Nurse Practitioner

## 2022-10-08 ENCOUNTER — Encounter: Payer: Self-pay | Admitting: Nurse Practitioner

## 2022-10-08 VITALS — BP 112/70 | HR 74 | Temp 98.4°F | Ht 73.0 in | Wt 198.2 lb

## 2022-10-08 DIAGNOSIS — J069 Acute upper respiratory infection, unspecified: Secondary | ICD-10-CM | POA: Diagnosis not present

## 2022-10-08 MED ORDER — ALBUTEROL SULFATE (2.5 MG/3ML) 0.083% IN NEBU: 2.5000 mg | INHALATION_SOLUTION | Freq: Four times a day (QID) | RESPIRATORY_TRACT | 0 refills | Status: AC | PRN

## 2022-10-08 NOTE — Patient Instructions (Addendum)
Netipot or saline wash daily for sinuses  Plain nasal saline spray throughout the day as needed May use tylenol 325 mg 2 tablets every 6 hours as needed aches and pains or sore throat humidifier in the home to help with the dry air Mucinex DM by mouth twice daily as needed for cough and chest congestion with full glass of water  Keep well hydrated Proper nutrition  Avoid forcefully blowing nose Vit c 1000 mg daily Vit d 2000 units daily  To use albuterol as needed for wheezing or shortness of breath or increase in cough

## 2022-10-08 NOTE — Progress Notes (Signed)
Careteam: Patient Care Team: Lauree Chandler, NP as PCP - General (Geriatric Medicine) Kate Sable, MD as PCP - Cardiology (Cardiology)  Advanced Directive information    Allergies  Allergen Reactions   Bee Pollen Anaphylaxis    Bee Venom   Bee Venom     Throat/mouth swelling    Chief Complaint  Patient presents with   Acute Visit    Since Monday evening, patient with cough, congestion, weakness, muscle soreness, and slight sore throat. Patient tried OTC cough syrup and it has helped some. Patient denies fever. Patient took an at home covid test yesterday and it was negative.      HPI: Patient is a 77 y.o. male seen in today at the [facility] for not feeling well for 3 days.  Reports he took home covid test and it was negative.  He reports he has not had a fever and has body aches.  Had had a scratchy throat.  He has had chest congestion and cough. Not coughing up much.  No wheezing.  No shortness of breath.  No ear pain.  Using OTC cough medication  Review of Systems:  Review of Systems  Constitutional:  Positive for chills and malaise/fatigue. Negative for fever and weight loss.  HENT:  Positive for sore throat. Negative for tinnitus.   Respiratory:  Positive for cough. Negative for sputum production and shortness of breath.   Cardiovascular:  Negative for chest pain, palpitations and leg swelling.  Gastrointestinal:  Negative for abdominal pain, constipation, diarrhea and heartburn.  Skin: Negative.   Neurological:  Negative for dizziness and headaches.    Past Medical History:  Diagnosis Date   Arthritis    Knees   COPD (chronic obstructive pulmonary disease) ([place])    Does use hearing aid    Bilateral   Enlarged prostate    GERD (gastroesophageal reflux disease)    Hypercholesterolemia    Past Surgical History:  Procedure Laterality Date   CHEEK AUGMENTATION Left 1970   REstructure of cheek bone    COLONOSCOPY  2017   Dr. Madolyn Frieze    COLONOSCOPY WITH PROPOFOL N/A 05/05/2022   Procedure: COLONOSCOPY WITH PROPOFOL;  Surgeon: [name], MD;  Location: [facility] ENDOSCOPY;  Service: Endoscopy;  Laterality: N/A;   ESOPHAGOGASTRODUODENOSCOPY (EGD) WITH PROPOFOL N/A 12/05/2020   Procedure: ESOPHAGOGASTRODUODENOSCOPY (EGD) WITH PROPOFOL;  Surgeon: [name], MD;  Location: [facility] ENDOSCOPY;  Service: Endoscopy;  Laterality: N/A;   EYE SURGERY Bilateral 2005   NASAL SEPTOPLASTY W/ TURBINOPLASTY Bilateral 12/24/2020   Procedure: NASAL SEPTOPLASTY WITH INFERIOR TURBINATE REDUCTION;  Surgeon: Carloyn Manner, MD;  Location: [facility] ORS;  Service: ENT;  Laterality: Bilateral;   UPPER GI ENDOSCOPY     Social History:   reports that he quit smoking about 39 years ago. His smoking use included cigarettes. He has a 35.00 pack-year smoking history. He has never used smokeless tobacco. He reports that he does not currently use alcohol. He reports that he does not use drugs.  Family History  Problem Relation Age of Onset   Alzheimer's disease Mother    Diabetes Mother    Heart failure Father    Heart attack Father    Diabetes Father    Dementia Father     Medications: Patient's Medications  New Prescriptions   No medications on file  Previous Medications   ACETAMINOPHEN (TYLENOL) 500 MG TABLET    Take 500 mg by mouth as needed.   ALBUTEROL (PROVENTIL) (2.5 MG/3ML) 0.083% NEBULIZER SOLUTION  Take 2.5 mg by nebulization every 6 (six) hours as needed for wheezing or shortness of breath.   APOAEQUORIN (PREVAGEN) 10 MG CAPS    Take 1 capsule by mouth daily.    ASCORBIC ACID (VITAMIN C) 1000 MG TABLET    Take 1,000 mg by mouth daily.   CHOLECALCIFEROL (VITAMIN D3) 125 MCG (5000 UT) CAPS    Take by mouth.   CLOTRIMAZOLE-BETAMETHASONE (LOTRISONE) CREAM    Apply 1 Application topically 2 (two) times daily as needed.   CYCLOBENZAPRINE (FLEXERIL) 10 MG TABLET    TAKE 1 TABLET BY MOUTH TWICE  DAILY AS NEEDED FOR MUSCLE  SPASM(S)   FEXOFENADINE  (ALLEGRA) 180 MG TABLET    Take 180 mg by mouth daily as needed.   FINASTERIDE (PROSCAR) 5 MG TABLET    Take 1 tablet (5 mg total) by mouth daily.   FLUTICASONE (FLONASE) 50 MCG/ACT NASAL SPRAY    USE 1 SPRAY IN BOTH  NOSTRILS DAILY   MELATONIN 5 MG TABS    Take 5 mg by mouth at bedtime.    METHYLSULFONYLMETHANE (MSM) 1000 MG CAPS    Take 1 capsule by mouth 2 (two) times daily.   MULTIPLE VITAMINS-MINERALS (MULTIVITAMIN MEN 50+) TABS    Take 1 tablet by mouth daily.   NABUMETONE (RELAFEN) 750 MG TABLET    Take 1 tablet (750 mg total) by mouth 2 (two) times daily as needed.   PANTOPRAZOLE (PROTONIX) 40 MG TABLET    Take 1 tablet (40 mg total) by mouth daily.   SELENIUM 200 MCG TABS TABLET    Take by mouth daily.   SIMVASTATIN (ZOCOR) 40 MG TABLET    TAKE 1 TABLET BY MOUTH  DAILY   TAMSULOSIN (FLOMAX) 0.4 MG CAPS CAPSULE    Take 1 capsule (0.4 mg total) by mouth daily.   TEMAZEPAM (RESTORIL) 15 MG CAPSULE    TAKE 1 CAPSULE BY MOUTH AT  BEDTIME AS NEEDED FOR SLEEP   TESTOSTERONE 2 MG/24HR PT24    Place 1 patch onto the skin as needed.   TIOTROPIUM BROMIDE MONOHYDRATE (SPIRIVA RESPIMAT) 2.5 MCG/ACT AERS    Inhale 2 puffs into the lungs daily.   TRIAMCINOLONE CREAM (KENALOG) 0.1 %    APPLY  CREAM EXTERNALLY TO AFFECTED AREA TWICE DAILY   VITAMIN B-12 (CYANOCOBALAMIN) 500 MCG TABLET    Take 500 mcg by mouth daily.  Modified Medications   No medications on file  Discontinued Medications   ALBUTEROL (VENTOLIN HFA) 108 (90 BASE) MCG/ACT INHALER    Inhale 2 puffs into the lungs every 6 (six) hours as needed for wheezing or shortness of breath.   TESTOSTERONE 2 MG/24HR PT24    Apply 1 patch daily    Physical Exam:  Vitals:   10/08/22 1040  BP: 112/70  Pulse: 74  Temp: 98.4 F (36.9 C)  TempSrc: Temporal  SpO2: 98%  Weight: 198 lb 4 oz (89.9 kg)  Height: 6\' 1"  (1.854 m)   Body mass index is 26.16 kg/m. Wt Readings from Last 3 Encounters:  10/08/22 198 lb 4 oz (89.9 kg)  08/18/22 208 lb  (94.3 kg)  05/08/22 212 lb 12.8 oz (96.5 kg)    Physical Exam Constitutional:      General: He is not in acute distress.    Appearance: He is well-developed. He is not diaphoretic.  HENT:     Head: Normocephalic and atraumatic.     Right Ear: External ear normal.     Left Ear: External  ear normal.     Mouth/Throat:     Pharynx: No oropharyngeal exudate.  Eyes:     Conjunctiva/sclera: Conjunctivae normal.     Pupils: Pupils are equal, round, and reactive to light.  Cardiovascular:     Rate and Rhythm: Normal rate and regular rhythm.     Heart sounds: Normal heart sounds.  Pulmonary:     Effort: Pulmonary effort is normal.     Breath sounds: Normal breath sounds.  Abdominal:     General: Bowel sounds are normal.     Palpations: Abdomen is soft.  Musculoskeletal:        General: No tenderness.     Cervical back: Normal range of motion and neck supple.     Right lower leg: No edema.     Left lower leg: No edema.  Skin:    General: Skin is warm and dry.  Neurological:     Mental Status: He is alert and oriented to person, place, and time.     Labs reviewed: Basic Metabolic Panel: Recent Labs    02/02/22 0000 08/27/22 0000  NA 137 138  K 4.0 4.0  CL 107 104  CO2 24* 27*  BUN 19 20  CREATININE 0.9 0.8  CALCIUM 9.6 9.9   Liver Function Tests: Recent Labs    02/02/22 0000 08/27/22 0000  AST 18 20  ALT 18 20  ALKPHOS 60 65  ALBUMIN 4.3 4.5   No results for input(s): "LIPASE", "AMYLASE" in the last 8760 hours. No results for input(s): "AMMONIA" in the last 8760 hours. CBC: Recent Labs    02/02/22 0000 08/27/22 0000  WBC 5.6 5.7  NEUTROABS  --  2,690.00  HGB 15.1 16.2  HCT 42 46  PLT 119* 130*   Lipid Panel: Recent Labs    02/02/22 0000 08/27/22 0000  CHOL 143 189  HDL 38 37  LDLCALC 83 120  TRIG 127 199*   TSH: No results for input(s): "TSH" in the last 8760 hours. A1C: No results found for: "HGBA1C"   Assessment/Plan 1. Acute  URI -symptom management at this time. Feeling better overall today but still with fatigue, myalgias, cough and congestion.  -Netipot or saline wash daily Plain nasal saline spray throughout the day as needed May use tylenol 325 mg 2 tablets every 6-8 hours as needed aches and pains or sore throat humidifier in the home to help with the dry air Mucinex DM by mouth twice daily as needed for cough and chest congestion with full glass of water  Keep well hydrated Proper nutrition  Avoid forcefully blowing nose Vit c 1000 mg daily Vit d 2000 units daily   Alima Naser K. [facility] [phone]

## 2022-10-14 DIAGNOSIS — H5213 Myopia, bilateral: Secondary | ICD-10-CM | POA: Diagnosis not present

## 2022-10-14 DIAGNOSIS — H52223 Regular astigmatism, bilateral: Secondary | ICD-10-CM | POA: Diagnosis not present

## 2022-10-14 DIAGNOSIS — Z961 Presence of intraocular lens: Secondary | ICD-10-CM | POA: Diagnosis not present

## 2022-10-14 DIAGNOSIS — H35373 Puckering of macula, bilateral: Secondary | ICD-10-CM | POA: Diagnosis not present

## 2022-10-22 ENCOUNTER — Ambulatory Visit (SKILLED_NURSING_FACILITY): Payer: Medicare PPO | Admitting: Nurse Practitioner

## 2022-10-22 ENCOUNTER — Encounter: Payer: Self-pay | Admitting: Nurse Practitioner

## 2022-10-22 VITALS — BP 148/92 | HR 60 | Temp 98.0°F | Ht 73.0 in | Wt 204.0 lb

## 2022-10-22 DIAGNOSIS — R058 Other specified cough: Secondary | ICD-10-CM

## 2022-10-22 NOTE — Progress Notes (Signed)
Careteam: Patient Care Team: Sharon SellerEubanks, Lelah Rennaker K, NP as PCP - General (Geriatric Medicine) Debbe OdeaAgbor-Etang, Brian, MD as PCP - Cardiology (Cardiology)  Advanced Directive information Does Patient Have a Medical Advance Directive?: Yes, Type of Advance Directive: Out of facility DNR (pink MOST or yellow form), Does patient want to make changes to medical advance directive?: No - Patient declined  Allergies  Allergen Reactions   Bee Pollen Anaphylaxis    Bee Venom   Bee Venom     Throat/mouth swelling    Chief Complaint  Patient presents with   Acute Visit    Cough. Requesting an X-ray     HPI: Patient is a 77 y.o. male [facility] ongoing cough.  He had URI at the end of March. He has a hx of COPD.  Coughs up some sputum.  Having some wheezing as well Overall feeling better.   Review of Systems:  Review of Systems  Constitutional:  Negative for chills, fever, malaise/fatigue and weight loss.  HENT:  Negative for tinnitus.   Respiratory:  Positive for cough, sputum production and wheezing. Negative for shortness of breath.   Cardiovascular:  Negative for chest pain, palpitations and leg swelling.  Gastrointestinal:  Negative for abdominal pain, constipation, diarrhea and heartburn.  Genitourinary:  Negative for dysuria, frequency and urgency.  Musculoskeletal:  Negative for back pain, falls, joint pain and myalgias.  Skin: Negative.   Neurological:  Negative for dizziness and headaches.  Psychiatric/Behavioral:  Negative for depression and memory loss. The patient does not have insomnia.     Past Medical History:  Diagnosis Date   Arthritis    Knees   COPD (chronic obstructive pulmonary disease)    Does use hearing aid    Bilateral   Enlarged prostate    GERD (gastroesophageal reflux disease)    Hypercholesterolemia    Past Surgical History:  Procedure Laterality Date   CHEEK AUGMENTATION Left 1970   REstructure of cheek bone     COLONOSCOPY  2017   Dr. Alphonsa OverallPapp   COLONOSCOPY WITH PROPOFOL N/A 05/05/2022   Procedure: COLONOSCOPY WITH PROPOFOL;  Surgeon: Midge MiniumWohl, Darren, MD;  [facility];  Service: Endoscopy;  Laterality: N/A;   ESOPHAGOGASTRODUODENOSCOPY (EGD) WITH PROPOFOL N/A 12/05/2020   Procedure: ESOPHAGOGASTRODUODENOSCOPY (EGD) WITH PROPOFOL;  Surgeon: Midge MiniumWohl, Darren, MD;  [facility];  Service: Endoscopy;  Laterality: N/A;   EYE SURGERY Bilateral 2005   NASAL SEPTOPLASTY W/ TURBINOPLASTY Bilateral 12/24/2020   Procedure: NASAL SEPTOPLASTY WITH INFERIOR TURBINATE REDUCTION;  Surgeon: Bud FaceVaught, Creighton, MD;  Location: [facility] ORS;  Service: ENT;  Laterality: Bilateral;   UPPER GI ENDOSCOPY     Social History:   reports that he quit smoking about 39 years ago. His smoking use included cigarettes. He has a 35.00 pack-year smoking history. He has never used smokeless tobacco. He reports that he does not currently use alcohol. He reports that he does not use drugs.  Family History  Problem Relation Age of Onset   Alzheimer's disease Mother    Diabetes Mother    Heart failure Father    Heart attack Father    Diabetes Father    Dementia Father     Medications: Patient's Medications  New Prescriptions   No medications on file  Previous Medications   ACETAMINOPHEN (TYLENOL) 500 MG TABLET    Take 500 mg by mouth as needed.   ALBUTEROL (PROVENTIL) (2.5 MG/3ML) 0.083% NEBULIZER SOLUTION    Take 3 mLs (  2.5 mg total) by nebulization every 6 (six) hours as needed for wheezing or shortness of breath.   APOAEQUORIN (PREVAGEN) 10 MG CAPS    Take 1 capsule by mouth daily.    ASCORBIC ACID (VITAMIN C) 1000 MG TABLET    Take 1,000 mg by mouth daily.   CHOLECALCIFEROL (VITAMIN D3) 125 MCG (5000 UT) CAPS    Take by mouth.   CLOTRIMAZOLE-BETAMETHASONE (LOTRISONE) CREAM    Apply 1 Application topically 2 (two) times daily as needed.   CYCLOBENZAPRINE (FLEXERIL) 10 MG TABLET    TAKE 1 TABLET BY MOUTH TWICE  DAILY  AS NEEDED FOR MUSCLE  SPASM(S)   FEXOFENADINE (ALLEGRA) 180 MG TABLET    Take 180 mg by mouth daily as needed.   FINASTERIDE (PROSCAR) 5 MG TABLET    Take 1 tablet (5 mg total) by mouth daily.   FLUTICASONE (FLONASE) 50 MCG/ACT NASAL SPRAY    USE 1 SPRAY IN BOTH  NOSTRILS DAILY   MELATONIN 5 MG TABS    Take 5 mg by mouth at bedtime.    METHYLSULFONYLMETHANE (MSM) 1000 MG CAPS    Take 1 capsule by mouth 2 (two) times daily.   MULTIPLE VITAMINS-MINERALS (MULTIVITAMIN MEN 50+) TABS    Take 1 tablet by mouth daily.   NABUMETONE (RELAFEN) 750 MG TABLET    Take 1 tablet (750 mg total) by mouth 2 (two) times daily as needed.   PANTOPRAZOLE (PROTONIX) 40 MG TABLET    Take 1 tablet (40 mg total) by mouth daily.   SELENIUM 200 MCG TABS TABLET    Take by mouth daily.   SIMVASTATIN (ZOCOR) 40 MG TABLET    TAKE 1 TABLET BY MOUTH  DAILY   TAMSULOSIN (FLOMAX) 0.4 MG CAPS CAPSULE    Take 1 capsule (0.4 mg total) by mouth daily.   TEMAZEPAM (RESTORIL) 15 MG CAPSULE    TAKE 1 CAPSULE BY MOUTH AT  BEDTIME AS NEEDED FOR SLEEP   TESTOSTERONE 2 MG/24HR PT24    Place 1 patch onto the skin as needed.   TIOTROPIUM BROMIDE MONOHYDRATE (SPIRIVA RESPIMAT) 2.5 MCG/ACT AERS    Inhale 2 puffs into the lungs daily.   TRIAMCINOLONE CREAM (KENALOG) 0.1 %    APPLY  CREAM EXTERNALLY TO AFFECTED AREA TWICE DAILY   VITAMIN B-12 (CYANOCOBALAMIN) 500 MCG TABLET    Take 500 mcg by mouth daily.  Modified Medications   No medications on file  Discontinued Medications   No medications on file    Physical Exam:  Vitals:   10/22/22 0818 10/22/22 0822  BP: (!) 148/96 (!) 148/92  Pulse: 60   Temp: 98 F (36.7 C)   SpO2: 95%   Weight: 204 lb (92.5 kg)   Height: 6\' 1"  (1.854 m)    Body mass index is 26.91 kg/m. Wt Readings from Last 3 Encounters:  10/22/22 204 lb (92.5 kg)  10/08/22 198 lb 4 oz (89.9 kg)  08/18/22 208 lb (94.3 kg)    Physical Exam Constitutional:      General: He is not in acute distress.    Appearance:  He is well-developed. He is not diaphoretic.  HENT:     Head: Normocephalic and atraumatic.     Right Ear: External ear normal.     Left Ear: External ear normal.     Mouth/Throat:     Pharynx: No oropharyngeal exudate.  Eyes:     Conjunctiva/sclera: Conjunctivae normal.     Pupils: Pupils are equal, round, and reactive  to light.  Cardiovascular:     Rate and Rhythm: Normal rate and regular rhythm.     Heart sounds: Normal heart sounds.  Pulmonary:     Effort: Pulmonary effort is normal.     Breath sounds: Normal breath sounds.  Abdominal:     General: Bowel sounds are normal.     Palpations: Abdomen is soft.  Musculoskeletal:        General: No tenderness.     Cervical back: Normal range of motion and neck supple.     Right lower leg: No edema.     Left lower leg: No edema.  Skin:    General: Skin is warm and dry.  Neurological:     Mental Status: He is alert and oriented to person, place, and time.     Labs reviewed: Basic Metabolic Panel: Recent Labs    02/02/22 0000 08/27/22 0000  NA 137 138  K 4.0 4.0  CL 107 104  CO2 24* 27*  BUN 19 20  CREATININE 0.9 0.8  CALCIUM 9.6 9.9   Liver Function Tests: Recent Labs    02/02/22 0000 08/27/22 0000  AST 18 20  ALT 18 20  ALKPHOS 60 65  ALBUMIN 4.3 4.5   No results for input(s): "LIPASE", "AMYLASE" in the last 8760 hours. No results for input(s): "AMMONIA" in the last 8760 hours. CBC: Recent Labs    02/02/22 0000 08/27/22 0000  WBC 5.6 5.7  NEUTROABS  --  2,690.00  HGB 15.1 16.2  HCT 42 46  PLT 119* 130*   Lipid Panel: Recent Labs    02/02/22 0000 08/27/22 0000  CHOL 143 189  HDL 38 37  LDLCALC 83 120  TRIG 127 199*   TSH: No results for input(s): "TSH" in the last 8760 hours. A1C: No results found for: "HGBA1C"   Assessment/Plan 1. Post-viral cough syndrome Viral illness 2 weeks ago still having some cough and congestion- pt with hx of COPD and smoking.  -recommend to continue mucinex  DM by mouth twice daily with full glass of water -to use albuterol PRN wheezing/cough or shortness of breath.  -consider chest xray for worsening of symptoms, persistent cough (over 6 weeks after viral illness)   Sneha Willig K. Biagio Borg  [facility] [phone]

## 2022-10-22 NOTE — Patient Instructions (Addendum)
Continue to use mucinex DM by mouth twice daily with full glass of water use routinely for the next few weeks.  Increase hydration.  Use albuterol as needed wheezing/cough/shortness of breath.

## 2022-11-09 ENCOUNTER — Other Ambulatory Visit: Payer: Self-pay | Admitting: Nurse Practitioner

## 2022-11-10 ENCOUNTER — Ambulatory Visit
Admission: RE | Admit: 2022-11-10 | Discharge: 2022-11-10 | Disposition: A | Discharge status: Home / Self Care | Payer: Medicare PPO | Source: Ambulatory Visit | Attending: Nurse Practitioner | Admitting: Nurse Practitioner

## 2022-11-10 ENCOUNTER — Ambulatory Visit
Admission: RE | Admit: 2022-11-10 | Discharge: 2022-11-10 | Disposition: A | Discharge status: Home / Self Care | Payer: Medicare PPO | Attending: Nurse Practitioner | Admitting: Nurse Practitioner

## 2022-11-10 ENCOUNTER — Ambulatory Visit: Payer: Medicare PPO | Admitting: Nurse Practitioner

## 2022-11-10 VITALS — BP 138/78 | HR 70 | Temp 98.0°F

## 2022-11-10 DIAGNOSIS — J9811 Atelectasis: Secondary | ICD-10-CM | POA: Diagnosis not present

## 2022-11-10 DIAGNOSIS — R059 Cough, unspecified: Secondary | ICD-10-CM | POA: Diagnosis not present

## 2022-11-10 DIAGNOSIS — F4321 Adjustment disorder with depressed mood: Secondary | ICD-10-CM | POA: Diagnosis not present

## 2022-11-10 DIAGNOSIS — R052 Subacute cough: Secondary | ICD-10-CM

## 2022-11-10 MED ORDER — PREDNISONE 10 MG (21) PO TBPK: ORAL_TABLET | ORAL | 0 refills | Status: DC

## 2022-11-10 MED ORDER — AZITHROMYCIN 250 MG PO TABS: ORAL_TABLET | ORAL | 0 refills | Status: AC

## 2022-11-10 NOTE — Patient Instructions (Addendum)
[address] [place],  [place]  [address]  For chest xray  Continue mucinex DM Prednisone as directed Azithromycin antibiotic given

## 2022-11-10 NOTE — Progress Notes (Signed)
Careteam: Patient Care Team: Sharon Seller, NP as PCP - General (Geriatric Medicine) Debbe Odea, MD as PCP - Cardiology (Cardiology)  Advanced Directive information    Allergies  Allergen Reactions   Bee Pollen Anaphylaxis    Bee Venom   Bee Venom     Throat/mouth swelling    Chief Complaint  Patient presents with   Acute Visit    Follow-up on coughing and lungs.      HPI: Patient is a 77 y.o. male seen in today at the [facility].   Wife passed overnight. Was there until 10 pm, she passed at midnight. Making plans for her now. Strept meningitis. He reports she was showing signs of early dementia prior to being sick and she would not want to live like that. He reports she was well taken care of in the hospital.   He has an ongoing cough- it is better but still productive. Questioning if he has bacterial infection.  No fever or chills.  No shortness of breath.  Using mucinex DM- continues with thick sputum and wheezing and ongoing.  Wheezing throughout the day- using albuterol Having to raise his bed to sleep   Review of Systems:  Review of Systems  Constitutional:  Negative for chills, fever, malaise/fatigue and weight loss.  HENT:  Negative for congestion, sore throat and tinnitus.   Respiratory:  Positive for cough, sputum production and wheezing. Negative for shortness of breath.   Cardiovascular:  Negative for chest pain, palpitations and leg swelling.  Gastrointestinal:  Negative for abdominal pain, constipation, diarrhea and heartburn.  Genitourinary:  Negative for dysuria, frequency and urgency.  Musculoskeletal:  Negative for back pain, falls, joint pain and myalgias.  Skin: Negative.   Neurological:  Negative for dizziness and headaches.  Psychiatric/Behavioral:  Negative for depression and memory loss. The patient has insomnia.     Past Medical History:  Diagnosis Date   Arthritis    Knees   COPD (chronic obstructive pulmonary  disease) (HCC)    Does use hearing aid    Bilateral   Enlarged prostate    GERD (gastroesophageal reflux disease)    Hypercholesterolemia    Past Surgical History:  Procedure Laterality Date   CHEEK AUGMENTATION Left 1970   REstructure of cheek bone    COLONOSCOPY  2017   Dr. Alphonsa Overall   COLONOSCOPY WITH PROPOFOL N/A 05/05/2022   Procedure: COLONOSCOPY WITH PROPOFOL;  Surgeon: Midge Minium, MD;  Location: [facility] ENDOSCOPY;  Service: Endoscopy;  Laterality: N/A;   ESOPHAGOGASTRODUODENOSCOPY (EGD) WITH PROPOFOL N/A 12/05/2020   Procedure: ESOPHAGOGASTRODUODENOSCOPY (EGD) WITH PROPOFOL;  Surgeon: Midge Minium, MD;  Location: [facility] ENDOSCOPY;  Service: Endoscopy;  Laterality: N/A;   EYE SURGERY Bilateral 2005   NASAL SEPTOPLASTY W/ TURBINOPLASTY Bilateral 12/24/2020   Procedure: NASAL SEPTOPLASTY WITH INFERIOR TURBINATE REDUCTION;  Surgeon: Bud Face, MD;  Location: [facility] ORS;  Service: ENT;  Laterality: Bilateral;   UPPER GI ENDOSCOPY     Social History:   reports that he quit smoking about 39 years ago. His smoking use included cigarettes. He has a 35.00 pack-year smoking history. He has never used smokeless tobacco. He reports that he does not currently use alcohol. He reports that he does not use drugs.  Family History  Problem Relation Age of Onset   Alzheimer's disease Mother    Diabetes Mother    Heart failure Father    Heart attack Father    Diabetes Father    Dementia Father  Medications: Patient's Medications  New Prescriptions   No medications on file  Previous Medications   ACETAMINOPHEN (TYLENOL) 500 MG TABLET    Take 500 mg by mouth as needed.   ALBUTEROL (PROVENTIL) (2.5 MG/3ML) 0.083% NEBULIZER SOLUTION    Take 3 mLs (2.5 mg total) by nebulization every 6 (six) hours as needed for wheezing or shortness of breath.   APOAEQUORIN (PREVAGEN) 10 MG CAPS    Take 1 capsule by mouth daily.    ASCORBIC ACID (VITAMIN C) 1000 MG TABLET    Take 1,000 mg by mouth daily.    CHOLECALCIFEROL (VITAMIN D3) 125 MCG (5000 UT) CAPS    Take by mouth.   CLOTRIMAZOLE-BETAMETHASONE (LOTRISONE) CREAM    Apply 1 Application topically 2 (two) times daily as needed.   CYCLOBENZAPRINE (FLEXERIL) 10 MG TABLET    TAKE 1 TABLET BY MOUTH TWICE  DAILY AS NEEDED FOR MUSCLE  SPASM(S)   FEXOFENADINE (ALLEGRA) 180 MG TABLET    Take 180 mg by mouth daily as needed.   FINASTERIDE (PROSCAR) 5 MG TABLET    Take 1 tablet (5 mg total) by mouth daily.   FLUTICASONE (FLONASE) 50 MCG/ACT NASAL SPRAY    USE 1 SPRAY IN BOTH  NOSTRILS DAILY   MELATONIN 5 MG TABS    Take 5 mg by mouth at bedtime.    METHYLSULFONYLMETHANE (MSM) 1000 MG CAPS    Take 1 capsule by mouth 2 (two) times daily.   MULTIPLE VITAMINS-MINERALS (MULTIVITAMIN MEN 50+) TABS    Take 1 tablet by mouth daily.   NABUMETONE (RELAFEN) 750 MG TABLET    Take 1 tablet (750 mg total) by mouth 2 (two) times daily as needed.   PANTOPRAZOLE (PROTONIX) 40 MG TABLET    Take 1 tablet (40 mg total) by mouth daily.   SELENIUM 200 MCG TABS TABLET    Take by mouth daily.   SIMVASTATIN (ZOCOR) 40 MG TABLET    TAKE 1 TABLET BY MOUTH  DAILY   TAMSULOSIN (FLOMAX) 0.4 MG CAPS CAPSULE    Take 1 capsule (0.4 mg total) by mouth daily.   TEMAZEPAM (RESTORIL) 15 MG CAPSULE    TAKE 1 CAPSULE BY MOUTH AT  BEDTIME AS NEEDED FOR SLEEP   TESTOSTERONE 2 MG/24HR PT24    Place 1 patch onto the skin as needed.   TIOTROPIUM BROMIDE MONOHYDRATE (SPIRIVA RESPIMAT) 2.5 MCG/ACT AERS    Inhale 2 puffs into the lungs daily.   TRIAMCINOLONE CREAM (KENALOG) 0.1 %    APPLY  CREAM EXTERNALLY TO AFFECTED AREA TWICE DAILY   VITAMIN B-12 (CYANOCOBALAMIN) 500 MCG TABLET    Take 500 mcg by mouth daily.  Modified Medications   No medications on file  Discontinued Medications   No medications on file    Physical Exam:  There were no vitals filed for this visit. There is no height or weight on file to calculate BMI. Wt Readings from Last 3 Encounters:  10/22/22 204 lb (92.5 kg)   10/08/22 198 lb 4 oz (89.9 kg)  08/18/22 208 lb (94.3 kg)    Physical Exam Constitutional:      General: He is not in acute distress.    Appearance: He is well-developed. He is not diaphoretic.  HENT:     Head: Normocephalic and atraumatic.     Right Ear: External ear normal.     Left Ear: External ear normal.     Mouth/Throat:     Pharynx: No oropharyngeal exudate.  Eyes:  Conjunctiva/sclera: Conjunctivae normal.     Pupils: Pupils are equal, round, and reactive to light.  Cardiovascular:     Rate and Rhythm: Normal rate and regular rhythm.     Heart sounds: Normal heart sounds.  Pulmonary:     Effort: Pulmonary effort is normal.     Breath sounds: Rhonchi (scattered bilaterally) present.  Abdominal:     General: Bowel sounds are normal.     Palpations: Abdomen is soft.  Musculoskeletal:        General: No tenderness.     Cervical back: Normal range of motion and neck supple.     Right lower leg: No edema.     Left lower leg: No edema.  Skin:    General: Skin is warm and dry.  Neurological:     Mental Status: He is alert and oriented to person, place, and time.    Labs reviewed: Basic Metabolic Panel: Recent Labs    02/02/22 0000 08/27/22 0000  NA 137 138  K 4.0 4.0  CL 107 104  CO2 24* 27*  BUN 19 20  CREATININE 0.9 0.8  CALCIUM 9.6 9.9   Liver Function Tests: Recent Labs    02/02/22 0000 08/27/22 0000  AST 18 20  ALT 18 20  ALKPHOS 60 65  ALBUMIN 4.3 4.5   No results for input(s): "LIPASE", "AMYLASE" in the last 8760 hours. No results for input(s): "AMMONIA" in the last 8760 hours. CBC: Recent Labs    02/02/22 0000 08/27/22 0000  WBC 5.6 5.7  NEUTROABS  --  2,690.00  HGB 15.1 16.2  HCT 42 46  PLT 119* 130*   Lipid Panel: Recent Labs    02/02/22 0000 08/27/22 0000  CHOL 143 189  HDL 38 37  LDLCALC 83 120  TRIG 127 199*   TSH: No results for input(s): "TSH" in the last 8760 hours. A1C: No results found for:  "HGBA1C"   Assessment/Plan 1. Subacute cough -continue mucinex DM by mouth twice daily with full glass of water - DG Chest 2 View; Future - due to wheezing start predniSONE (STERAPRED UNI-PAK 21 TAB) 10 MG (21) TBPK tablet; Use as directed  Dispense: 21 tablet; Refill: 0 - azithromycin (ZITHROMAX) 250 MG tablet; Take 2 tablets on day 1, then 1 tablet daily on days 2 through 5  Dispense: 6 tablet; Refill: 0  2. Grief Due to recent loss of wife. Has a good support system with family and friends.     Janene Harvey. Biagio Borg  [facility] [phone]

## 2022-11-12 ENCOUNTER — Ambulatory Visit: Payer: Medicare PPO | Admitting: Adult Health

## 2022-11-17 ENCOUNTER — Ambulatory Visit: Payer: Medicare PPO | Admitting: Nurse Practitioner

## 2022-11-17 ENCOUNTER — Encounter: Payer: Self-pay | Admitting: Nurse Practitioner

## 2022-11-17 DIAGNOSIS — R052 Subacute cough: Secondary | ICD-10-CM | POA: Diagnosis not present

## 2022-11-17 DIAGNOSIS — J302 Other seasonal allergic rhinitis: Secondary | ICD-10-CM

## 2022-11-17 DIAGNOSIS — J449 Chronic obstructive pulmonary disease, unspecified: Secondary | ICD-10-CM | POA: Diagnosis not present

## 2022-11-17 NOTE — Progress Notes (Signed)
Careteam: Patient Care Team: Sharon Seller, NP as PCP - General (Geriatric Medicine) Debbe Odea, MD as PCP - Cardiology (Cardiology)  Advanced Directive information Does Patient Have a Medical Advance Directive?: Yes, Type of Advance Directive: Out of facility DNR (pink MOST or yellow form), Does patient want to make changes to medical advance directive?: No - Patient declined  Allergies  Allergen Reactions   Bee Pollen Anaphylaxis    Bee Venom   Bee Venom     Throat/mouth swelling    Chief Complaint  Patient presents with   Acute Visit    Follow up Cough and sore on neck.      HPI: Patient is a 77 y.o. male seen in today at the [facility] for xray results.  He took prednisone and azithromycin and completed this.  Cough persist.  He has not been taking mucinex DM Medication helped but has not resolved.  No fevers, chills.  No shortness of breath.   Scratched himself in the middle of the night and has been putting antibiotic ointment on area.   Review of Systems:  Review of Systems  Constitutional:  Negative for chills, fever and weight loss.  HENT:  Negative for tinnitus.   Respiratory:  Positive for cough. Negative for sputum production and shortness of breath.   Cardiovascular:  Negative for chest pain, palpitations and leg swelling.  Gastrointestinal:  Negative for abdominal pain, constipation, diarrhea and heartburn.  Genitourinary:  Negative for dysuria, frequency and urgency.  Musculoskeletal:  Negative for back pain, falls, joint pain and myalgias.  Skin: Negative.   Neurological:  Negative for dizziness and headaches.  Psychiatric/Behavioral:  Negative for depression and memory loss. The patient does not have insomnia.     Past Medical History:  Diagnosis Date   Arthritis    Knees   COPD (chronic obstructive pulmonary disease) (HCC)    Does use hearing aid    Bilateral   Enlarged prostate    GERD (gastroesophageal reflux disease)     Hypercholesterolemia    Past Surgical History:  Procedure Laterality Date   CHEEK AUGMENTATION Left 1970   REstructure of cheek bone    COLONOSCOPY  2017   Dr. Alphonsa Overall   COLONOSCOPY WITH PROPOFOL N/A 05/05/2022   Procedure: COLONOSCOPY WITH PROPOFOL;  Surgeon: Midge Minium, MD;  Location: [facility] ENDOSCOPY;  Service: Endoscopy;  Laterality: N/A;   ESOPHAGOGASTRODUODENOSCOPY (EGD) WITH PROPOFOL N/A 12/05/2020   Procedure: ESOPHAGOGASTRODUODENOSCOPY (EGD) WITH PROPOFOL;  Surgeon: Midge Minium, MD;  Location: [facility] ENDOSCOPY;  Service: Endoscopy;  Laterality: N/A;   EYE SURGERY Bilateral 2005   NASAL SEPTOPLASTY W/ TURBINOPLASTY Bilateral 12/24/2020   Procedure: NASAL SEPTOPLASTY WITH INFERIOR TURBINATE REDUCTION;  Surgeon: Bud Face, MD;  Location: [facility] ORS;  Service: ENT;  Laterality: Bilateral;   UPPER GI ENDOSCOPY     Social History:   reports that he quit smoking about 39 years ago. His smoking use included cigarettes. He has a 35.00 pack-year smoking history. He has never used smokeless tobacco. He reports that he does not currently use alcohol. He reports that he does not use drugs.  Family History  Problem Relation Age of Onset   Alzheimer's disease Mother    Diabetes Mother    Heart failure Father    Heart attack Father    Diabetes Father    Dementia Father     Medications: Patient's Medications  New Prescriptions   No medications on file  Previous Medications   ACETAMINOPHEN (TYLENOL) 500  MG TABLET    Take 500 mg by mouth as needed.   ALBUTEROL (PROVENTIL) (2.5 MG/3ML) 0.083% NEBULIZER SOLUTION    Take 3 mLs (2.5 mg total) by nebulization every 6 (six) hours as needed for wheezing or shortness of breath.   APOAEQUORIN (PREVAGEN) 10 MG CAPS    Take 1 capsule by mouth daily.    ASCORBIC ACID (VITAMIN C) 1000 MG TABLET    Take 1,000 mg by mouth daily.   CHOLECALCIFEROL (VITAMIN D3) 125 MCG (5000 UT) CAPS    Take by mouth.   CLOTRIMAZOLE-BETAMETHASONE (LOTRISONE) CREAM     Apply 1 Application topically 2 (two) times daily as needed.   CYCLOBENZAPRINE (FLEXERIL) 10 MG TABLET    TAKE 1 TABLET BY MOUTH TWICE  DAILY AS NEEDED FOR MUSCLE  SPASM(S)   FEXOFENADINE (ALLEGRA) 180 MG TABLET    Take 180 mg by mouth daily as needed.   FINASTERIDE (PROSCAR) 5 MG TABLET    Take 1 tablet (5 mg total) by mouth daily.   FLUTICASONE (FLONASE) 50 MCG/ACT NASAL SPRAY    USE 1 SPRAY IN BOTH  NOSTRILS DAILY   MELATONIN 5 MG TABS    Take 5 mg by mouth at bedtime.    METHYLSULFONYLMETHANE (MSM) 1000 MG CAPS    Take 1 capsule by mouth 2 (two) times daily.   MULTIPLE VITAMINS-MINERALS (MULTIVITAMIN MEN 50+) TABS    Take 1 tablet by mouth daily.   NABUMETONE (RELAFEN) 750 MG TABLET    Take 1 tablet (750 mg total) by mouth 2 (two) times daily as needed.   PANTOPRAZOLE (PROTONIX) 40 MG TABLET    Take 1 tablet (40 mg total) by mouth daily.   PREDNISONE (STERAPRED UNI-PAK 21 TAB) 10 MG (21) TBPK TABLET    Use as directed   SELENIUM 200 MCG TABS TABLET    Take by mouth daily.   SIMVASTATIN (ZOCOR) 40 MG TABLET    TAKE 1 TABLET BY MOUTH  DAILY   TAMSULOSIN (FLOMAX) 0.4 MG CAPS CAPSULE    Take 1 capsule (0.4 mg total) by mouth daily.   TEMAZEPAM (RESTORIL) 15 MG CAPSULE    TAKE 1 CAPSULE BY MOUTH AT  BEDTIME AS NEEDED FOR SLEEP   TESTOSTERONE 2 MG/24HR PT24    Place 1 patch onto the skin as needed.   TIOTROPIUM BROMIDE MONOHYDRATE (SPIRIVA RESPIMAT) 2.5 MCG/ACT AERS    Inhale 2 puffs into the lungs daily.   TRIAMCINOLONE CREAM (KENALOG) 0.1 %    APPLY  CREAM EXTERNALLY TO AFFECTED AREA TWICE DAILY   VITAMIN B-12 (CYANOCOBALAMIN) 500 MCG TABLET    Take 500 mcg by mouth daily.  Modified Medications   No medications on file  Discontinued Medications   No medications on file    Physical Exam:  There were no vitals filed for this visit. There is no height or weight on file to calculate BMI. Wt Readings from Last 3 Encounters:  10/22/22 204 lb (92.5 kg)  10/08/22 198 lb 4 oz (89.9 kg)   08/18/22 208 lb (94.3 kg)    Physical Exam Constitutional:      General: He is not in acute distress.    Appearance: He is well-developed. He is not diaphoretic.  HENT:     Head: Normocephalic and atraumatic.     Right Ear: External ear normal.     Left Ear: External ear normal.     Mouth/Throat:     Pharynx: No oropharyngeal exudate.  Eyes:  Conjunctiva/sclera: Conjunctivae normal.     Pupils: Pupils are equal, round, and reactive to light.  Cardiovascular:     Rate and Rhythm: Normal rate and regular rhythm.     Heart sounds: Normal heart sounds.  Pulmonary:     Effort: Pulmonary effort is normal.     Breath sounds: Normal breath sounds.  Abdominal:     General: Bowel sounds are normal.     Palpations: Abdomen is soft.  Musculoskeletal:        General: No tenderness.     Cervical back: Normal range of motion and neck supple.     Right lower leg: No edema.     Left lower leg: No edema.  Skin:    General: Skin is warm and dry.  Neurological:     Mental Status: He is alert and oriented to person, place, and time.     Labs reviewed: Basic Metabolic Panel: Recent Labs    02/02/22 0000 08/27/22 0000  NA 137 138  K 4.0 4.0  CL 107 104  CO2 24* 27*  BUN 19 20  CREATININE 0.9 0.8  CALCIUM 9.6 9.9   Liver Function Tests: Recent Labs    02/02/22 0000 08/27/22 0000  AST 18 20  ALT 18 20  ALKPHOS 60 65  ALBUMIN 4.3 4.5   No results for input(s): "LIPASE", "AMYLASE" in the last 8760 hours. No results for input(s): "AMMONIA" in the last 8760 hours. CBC: Recent Labs    02/02/22 0000 08/27/22 0000  WBC 5.6 5.7  NEUTROABS  --  2,690.00  HGB 15.1 16.2  HCT 42 46  PLT 119* 130*   Lipid Panel: Recent Labs    02/02/22 0000 08/27/22 0000  CHOL 143 189  HDL 38 37  LDLCALC 83 120  TRIG 127 199*   TSH: No results for input(s): "TSH" in the last 8760 hours. A1C: No results found for: "HGBA1C" DG Chest 2 View  Result Date: 11/13/2022 CLINICAL DATA:   Ongoing cough EXAM: CHEST - 2 VIEW COMPARISON:  05/08/2022 FINDINGS: Normal heart size, mediastinal contours, and pulmonary vascularity. Atherosclerotic calcification aorta. Minimal LEFT basilar atelectasis. Lungs otherwise clear. No infiltrate, pleural effusion, or pneumothorax. Osseous structures unremarkable. IMPRESSION: Minimal LEFT basilar atelectasis. Aortic Atherosclerosis (ICD10-I70.0). Electronically Signed   By: Ulyses Southward M.D.   On: 11/13/2022 15:48     Assessment/Plan 1. Subacute cough Completed prednisone and azithromycin, Improved but ongoing, to continue mucinex DM BID with full glass of water -encouraged pulmonary follow up if symptoms do not resolve  2. Seasonal allergies likely contributing, to start zyrtec 10 mg daily  3. Chronic obstructive pulmonary disease, unspecified COPD type (HCC) Not taking Spiriva daily, stressed importance of medication compliance due to cough.   Janene Harvey. Biagio Borg  [facility] [phone]

## 2022-11-17 NOTE — Patient Instructions (Addendum)
Start zyrtec 10 mg by mouth daily okay to use generic  Continue mucinex DM by mouth twice daily with full glass of water

## 2022-12-01 ENCOUNTER — Encounter: Payer: Self-pay | Admitting: *Deleted

## 2022-12-01 ENCOUNTER — Telehealth: Payer: Self-pay | Admitting: *Deleted

## 2022-12-01 NOTE — Telephone Encounter (Signed)
ATC patient regarding SD card.  Left detailed message (DPR) requesting that he bring his SD card if he has one to his appointment.

## 2022-12-02 ENCOUNTER — Ambulatory Visit: Payer: Medicare PPO | Admitting: Adult Health

## 2022-12-02 ENCOUNTER — Encounter: Payer: Self-pay | Admitting: Adult Health

## 2022-12-02 VITALS — BP 144/78 | HR 72 | Temp 97.8°F | Ht 73.0 in | Wt 199.6 lb

## 2022-12-02 DIAGNOSIS — J441 Chronic obstructive pulmonary disease with (acute) exacerbation: Secondary | ICD-10-CM

## 2022-12-02 MED ORDER — PREDNISONE 20 MG PO TABS: 20.0000 mg | ORAL_TABLET | Freq: Every day | ORAL | 0 refills | Status: DC

## 2022-12-02 MED ORDER — BENZONATATE 200 MG PO CAPS: 200.0000 mg | ORAL_CAPSULE | Freq: Three times a day (TID) | ORAL | 1 refills | Status: DC | PRN

## 2022-12-02 MED ORDER — STIOLTO RESPIMAT 2.5-2.5 MCG/ACT IN AERS: 2.0000 | INHALATION_SPRAY | Freq: Every day | RESPIRATORY_TRACT | 0 refills | Status: DC

## 2022-12-02 NOTE — Assessment & Plan Note (Addendum)
Slowly resolving COPD exacerbation-we will give a short course of steroids.  Increase his maintenance inhaler  Continue with mucociliary clearance.  If not improving will need to consider repeating chest x-ray and or CT chest if indicated Control for cough triggers Rhinitis , add cough control rx.   Plan  Patient Instructions  Restart Allegra daily for next 1 week then As needed   Mucinex DM Twice daily  for next 1 week as needed.  Hold Spiriva, Begin Stiolto 2 puffs daily until sample is gone, then restart Spiriva daily  Prednisone 20mg  daily for 5 days  Tessalon Three times a day  As needed  cough.  Albuterol inhaler or neb As needed   Activity as tolerated.  Follow up with Dr. Craige Cotta  in 3-4 months  in [place] and As needed   Please contact office for sooner follow up if symptoms do not improve or worsen or seek emergency care   '

## 2022-12-02 NOTE — Patient Instructions (Addendum)
Restart Allegra daily for next 1 week then As needed   Mucinex DM Twice daily  for next 1 week as needed.  Hold Spiriva, Begin Stiolto 2 puffs daily until sample is gone, then restart Spiriva daily  Prednisone 20mg  daily for 5 days  Tessalon Three times a day  As needed  cough.  Albuterol inhaler or neb As needed   Activity as tolerated.  Follow up with Dr. Craige Cotta  in 3-4 months  in [place] and As needed   Please contact office for sooner follow up if symptoms do not improve or worsen or seek emergency care

## 2022-12-02 NOTE — Progress Notes (Signed)
@Patient  ID: Joshua Mercado, male    DOB: 03/05/1946, 76 y.o.   MRN: [id number]  Chief Complaint  Patient presents with   Follow-up    Referring provider: Sharon Seller, NP  HPI: 77 year old male former smoker followed for COPD and sleep apnea (CPAP intolerant)  TEST/EVENTS :  PFT 10/31/19 >> FEV1 2.84 (85%), FEV1% 69, TLC 7.45 (97%), DLCO 74%, +BD   Chest Imaging:  CT chest 01/10/14 >> apical scarring, old granulomas, trace BTX in Rt base   Sleep Tests:  HST 02/06/16 >> AHI 16.6, SpO2 low 84% PSG 11/18/17 >> AHI 11.1, SpO2 low 87%, Bipap 17/13 cm H2O   12/02/2022 Acute OV : COPD  Patient presents for an acute office visit.  Complains that around 6 weeks ago he developed a cough and congestion.  He was treated for possible viral illness.  Patient says he continues to have ongoing cough and congestion.  April 30 he was seen again by primary care.  Was given a Z-Pak and prednisone taper.  Chest x-ray showed COPD changes without acute process.  Patient says he is better after taking antibiotics and steroids still has some lingering cough but definitely feels some improvement.  He denies any shortness of breath.  No discolored mucus or fever.  Has been under a lot of stress over the last few weeks as his wife passed away unexpectedly.   He remains on Spiriva daily.  Does use his albuterol inhaler with some perceived benefit.  He denies any chest pain, orthopnea, edema.   Allergies  Allergen Reactions   Bee Pollen Anaphylaxis    Bee Venom   Bee Venom     Throat/mouth swelling    Immunization History  Administered Date(s) Administered   Covid-19, Mrna,Vaccine(Spikevax)43yrs and older 10/20/2022   Influenza, High Dose Seasonal PF 04/11/2015, 04/15/2016   Influenza-Unspecified 05/14/2019, 05/02/2020, 04/24/2021, 04/10/2022   Moderna Covid-19 Vaccine Bivalent Booster 56yrs & up 04/22/2021, 12/09/2021, 10/20/2022   Moderna Sars-Covid-2 Vaccination 07/25/2019, 08/16/2019, 09/13/2019,  05/28/2020, 11/28/2020   Pneumococcal Conjugate-13 04/13/2015, 05/13/2015   Pneumococcal Polysaccharide-23 11/24/2012   RSV,unspecified 06/11/2022   Tdap 12/11/2016   Unspecified SARS-COV-2 Vaccination 06/11/2022   Zoster Recombinat (Shingrix) 03/18/2022   Zoster, Live 06/13/2007    Past Medical History:  Diagnosis Date   Arthritis    Knees   COPD (chronic obstructive pulmonary disease) (HCC)    Does use hearing aid    Bilateral   Enlarged prostate    GERD (gastroesophageal reflux disease)    Hypercholesterolemia     Tobacco History: Social History   Tobacco Use  Smoking Status Former   Packs/day: 1.00   Years: 35.00   Additional pack years: 0.00   Total pack years: 35.00   Types: Cigarettes   Quit date: 1985   Years since quitting: 39.4  Smokeless Tobacco Never   Counseling given: Not Answered   Outpatient Medications Prior to Visit  Medication Sig Dispense Refill   acetaminophen (TYLENOL) 500 MG tablet Take 500 mg by mouth as needed.     albuterol (PROVENTIL) (2.5 MG/3ML) 0.083% nebulizer solution Take 3 mLs (2.5 mg total) by nebulization every 6 (six) hours as needed for wheezing or shortness of breath. 3 mL 0   Apoaequorin (PREVAGEN) 10 MG CAPS Take 1 capsule by mouth daily.      Ascorbic Acid (VITAMIN C) 1000 MG tablet Take 1,000 mg by mouth daily.     Cholecalciferol (VITAMIN D3) 125 MCG (5000 UT) CAPS Take by mouth.  clotrimazole-betamethasone (LOTRISONE) cream Apply 1 Application topically 2 (two) times daily as needed.     cyclobenzaprine (FLEXERIL) 10 MG tablet TAKE 1 TABLET BY MOUTH TWICE  DAILY AS NEEDED FOR MUSCLE  SPASM(S) 60 tablet 3   fexofenadine (ALLEGRA) 180 MG tablet Take 180 mg by mouth daily as needed.     finasteride (PROSCAR) 5 MG tablet Take 1 tablet (5 mg total) by mouth daily. 90 tablet 1   fluticasone (FLONASE) 50 MCG/ACT nasal spray USE 1 SPRAY IN BOTH  NOSTRILS DAILY 32 g 3   melatonin 5 MG TABS Take 5 mg by mouth at bedtime.       Methylsulfonylmethane (MSM) 1000 MG CAPS Take 1 capsule by mouth 2 (two) times daily.     Multiple Vitamins-Minerals (MULTIVITAMIN MEN 50+) TABS Take 1 tablet by mouth daily.     nabumetone (RELAFEN) 750 MG tablet Take 1 tablet (750 mg total) by mouth 2 (two) times daily as needed. 180 tablet 1   pantoprazole (PROTONIX) 40 MG tablet Take 1 tablet (40 mg total) by mouth daily. 90 tablet 1   selenium 200 MCG TABS tablet Take by mouth daily.     simvastatin (ZOCOR) 40 MG tablet TAKE 1 TABLET BY MOUTH  DAILY 90 tablet 3   tamsulosin (FLOMAX) 0.4 MG CAPS capsule Take 1 capsule (0.4 mg total) by mouth daily. 90 capsule 1   temazepam (RESTORIL) 15 MG capsule TAKE 1 CAPSULE BY MOUTH AT  BEDTIME AS NEEDED FOR SLEEP 90 capsule 1   Testosterone 2 MG/24HR PT24 Place 1 patch onto the skin as needed.     Tiotropium Bromide Monohydrate (SPIRIVA RESPIMAT) 2.5 MCG/ACT AERS Inhale 2 puffs into the lungs daily. 12 g 3   triamcinolone cream (KENALOG) 0.1 % APPLY  CREAM EXTERNALLY TO AFFECTED AREA TWICE DAILY 80 g 1   vitamin B-12 (CYANOCOBALAMIN) 500 MCG tablet Take 500 mcg by mouth daily.     predniSONE (STERAPRED UNI-PAK 21 TAB) 10 MG (21) TBPK tablet Use as directed (Patient not taking: Reported on 12/02/2022) 21 tablet 0   No facility-administered medications prior to visit.     Review of Systems:   Constitutional:   No  weight loss, night sweats,  Fevers, chills, fatigue, or  lassitude.  HEENT:   No headaches,  Difficulty swallowing,  Tooth/dental problems, or  Sore throat,                No sneezing, itching, ear ache,  +nasal congestion, post nasal drip,   CV:  No chest pain,  Orthopnea, PND, swelling in lower extremities, anasarca, dizziness, palpitations, syncope.   GI  No heartburn, indigestion, abdominal pain, nausea, vomiting, diarrhea, change in bowel habits, loss of appetite, bloody stools.   Resp:   No chest wall deformity  Skin: no rash or lesions.  GU: no dysuria, change in color of  urine, no urgency or frequency.  No flank pain, no hematuria   MS:  No joint pain or swelling.  No decreased range of motion.  No back pain.    Physical Exam  BP (!) 144/78 (BP Location: Left Arm, Patient Position: Sitting, Cuff Size: Normal)   Pulse 72   Temp 97.8 F (36.6 C) (Oral)   Ht 6\' 1"  (1.854 m)   Wt 199 lb 9.6 oz (90.5 kg)   SpO2 96%   BMI 26.33 kg/m   GEN: A/Ox3; pleasant , NAD, well nourished    HEENT:  /AT,  NOSE-clear, THROAT-clear, no lesions, no  postnasal drip or exudate noted.   NECK:  Supple w/ fair ROM; no JVD; normal carotid impulses w/o bruits; no thyromegaly or nodules palpated; no lymphadenopathy.    RESP few trace rhonchi.   no accessory muscle use, no dullness to percussion  CARD:  RRR, no m/r/g, no peripheral edema, pulses intact, no cyanosis or clubbing.  GI:   Soft & nt; nml bowel sounds; no organomegaly or masses detected.   Musco: Warm bil, no deformities or joint swelling noted.   Neuro: alert, no focal deficits noted.    Skin: Warm, no lesions or rashes    Lab Results:  CBC   BNP No results found for: "BNP"  ProBNP No results found for: "PROBNP"  Imaging: DG Chest 2 View  Result Date: 11/13/2022 CLINICAL DATA:  Ongoing cough EXAM: CHEST - 2 VIEW COMPARISON:  05/08/2022 FINDINGS: Normal heart size, mediastinal contours, and pulmonary vascularity. Atherosclerotic calcification aorta. Minimal LEFT basilar atelectasis. Lungs otherwise clear. No infiltrate, pleural effusion, or pneumothorax. Osseous structures unremarkable. IMPRESSION: Minimal LEFT basilar atelectasis. Aortic Atherosclerosis (ICD10-I70.0). Electronically Signed   By: Ulyses Southward M.D.   On: 11/13/2022 15:48          No data to display          No results found for: "NITRICOXIDE"      Assessment & Plan:   Chronic obstructive pulmonary disease (HCC) Slowly resolving COPD exacerbation-we will give a short course of steroids.  Increase his maintenance  inhaler  Continue with mucociliary clearance.  If not improving will need to consider repeating chest x-ray and or CT chest if indicated Control for cough triggers Rhinitis , add cough control rx.   Plan  Patient Instructions  Restart Allegra daily for next 1 week then As needed   Mucinex DM Twice daily  for next 1 week as needed.  Hold Spiriva, Begin Stiolto 2 puffs daily until sample is gone, then restart Spiriva daily  Prednisone 20mg  daily for 5 days  Tessalon Three times a day  As needed  cough.  Albuterol inhaler or neb As needed   Activity as tolerated.  Follow up with Dr. Craige Cotta  in 3-4 months  in [place] and As needed   Please contact office for sooner follow up if symptoms do not improve or worsen or seek emergency care   '     Jaiyanna Safran, NP 12/02/2022

## 2022-12-14 DIAGNOSIS — L814 Other melanin hyperpigmentation: Secondary | ICD-10-CM | POA: Diagnosis not present

## 2022-12-14 DIAGNOSIS — L603 Nail dystrophy: Secondary | ICD-10-CM | POA: Diagnosis not present

## 2022-12-14 DIAGNOSIS — D2372 Other benign neoplasm of skin of left lower limb, including hip: Secondary | ICD-10-CM | POA: Diagnosis not present

## 2022-12-14 DIAGNOSIS — L821 Other seborrheic keratosis: Secondary | ICD-10-CM | POA: Diagnosis not present

## 2022-12-15 ENCOUNTER — Ambulatory Visit (INDEPENDENT_AMBULATORY_CARE_PROVIDER_SITE_OTHER): Payer: Medicare PPO | Admitting: Nurse Practitioner

## 2022-12-15 ENCOUNTER — Encounter: Payer: Self-pay | Admitting: Nurse Practitioner

## 2022-12-15 VITALS — BP 136/84 | HR 61 | Temp 98.2°F | Ht 73.0 in | Wt 196.0 lb

## 2022-12-15 DIAGNOSIS — F4321 Adjustment disorder with depressed mood: Secondary | ICD-10-CM | POA: Diagnosis not present

## 2022-12-15 DIAGNOSIS — F419 Anxiety disorder, unspecified: Secondary | ICD-10-CM | POA: Diagnosis not present

## 2022-12-15 MED ORDER — ESCITALOPRAM OXALATE 5 MG PO TABS: 5.0000 mg | ORAL_TABLET | Freq: Every day | ORAL | 1 refills | Status: DC

## 2022-12-15 NOTE — Progress Notes (Unsigned)
Careteam: Patient Care Team: Sharon Seller, NP as PCP - General (Geriatric Medicine) Debbe Odea, MD as PCP - Cardiology (Cardiology)  PLACE OF SERVICE:  [facility]  Advanced Directive information Does Patient Have a Medical Advance Directive?: Yes, Type of Advance Directive: Out of facility DNR (pink MOST or yellow form), Does patient want to make changes to medical advance directive?: No - Patient declined  Allergies  Allergen Reactions   Bee Pollen Anaphylaxis    Bee Venom   Bee Venom     Throat/mouth swelling    Chief Complaint  Patient presents with   Acute Visit    Grieving wife that past away a month ago. Not sleeping well. Requesting Prozac to help relax.      HPI: Patient is a 77 y.o. male  he is having trouble sleeping and missing his wife. He needs something to mellow him out Reports he is on edge.  He states he is floating along. Was with his friend and wife and they came to [place] today.  He is taking melatonin and it helps but would wakes up around 3 am and can not go back to sleep. Has tired more and less but 5 mg works the best.   Review of Systems:  Review of Systems  Constitutional:  Negative for chills, fever, malaise/fatigue and weight loss.  Psychiatric/Behavioral:  The patient is nervous/anxious and has insomnia.    Past Medical History:  Diagnosis Date   Arthritis    Knees   COPD (chronic obstructive pulmonary disease) (HCC)    Does use hearing aid    Bilateral   Enlarged prostate    GERD (gastroesophageal reflux disease)    Hypercholesterolemia    Past Surgical History:  Procedure Laterality Date   CHEEK AUGMENTATION Left 1970   REstructure of cheek bone    COLONOSCOPY  2017   Dr. Alphonsa Overall   COLONOSCOPY WITH PROPOFOL N/A 05/05/2022   Procedure: COLONOSCOPY WITH PROPOFOL;  Surgeon: Midge Minium, MD;  Location: [facility] ENDOSCOPY;  Service: Endoscopy;  Laterality: N/A;   ESOPHAGOGASTRODUODENOSCOPY (EGD) WITH PROPOFOL N/A  12/05/2020   Procedure: ESOPHAGOGASTRODUODENOSCOPY (EGD) WITH PROPOFOL;  Surgeon: Midge Minium, MD;  Location: [facility] ENDOSCOPY;  Service: Endoscopy;  Laterality: N/A;   EYE SURGERY Bilateral 2005   NASAL SEPTOPLASTY W/ TURBINOPLASTY Bilateral 12/24/2020   Procedure: NASAL SEPTOPLASTY WITH INFERIOR TURBINATE REDUCTION;  Surgeon: Bud Face, MD;  Location: [facility] ORS;  Service: ENT;  Laterality: Bilateral;   UPPER GI ENDOSCOPY     Social History:   reports that he quit smoking about 39 years ago. His smoking use included cigarettes. He has a 35.00 pack-year smoking history. He has never used smokeless tobacco. He reports that he does not currently use alcohol. He reports that he does not use drugs.  Family History  Problem Relation Age of Onset   Alzheimer's disease Mother    Diabetes Mother    Heart failure Father    Heart attack Father    Diabetes Father    Dementia Father     Medications: Patient's Medications  New Prescriptions   No medications on file  Previous Medications   ACETAMINOPHEN (TYLENOL) 500 MG TABLET    Take 500 mg by mouth as needed.   ALBUTEROL (PROVENTIL) (2.5 MG/3ML) 0.083% NEBULIZER SOLUTION    Take 3 mLs (2.5 mg total) by nebulization every 6 (six) hours as needed for wheezing or shortness of breath.   APOAEQUORIN (PREVAGEN) 10 MG CAPS    Take  1 capsule by mouth daily.    ASCORBIC ACID (VITAMIN C) 1000 MG TABLET    Take 1,000 mg by mouth daily.   BENZONATATE (TESSALON) 200 MG CAPSULE    Take 1 capsule (200 mg total) by mouth 3 (three) times daily as needed.   CHOLECALCIFEROL (VITAMIN D3) 125 MCG (5000 UT) CAPS    Take by mouth.   CLOTRIMAZOLE-BETAMETHASONE (LOTRISONE) CREAM    Apply 1 Application topically 2 (two) times daily as needed.   CYCLOBENZAPRINE (FLEXERIL) 10 MG TABLET    TAKE 1 TABLET BY MOUTH TWICE  DAILY AS NEEDED FOR MUSCLE  SPASM(S)   FEXOFENADINE (ALLEGRA) 180 MG TABLET    Take 180 mg by mouth daily as needed.   FINASTERIDE (PROSCAR) 5 MG TABLET     Take 1 tablet (5 mg total) by mouth daily.   FLUTICASONE (FLONASE) 50 MCG/ACT NASAL SPRAY    USE 1 SPRAY IN BOTH  NOSTRILS DAILY   MELATONIN 5 MG TABS    Take 5 mg by mouth at bedtime.    METHYLSULFONYLMETHANE (MSM) 1000 MG CAPS    Take 1 capsule by mouth 2 (two) times daily.   MULTIPLE VITAMINS-MINERALS (MULTIVITAMIN MEN 50+) TABS    Take 1 tablet by mouth daily.   NABUMETONE (RELAFEN) 750 MG TABLET    Take 1 tablet (750 mg total) by mouth 2 (two) times daily as needed.   PANTOPRAZOLE (PROTONIX) 40 MG TABLET    Take 1 tablet (40 mg total) by mouth daily.   PREDNISONE (DELTASONE) 20 MG TABLET    Take 1 tablet (20 mg total) by mouth daily with breakfast.   SELENIUM 200 MCG TABS TABLET    Take by mouth daily.   SIMVASTATIN (ZOCOR) 40 MG TABLET    TAKE 1 TABLET BY MOUTH  DAILY   TAMSULOSIN (FLOMAX) 0.4 MG CAPS CAPSULE    Take 1 capsule (0.4 mg total) by mouth daily.   TEMAZEPAM (RESTORIL) 15 MG CAPSULE    TAKE 1 CAPSULE BY MOUTH AT  BEDTIME AS NEEDED FOR SLEEP   TESTOSTERONE 2 MG/24HR PT24    Place 1 patch onto the skin as needed.   TIOTROPIUM BROMIDE MONOHYDRATE (SPIRIVA RESPIMAT) 2.5 MCG/ACT AERS    Inhale 2 puffs into the lungs daily.   TIOTROPIUM BROMIDE-OLODATEROL (STIOLTO RESPIMAT) 2.5-2.5 MCG/ACT AERS    Inhale 2 puffs into the lungs daily.   TRIAMCINOLONE CREAM (KENALOG) 0.1 %    APPLY  CREAM EXTERNALLY TO AFFECTED AREA TWICE DAILY   VITAMIN B-12 (CYANOCOBALAMIN) 500 MCG TABLET    Take 500 mcg by mouth daily.  Modified Medications   No medications on file  Discontinued Medications   No medications on file    Physical Exam:  Vitals:   12/15/22 1014  BP: 136/84  Pulse: 61  Temp: 98.2 F (36.8 C)  SpO2: 97%  Weight: 196 lb (88.9 kg)  Height: 6\' 1"  (1.854 m)   Body mass index is 25.86 kg/m. Wt Readings from Last 3 Encounters:  12/15/22 196 lb (88.9 kg)  12/02/22 199 lb 9.6 oz (90.5 kg)  10/22/22 204 lb (92.5 kg)    Physical Exam Constitutional:      Appearance: Normal  appearance.  Neurological:     Mental Status: He is alert. Mental status is at baseline.  Psychiatric:        Mood and Affect: Mood normal.   ***  Labs reviewed: Basic Metabolic Panel: Recent Labs    02/02/22 0000 08/27/22 0000  NA  137 138  K 4.0 4.0  CL 107 104  CO2 24* 27*  BUN 19 20  CREATININE 0.9 0.8  CALCIUM 9.6 9.9   Liver Function Tests: Recent Labs    02/02/22 0000 08/27/22 0000  AST 18 20  ALT 18 20  ALKPHOS 60 65  ALBUMIN 4.3 4.5   No results for input(s): "LIPASE", "AMYLASE" in the last 8760 hours. No results for input(s): "AMMONIA" in the last 8760 hours. CBC: Recent Labs    02/02/22 0000 08/27/22 0000  WBC 5.6 5.7  NEUTROABS  --  2,690.00  HGB 15.1 16.2  HCT 42 46  PLT 119* 130*   Lipid Panel: Recent Labs    02/02/22 0000 08/27/22 0000  CHOL 143 189  HDL 38 37  LDLCALC 83 120  TRIG 127 199*   TSH: No results for input(s): "TSH" in the last 8760 hours. A1C: No results found for: "HGBA1C"   Assessment/Plan There are no diagnoses linked to this encounter.  No follow-ups on file.: ***  Yeilin Zweber K. Biagio Borg [facility] [phone]

## 2022-12-17 ENCOUNTER — Telehealth: Payer: Self-pay

## 2022-12-17 MED ORDER — SERTRALINE HCL 25 MG PO TABS: 25.0000 mg | ORAL_TABLET | Freq: Every day | ORAL | 0 refills | Status: DC

## 2022-12-17 NOTE — Telephone Encounter (Signed)
Patient called and stated that he does take the medication with food in the morning.   Stated that his stomach started hurting yesterday Evening up into the night. Took Tums with no relief.   Not currently hurting now but have not taken the depression medication today either.   Please Advise.

## 2022-12-17 NOTE — Telephone Encounter (Signed)
Would make sure he takes medication with food

## 2022-12-17 NOTE — Telephone Encounter (Signed)
Patient called stating that the antidepressant medication is causing stomach pains, is there something else he can take?  Message sent to Abbey Chatters, NP

## 2022-12-17 NOTE — Telephone Encounter (Signed)
Patient informed to stop lexapro and start zoloft.

## 2022-12-17 NOTE — Telephone Encounter (Signed)
To stop lexapro and start zoloft, he can start with a half tablet for 1 week then increase to whole to avoid side effects

## 2022-12-22 ENCOUNTER — Encounter: Payer: Self-pay | Admitting: Nurse Practitioner

## 2022-12-22 ENCOUNTER — Ambulatory Visit: Payer: Medicare PPO | Admitting: Nurse Practitioner

## 2022-12-22 VITALS — BP 138/82 | HR 77 | Temp 97.8°F | Ht 73.0 in | Wt 194.0 lb

## 2022-12-22 DIAGNOSIS — K219 Gastro-esophageal reflux disease without esophagitis: Secondary | ICD-10-CM | POA: Diagnosis not present

## 2022-12-22 DIAGNOSIS — F419 Anxiety disorder, unspecified: Secondary | ICD-10-CM | POA: Diagnosis not present

## 2022-12-22 NOTE — Progress Notes (Signed)
Careteam: Patient Care Team: Sharon Seller, NP as PCP - General (Geriatric Medicine) Debbe Odea, MD as PCP - Cardiology (Cardiology)  PLACE OF SERVICE:  [facility]  Advanced Directive information Does Patient Have a Medical Advance Directive?: Yes, Type of Advance Directive: Out of facility DNR (pink MOST or yellow form), Does patient want to make changes to medical advance directive?: No - Patient declined  Allergies  Allergen Reactions   Bee Pollen Anaphylaxis    Bee Venom   Bee Venom     Throat/mouth swelling    Chief Complaint  Patient presents with   Acute Visit    Upper chest pains after eating onions. Thought it was medications reacting.    Quality Metric Gaps    To discuss need for Covid and Zoster Vaccine     HPI: Patient is a 77 y.o. male for follow up medication.  He thought he was having chest pain/indigestion after he took medication then changed medication and had the same symptoms.  He had upper GI endoscopy due to symptoms in the past.  Then he realized he was eating more onions and now he stopped onions and now it is better.  Also thought maybe stress related.  No pain when he exercises  Has COPD but no changes in shortness of breath or shortness of breath with upper chest/GERD symptoms. He took tums but that did not help at that time. He took some omeprazole did help.  He is now taking lexapro- which is helping depression and anxiety Review of Systems:  Review of Systems  Constitutional:  Negative for chills, fever and weight loss.  HENT:  Negative for tinnitus.   Respiratory:  Negative for cough, sputum production and shortness of breath.   Cardiovascular:  Negative for chest pain, palpitations and leg swelling.  Gastrointestinal:  Negative for abdominal pain, constipation, diarrhea and heartburn.  Genitourinary:  Negative for dysuria, frequency and urgency.  Musculoskeletal:  Negative for back pain, falls, joint pain and myalgias.   Skin: Negative.   Neurological:  Negative for dizziness and headaches.  Psychiatric/Behavioral:  Negative for depression and memory loss. The patient does not have insomnia.     Past Medical History:  Diagnosis Date   Arthritis    Knees   COPD (chronic obstructive pulmonary disease) (HCC)    Does use hearing aid    Bilateral   Enlarged prostate    GERD (gastroesophageal reflux disease)    Hypercholesterolemia    Past Surgical History:  Procedure Laterality Date   CHEEK AUGMENTATION Left 1970   REstructure of cheek bone    COLONOSCOPY  2017   Dr. Alphonsa Overall   COLONOSCOPY WITH PROPOFOL N/A 05/05/2022   Procedure: COLONOSCOPY WITH PROPOFOL;  Surgeon: Midge Minium, MD;  Location: [facility] ENDOSCOPY;  Service: Endoscopy;  Laterality: N/A;   ESOPHAGOGASTRODUODENOSCOPY (EGD) WITH PROPOFOL N/A 12/05/2020   Procedure: ESOPHAGOGASTRODUODENOSCOPY (EGD) WITH PROPOFOL;  Surgeon: Midge Minium, MD;  Location: [facility] ENDOSCOPY;  Service: Endoscopy;  Laterality: N/A;   EYE SURGERY Bilateral 2005   NASAL SEPTOPLASTY W/ TURBINOPLASTY Bilateral 12/24/2020   Procedure: NASAL SEPTOPLASTY WITH INFERIOR TURBINATE REDUCTION;  Surgeon: Bud Face, MD;  Location: [facility] ORS;  Service: ENT;  Laterality: Bilateral;   UPPER GI ENDOSCOPY     Social History:   reports that he quit smoking about 39 years ago. His smoking use included cigarettes. He has a 35.00 pack-year smoking history. He has never used smokeless tobacco. He reports that he does not currently use alcohol.  He reports that he does not use drugs.  Family History  Problem Relation Age of Onset   Alzheimer's disease Mother    Diabetes Mother    Heart failure Father    Heart attack Father    Diabetes Father    Dementia Father     Medications: Patient's Medications  New Prescriptions   No medications on file  Previous Medications   ACETAMINOPHEN (TYLENOL) 500 MG TABLET    Take 500 mg by mouth as needed.   ALBUTEROL (PROVENTIL) (2.5 MG/3ML)  0.083% NEBULIZER SOLUTION    Take 3 mLs (2.5 mg total) by nebulization every 6 (six) hours as needed for wheezing or shortness of breath.   APOAEQUORIN (PREVAGEN) 10 MG CAPS    Take 1 capsule by mouth daily.    ASCORBIC ACID (VITAMIN C) 1000 MG TABLET    Take 1,000 mg by mouth daily.   BENZONATATE (TESSALON) 200 MG CAPSULE    Take 1 capsule (200 mg total) by mouth 3 (three) times daily as needed.   CHOLECALCIFEROL (VITAMIN D3) 125 MCG (5000 UT) CAPS    Take by mouth.   CLOTRIMAZOLE-BETAMETHASONE (LOTRISONE) CREAM    Apply 1 Application topically 2 (two) times daily as needed.   CYCLOBENZAPRINE (FLEXERIL) 10 MG TABLET    TAKE 1 TABLET BY MOUTH TWICE  DAILY AS NEEDED FOR MUSCLE  SPASM(S)   ESCITALOPRAM (LEXAPRO) 5 MG TABLET    Take 5 mg by mouth daily.   FEXOFENADINE (ALLEGRA) 180 MG TABLET    Take 180 mg by mouth daily as needed.   FINASTERIDE (PROSCAR) 5 MG TABLET    Take 1 tablet (5 mg total) by mouth daily.   FLUTICASONE (FLONASE) 50 MCG/ACT NASAL SPRAY    USE 1 SPRAY IN BOTH  NOSTRILS DAILY   MELATONIN 5 MG TABS    Take 5 mg by mouth at bedtime.    METHYLSULFONYLMETHANE (MSM) 1000 MG CAPS    Take 1 capsule by mouth 2 (two) times daily.   MULTIPLE VITAMINS-MINERALS (MULTIVITAMIN MEN 50+) TABS    Take 1 tablet by mouth daily.   NABUMETONE (RELAFEN) 750 MG TABLET    Take 1 tablet (750 mg total) by mouth 2 (two) times daily as needed.   PANTOPRAZOLE (PROTONIX) 40 MG TABLET    Take 1 tablet (40 mg total) by mouth daily.   PREDNISONE (DELTASONE) 20 MG TABLET    Take 1 tablet (20 mg total) by mouth daily with breakfast.   SELENIUM 200 MCG TABS TABLET    Take by mouth daily.   SERTRALINE (ZOLOFT) 25 MG TABLET    Take 1 tablet (25 mg total) by mouth daily.   SIMVASTATIN (ZOCOR) 40 MG TABLET    TAKE 1 TABLET BY MOUTH  DAILY   TAMSULOSIN (FLOMAX) 0.4 MG CAPS CAPSULE    Take 1 capsule (0.4 mg total) by mouth daily.   TEMAZEPAM (RESTORIL) 15 MG CAPSULE    TAKE 1 CAPSULE BY MOUTH AT  BEDTIME AS NEEDED FOR  SLEEP   TESTOSTERONE 2 MG/24HR PT24    Place 1 patch onto the skin as needed.   TIOTROPIUM BROMIDE MONOHYDRATE (SPIRIVA RESPIMAT) 2.5 MCG/ACT AERS    Inhale 2 puffs into the lungs daily.   TIOTROPIUM BROMIDE-OLODATEROL (STIOLTO RESPIMAT) 2.5-2.5 MCG/ACT AERS    Inhale 2 puffs into the lungs daily.   TRIAMCINOLONE CREAM (KENALOG) 0.1 %    APPLY  CREAM EXTERNALLY TO AFFECTED AREA TWICE DAILY   VITAMIN B-12 (CYANOCOBALAMIN) 500 MCG TABLET  Take 500 mcg by mouth daily.  Modified Medications   No medications on file  Discontinued Medications   No medications on file    Physical Exam:  Vitals:   12/22/22 1018 12/22/22 1026  BP: (!) 144/82 138/82  Pulse: 77   Temp: 97.8 F (36.6 C)   SpO2: 97%   Weight: 194 lb (88 kg)   Height: 6\' 1"  (1.854 m)    Body mass index is 25.6 kg/m. Wt Readings from Last 3 Encounters:  12/22/22 194 lb (88 kg)  12/15/22 196 lb (88.9 kg)  12/02/22 199 lb 9.6 oz (90.5 kg)    Physical Exam Constitutional:      Appearance: Normal appearance.  Neurological:     Mental Status: He is alert. Mental status is at baseline.  Psychiatric:        Mood and Affect: Mood normal.     Labs reviewed: Basic Metabolic Panel: Recent Labs    02/02/22 0000 08/27/22 0000  NA 137 138  K 4.0 4.0  CL 107 104  CO2 24* 27*  BUN 19 20  CREATININE 0.9 0.8  CALCIUM 9.6 9.9   Liver Function Tests: Recent Labs    02/02/22 0000 08/27/22 0000  AST 18 20  ALT 18 20  ALKPHOS 60 65  ALBUMIN 4.3 4.5   No results for input(s): "LIPASE", "AMYLASE" in the last 8760 hours. No results for input(s): "AMMONIA" in the last 8760 hours. CBC: Recent Labs    02/02/22 0000 08/27/22 0000  WBC 5.6 5.7  NEUTROABS  --  2,690.00  HGB 15.1 16.2  HCT 42 46  PLT 119* 130*   Lipid Panel: Recent Labs    02/02/22 0000 08/27/22 0000  CHOL 143 189  HDL 38 37  LDLCALC 83 120  TRIG 127 199*   TSH: No results for input(s): "TSH" in the last 8760 hours. A1C: No results found  for: "HGBA1C"   Assessment/Plan 1. Anxiety Has seen improvement with lexapro- will continue lexapro 5 mg by mouth daily at this time.   2. Gastroesophageal reflux disease without esophagitis Increase in symptoms after dietary changes. Continues on protonix 40 mg daily Encouraged lifestyle modifications to reduce GERD symptoms. To notify if these persist.   To keep follow up as scheduled.  Janene Harvey. Biagio Borg [facility] [phone]

## 2022-12-23 ENCOUNTER — Other Ambulatory Visit: Payer: Self-pay | Admitting: Nurse Practitioner

## 2022-12-23 DIAGNOSIS — N401 Enlarged prostate with lower urinary tract symptoms: Secondary | ICD-10-CM

## 2022-12-23 DIAGNOSIS — M170 Bilateral primary osteoarthritis of knee: Secondary | ICD-10-CM

## 2022-12-23 NOTE — Telephone Encounter (Signed)
High risk or very high risk warning populated when attempting to refill Nabumetone. RX request sent to PCP for review and approval if warranted.

## 2022-12-29 ENCOUNTER — Encounter: Payer: Self-pay | Admitting: Nurse Practitioner

## 2022-12-29 ENCOUNTER — Ambulatory Visit: Payer: Medicare PPO | Admitting: Nurse Practitioner

## 2022-12-29 VITALS — BP 138/86 | HR 62 | Temp 97.9°F | Ht 73.0 in | Wt 192.0 lb

## 2022-12-29 DIAGNOSIS — R630 Anorexia: Secondary | ICD-10-CM

## 2022-12-29 DIAGNOSIS — F322 Major depressive disorder, single episode, severe without psychotic features: Secondary | ICD-10-CM

## 2022-12-29 NOTE — Progress Notes (Signed)
Careteam: Patient Care Team: Sharon Seller, NP as PCP - General (Geriatric Medicine) Debbe Odea, MD as PCP - Cardiology (Cardiology)  Advanced Directive information Does Patient Have a Medical Advance Directive?: Yes, Type of Advance Directive: Out of facility DNR (pink MOST or yellow form), Does patient want to make changes to medical advance directive?: No - Patient declined  Allergies  Allergen Reactions   Bee Pollen Anaphylaxis    Bee Venom   Bee Venom     Throat/mouth swelling    Chief Complaint  Patient presents with   Acute Visit    Loss of appetite with new Depression Medications and Mood Swings.      HPI: Patient is a 77 y.o. male seen in today at the [facility] for decrease in appetite.  Reports he is not hungry on new medication for depression. Has to force himself to eat.  No nausea or vomiting.  Able to get to sleep and then will wake up and has a hard time getting back to sleep.  Has noticed mood swings recently. He is having to develop a new routine and new way of life.  Now by himself which is upsetting. Doing grief counseling. Has appt with the pastor at [facility].  More depression, no thoughts of hurting himself. Reports at times this is severe.   Review of Systems:  Review of Systems  Constitutional:  Positive for weight loss. Negative for chills and fever.  HENT:  Negative for tinnitus.   Respiratory:  Negative for cough, sputum production and shortness of breath.   Cardiovascular:  Negative for chest pain, palpitations and leg swelling.  Gastrointestinal:  Negative for abdominal pain, constipation, diarrhea and heartburn.  Genitourinary:  Negative for dysuria, frequency and urgency.  Musculoskeletal:  Negative for back pain, falls, joint pain and myalgias.  Skin: Negative.   Neurological:  Negative for dizziness and headaches.  Psychiatric/Behavioral:  Positive for depression. Negative for memory loss. The patient is  nervous/anxious and has insomnia.     Past Medical History:  Diagnosis Date   Arthritis    Knees   COPD (chronic obstructive pulmonary disease) (HCC)    Does use hearing aid    Bilateral   Enlarged prostate    GERD (gastroesophageal reflux disease)    Hypercholesterolemia    Past Surgical History:  Procedure Laterality Date   CHEEK AUGMENTATION Left 1970   REstructure of cheek bone    COLONOSCOPY  2017   Dr. Alphonsa Overall   COLONOSCOPY WITH PROPOFOL N/A 05/05/2022   Procedure: COLONOSCOPY WITH PROPOFOL;  Surgeon: Midge Minium, MD;  Location: [facility] ENDOSCOPY;  Service: Endoscopy;  Laterality: N/A;   ESOPHAGOGASTRODUODENOSCOPY (EGD) WITH PROPOFOL N/A 12/05/2020   Procedure: ESOPHAGOGASTRODUODENOSCOPY (EGD) WITH PROPOFOL;  Surgeon: Midge Minium, MD;  Location: [facility] ENDOSCOPY;  Service: Endoscopy;  Laterality: N/A;   EYE SURGERY Bilateral 2005   NASAL SEPTOPLASTY W/ TURBINOPLASTY Bilateral 12/24/2020   Procedure: NASAL SEPTOPLASTY WITH INFERIOR TURBINATE REDUCTION;  Surgeon: Bud Face, MD;  Location: [facility] ORS;  Service: ENT;  Laterality: Bilateral;   UPPER GI ENDOSCOPY     Social History:   reports that he quit smoking about 39 years ago. His smoking use included cigarettes. He has a 35.00 pack-year smoking history. He has never used smokeless tobacco. He reports that he does not currently use alcohol. He reports that he does not use drugs.  Family History  Problem Relation Age of Onset   Alzheimer's disease Mother    Diabetes Mother  Heart failure Father    Heart attack Father    Diabetes Father    Dementia Father     Medications: Patient's Medications  New Prescriptions   No medications on file  Previous Medications   ACETAMINOPHEN (TYLENOL) 500 MG TABLET    Take 500 mg by mouth as needed.   ALBUTEROL (PROVENTIL) (2.5 MG/3ML) 0.083% NEBULIZER SOLUTION    Take 3 mLs (2.5 mg total) by nebulization every 6 (six) hours as needed for wheezing or shortness of breath.    APOAEQUORIN (PREVAGEN) 10 MG CAPS    Take 1 capsule by mouth daily.    ASCORBIC ACID (VITAMIN C) 1000 MG TABLET    Take 1,000 mg by mouth daily.   BENZONATATE (TESSALON) 200 MG CAPSULE    Take 1 capsule (200 mg total) by mouth 3 (three) times daily as needed.   CHOLECALCIFEROL (VITAMIN D3) 125 MCG (5000 UT) CAPS    Take by mouth.   CLOTRIMAZOLE-BETAMETHASONE (LOTRISONE) CREAM    Apply 1 Application topically 2 (two) times daily as needed.   CYCLOBENZAPRINE (FLEXERIL) 10 MG TABLET    TAKE 1 TABLET BY MOUTH TWICE  DAILY AS NEEDED FOR MUSCLE  SPASM(S)   ESCITALOPRAM (LEXAPRO) 5 MG TABLET    Take 5 mg by mouth daily.   FEXOFENADINE (ALLEGRA) 180 MG TABLET    Take 180 mg by mouth daily as needed.   FINASTERIDE (PROSCAR) 5 MG TABLET    TAKE 1 TABLET EVERY DAY   FLUTICASONE (FLONASE) 50 MCG/ACT NASAL SPRAY    USE 1 SPRAY IN BOTH  NOSTRILS DAILY   MELATONIN 5 MG TABS    Take 5 mg by mouth at bedtime.    METHYLSULFONYLMETHANE (MSM) 1000 MG CAPS    Take 1 capsule by mouth 2 (two) times daily.   MULTIPLE VITAMINS-MINERALS (MULTIVITAMIN MEN 50+) TABS    Take 1 tablet by mouth daily.   NABUMETONE (RELAFEN) 750 MG TABLET    TAKE 1 TABLET TWICE DAILY AS NEEDED   PANTOPRAZOLE (PROTONIX) 40 MG TABLET    Take 1 tablet (40 mg total) by mouth daily.   PREDNISONE (DELTASONE) 20 MG TABLET    Take 1 tablet (20 mg total) by mouth daily with breakfast.   SELENIUM 200 MCG TABS TABLET    Take by mouth daily.   SIMVASTATIN (ZOCOR) 40 MG TABLET    TAKE 1 TABLET BY MOUTH  DAILY   TAMSULOSIN (FLOMAX) 0.4 MG CAPS CAPSULE    TAKE 1 CAPSULE EVERY DAY   TEMAZEPAM (RESTORIL) 15 MG CAPSULE    TAKE 1 CAPSULE BY MOUTH AT  BEDTIME AS NEEDED FOR SLEEP   TESTOSTERONE 2 MG/24HR PT24    Place 1 patch onto the skin as needed.   TIOTROPIUM BROMIDE MONOHYDRATE (SPIRIVA RESPIMAT) 2.5 MCG/ACT AERS    Inhale 2 puffs into the lungs daily.   TIOTROPIUM BROMIDE-OLODATEROL (STIOLTO RESPIMAT) 2.5-2.5 MCG/ACT AERS    Inhale 2 puffs into the lungs  daily.   TRIAMCINOLONE CREAM (KENALOG) 0.1 %    APPLY  CREAM EXTERNALLY TO AFFECTED AREA TWICE DAILY   VITAMIN B-12 (CYANOCOBALAMIN) 500 MCG TABLET    Take 500 mcg by mouth daily.  Modified Medications   No medications on file  Discontinued Medications   No medications on file    Physical Exam:  Vitals:   12/29/22 0834  BP: 138/86  Pulse: 62  Temp: 97.9 F (36.6 C)  SpO2: 96%  Weight: 192 lb (87.1 kg)  Height: 6\' 1"  (1.854  m)   Body mass index is 25.33 kg/m. Wt Readings from Last 3 Encounters:  12/29/22 192 lb (87.1 kg)  12/22/22 194 lb (88 kg)  12/15/22 196 lb (88.9 kg)    Physical Exam Constitutional:      Appearance: Normal appearance.  Neurological:     Mental Status: He is alert. Mental status is at baseline.  Psychiatric:        Mood and Affect: Mood normal.     Labs reviewed: Basic Metabolic Panel: Recent Labs    02/02/22 0000 08/27/22 0000  NA 137 138  K 4.0 4.0  CL 107 104  CO2 24* 27*  BUN 19 20  CREATININE 0.9 0.8  CALCIUM 9.6 9.9   Liver Function Tests: Recent Labs    02/02/22 0000 08/27/22 0000  AST 18 20  ALT 18 20  ALKPHOS 60 65  ALBUMIN 4.3 4.5   No results for input(s): "LIPASE", "AMYLASE" in the last 8760 hours. No results for input(s): "AMMONIA" in the last 8760 hours. CBC: Recent Labs    02/02/22 0000 08/27/22 0000  WBC 5.6 5.7  NEUTROABS  --  2,690.00  HGB 15.1 16.2  HCT 42 46  PLT 119* 130*   Lipid Panel: Recent Labs    02/02/22 0000 08/27/22 0000  CHOL 143 189  HDL 38 37  LDLCALC 83 120  TRIG 127 199*   TSH: No results for input(s): "TSH" in the last 8760 hours. A1C: No results found for: "HGBA1C"   Assessment/Plan 1. Current severe episode of major depressive disorder without psychotic features without prior episode (HCC) -occasionally depression severe. Trying to work through it after the loss of his wife. Has support group, SW and meeting with pastor.  To continue on lexapro.   2. Loss of  appetite Likely due to medication and will be transient. However if persist with ongoing weight loss to notify.     Janene Harvey. Biagio Borg  [facility] [phone]

## 2023-01-08 ENCOUNTER — Other Ambulatory Visit: Payer: Self-pay | Admitting: Nurse Practitioner

## 2023-01-12 ENCOUNTER — Ambulatory Visit: Payer: Medicare PPO | Admitting: Nurse Practitioner

## 2023-01-12 ENCOUNTER — Encounter: Payer: Self-pay | Admitting: Student

## 2023-01-13 ENCOUNTER — Encounter: Payer: Self-pay | Admitting: Student

## 2023-01-13 ENCOUNTER — Ambulatory Visit: Payer: Medicare PPO | Admitting: Student

## 2023-01-13 VITALS — BP 138/86 | HR 67 | Temp 98.2°F | Ht 73.0 in | Wt 191.0 lb

## 2023-01-13 DIAGNOSIS — F4321 Adjustment disorder with depressed mood: Secondary | ICD-10-CM | POA: Diagnosis not present

## 2023-01-13 DIAGNOSIS — F419 Anxiety disorder, unspecified: Secondary | ICD-10-CM

## 2023-01-13 MED ORDER — ESCITALOPRAM OXALATE 10 MG PO TABS
10.0000 mg | ORAL_TABLET | Freq: Every day | ORAL | 0 refills | Status: DC
Start: 2023-01-13 — End: 2023-02-16

## 2023-01-13 NOTE — Progress Notes (Signed)
Location[facility]  [facility]  Provider: Sydnee Cabal  Code Status: Courtnie Brenes Goals of Care:     01/13/2023   10:30 AM  Advanced Directives  Does Patient Have a Medical Advance Directive? Yes  Type of Advance Directive Out of facility DNR (pink MOST or yellow form)  Does patient want to make changes to medical advance directive? No - Patient declined  Pre-existing out of facility DNR order (yellow form or pink MOST form) Pink MOST form placed in chart (order not valid for inpatient use)     Chief Complaint  Patient presents with   Acute Visit    Discuss lexapro 5 mg     HPI: Patient is a 77 y.o. male seen today for medical management of chronic diseases.    He has continued to have depression and is interested in a dose adjustment of his lexapro. Denis symptoms at this time.    Past Medical History:  Diagnosis Date   Arthritis    Knees   COPD (chronic obstructive pulmonary disease) (HCC)    Does use hearing aid    Bilateral   Enlarged prostate    GERD (gastroesophageal reflux disease)    Hypercholesterolemia     Past Surgical History:  Procedure Laterality Date   CHEEK AUGMENTATION Left 1970   REstructure of cheek bone    COLONOSCOPY  2017   Dr. Alphonsa Overall   COLONOSCOPY WITH PROPOFOL N/A 05/05/2022   Procedure: COLONOSCOPY WITH PROPOFOL;  Surgeon: Midge Minium, MD;  Location: [facility] ENDOSCOPY;  Service: Endoscopy;  Laterality: N/A;   ESOPHAGOGASTRODUODENOSCOPY (EGD) WITH PROPOFOL N/A 12/05/2020   Procedure: ESOPHAGOGASTRODUODENOSCOPY (EGD) WITH PROPOFOL;  Surgeon: Midge Minium, MD;  Location: [facility] ENDOSCOPY;  Service: Endoscopy;  Laterality: N/A;   EYE SURGERY Bilateral 2005   NASAL SEPTOPLASTY W/ TURBINOPLASTY Bilateral 12/24/2020   Procedure: NASAL SEPTOPLASTY WITH INFERIOR TURBINATE REDUCTION;  Surgeon: Bud Face, MD;  Location: [facility] ORS;  Service: ENT;  Laterality: Bilateral;   UPPER GI ENDOSCOPY      Allergies  Allergen Reactions   Bee Pollen Anaphylaxis    Bee Venom    Bee Venom     Throat/mouth swelling    Outpatient Encounter Medications as of 01/13/2023  Medication Sig   acetaminophen (TYLENOL) 500 MG tablet Take 500 mg by mouth as needed.   albuterol (PROVENTIL) (2.5 MG/3ML) 0.083% nebulizer solution Take 3 mLs (2.5 mg total) by nebulization every 6 (six) hours as needed for wheezing or shortness of breath.   Apoaequorin (PREVAGEN) 10 MG CAPS Take 1 capsule by mouth daily.    Ascorbic Acid (VITAMIN C) 1000 MG tablet Take 1,000 mg by mouth daily.   benzonatate (TESSALON) 200 MG capsule Take 1 capsule (200 mg total) by mouth 3 (three) times daily as needed.   Cholecalciferol (VITAMIN D3) 125 MCG (5000 UT) CAPS Take by mouth.   clotrimazole-betamethasone (LOTRISONE) cream Apply 1 Application topically 2 (two) times daily as needed.   cyclobenzaprine (FLEXERIL) 10 MG tablet TAKE 1 TABLET BY MOUTH TWICE  DAILY AS NEEDED FOR MUSCLE  SPASM(S)   escitalopram (LEXAPRO) 5 MG tablet Take 10 mg by mouth daily.   fexofenadine (ALLEGRA) 180 MG tablet Take 180 mg by mouth daily as needed.   finasteride (PROSCAR) 5 MG tablet TAKE 1 TABLET EVERY DAY   fluticasone (FLONASE) 50 MCG/ACT nasal spray USE 1 SPRAY IN BOTH  NOSTRILS DAILY   melatonin 5 MG TABS Take 5 mg by mouth at bedtime.    Methylsulfonylmethane (MSM) 1000 MG CAPS Take  1 capsule by mouth 2 (two) times daily.   Multiple Vitamins-Minerals (MULTIVITAMIN MEN 50+) TABS Take 1 tablet by mouth daily.   nabumetone (RELAFEN) 750 MG tablet TAKE 1 TABLET TWICE DAILY AS NEEDED   pantoprazole (PROTONIX) 40 MG tablet TAKE 1 TABLET ONE TIME DAILY   selenium 200 MCG TABS tablet Take by mouth daily.   simvastatin (ZOCOR) 40 MG tablet TAKE 1 TABLET BY MOUTH  DAILY   tamsulosin (FLOMAX) 0.4 MG CAPS capsule TAKE 1 CAPSULE EVERY DAY   temazepam (RESTORIL) 15 MG capsule TAKE 1 CAPSULE BY MOUTH AT  BEDTIME AS NEEDED FOR SLEEP   Testosterone 2 MG/24HR PT24 Place 1 patch onto the skin as needed.   Tiotropium Bromide Monohydrate  (SPIRIVA RESPIMAT) 2.5 MCG/ACT AERS Inhale 2 puffs into the lungs daily.   triamcinolone cream (KENALOG) 0.1 % APPLY  CREAM EXTERNALLY TO AFFECTED AREA TWICE DAILY   vitamin B-12 (CYANOCOBALAMIN) 500 MCG tablet Take 500 mcg by mouth daily.   [DISCONTINUED] predniSONE (DELTASONE) 20 MG tablet Take 1 tablet (20 mg total) by mouth daily with breakfast.   [DISCONTINUED] Tiotropium Bromide-Olodaterol (STIOLTO RESPIMAT) 2.5-2.5 MCG/ACT AERS Inhale 2 puffs into the lungs daily.   No facility-administered encounter medications on file as of 01/13/2023.    Review of Systems:  Review of Systems  Health Maintenance  Topic Date Due   COVID-19 Vaccine (10 - 2023-24 season) 12/15/2022   Medicare Annual Wellness (AWV)  01/28/2023   INFLUENZA VACCINE  02/11/2023   Colonoscopy  05/06/2027   DTaP/Tdap/Td (3 - Td or Tdap) 12/20/2032   Pneumonia Vaccine 70+ Years old  Completed   Hepatitis C Screening  Completed   Zoster Vaccines- Shingrix  Completed   HPV VACCINES  Aged Out    Physical Exam: Vitals:   01/12/23 1621  BP: 138/86  Pulse: 67  Temp: 98.2 F (36.8 C)  TempSrc: Temporal  SpO2: 99%  Weight: 191 lb (86.6 kg)  Height: 6\' 1"  (1.854 m)   Body mass index is 25.2 kg/m. Physical Exam Vitals reviewed.  Constitutional:      Appearance: Normal appearance.  Cardiovascular:     Rate and Rhythm: Normal rate and regular rhythm.     Pulses: Normal pulses.     Heart sounds: Normal heart sounds.  Pulmonary:     Effort: Pulmonary effort is normal.     Breath sounds: Normal breath sounds.  Neurological:     Mental Status: He is alert and oriented to person, place, and time.  Psychiatric:     Comments: Denies SI/HI     Labs reviewed: Basic Metabolic Panel: Recent Labs    02/02/22 0000 08/27/22 0000  NA 137 138  K 4.0 4.0  CL 107 104  CO2 24* 27*  BUN 19 20  CREATININE 0.9 0.8  CALCIUM 9.6 9.9   Liver Function Tests: Recent Labs    02/02/22 0000 08/27/22 0000  AST 18 20   ALT 18 20  ALKPHOS 60 65  ALBUMIN 4.3 4.5   No results for input(s): "LIPASE", "AMYLASE" in the last 8760 hours. No results for input(s): "AMMONIA" in the last 8760 hours. CBC: Recent Labs    02/02/22 0000 08/27/22 0000  WBC 5.6 5.7  NEUTROABS  --  2,690.00  HGB 15.1 16.2  HCT 42 46  PLT 119* 130*   Lipid Panel: Recent Labs    02/02/22 0000 08/27/22 0000  CHOL 143 189  HDL 38 37  LDLCALC 83 120  TRIG 127 199*  No results found for: "HGBA1C"  Procedures since last visit: No results found.  Assessment/Plan Grief - Plan: escitalopram (LEXAPRO) 10 MG tablet  Anxiety - Plan: escitalopram (LEXAPRO) 10 MG tablet Denies thoughts of hurting himself or anyone else. Increase dose of lexparo to 10 mg for 1 week then increase by 5mg  for the next 2 week to 20 mg. F/u in 1 mo.   Labs/tests ordered:  * No order type specified * Next appt:  01/19/2023

## 2023-01-13 NOTE — Patient Instructions (Addendum)
Please take 1 tablet (10 mg) for the next 7 days   On 7/10 increase to 1.5 tablets (15 mg).  On 7/17 Start taking 2 tablets (20 mg).   We can discuss your mood at your next appointment in 1 month.

## 2023-01-19 ENCOUNTER — Ambulatory Visit: Payer: Medicare PPO | Admitting: Nurse Practitioner

## 2023-01-21 ENCOUNTER — Encounter: Payer: Medicare PPO | Admitting: Nurse Practitioner

## 2023-02-02 ENCOUNTER — Encounter: Payer: Medicare Other | Admitting: Nurse Practitioner

## 2023-02-12 ENCOUNTER — Ambulatory Visit: Payer: Medicare PPO | Admitting: Student

## 2023-02-16 ENCOUNTER — Other Ambulatory Visit: Payer: Self-pay | Admitting: Student

## 2023-02-16 DIAGNOSIS — F4321 Adjustment disorder with depressed mood: Secondary | ICD-10-CM

## 2023-02-16 DIAGNOSIS — F419 Anxiety disorder, unspecified: Secondary | ICD-10-CM

## 2023-02-16 NOTE — Telephone Encounter (Signed)
Pharmacy requested refill. Pended Rx and sent to Jessica for approval due to HIGH ALERT Warning.  

## 2023-02-24 ENCOUNTER — Encounter: Payer: Self-pay | Admitting: Student

## 2023-02-24 ENCOUNTER — Ambulatory Visit: Payer: Medicare HMO | Admitting: Student

## 2023-02-24 VITALS — BP 138/86 | HR 62 | Temp 98.2°F | Ht 73.0 in | Wt 193.0 lb

## 2023-02-24 DIAGNOSIS — F4321 Adjustment disorder with depressed mood: Secondary | ICD-10-CM | POA: Diagnosis not present

## 2023-02-24 DIAGNOSIS — D696 Thrombocytopenia, unspecified: Secondary | ICD-10-CM

## 2023-02-24 DIAGNOSIS — F419 Anxiety disorder, unspecified: Secondary | ICD-10-CM

## 2023-02-24 MED ORDER — ESCITALOPRAM OXALATE 20 MG PO TABS: 20.0000 mg | ORAL_TABLET | Freq: Every day | ORAL | 1 refills | Status: DC

## 2023-02-24 NOTE — Patient Instructions (Signed)
I'm glad your mood has improved. Please continue this dose of medications for the next 6 months.   You can follow up with myself or Shanda Bumps in 6 months for this.

## 2023-02-24 NOTE — Progress Notes (Signed)
Location:  [facility] [place] [facility].   Provider: Dr. Earnestine Mealing  Code Status: Full Code Goals of Care:     02/24/2023    1:18 PM  Advanced Directives  Does Patient Have a Medical Advance Directive? Yes  Type of Advance Directive Out of facility DNR (pink MOST or yellow form)  Does patient want to make changes to medical advance directive? No - Patient declined     Chief Complaint  Patient presents with   Medical Management of Chronic Issues    Medical Management of Chronic Issues. 1 Month Follow up   Quality Metric Gaps    To discuss need for Covid, Flu     HPI: Patient is a 77 y.o. male seen today for medical management of chronic diseases.    Time has helped but he is grateful for the medication to help with coping with his loss. They are going to enter ashes at [place] and place a bench on the walkway to commeorate his wife. Her daughter is an ordained     Past Medical History:  Diagnosis Date   Arthritis    Knees   COPD (chronic obstructive pulmonary disease) (HCC)    Does use hearing aid    Bilateral   Enlarged prostate    GERD (gastroesophageal reflux disease)    Hypercholesterolemia     Past Surgical History:  Procedure Laterality Date   CHEEK AUGMENTATION Left 1970   REstructure of cheek bone    COLONOSCOPY  2017   Dr. Alphonsa Overall   COLONOSCOPY WITH PROPOFOL N/A 05/05/2022   Procedure: COLONOSCOPY WITH PROPOFOL;  Surgeon: Midge Minium, MD;  Location: [facility] ENDOSCOPY;  Service: Endoscopy;  Laterality: N/A;   ESOPHAGOGASTRODUODENOSCOPY (EGD) WITH PROPOFOL N/A 12/05/2020   Procedure: ESOPHAGOGASTRODUODENOSCOPY (EGD) WITH PROPOFOL;  Surgeon: Midge Minium, MD;  Location: [facility] ENDOSCOPY;  Service: Endoscopy;  Laterality: N/A;   EYE SURGERY Bilateral 2005   NASAL SEPTOPLASTY W/ TURBINOPLASTY Bilateral 12/24/2020   Procedure: NASAL SEPTOPLASTY WITH INFERIOR TURBINATE REDUCTION;  Surgeon: Bud Face, MD;  Location: [facility] ORS;  Service: ENT;  Laterality:  Bilateral;   UPPER GI ENDOSCOPY      Allergies  Allergen Reactions   Bee Pollen Anaphylaxis    Bee Venom   Bee Venom     Throat/mouth swelling    Outpatient Encounter Medications as of 02/24/2023  Medication Sig   albuterol (PROVENTIL) (2.5 MG/3ML) 0.083% nebulizer solution Take 3 mLs (2.5 mg total) by nebulization every 6 (six) hours as needed for wheezing or shortness of breath.   Apoaequorin (PREVAGEN) 10 MG CAPS Take 1 capsule by mouth daily.    Ascorbic Acid (VITAMIN C) 1000 MG tablet Take 1,000 mg by mouth daily.   Cholecalciferol (VITAMIN D3) 125 MCG (5000 UT) CAPS Take by mouth.   clotrimazole-betamethasone (LOTRISONE) cream Apply 1 Application topically 2 (two) times daily as needed.   cyclobenzaprine (FLEXERIL) 10 MG tablet TAKE 1 TABLET BY MOUTH TWICE  DAILY AS NEEDED FOR MUSCLE  SPASM(S)   escitalopram (LEXAPRO) 20 MG tablet Take 20 mg by mouth daily.   finasteride (PROSCAR) 5 MG tablet TAKE 1 TABLET EVERY DAY   fluticasone (FLONASE) 50 MCG/ACT nasal spray USE 1 SPRAY IN BOTH  NOSTRILS DAILY   melatonin 5 MG TABS Take 5 mg by mouth at bedtime.    Methylsulfonylmethane (MSM) 1000 MG CAPS Take 1 capsule by mouth 2 (two) times daily.   Multiple Vitamins-Minerals (MULTIVITAMIN MEN 50+) TABS Take 1 tablet by mouth daily.  nabumetone (RELAFEN) 750 MG tablet TAKE 1 TABLET TWICE DAILY AS NEEDED   pantoprazole (PROTONIX) 40 MG tablet TAKE 1 TABLET ONE TIME DAILY   selenium 200 MCG TABS tablet Take by mouth daily.   simvastatin (ZOCOR) 40 MG tablet TAKE 1 TABLET BY MOUTH  DAILY   tamsulosin (FLOMAX) 0.4 MG CAPS capsule TAKE 1 CAPSULE EVERY DAY   temazepam (RESTORIL) 15 MG capsule TAKE 1 CAPSULE BY MOUTH AT  BEDTIME AS NEEDED FOR SLEEP   Testosterone 2 MG/24HR PT24 Place 1 patch onto the skin as needed.   Tiotropium Bromide Monohydrate (SPIRIVA RESPIMAT) 2.5 MCG/ACT AERS Inhale 2 puffs into the lungs daily.   triamcinolone cream (KENALOG) 0.1 % APPLY  CREAM EXTERNALLY TO AFFECTED  AREA TWICE DAILY   vitamin B-12 (CYANOCOBALAMIN) 500 MCG tablet Take 500 mcg by mouth daily.   [DISCONTINUED] acetaminophen (TYLENOL) 500 MG tablet Take 500 mg by mouth as needed.   [DISCONTINUED] benzonatate (TESSALON) 200 MG capsule Take 1 capsule (200 mg total) by mouth 3 (three) times daily as needed.   [DISCONTINUED] escitalopram (LEXAPRO) 10 MG tablet Take 1 tablet by mouth once daily   [DISCONTINUED] fexofenadine (ALLEGRA) 180 MG tablet Take 180 mg by mouth daily as needed.   No facility-administered encounter medications on file as of 02/24/2023.    Review of Systems:  Review of Systems  Health Maintenance  Topic Date Due   COVID-19 Vaccine (10 - 2023-24 season) 12/15/2022   Medicare Annual Wellness (AWV)  01/28/2023   INFLUENZA VACCINE  02/11/2023   Colonoscopy  05/06/2027   DTaP/Tdap/Td (3 - Td or Tdap) 12/20/2032   Pneumonia Vaccine 28+ Years old  Completed   Hepatitis C Screening  Completed   Zoster Vaccines- Shingrix  Completed   HPV VACCINES  Aged Out    Physical Exam: Vitals:   02/24/23 1313  BP: 138/86  Pulse: 62  Temp: 98.2 F (36.8 C)  SpO2: 97%  Weight: 193 lb (87.5 kg)  Height: 6\' 1"  (1.854 m)   Body mass index is 25.46 kg/m. Physical Exam Constitutional:      Appearance: Normal appearance.  Cardiovascular:     Rate and Rhythm: Normal rate and regular rhythm.     Pulses: Normal pulses.     Heart sounds: Normal heart sounds.  Pulmonary:     Effort: Pulmonary effort is normal.     Breath sounds: Normal breath sounds.  Neurological:     Mental Status: He is alert and oriented to person, place, and time.  Psychiatric:        Mood and Affect: Mood normal.        Behavior: Behavior normal.        Thought Content: Thought content normal.        Judgment: Judgment normal.     Labs reviewed: Basic Metabolic Panel: Recent Labs    08/27/22 0000  NA 138  K 4.0  CL 104  CO2 27*  BUN 20  CREATININE 0.8  CALCIUM 9.9   Liver Function  Tests: Recent Labs    08/27/22 0000  AST 20  ALT 20  ALKPHOS 65  ALBUMIN 4.5   No results for input(s): "LIPASE", "AMYLASE" in the last 8760 hours. No results for input(s): "AMMONIA" in the last 8760 hours. CBC: Recent Labs    08/27/22 0000  WBC 5.7  NEUTROABS 2,690.00  HGB 16.2  HCT 46  PLT 130*   Lipid Panel: Recent Labs    08/27/22 0000  CHOL 189  HDL 37  LDLCALC 120  TRIG 540*   No results found for: "HGBA1C"  Procedures since last visit: No results found.  Assessment/Plan Anxiety - Plan: escitalopram (LEXAPRO) 20 MG tablet  Grief - Plan: escitalopram (LEXAPRO) 20 MG tablet  Thrombocytopenia (HCC), Chronic Rx sent for patient's medication at higher dose given improvement. Discussed he should stay on this dose for at least 6 months and reassess a decrease given improvement on this dose. Last CBC 39mo ago, >50k platelets, continue routine checks.   Labs/tests ordered:  * No order type specified * Next appt:  03/02/2023

## 2023-03-02 ENCOUNTER — Ambulatory Visit (INDEPENDENT_AMBULATORY_CARE_PROVIDER_SITE_OTHER): Payer: Medicare HMO | Admitting: Nurse Practitioner

## 2023-03-02 ENCOUNTER — Encounter: Payer: Self-pay | Admitting: Nurse Practitioner

## 2023-03-02 VITALS — BP 144/82 | HR 72 | Temp 98.4°F | Ht 73.0 in | Wt 193.0 lb

## 2023-03-02 DIAGNOSIS — Z0000 Encounter for general adult medical examination without abnormal findings: Secondary | ICD-10-CM | POA: Diagnosis not present

## 2023-03-02 NOTE — Patient Instructions (Signed)
  Joshua Mercado , Thank you for taking time to come for your Medicare Wellness Visit. I appreciate your ongoing commitment to your health goals. Please review the following plan we discussed and let me know if I can assist you in the future.   These are the goals we discussed:  Goals      Exercise     To continue to exercise         This is a list of the screening recommended for you and due dates:  Health Maintenance  Topic Date Due   COVID-19 Vaccine (10 - 2023-24 season) 12/15/2022   Flu Shot  02/11/2023   Medicare Annual Wellness Visit  03/01/2024   Colon Cancer Screening  05/06/2027   DTaP/Tdap/Td vaccine (3 - Td or Tdap) 12/20/2032   Pneumonia Vaccine  Completed   Hepatitis C Screening  Completed   Zoster (Shingles) Vaccine  Completed   HPV Vaccine  Aged Out

## 2023-03-02 NOTE — Progress Notes (Signed)
Subjective:   Bronxton Aldous is a 77 y.o. male who presents for Medicare Annual/Subsequent preventive examination.  Visit Complete: In person TL    Review of Systems     Cardiac Risk Factors include: advanced age (>33men, >55 women);male gender;dyslipidemia     Objective:    Today's Vitals   03/02/23 1024 03/02/23 1030  BP: (!) 144/86 (!) 144/82  Pulse: 72   Temp: 98.4 F (36.9 C)   SpO2: 94%   Weight: 193 lb (87.5 kg)   Height: 6\' 1"  (1.854 m)    Body mass index is 25.46 kg/m.     03/02/2023   10:27 AM 02/24/2023    1:18 PM 01/13/2023   10:30 AM 01/12/2023    4:21 PM 12/29/2022    8:38 AM 12/22/2022   10:24 AM 12/15/2022   10:17 AM  Advanced Directives  Does Patient Have a Medical Advance Directive? Yes Yes Yes Yes Yes Yes Yes  Type of Advance Directive Out of facility DNR (pink MOST or yellow form) Out of facility DNR (pink MOST or yellow form) Out of facility DNR (pink MOST or yellow form) Out of facility DNR (pink MOST or yellow form) Out of facility DNR (pink MOST or yellow form) Out of facility DNR (pink MOST or yellow form) Out of facility DNR (pink MOST or yellow form)  Does patient want to make changes to medical advance directive? No - Patient declined No - Patient declined No - Patient declined No - Patient declined No - Patient declined No - Patient declined No - Patient declined  Pre-existing out of facility DNR order (yellow form or pink MOST form)   Pink MOST form placed in chart (order not valid for inpatient use) Pink MOST form placed in chart (order not valid for inpatient use)       Current Medications (verified) Outpatient Encounter Medications as of 03/02/2023  Medication Sig   albuterol (PROVENTIL) (2.5 MG/3ML) 0.083% nebulizer solution Take 3 mLs (2.5 mg total) by nebulization every 6 (six) hours as needed for wheezing or shortness of breath.   Apoaequorin (PREVAGEN) 10 MG CAPS Take 1 capsule by mouth daily.    Ascorbic Acid (VITAMIN C) 1000 MG tablet Take  1,000 mg by mouth daily.   Cholecalciferol (VITAMIN D3) 125 MCG (5000 UT) CAPS Take by mouth.   clotrimazole-betamethasone (LOTRISONE) cream Apply 1 Application topically 2 (two) times daily as needed.   cyclobenzaprine (FLEXERIL) 10 MG tablet TAKE 1 TABLET BY MOUTH TWICE  DAILY AS NEEDED FOR MUSCLE  SPASM(S)   escitalopram (LEXAPRO) 20 MG tablet Take 1 tablet (20 mg total) by mouth daily.   finasteride (PROSCAR) 5 MG tablet TAKE 1 TABLET EVERY DAY   fluticasone (FLONASE) 50 MCG/ACT nasal spray USE 1 SPRAY IN BOTH  NOSTRILS DAILY   melatonin 5 MG TABS Take 5 mg by mouth at bedtime.    Methylsulfonylmethane (MSM) 1000 MG CAPS Take 1 capsule by mouth 2 (two) times daily.   Multiple Vitamins-Minerals (MULTIVITAMIN MEN 50+) TABS Take 1 tablet by mouth daily.   nabumetone (RELAFEN) 750 MG tablet TAKE 1 TABLET TWICE DAILY AS NEEDED   pantoprazole (PROTONIX) 40 MG tablet TAKE 1 TABLET ONE TIME DAILY   selenium 200 MCG TABS tablet Take by mouth daily.   simvastatin (ZOCOR) 40 MG tablet TAKE 1 TABLET BY MOUTH  DAILY   tamsulosin (FLOMAX) 0.4 MG CAPS capsule TAKE 1 CAPSULE EVERY DAY   temazepam (RESTORIL) 15 MG capsule TAKE 1 CAPSULE BY MOUTH  AT  BEDTIME AS NEEDED FOR SLEEP   Testosterone 2 MG/24HR PT24 Place 1 patch onto the skin as needed.   Tiotropium Bromide Monohydrate (SPIRIVA RESPIMAT) 2.5 MCG/ACT AERS Inhale 2 puffs into the lungs daily.   triamcinolone cream (KENALOG) 0.1 % APPLY  CREAM EXTERNALLY TO AFFECTED AREA TWICE DAILY   vitamin B-12 (CYANOCOBALAMIN) 500 MCG tablet Take 500 mcg by mouth daily.   No facility-administered encounter medications on file as of 03/02/2023.    Allergies (verified) Bee pollen and Bee venom   History: Past Medical History:  Diagnosis Date   Arthritis    Knees   COPD (chronic obstructive pulmonary disease) (HCC)    Does use hearing aid    Bilateral   Enlarged prostate    GERD (gastroesophageal reflux disease)    Hypercholesterolemia    Past Surgical  History:  Procedure Laterality Date   CHEEK AUGMENTATION Left 1970   REstructure of cheek bone    COLONOSCOPY  2017   Dr. Alphonsa Overall   COLONOSCOPY WITH PROPOFOL N/A 05/05/2022   Procedure: COLONOSCOPY WITH PROPOFOL;  Surgeon: Midge Minium, MD;  Location: [facility] ENDOSCOPY;  Service: Endoscopy;  Laterality: N/A;   ESOPHAGOGASTRODUODENOSCOPY (EGD) WITH PROPOFOL N/A 12/05/2020   Procedure: ESOPHAGOGASTRODUODENOSCOPY (EGD) WITH PROPOFOL;  Surgeon: Midge Minium, MD;  Location: [facility] ENDOSCOPY;  Service: Endoscopy;  Laterality: N/A;   EYE SURGERY Bilateral 2005   NASAL SEPTOPLASTY W/ TURBINOPLASTY Bilateral 12/24/2020   Procedure: NASAL SEPTOPLASTY WITH INFERIOR TURBINATE REDUCTION;  Surgeon: Bud Face, MD;  Location: [facility] ORS;  Service: ENT;  Laterality: Bilateral;   UPPER GI ENDOSCOPY     Family History  Problem Relation Age of Onset   Alzheimer's disease Mother    Diabetes Mother    Heart failure Father    Heart attack Father    Diabetes Father    Dementia Father    Social History   Socioeconomic History   Marital status: Married    Spouse name: Not on file   Number of children: Not on file   Years of education: Not on file   Highest education level: Not on file  Occupational History   Not on file  Tobacco Use   Smoking status: Former    Current packs/day: 0.00    Average packs/day: 1 pack/day for 35.0 years (35.0 ttl pk-yrs)    Types: Cigarettes    Start date: 49    Quit date: 76    Years since quitting: 39.6   Smokeless tobacco: Never  Vaping Use   Vaping status: Never Used  Substance and Sexual Activity   Alcohol use: Not Currently   Drug use: Never   Sexual activity: Not on file  Other Topics Concern   Not on file  Social History Narrative   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  Wife is a retired Development worker, community.    Social Determinants of Health   Financial Resource Strain: Not on file  Food Insecurity: Not on file  Transportation Needs: Not on file   Physical Activity: Not on file  Stress: Not on file  Social Connections: Unknown (04/21/2022)   Received from [employer] [facility]   Family and [employer] Support    Help with Day-to-Day Activities: Not on file    Lonely or Isolated: Not on file    Tobacco Counseling Counseling given: Not Answered   Clinical Intake:  Pre-visit preparation completed: Yes  Pain : No/denies pain     BMI - recorded: 25 Nutritional Status:  BMI 25 -29 Overweight Nutritional Risks: Unintentional weight loss Diabetes: No  How often do you need to have someone help you when you read instructions, pamphlets, or other written materials from your doctor or pharmacy?: 1 - Never         Activities of Daily Living    03/02/2023   10:40 AM  In your present state of health, do you have any difficulty performing the following activities:  Hearing? 1  Vision? 0  Difficulty concentrating or making decisions? 0  Walking or climbing stairs? 0  Dressing or bathing? 0  Doing errands, shopping? 0  Preparing Food and eating ? N  Using the Toilet? N  In the past six months, have you accidently leaked urine? N  Do you have problems with loss of bowel control? N  Managing your Medications? N  Managing your Finances? N  Housekeeping or managing your Housekeeping? N    Patient Care Team: Sharon Seller, NP as PCP - General (Geriatric Medicine) Debbe Odea, MD as PCP - Cardiology (Cardiology)  Indicate any recent Medical Services you may have received from other than [facility] providers in the past year (date may be approximate).     Assessment:   This is a routine wellness examination for Algis.  Hearing/Vision screen Vision Screening - Comments:: [facility] 2024  Dietary issues and exercise activities discussed:     Goals Addressed             This Visit's Progress    Exercise       To continue to exercise        Depression Screen     03/02/2023   10:25 AM 12/22/2022   10:25 AM 11/17/2022    8:58 AM 10/22/2022    8:21 AM 02/03/2022   10:46 AM 01/27/2022    8:43 AM 01/22/2021   11:02 AM  PHQ 2/9 Scores  PHQ - 2 Score 0 2 0 0 0 0 0  PHQ- 9 Score  3         Fall Risk    03/02/2023   10:25 AM 01/13/2023    1:42 PM 12/22/2022   10:25 AM 11/17/2022    8:58 AM 10/22/2022    8:21 AM  Fall Risk   Falls in the past year? 1 0 0 0 0  Number falls in past yr: 0 0 0 0 0  Injury with Fall? 0 0 0 0 0  Risk for fall due to :  No Fall Risks     Follow up  Falls evaluation completed       MEDICARE RISK AT HOME:    TIMED UP AND GO:  Was the test performed?  No    Cognitive Function:        03/02/2023   10:27 AM 01/27/2022    8:45 AM 01/22/2021   11:05 AM 01/16/2020    9:38 AM  6CIT Screen  What Year? 0 points 0 points 0 points 0 points  What month? 0 points 0 points 0 points 0 points  What time? 0 points 0 points 0 points 0 points  Count back from 20 0 points 0 points 0 points 0 points  Months in reverse 0 points 0 points 0 points 0 points  Repeat phrase 2 points 2 points 2 points 2 points  Total Score 2 points 2 points 2 points 2 points    Immunizations Immunization History  Administered Date(s) Administered   Covid-19, Mrna,Vaccine(Spikevax)75yrs and older 10/20/2022  Influenza, High Dose Seasonal PF 04/11/2015, 04/15/2016   Influenza-Unspecified 05/14/2019, 05/02/2020, 04/24/2021, 04/10/2022   Moderna Covid-19 Vaccine Bivalent Booster 58yrs & up 04/22/2021, 12/09/2021, 10/20/2022   Moderna Sars-Covid-2 Vaccination 07/25/2019, 08/16/2019, 09/13/2019, 05/28/2020, 11/28/2020   PNEUMOCOCCAL CONJUGATE-20 01/14/2023   Pneumococcal Conjugate-13 04/13/2015, 05/13/2015   Pneumococcal Polysaccharide-23 11/24/2012   RSV,unspecified 06/11/2022   Tdap 12/11/2016, 12/21/2022   Unspecified SARS-COV-2 Vaccination 06/11/2022   Zoster Recombinant(Shingrix) 03/18/2022, 12/15/2022   Zoster, Live 06/13/2007    TDAP status: Up to  date  Flu Vaccine status: Due, Education has been provided regarding the importance of this vaccine. Advised may receive this vaccine at local pharmacy or Health Dept. Aware to provide a copy of the vaccination record if obtained from local pharmacy or Health Dept. Verbalized acceptance and understanding.  Pneumococcal vaccine status: Up to date  Covid-19 vaccine status: Information provided on how to obtain vaccines.   Qualifies for Shingles Vaccine? Yes   Zostavax completed No   Shingrix Completed?: Yes  Screening Tests Health Maintenance  Topic Date Due   COVID-19 Vaccine (10 - 2023-24 season) 12/15/2022   INFLUENZA VACCINE  02/11/2023   Medicare Annual Wellness (AWV)  03/01/2024   Colonoscopy  05/06/2027   DTaP/Tdap/Td (3 - Td or Tdap) 12/20/2032   Pneumonia Vaccine 51+ Years old  Completed   Hepatitis C Screening  Completed   Zoster Vaccines- Shingrix  Completed   HPV VACCINES  Aged Out    Health Maintenance  Health Maintenance Due  Topic Date Due   COVID-19 Vaccine (10 - 2023-24 season) 12/15/2022   INFLUENZA VACCINE  02/11/2023    Colorectal cancer screening: No longer required.   Lung Cancer Screening: (Low Dose CT Chest recommended if Age 48-80 years, 20 pack-year currently smoking OR have quit w/in 15years.) does not qualify.   Lung Cancer Screening Referral: na  Additional Screening:  Hepatitis C Screening: does qualify  Vision Screening: Recommended annual ophthalmology exams for early detection of glaucoma and other disorders of the eye. Is the patient up to date with their annual eye exam?  yes Who is the provider or what is the name of the office in which the patient attends annual eye exams? [facility] If pt is not established with a provider, would they like to be referred to a provider to establish care? No .   Dental Screening: Recommended annual dental exams for proper oral hygiene   Community Resource Referral / Chronic Care Management: CRR  required this visit?  No   CCM required this visit?  No     Plan:     I have personally reviewed and noted the following in the patient's chart:   Medical and social history Use of alcohol, tobacco or illicit drugs  Current medications and supplements including opioid prescriptions. Patient is not currently taking opioid prescriptions. Functional ability and status Nutritional status Physical activity Advanced directives List of other physicians Hospitalizations, surgeries, and ER visits in previous 12 months Vitals Screenings to include cognitive, depression, and falls Referrals and appointments  In addition, I have reviewed and discussed with patient certain preventive protocols, quality metrics, and best practice recommendations. A written personalized care plan for preventive services as well as general preventive health recommendations were provided to patient.     Sharon Seller, NP   03/02/2023

## 2023-03-12 ENCOUNTER — Encounter: Payer: Self-pay | Admitting: Student

## 2023-03-12 ENCOUNTER — Ambulatory Visit: Payer: Medicare HMO | Admitting: Student

## 2023-03-12 VITALS — BP 138/84 | HR 62 | Temp 98.6°F | Ht 73.0 in | Wt 191.0 lb

## 2023-03-12 DIAGNOSIS — R55 Syncope and collapse: Secondary | ICD-10-CM

## 2023-03-12 NOTE — Progress Notes (Signed)
[facility]  Provider: Macyn Shropshire  Code Status: Full Code Goals of Care:     03/12/2023   11:47 AM  Advanced Directives  Does Patient Have a Medical Advance Directive? Yes  Type of Advance Directive Out of facility DNR (pink MOST or yellow form)  Does patient want to make changes to medical advance directive? No - Patient declined  Pre-existing out of facility DNR order (yellow form or pink MOST form) Pink MOST form placed in chart (order not valid for inpatient use)     Chief Complaint  Patient presents with   Acute Visit    Dizzy episode about 3 am. Patient felt as though he was going to fall and tried to break the fall, however he went to the ground and heard his right shoulder hit the floor. Moderate fall risk. Discuss new supplement x 30 days    HPI: Patient is a 77 y.o. male seen today for an acute visit for dizziness.   He felt himself swaying he was going around and round and he bent his knees and went back on his bottom. He hit his shoulder. He felt like he was awake the entire time. All of a sudden everything was okay. One reason he went down on the floor was because he was afraid he would go down.   He has been taking a new supplement. [place] having wondering if he needs some walking sticks.   He did check his blood pressure last night and his blood pressure was 80 on the top and then after a recheck it came up to 138/78.   New supplement for 1 mo has horny goat weed which can cause a drop in your blood pressure. He has 3 months of the supplement.   He drinks 2 cups of coffee per day.   He doesn't take tamazepam often. He has taken flomax for a long time.   He has extra "ticks" on his   Past Medical History:  Diagnosis Date   Arthritis    Knees   COPD (chronic obstructive pulmonary disease) (HCC)    Does use hearing aid    Bilateral   Enlarged prostate    GERD (gastroesophageal reflux disease)    Hypercholesterolemia      Past Surgical History:  Procedure Laterality Date   CHEEK AUGMENTATION Left 1970   REstructure of cheek bone    COLONOSCOPY  2017   Dr. Alphonsa Overall   COLONOSCOPY WITH PROPOFOL N/A 05/05/2022   Procedure: COLONOSCOPY WITH PROPOFOL;  Surgeon: Midge Minium, MD;  Location: [facility] ENDOSCOPY;  Service: Endoscopy;  Laterality: N/A;   ESOPHAGOGASTRODUODENOSCOPY (EGD) WITH PROPOFOL N/A 12/05/2020   Procedure: ESOPHAGOGASTRODUODENOSCOPY (EGD) WITH PROPOFOL;  Surgeon: Midge Minium, MD;  Location: [facility] ENDOSCOPY;  Service: Endoscopy;  Laterality: N/A;   EYE SURGERY Bilateral 2005   NASAL SEPTOPLASTY W/ TURBINOPLASTY Bilateral 12/24/2020   Procedure: NASAL SEPTOPLASTY WITH INFERIOR TURBINATE REDUCTION;  Surgeon: Bud Face, MD;  Location: [facility] ORS;  Service: ENT;  Laterality: Bilateral;   UPPER GI ENDOSCOPY      Allergies  Allergen Reactions   Bee Pollen Anaphylaxis    Bee Venom   Bee Venom     Throat/mouth swelling    Outpatient Encounter Medications as of 03/12/2023  Medication Sig   albuterol (PROVENTIL) (2.5 MG/3ML) 0.083% nebulizer solution Take 3 mLs (2.5 mg total) by nebulization every 6 (six) hours as needed for wheezing or shortness of breath.  Apoaequorin (PREVAGEN) 10 MG CAPS Take 1 capsule by mouth daily.    Ascorbic Acid (VITAMIN C) 1000 MG tablet Take 1,000 mg by mouth daily.   Cholecalciferol (VITAMIN D3) 125 MCG (5000 UT) CAPS Take by mouth.   clotrimazole-betamethasone (LOTRISONE) cream Apply 1 Application topically 2 (two) times daily as needed.   cyclobenzaprine (FLEXERIL) 10 MG tablet TAKE 1 TABLET BY MOUTH TWICE  DAILY AS NEEDED FOR MUSCLE  SPASM(S)   escitalopram (LEXAPRO) 20 MG tablet Take 1 tablet (20 mg total) by mouth daily.   finasteride (PROSCAR) 5 MG tablet TAKE 1 TABLET EVERY DAY   fluticasone (FLONASE) 50 MCG/ACT nasal spray USE 1 SPRAY IN BOTH  NOSTRILS DAILY   melatonin 5 MG TABS Take 5 mg by mouth at bedtime.    Methylsulfonylmethane (MSM) 1000 MG CAPS Take  1 capsule by mouth 2 (two) times daily.   Multiple Vitamins-Minerals (MULTIVITAMIN MEN 50+) TABS Take 1 tablet by mouth daily.   nabumetone (RELAFEN) 750 MG tablet TAKE 1 TABLET TWICE DAILY AS NEEDED   OVER THE COUNTER MEDICATION Take 1 tablet by mouth daily. Boosted Pro Dietary Supplement for ED (gummy formulation)   pantoprazole (PROTONIX) 40 MG tablet TAKE 1 TABLET ONE TIME DAILY   selenium 200 MCG TABS tablet Take by mouth daily.   simvastatin (ZOCOR) 40 MG tablet TAKE 1 TABLET BY MOUTH  DAILY   tamsulosin (FLOMAX) 0.4 MG CAPS capsule TAKE 1 CAPSULE EVERY DAY   temazepam (RESTORIL) 15 MG capsule TAKE 1 CAPSULE BY MOUTH AT  BEDTIME AS NEEDED FOR SLEEP   Testosterone 2 MG/24HR PT24 Place 1 patch onto the skin as needed.   Tiotropium Bromide Monohydrate (SPIRIVA RESPIMAT) 2.5 MCG/ACT AERS Inhale 2 puffs into the lungs daily.   triamcinolone cream (KENALOG) 0.1 % APPLY  CREAM EXTERNALLY TO AFFECTED AREA TWICE DAILY   vitamin B-12 (CYANOCOBALAMIN) 500 MCG tablet Take 500 mcg by mouth daily.   No facility-administered encounter medications on file as of 03/12/2023.    Review of Systems:  Review of Systems  Health Maintenance  Topic Date Due   COVID-19 Vaccine (10 - 2023-24 season) 12/15/2022   INFLUENZA VACCINE  02/11/2023   Medicare Annual Wellness (AWV)  03/01/2024   Colonoscopy  05/06/2027   DTaP/Tdap/Td (3 - Td or Tdap) 12/20/2032   Pneumonia Vaccine 53+ Years old  Completed   Hepatitis C Screening  Completed   Zoster Vaccines- Shingrix  Completed   HPV VACCINES  Aged Out    Physical Exam: Vitals:   03/12/23 1147 03/12/23 1343  BP: (!) 140/90 138/84  Pulse: 62   Temp: 98.6 F (37 C)   TempSrc: Temporal   SpO2: 97%   Weight: 191 lb (86.6 kg)   Height: 6\' 1"  (1.854 m)    Body mass index is 25.2 kg/m. Physical Exam Vitals reviewed.  Constitutional:      Appearance: Normal appearance.  Cardiovascular:     Rate and Rhythm: Normal rate and regular rhythm.     Pulses:  Normal pulses.     Heart sounds: Normal heart sounds.  Pulmonary:     Effort: Pulmonary effort is normal.     Breath sounds: Normal breath sounds.  Musculoskeletal:        General: No swelling.  Skin:    General: Skin is warm and dry.  Neurological:     General: No focal deficit present.     Mental Status: He is alert and oriented to person, place, and time.  Motor: No weakness.     Coordination: Coordination normal.     Gait: Gait normal.     Deep Tendon Reflexes: Reflexes normal.  Psychiatric:        Mood and Affect: Mood normal.     Labs reviewed: Basic Metabolic Panel: Recent Labs    08/27/22 0000  NA 138  K 4.0  CL 104  CO2 27*  BUN 20  CREATININE 0.8  CALCIUM 9.9   Liver Function Tests: Recent Labs    08/27/22 0000  AST 20  ALT 20  ALKPHOS 65  ALBUMIN 4.5   No results for input(s): "LIPASE", "AMYLASE" in the last 8760 hours. No results for input(s): "AMMONIA" in the last 8760 hours. CBC: Recent Labs    08/27/22 0000  WBC 5.7  NEUTROABS 2,690.00  HGB 16.2  HCT 46  PLT 130*   Lipid Panel: Recent Labs    08/27/22 0000  CHOL 189  HDL 37  LDLCALC 120  TRIG 253*   No results found for: "HGBA1C"  Procedures since last visit: No results found.  Assessment/Plan Near syncope Patient with near syncope event. Orthostatic negative today. Discussed concern for supplement containing horny goat extract can cause low blood pressure and advised discontinuation. Denies chest pain or LOC. Less likely to be MI and appears euvolemic on exam. Discussed possibility of TIA, however, he has never had any signs of stroke and strength/sensation grossly intact at this time. Patient also on flomax which can cause ortho stasis. Discussed possible cardiac monitoring however declined since he has had it in the past. Pulmonology f/u in 1 week, could consider repeat ECG at that time. F/u with PCP and ED precautions provided if this occurs again without the supplements.     Labs/tests ordered:  * No order type specified * Next appt:  08/17/2023

## 2023-03-16 ENCOUNTER — Encounter: Payer: Self-pay | Admitting: Adult Health

## 2023-03-16 ENCOUNTER — Ambulatory Visit: Payer: Medicare HMO | Admitting: Adult Health

## 2023-03-16 VITALS — BP 124/68 | HR 67 | Temp 97.9°F | Ht 73.0 in | Wt 192.4 lb

## 2023-03-16 DIAGNOSIS — J449 Chronic obstructive pulmonary disease, unspecified: Secondary | ICD-10-CM

## 2023-03-16 DIAGNOSIS — R55 Syncope and collapse: Secondary | ICD-10-CM | POA: Diagnosis not present

## 2023-03-16 DIAGNOSIS — R42 Dizziness and giddiness: Secondary | ICD-10-CM | POA: Diagnosis not present

## 2023-03-16 DIAGNOSIS — G4733 Obstructive sleep apnea (adult) (pediatric): Secondary | ICD-10-CM | POA: Diagnosis not present

## 2023-03-16 NOTE — Assessment & Plan Note (Addendum)
Episode of lightheadedness - resolved . No further episodes . Denies chest pain . History of irregular heartbeat . Request establish with Cardiology , referral made. Advised if symptoms return to go to urgent care or ER.

## 2023-03-16 NOTE — Assessment & Plan Note (Signed)
CPAP intolerant. 

## 2023-03-16 NOTE — Assessment & Plan Note (Signed)
Compensated on present regimen   Plan  Patient Instructions  Spiriva inhaler 2 puffs daily  Albuterol inhaler or neb As needed   Allegra daily  As needed   Mucinex DM Twice daily as needed.  Tessalon Three times a day  As needed  cough.  Activity as tolerated.  Refer to cardiology  Follow up with 6 months in [place] with Joshua Spano NP or Dr. Belia Mercado and As needed   Please contact office for sooner follow up if symptoms do not improve or worsen or seek emergency care

## 2023-03-16 NOTE — Progress Notes (Signed)
@Patient  ID: Joshua Mercado, male    DOB: 03-29-1946, 77 y.o.   MRN: [id number]  Chief Complaint  Patient presents with   Follow-up    Referring provider: Sharon Seller, NP  HPI: 77 year old male former smoker followed for COPD Medical history significant for sleep apnea-CPAP intolerant  TEST/EVENTS :  PFT 10/31/19 >> FEV1 2.84 (85%), FEV1% 69, TLC 7.45 (97%), DLCO 74%, +BD   Chest Imaging:  CT chest 01/10/14 >> apical scarring, old granulomas, trace BTX in Rt base   Sleep Tests:  HST 02/06/16 >> AHI 16.6, SpO2 low 84% PSG 11/18/17 >> AHI 11.1, SpO2 low 87%, Bipap 17/13 cm H2O  03/16/2023 Follow up: COPD  Patient returns for a 22-month follow-up.  Patient has underlying moderate COPD.  He remains on Spiriva.  Patient says overall breathing is doing okay.  He denies any flare of cough or wheezing.  Denies any increased albuterol use.  Has been under some stress for the last several months as his wife passed away unexpectedly. Recently seen by primary care provider last week for presyncopal episode.  Had been taking some supplements and felt possibly these contributed.  He had orthostatic lightheadedness along with low blood pressure.  Patient says that he has had no further episodes.  Stopped the horny goat weed supplement.  Patient says he has been feeling good.  He denies any chest pain orthopnea or edema.  Patient says he has a history of some irregular heartbeats in the past.  Was supposed to have seen cardiology but he is not sure about the referral to establish.         Allergies  Allergen Reactions   Bee Pollen Anaphylaxis    Bee Venom   Bee Venom     Throat/mouth swelling    Immunization History  Administered Date(s) Administered   Covid-19, Mrna,Vaccine(Spikevax)45yrs and older 10/20/2022   Influenza, High Dose Seasonal PF 04/11/2015, 04/15/2016   Influenza-Unspecified 05/14/2019, 05/02/2020, 04/24/2021, 04/10/2022   Moderna Covid-19 Vaccine Bivalent Booster  48yrs & up 04/22/2021, 12/09/2021, 10/20/2022   Moderna Sars-Covid-2 Vaccination 07/25/2019, 08/16/2019, 09/13/2019, 05/28/2020, 11/28/2020   PNEUMOCOCCAL CONJUGATE-20 01/14/2023   Pneumococcal Conjugate-13 04/13/2015, 05/13/2015   Pneumococcal Polysaccharide-23 11/24/2012   RSV,unspecified 06/11/2022   Tdap 12/11/2016, 12/21/2022   Unspecified SARS-COV-2 Vaccination 06/11/2022   Zoster Recombinant(Shingrix) 03/18/2022, 12/15/2022   Zoster, Live 06/13/2007    Past Medical History:  Diagnosis Date   Arthritis    Knees   COPD (chronic obstructive pulmonary disease) (HCC)    Does use hearing aid    Bilateral   Enlarged prostate    GERD (gastroesophageal reflux disease)    Hypercholesterolemia     Tobacco History: Social History   Tobacco Use  Smoking Status Former   Current packs/day: 0.00   Average packs/day: 1 pack/day for 35.0 years (35.0 ttl pk-yrs)   Types: Cigarettes   Start date: 57   Quit date: 48   Years since quitting: 39.6  Smokeless Tobacco Never   Counseling given: Not Answered   Outpatient Medications Prior to Visit  Medication Sig Dispense Refill   albuterol (PROVENTIL) (2.5 MG/3ML) 0.083% nebulizer solution Take 3 mLs (2.5 mg total) by nebulization every 6 (six) hours as needed for wheezing or shortness of breath. 3 mL 0   Apoaequorin (PREVAGEN) 10 MG CAPS Take 1 capsule by mouth daily.      Ascorbic Acid (VITAMIN C) 1000 MG tablet Take 1,000 mg by mouth daily.     Cholecalciferol (VITAMIN D3) 125 MCG (  5000 UT) CAPS Take by mouth.     clotrimazole-betamethasone (LOTRISONE) cream Apply 1 Application topically 2 (two) times daily as needed.     cyclobenzaprine (FLEXERIL) 10 MG tablet TAKE 1 TABLET BY MOUTH TWICE  DAILY AS NEEDED FOR MUSCLE  SPASM(S) 60 tablet 3   escitalopram (LEXAPRO) 20 MG tablet Take 1 tablet (20 mg total) by mouth daily. 90 tablet 1   finasteride (PROSCAR) 5 MG tablet TAKE 1 TABLET EVERY DAY 90 tablet 3   fluticasone (FLONASE) 50  MCG/ACT nasal spray USE 1 SPRAY IN BOTH  NOSTRILS DAILY 32 g 3   melatonin 5 MG TABS Take 5 mg by mouth at bedtime.      Methylsulfonylmethane (MSM) 1000 MG CAPS Take 1 capsule by mouth 2 (two) times daily.     Multiple Vitamins-Minerals (MULTIVITAMIN MEN 50+) TABS Take 1 tablet by mouth daily.     nabumetone (RELAFEN) 750 MG tablet TAKE 1 TABLET TWICE DAILY AS NEEDED 180 tablet 3   OVER THE COUNTER MEDICATION Take 1 tablet by mouth daily. Boosted Pro Dietary Supplement for ED (gummy formulation)     pantoprazole (PROTONIX) 40 MG tablet TAKE 1 TABLET ONE TIME DAILY 90 tablet 1   selenium 200 MCG TABS tablet Take by mouth daily.     simvastatin (ZOCOR) 40 MG tablet TAKE 1 TABLET BY MOUTH  DAILY 90 tablet 3   tamsulosin (FLOMAX) 0.4 MG CAPS capsule TAKE 1 CAPSULE EVERY DAY 90 capsule 3   temazepam (RESTORIL) 15 MG capsule TAKE 1 CAPSULE BY MOUTH AT  BEDTIME AS NEEDED FOR SLEEP 90 capsule 1   Testosterone 2 MG/24HR PT24 Place 1 patch onto the skin as needed.     Tiotropium Bromide Monohydrate (SPIRIVA RESPIMAT) 2.5 MCG/ACT AERS Inhale 2 puffs into the lungs daily. 12 g 3   triamcinolone cream (KENALOG) 0.1 % APPLY  CREAM EXTERNALLY TO AFFECTED AREA TWICE DAILY 80 g 1   vitamin B-12 (CYANOCOBALAMIN) 500 MCG tablet Take 500 mcg by mouth daily.     No facility-administered medications prior to visit.     Review of Systems:   Constitutional:   No  weight loss, night sweats,  Fevers, chills, fatigue, or  lassitude.  HEENT:   No headaches,  Difficulty swallowing,  Tooth/dental problems, or  Sore throat,                No sneezing, itching, ear ache, nasal congestion, post nasal drip,   CV:  No chest pain,  Orthopnea, PND, swelling in lower extremities, anasarca, dizziness, palpitations, syncope.   GI  No heartburn, indigestion, abdominal pain, nausea, vomiting, diarrhea, change in bowel habits, loss of appetite, bloody stools.   Resp: No shortness of breath with exertion or at rest.  No excess  mucus, no productive cough,  No non-productive cough,  No coughing up of blood.  No change in color of mucus.  No wheezing.  No chest wall deformity  Skin: no rash or lesions.  GU: no dysuria, change in color of urine, no urgency or frequency.  No flank pain, no hematuria   MS:  No joint pain or swelling.  No decreased range of motion.  No back pain.    Physical Exam  BP 124/68 (BP Location: Right Arm, Cuff Size: Normal)   Pulse 67   Temp 97.9 F (36.6 C) (Oral)   Ht 6\' 1"  (1.854 m)   Wt 192 lb 6.4 oz (87.3 kg)   SpO2 97%   BMI 25.38  kg/m   GEN: A/Ox3; pleasant , NAD, well nourished    HEENT:  [place]/AT,  NOSE-clear, THROAT-clear, no lesions, no postnasal drip or exudate noted.   NECK:  Supple w/ fair ROM; no JVD; normal carotid impulses w/o bruits; no thyromegaly or nodules palpated; no lymphadenopathy.    RESP  Clear  P & A; w/o, wheezes/ rales/ or rhonchi. no accessory muscle use, no dullness to percussion  CARD:  RRR, no m/r/g, no peripheral edema, pulses intact, no cyanosis or clubbing.  GI:   Soft & nt; nml bowel sounds; no organomegaly or masses detected.   Musco: Warm bil, no deformities or joint swelling noted.   Neuro: alert, no focal deficits noted.    Skin: Warm, no lesions or rashes    Lab Results:   BNP No results found for: "BNP"  ProBNP No results found for: "PROBNP"  Imaging: No results found.  Administration History     None           No data to display          No results found for: "NITRICOXIDE"      Assessment & Plan:   Chronic obstructive pulmonary disease (HCC) Compensated on present regimen   Plan  Patient Instructions  Spiriva inhaler 2 puffs daily  Albuterol inhaler or neb As needed   Allegra daily  As needed   Mucinex DM Twice daily as needed.  Tessalon Three times a day  As needed  cough.  Activity as tolerated.  Refer to cardiology  Follow up with 6 months in [place] with Mayanna Garlitz NP or Dr. Belia Heman and As  needed   Please contact office for sooner follow up if symptoms do not improve or worsen or seek emergency care     Moderate obstructive sleep apnea CPAP intolerant.    Postural dizziness with presyncope Episode of lightheadedness - resolved . No further episodes . Denies chest pain . History of irregular heartbeat . Request establish with Cardiology , referral made. Advised if symptoms return to go to urgent care or ER.      Rubye Oaks, NP 03/16/2023

## 2023-03-16 NOTE — Patient Instructions (Addendum)
Spiriva inhaler 2 puffs daily  Albuterol inhaler or neb As needed   Allegra daily  As needed   Mucinex DM Twice daily as needed.  Tessalon Three times a day  As needed  cough.  Activity as tolerated.  Refer to cardiology  Follow up with 6 months in [place] with Julianne Chamberlin NP or Dr. Belia Heman and As needed   Please contact office for sooner follow up if symptoms do not improve or worsen or seek emergency care

## 2023-03-22 ENCOUNTER — Ambulatory Visit: Payer: Medicare HMO | Attending: Cardiology | Admitting: Cardiology

## 2023-03-22 ENCOUNTER — Ambulatory Visit: Payer: Medicare HMO

## 2023-03-22 ENCOUNTER — Encounter: Payer: Self-pay | Admitting: Cardiology

## 2023-03-22 VITALS — BP 156/92 | HR 65 | Ht 73.0 in | Wt 193.0 lb

## 2023-03-22 DIAGNOSIS — I351 Nonrheumatic aortic (valve) insufficiency: Secondary | ICD-10-CM | POA: Diagnosis not present

## 2023-03-22 DIAGNOSIS — E782 Mixed hyperlipidemia: Secondary | ICD-10-CM | POA: Diagnosis not present

## 2023-03-22 DIAGNOSIS — R55 Syncope and collapse: Secondary | ICD-10-CM | POA: Diagnosis not present

## 2023-03-22 DIAGNOSIS — R030 Elevated blood-pressure reading, without diagnosis of hypertension: Secondary | ICD-10-CM

## 2023-03-22 NOTE — Progress Notes (Signed)
Cardiology Office Note:    Date:  03/22/2023   ID:  Joshua Mercado, DOB 10/30/1945, MRN [id number]  PCP:  Sharon Seller, NP   [facility] Providers Cardiologist:  Debbe Odea, MD     Referring MD: Julio Sicks, NP   Chief Complaint  Patient presents with   Follow-up    Referred for cardiac evaluation of Pre-syncope and was last seen by Dr. Azucena Cecil in 03/2020.  Patient reports a couple weeks ago he had an episode of feeling lightheaded during the night.  He was able to get himself to the floor and did not pass out.  No chest pain or SOBr and no further episodes.    History of Present Illness:    Joshua Mercado is a 77 y.o. male with a hx of hyperlipidemia, former smoker x 35+ years, COPD who presents due to lightheadedness.  Patient woke up about 2 to 3 weeks ago, felt dizzy going to the bathroom.  Dizziness persisted for several minutes with patient feeling like the room was spinning.  He placed his arm against the sink, felt like he was going to fall, decided to go down on his knees in order to prevent falling.  Felt better a few minutes later, did not want to spend the entire evening in the ED as such did not go to the emergency room.  He denies chest pain, shortness of breath, palpitations.  Has not had any episodes since.  Feels okay otherwise.  Last seen in 2021 with symptoms of irregular heartbeat. 14-day cardiac monitor was unrevealing, occasional PVCs noted. Echocardiogram 08/2017 EF 60%, mild AI.  Past Medical History:  Diagnosis Date   Arthritis    Knees   COPD (chronic obstructive pulmonary disease) (HCC)    Does use hearing aid    Bilateral   Enlarged prostate    GERD (gastroesophageal reflux disease)    Hypercholesterolemia     Past Surgical History:  Procedure Laterality Date   CHEEK AUGMENTATION Left 1970   REstructure of cheek bone    COLONOSCOPY  2017   Dr. Alphonsa Overall   COLONOSCOPY WITH PROPOFOL N/A 05/05/2022   Procedure: COLONOSCOPY WITH  PROPOFOL;  Surgeon: Midge Minium, MD;  Location: [facility] ENDOSCOPY;  Service: Endoscopy;  Laterality: N/A;   ESOPHAGOGASTRODUODENOSCOPY (EGD) WITH PROPOFOL N/A 12/05/2020   Procedure: ESOPHAGOGASTRODUODENOSCOPY (EGD) WITH PROPOFOL;  Surgeon: Midge Minium, MD;  Location: [facility] ENDOSCOPY;  Service: Endoscopy;  Laterality: N/A;   EYE SURGERY Bilateral 2005   NASAL SEPTOPLASTY W/ TURBINOPLASTY Bilateral 12/24/2020   Procedure: NASAL SEPTOPLASTY WITH INFERIOR TURBINATE REDUCTION;  Surgeon: Bud Face, MD;  Location: [facility] ORS;  Service: ENT;  Laterality: Bilateral;   UPPER GI ENDOSCOPY      Current Medications: Current Meds  Medication Sig   albuterol (PROVENTIL) (2.5 MG/3ML) 0.083% nebulizer solution Take 3 mLs (2.5 mg total) by nebulization every 6 (six) hours as needed for wheezing or shortness of breath.   Apoaequorin (PREVAGEN) 10 MG CAPS Take 1 capsule by mouth daily.    Ascorbic Acid (VITAMIN C) 1000 MG tablet Take 1,000 mg by mouth daily.   Cholecalciferol (VITAMIN D3) 125 MCG (5000 UT) CAPS Take by mouth.   clotrimazole-betamethasone (LOTRISONE) cream Apply 1 Application topically 2 (two) times daily as needed.   cyclobenzaprine (FLEXERIL) 10 MG tablet TAKE 1 TABLET BY MOUTH TWICE  DAILY AS NEEDED FOR MUSCLE  SPASM(S)   escitalopram (LEXAPRO) 20 MG tablet Take 1 tablet (20 mg total) by mouth daily.   finasteride (  PROSCAR) 5 MG tablet TAKE 1 TABLET EVERY DAY   fluticasone (FLONASE) 50 MCG/ACT nasal spray USE 1 SPRAY IN BOTH  NOSTRILS DAILY   melatonin 5 MG TABS Take 5 mg by mouth at bedtime.    Methylsulfonylmethane (MSM) 1000 MG CAPS Take 1 capsule by mouth 2 (two) times daily.   Multiple Vitamins-Minerals (MULTIVITAMIN MEN 50+) TABS Take 1 tablet by mouth daily.   nabumetone (RELAFEN) 750 MG tablet TAKE 1 TABLET TWICE DAILY AS NEEDED   OVER THE COUNTER MEDICATION Take 1 tablet by mouth daily. Boosted Pro Dietary Supplement for ED (gummy formulation)   pantoprazole (PROTONIX) 40 MG  tablet TAKE 1 TABLET ONE TIME DAILY   selenium 200 MCG TABS tablet Take by mouth daily.   simvastatin (ZOCOR) 40 MG tablet TAKE 1 TABLET BY MOUTH  DAILY   tamsulosin (FLOMAX) 0.4 MG CAPS capsule TAKE 1 CAPSULE EVERY DAY   temazepam (RESTORIL) 15 MG capsule TAKE 1 CAPSULE BY MOUTH AT  BEDTIME AS NEEDED FOR SLEEP   Testosterone 2 MG/24HR PT24 Place 1 patch onto the skin as needed.   Tiotropium Bromide Monohydrate (SPIRIVA RESPIMAT) 2.5 MCG/ACT AERS Inhale 2 puffs into the lungs daily.   triamcinolone cream (KENALOG) 0.1 % APPLY  CREAM EXTERNALLY TO AFFECTED AREA TWICE DAILY   vitamin B-12 (CYANOCOBALAMIN) 500 MCG tablet Take 500 mcg by mouth daily.     Allergies:   Bee pollen and Bee venom   Social History   Socioeconomic History   Marital status: Married    Spouse name: Not on file   Number of children: Not on file   Years of education: Not on file   Highest education level: Not on file  Occupational History   Not on file  Tobacco Use   Smoking status: Former    Current packs/day: 0.00    Average packs/day: 1 pack/day for 35.0 years (35.0 ttl pk-yrs)    Types: Cigarettes    Start date: 39    Quit date: 53    Years since quitting: 39.7   Smokeless tobacco: Never  Vaping Use   Vaping status: Never Used  Substance and Sexual Activity   Alcohol use: Not Currently   Drug use: Never   Sexual activity: Not on file  Other Topics Concern   Not on file  Social History Narrative   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  Wife is a retired Development worker, community.    Social Determinants of Health   Financial Resource Strain: Not on file  Food Insecurity: Not on file  Transportation Needs: Not on file  Physical Activity: Not on file  Stress: Not on file  Social Connections: Unknown (04/21/2022)   Received from [employer] [facility]   Family and [employer] Support    Help with Day-to-Day Activities: Not on file    Lonely or Isolated: Not on file      Family History: The patient's family history includes Alzheimer's disease in his mother; Dementia in his father; Diabetes in his father and mother; Heart attack in his father; Heart failure in his father.  ROS:   Please see the history of present illness.     All other systems reviewed and are negative.  EKGs/Labs/Other Studies Reviewed:    The following studies were reviewed today:  EKG Interpretation Date/Time:  Monday March 22 2023 14:16:51 EDT Ventricular Rate:  65 PR Interval:  164 QRS Duration:  94 QT Interval:  430 QTC Calculation: 447 R Axis:  38  Text Interpretation: Sinus rhythm with Premature atrial complexes No previous ECGs available Confirmed by Debbe Odea ([id number]) on 03/22/2023 2:30:42 PM    Recent Labs: 08/27/2022: ALT 20; BUN 20; Creatinine 0.8; Hemoglobin 16.2; Platelets 130; Potassium 4.0; Sodium 138  Recent Lipid Panel    Component Value Date/Time   CHOL 189 08/27/2022 0000   TRIG 199 (A) 08/27/2022 0000   HDL 37 08/27/2022 0000   LDLCALC 120 08/27/2022 0000     Risk Assessment/Calculations:    HYPERTENSION CONTROL Vitals:   03/22/23 1411 03/22/23 1414 03/22/23 1419  BP: (!) 154/92 (!) 158/90 (!) 156/92    The patient's blood pressure is elevated above target today.  In order to address the patient's elevated BP: Blood pressure will be monitored at home to determine if medication changes need to be made.            Physical Exam:    VS:  BP (!) 156/92 (BP Location: Left Arm, Patient Position: Sitting, Cuff Size: Normal)   Pulse 65   Ht 6\' 1"  (1.854 m)   Wt 193 lb (87.5 kg)   SpO2 98%   BMI 25.46 kg/m     Wt Readings from Last 3 Encounters:  03/22/23 193 lb (87.5 kg)  03/16/23 192 lb 6.4 oz (87.3 kg)  03/12/23 191 lb (86.6 kg)     GEN:  Well nourished, well developed in no acute distress HEENT: Normal NECK: No JVD; No carotid bruits CARDIAC: RRR, no murmurs, rubs, gallops RESPIRATORY:  Clear to auscultation without  rales, wheezing or rhonchi  ABDOMEN: Soft, non-tender, non-distended MUSCULOSKELETAL:  No edema; No deformity  SKIN: Warm and dry NEUROLOGIC:  Alert and oriented x 3 PSYCHIATRIC:  Normal affect   ASSESSMENT:    1. Pre-syncope   2. Elevated BP without diagnosis of hypertension   3. Mixed hyperlipidemia   4. Aortic valve insufficiency, etiology of cardiac valve disease unspecified    PLAN:    In order of problems listed above:  Presyncope, dizziness.  Orthostatic showed some orthostasis with systolics going from 170s to 150s moving from sitting to standing.  Place cardiac monitor to evaluate any significant arrhythmias.  Current systolic around 150.  Will avoid starting antihypertensive, let BP run a little high due to patient's age and symptoms of dizziness. Hyperlipidemia, continue simvastatin. History of mild AI, repeat echo to evaluate any worsening valvular disease.  Follow-up after echo and cardiac monitor.      Medication Adjustments/Labs and Tests Ordered: Current medicines are reviewed at length with the patient today.  Concerns regarding medicines are outlined above.  Orders Placed This Encounter  Procedures   LONG TERM MONITOR (3-14 DAYS)   EKG 12-Lead   ECHOCARDIOGRAM COMPLETE   No orders of the defined types were placed in this encounter.   Patient Instructions  Medication Instructions:   Your physician recommends that you continue on your current medications as directed. Please refer to the Current Medication list given to you today.  *If you need a refill on your cardiac medications before your next appointment, please call your pharmacy*   Lab Work:  None Ordered  If you have labs (blood work) drawn today and your tests are completely normal, you will receive your results only by: MyChart Message (if you have MyChart) OR A paper copy in the mail If you have any lab test that is abnormal or we need to change your treatment, we will call you to review  the results.  Testing/Procedures:  Your physician has requested that you have an echocardiogram. Echocardiography is a painless test that uses sound waves to create images of your heart. It provides your doctor with information about the size and shape of your heart and how well your heart's chambers and valves are working. This procedure takes approximately one hour. There are no restrictions for this procedure. Please do NOT wear cologne, perfume, aftershave, or lotions (deodorant is allowed). Please arrive 15 minutes prior to your appointment time.  Your physician has recommended that you wear a Zio monitor.   This monitor is a medical device that records the heart's electrical activity. Doctors most often use these monitors to diagnose arrhythmias. Arrhythmias are problems with the speed or rhythm of the heartbeat. The monitor is a small device applied to your chest. You can wear one while you do your normal daily activities. While wearing this monitor if you have any symptoms to push the button and record what you felt. Once you have worn this monitor for the period of time provider prescribed (Usually 14 days), you will return the monitor device in the postage paid box. Once it is returned they will download the data collected and provide [place] with a report which the provider will then review and we will call you with those results. Important tips:  Avoid showering during the first 24 hours of wearing the monitor. Avoid excessive sweating to help maximize wear time. Do not submerge the device, no hot tubs, and no swimming pools. Keep any lotions or oils away from the patch. After 24 hours you may shower with the patch on. Take brief showers with your back facing the shower head.  Do not remove patch once it has been placed because that will interrupt data and decrease adhesive wear time. Push the button when you have any symptoms and write down what you were feeling. Once you have completed  wearing your monitor, remove and place into box which has postage paid and place in your outgoing mailbox.  If for some reason you have misplaced your box then call our office and we can provide another box and/or mail it off for you.     Follow-Up: At [facility], you and your health needs are our priority.  As part of our continuing mission to provide you with exceptional heart care, we have created designated Provider Care Teams.  These Care Teams include your primary Cardiologist (physician) and Advanced Practice Providers (APPs -  Physician Assistants and Nurse Practitioners) who all work together to provide you with the care you need, when you need it.  We recommend signing up for the patient portal called "MyChart".  Sign up information is provided on this After Visit Summary.  MyChart is used to connect with patients for Virtual Visits (Telemedicine).  Patients are able to view lab/test results, encounter notes, upcoming appointments, etc.  Non-urgent messages can be sent to your provider as well.   To learn more about what you can do with MyChart, go to [email or web address].    Your next appointment:    After Cardiac Testing  Provider:   You may see Debbe Odea, MD or one of the following Advanced Practice Providers on your designated Care Team:   Nicolasa Ducking, NP Eula Listen, PA-C Cadence Fransico Michael, PA-C Charlsie Quest, NP   Signed, Debbe Odea, MD  03/22/2023 3:05 PM    [facility]

## 2023-03-22 NOTE — Patient Instructions (Signed)
Medication Instructions:   Your physician recommends that you continue on your current medications as directed. Please refer to the Current Medication list given to you today.  *If you need a refill on your cardiac medications before your next appointment, please call your pharmacy*   Lab Work:  None Ordered  If you have labs (blood work) drawn today and your tests are completely normal, you will receive your results only by: MyChart Message (if you have MyChart) OR A paper copy in the mail If you have any lab test that is abnormal or we need to change your treatment, we will call you to review the results.   Testing/Procedures:  Your physician has requested that you have an echocardiogram. Echocardiography is a painless test that uses sound waves to create images of your heart. It provides your doctor with information about the size and shape of your heart and how well your heart's chambers and valves are working. This procedure takes approximately one hour. There are no restrictions for this procedure. Please do NOT wear cologne, perfume, aftershave, or lotions (deodorant is allowed). Please arrive 15 minutes prior to your appointment time.  Your physician has recommended that you wear a Zio monitor.   This monitor is a medical device that records the heart's electrical activity. Doctors most often use these monitors to diagnose arrhythmias. Arrhythmias are problems with the speed or rhythm of the heartbeat. The monitor is a small device applied to your chest. You can wear one while you do your normal daily activities. While wearing this monitor if you have any symptoms to push the button and record what you felt. Once you have worn this monitor for the period of time provider prescribed (Usually 14 days), you will return the monitor device in the postage paid box. Once it is returned they will download the data collected and provide [place] with a report which the provider will then review and  we will call you with those results. Important tips:  Avoid showering during the first 24 hours of wearing the monitor. Avoid excessive sweating to help maximize wear time. Do not submerge the device, no hot tubs, and no swimming pools. Keep any lotions or oils away from the patch. After 24 hours you may shower with the patch on. Take brief showers with your back facing the shower head.  Do not remove patch once it has been placed because that will interrupt data and decrease adhesive wear time. Push the button when you have any symptoms and write down what you were feeling. Once you have completed wearing your monitor, remove and place into box which has postage paid and place in your outgoing mailbox.  If for some reason you have misplaced your box then call our office and we can provide another box and/or mail it off for you.     Follow-Up: At [facility], you and your health needs are our priority.  As part of our continuing mission to provide you with exceptional heart care, we have created designated Provider Care Teams.  These Care Teams include your primary Cardiologist (physician) and Advanced Practice Providers (APPs -  Physician Assistants and Nurse Practitioners) who all work together to provide you with the care you need, when you need it.  We recommend signing up for the patient portal called "MyChart".  Sign up information is provided on this After Visit Summary.  MyChart is used to connect with patients for Virtual Visits (Telemedicine).  Patients are able to view  lab/test results, encounter notes, upcoming appointments, etc.  Non-urgent messages can be sent to your provider as well.   To learn more about what you can do with MyChart, go to [email or web address].    Your next appointment:    After Cardiac Testing  Provider:   You may see Debbe Odea, MD or one of the following Advanced Practice Providers on your designated Care Team:   Nicolasa Ducking,  NP Eula Listen, PA-C Cadence Fransico Michael, PA-C Charlsie Quest, NP

## 2023-04-16 DIAGNOSIS — R55 Syncope and collapse: Secondary | ICD-10-CM | POA: Diagnosis not present

## 2023-04-21 DIAGNOSIS — M170 Bilateral primary osteoarthritis of knee: Secondary | ICD-10-CM | POA: Diagnosis not present

## 2023-04-28 ENCOUNTER — Other Ambulatory Visit: Payer: Self-pay | Admitting: Nurse Practitioner

## 2023-04-28 DIAGNOSIS — F5101 Primary insomnia: Secondary | ICD-10-CM

## 2023-04-28 DIAGNOSIS — M170 Bilateral primary osteoarthritis of knee: Secondary | ICD-10-CM | POA: Diagnosis not present

## 2023-04-29 NOTE — Telephone Encounter (Signed)
Patient has request refill on medication Temazepam 15mg . Patient medication last refilled 07/27/2022. Patient has Non opioid contract on file dated 08/21/2022. Patient medication pend and sent to PCP Janyth Contes Janene Harvey, NP

## 2023-05-05 DIAGNOSIS — M170 Bilateral primary osteoarthritis of knee: Secondary | ICD-10-CM | POA: Diagnosis not present

## 2023-05-06 ENCOUNTER — Other Ambulatory Visit: Payer: Medicare HMO

## 2023-05-06 DIAGNOSIS — R55 Syncope and collapse: Secondary | ICD-10-CM | POA: Diagnosis not present

## 2023-05-06 DIAGNOSIS — R5383 Other fatigue: Secondary | ICD-10-CM | POA: Diagnosis not present

## 2023-05-06 DIAGNOSIS — Z20822 Contact with and (suspected) exposure to covid-19: Secondary | ICD-10-CM | POA: Diagnosis not present

## 2023-05-11 ENCOUNTER — Other Ambulatory Visit: Payer: Medicare HMO

## 2023-05-19 ENCOUNTER — Other Ambulatory Visit: Payer: Medicare HMO

## 2023-05-20 ENCOUNTER — Other Ambulatory Visit: Payer: Self-pay | Admitting: Nurse Practitioner

## 2023-05-20 ENCOUNTER — Ambulatory Visit: Payer: Medicare HMO | Admitting: Cardiology

## 2023-05-20 DIAGNOSIS — J302 Other seasonal allergic rhinitis: Secondary | ICD-10-CM

## 2023-05-28 ENCOUNTER — Ambulatory Visit: Payer: Medicare HMO | Attending: Cardiology

## 2023-05-28 DIAGNOSIS — R55 Syncope and collapse: Secondary | ICD-10-CM | POA: Diagnosis not present

## 2023-05-28 LAB — ECHOCARDIOGRAM COMPLETE
AR max vel: 3.62 cm2
AV Area VTI: 3.32 cm2
AV Area mean vel: 2.99 cm2
AV Mean grad: 4.5 mm[Hg]
AV Peak grad: 7.8 mm[Hg]
Ao pk vel: 1.4 m/s
Area-P 1/2: 4.33 cm2
Calc EF: 50.9 %
S' Lateral: 3.8 cm
Single Plane A2C EF: 50.4 %
Single Plane A4C EF: 55 %

## 2023-05-31 ENCOUNTER — Ambulatory Visit: Payer: Medicare HMO | Attending: Cardiology | Admitting: Cardiology

## 2023-05-31 ENCOUNTER — Encounter: Payer: Self-pay | Admitting: Cardiology

## 2023-05-31 VITALS — BP 145/72 | HR 66 | Ht 73.0 in | Wt 196.2 lb

## 2023-05-31 DIAGNOSIS — E782 Mixed hyperlipidemia: Secondary | ICD-10-CM | POA: Diagnosis not present

## 2023-05-31 DIAGNOSIS — I351 Nonrheumatic aortic (valve) insufficiency: Secondary | ICD-10-CM | POA: Diagnosis not present

## 2023-05-31 DIAGNOSIS — R55 Syncope and collapse: Secondary | ICD-10-CM

## 2023-05-31 DIAGNOSIS — I10 Essential (primary) hypertension: Secondary | ICD-10-CM

## 2023-05-31 MED ORDER — METOPROLOL SUCCINATE ER 25 MG PO TB24: 12.5000 mg | ORAL_TABLET | Freq: Every day | ORAL | 3 refills | Status: DC

## 2023-05-31 NOTE — Progress Notes (Signed)
Cardiology Office Note:  .   Date:  05/31/2023  ID:  Joshua Mercado, DOB 12-Dec-1945, MRN [id number] PCP: Sharon Seller, NP  [facility] Providers Cardiologist:  Debbe Odea, MD    History of Present Illness: .   Joshua Mercado is a 77 y.o. male with past medical history of hyperlipidemia, former smoker x 25+ years, COPD, hypertension, obstructive sleep apnea, and presyncope, who is here today for follow-up.  He was last seen in clinic in 2021 for irregular heartbeat. He wore an event monitor that showed occasional PVC's. Echocardiogram completed showed an EF of 60% and mild AI.   Patient was last seen in clinic on 03/22/2023 by Dr.Agbor-Etang for lightheadedness. He stated that he had woken up 2-3 weeks prior and felt dizzy going tot he bathroom.  He stated his dizziness persisted for several minutes at home with room spinning.  He places all medications for syncope so he was going to fall.  He is not admitted as needed in order to prevent falling.  He felt better few minutes later did not present to the emergency department.  Orthostatic vital signs revealed some orthostasis with systolic drop from 170s to 150s moving from sitting to standing.  He was placed on a cardiac monitor but no antihypertensive medications were started at that time.  He was also scheduled for repeat echocardiogram.  Monitor revealed occasional paroxysmal SVT, 1 episode of nonsustained V. tach lasting 5 beats, frequent PACs with an 18% burden, and occasional PVCs.  Overall no significant sustained arrhythmias to suggest etiology of dizziness.  Consider starting low-dose beta-blocker for ectopy suppression.  Echocardiogram reveals LVEF of 50-55%, no RWMA, mild LVH, mild aortic regurgitation, and mild dilatation of the aortic root measuring 44 mm.  He returns to clinic today stating that from the cardiac perspective he has been doing well. He denies any chest pain, shortness of breath, palpitations, dizziness or  lightheadedness, or recurrent near syncopal episodes. He states that he has been compliant with his current medication regimen.  Recently had a ZIO monitor placed and echocardiogram completed.  Continues to reside at [facility].  Denies any hospitalizations or visits to the emergency department.  ROS: 10 point review of systems is reviewed and considered negative with exception what is listed in the HPI  Studies Reviewed: Marland Kitchen   EKG Interpretation Date/Time:  Monday May 31 2023 13:26:16 EST Ventricular Rate:  66 PR Interval:    QRS Duration:  94 QT Interval:  430 QTC Calculation: 450 R Axis:   32  Text Interpretation: Sinus rhythm Premature atrial complexes and blocked PAC When compared with ECG of 22-Mar-2023 14:16, Confirmed by Charlsie Quest ([id number]) on 05/31/2023 1:34:59 PM    TTE 05/28/23 1. Left ventricular ejection fraction, by estimation, is 50 to 55%. Left  ventricular ejection fraction by 2D MOD biplane is 50.9 %. The left  ventricle has low normal function. The left ventricle has no regional wall  motion abnormalities. There is mild  left ventricular hypertrophy. Left ventricular diastolic parameters are  consistent with Grade I diastolic dysfunction (impaired relaxation).   2. Right ventricular systolic function is normal. The right ventricular  size is normal.   3. The mitral valve is normal in structure. No evidence of mitral valve  regurgitation.   4. The aortic valve is tricuspid. Aortic valve regurgitation is mild.   5. Aortic dilatation noted. There is mild dilatation of the aortic root,  measuring 40 mm.   6. The inferior vena cava  is normal in size with <50% respiratory  variability, suggesting right atrial pressure of 8 mmHg.   Event monitor 05/10/23 Conclusion Occasional paroxysmal SVT. 1 episode of nonsustained VT lasting 5 beats. Frequent PACs, 18% burden, occasional PVCs. Overall no significant sustained arrhythmias to suggest etiology of  dizziness. Will Consider starting low-dose beta-blocker for ectopy suppression.    Risk Assessment/Calculations:        Physical Exam:   VS:  BP (!) 145/72 (BP Location: Left Arm, Patient Position: Sitting, Cuff Size: Normal)   Pulse 66   Ht 6\' 1"  (1.854 m)   Wt 196 lb 3.2 oz (89 kg)   SpO2 96%   BMI 25.89 kg/m    Wt Readings from Last 3 Encounters:  05/31/23 196 lb 3.2 oz (89 kg)  03/22/23 193 lb (87.5 kg)  03/16/23 192 lb 6.4 oz (87.3 kg)    GEN: Well nourished, well developed in no acute distress NECK: No JVD; No carotid bruits CARDIAC: RRR, no murmurs, rubs, gallops RESPIRATORY:  Clear to auscultation without rales, wheezing or rhonchi  ABDOMEN: Soft, non-tender, non-distended EXTREMITIES:  No edema; No deformity   ASSESSMENT AND PLAN: .   Presyncope with dizziness without recurrence.  Event monitor revealed occasional PSVT, 1 episode of 9 sustained V. tach lasting 5 beats frequent PACs with 18% burden and occasional PVCs.  He has been started on low-dose beta-blocker therapy today Toprol-XL 12.5 mg at bedtime to try to suppress the ectopy.  EKG today rate 50 Tylenol and sinus rhythm with frequent PACs and blocked PACs with a rate of 66.  Hyperlipidemia.  Has been continued on simvastatin.  History of mild AR recent echocardiogram revealed an LVEF of 50 demonstrated 5%, no RWMA, G1 DD, and mild aortic regurgitation.  No significant change from prior study.  Hypertension with blood pressure today 145/72.  Patient previously had some orthostasis which seems to have resolved.  Will starting him on Toprol-XL 12.5 mg daily will assist with his elevated blood pressure.  He has been encouraged to monitor his pressure 1 to 2 hours postmedication administration as well.       Dispo: Patient to return to clinic to see MD/APP in 6 weeks or sooner if needed for reevaluation of symptoms  Signed, Simi Briel, NP

## 2023-05-31 NOTE — Patient Instructions (Signed)
Medication Instructions:  Your physician has recommended you make the following change in your medication:  START: metoprolol succinate (TOPROL XL) 25 MG 24 hr tablet - Take 0.5 tablets (12.5 mg total) by mouth daily  *If you need a refill on your cardiac medications before your next appointment, please call your pharmacy*  Lab Work: - None ordered  Testing/Procedures: - None ordered  Follow-Up: At [facility], you and your health needs are our priority.  As part of our continuing mission to provide you with exceptional heart care, we have created designated Provider Care Teams.  These Care Teams include your primary Cardiologist (physician) and Advanced Practice Providers (APPs -  Physician Assistants and Nurse Practitioners) who all work together to provide you with the care you need, when you need it.  Your next appointment:   6 week(s)  Provider:   Charlsie Quest, NP    Other Instructions - None

## 2023-06-15 ENCOUNTER — Ambulatory Visit: Payer: Medicare HMO | Admitting: Nurse Practitioner

## 2023-06-15 ENCOUNTER — Encounter: Payer: Self-pay | Admitting: Nurse Practitioner

## 2023-06-15 VITALS — BP 144/92 | HR 67 | Temp 97.8°F | Ht 73.0 in | Wt 194.0 lb

## 2023-06-15 DIAGNOSIS — I10 Essential (primary) hypertension: Secondary | ICD-10-CM | POA: Diagnosis not present

## 2023-06-15 DIAGNOSIS — R519 Headache, unspecified: Secondary | ICD-10-CM

## 2023-06-15 MED ORDER — METOPROLOL SUCCINATE ER 25 MG PO TB24
25.0000 mg | ORAL_TABLET | Freq: Every day | ORAL | Status: DC
Start: 2023-06-15 — End: 2023-07-01

## 2023-06-15 NOTE — Patient Instructions (Addendum)
To follow up with ophthalmologist due to the changes in vision you experiences.   Increase metoprolol to 25 mg daily - 1 whole tablet.   To check blood pressure 1 hour AFTER you have had your medication Make sure you have been sitting at least 5 mins  Record and bring to cardiologist appt.  Goal <140/90

## 2023-06-15 NOTE — Progress Notes (Signed)
Careteam: Patient Care Team: Sharon Seller, NP as PCP - General (Geriatric Medicine) Debbe Odea, MD as PCP - Cardiology (Cardiology)  Advanced Directive information    Allergies  Allergen Reactions   Bee Pollen Anaphylaxis    Bee Venom   Bee Venom     Throat/mouth swelling    Chief Complaint  Patient presents with   Acute Visit    Haedaches      HPI: Patient is a 77 y.o. male seen in today at the [facility] for headaches. Reports he had a blind spot in his right side of his eye- has improved, comes and goes and has now went away.  He had a headache over his right eye. No current headache. Had one episode which was 1 week ago, Last 2 days.  He went to see cardiologist and recommended metoprolol, started taking and got headache so thought this was associated. He restarted back and has not had any additional headaches. Has follow up this week.  He has cut out salt and bp still elevated but unsure of actual readings.  Reports he does not want to take blood pressure medication.  He takes medication in the evening- took last night.  Does not have any palpitations that he is aware of.  Reports he has felt like his normal self for the last week.   Review of Systems:  Review of Systems  Constitutional:  Negative for chills, fever and weight loss.  HENT:  Negative for tinnitus.   Eyes:  Negative for blurred vision, double vision, pain, discharge and redness.  Respiratory:  Negative for cough, sputum production and shortness of breath.   Cardiovascular:  Negative for chest pain, palpitations and leg swelling.  Gastrointestinal:  Negative for abdominal pain, constipation, diarrhea and heartburn.  Genitourinary:  Negative for dysuria, frequency and urgency.  Musculoskeletal:  Negative for back pain, joint pain and myalgias.  Skin: Negative.   Neurological:  Negative for dizziness and headaches.    Past Medical History:  Diagnosis Date   Arthritis     Knees   COPD (chronic obstructive pulmonary disease) (HCC)    Does use hearing aid    Bilateral   Enlarged prostate    GERD (gastroesophageal reflux disease)    Hypercholesterolemia    Past Surgical History:  Procedure Laterality Date   CHEEK AUGMENTATION Left 1970   REstructure of cheek bone    COLONOSCOPY  2017   Dr. Alphonsa Overall   COLONOSCOPY WITH PROPOFOL N/A 05/05/2022   Procedure: COLONOSCOPY WITH PROPOFOL;  Surgeon: Midge Minium, MD;  Location: [facility] ENDOSCOPY;  Service: Endoscopy;  Laterality: N/A;   ESOPHAGOGASTRODUODENOSCOPY (EGD) WITH PROPOFOL N/A 12/05/2020   Procedure: ESOPHAGOGASTRODUODENOSCOPY (EGD) WITH PROPOFOL;  Surgeon: Midge Minium, MD;  Location: [facility] ENDOSCOPY;  Service: Endoscopy;  Laterality: N/A;   EYE SURGERY Bilateral 2005   NASAL SEPTOPLASTY W/ TURBINOPLASTY Bilateral 12/24/2020   Procedure: NASAL SEPTOPLASTY WITH INFERIOR TURBINATE REDUCTION;  Surgeon: Bud Face, MD;  Location: [facility] ORS;  Service: ENT;  Laterality: Bilateral;   UPPER GI ENDOSCOPY     Social History:   reports that he quit smoking about 39 years ago. His smoking use included cigarettes. He started smoking about 74 years ago. He has a 35 pack-year smoking history. He has never used smokeless tobacco. He reports that he does not currently use alcohol. He reports that he does not use drugs.  Family History  Problem Relation Age of Onset   Alzheimer's disease Mother  Diabetes Mother    Heart failure Father    Heart attack Father    Diabetes Father    Dementia Father     Medications: Patient's Medications  New Prescriptions   No medications on file  Previous Medications   ALBUTEROL (PROVENTIL) (2.5 MG/3ML) 0.083% NEBULIZER SOLUTION    Take 3 mLs (2.5 mg total) by nebulization every 6 (six) hours as needed for wheezing or shortness of breath.   APOAEQUORIN (PREVAGEN) 10 MG CAPS    Take 1 capsule by mouth daily.    ASCORBIC ACID (VITAMIN C) 1000 MG TABLET    Take 1,000 mg by mouth daily.    CHOLECALCIFEROL (VITAMIN D3) 125 MCG (5000 UT) CAPS    Take by mouth.   CLOTRIMAZOLE-BETAMETHASONE (LOTRISONE) CREAM    Apply 1 Application topically 2 (two) times daily as needed.   CYCLOBENZAPRINE (FLEXERIL) 10 MG TABLET    TAKE 1 TABLET BY MOUTH TWICE  DAILY AS NEEDED FOR MUSCLE  SPASM(S)   ESCITALOPRAM (LEXAPRO) 20 MG TABLET    Take 1 tablet (20 mg total) by mouth daily.   FINASTERIDE (PROSCAR) 5 MG TABLET    TAKE 1 TABLET EVERY DAY   FLUTICASONE (FLONASE) 50 MCG/ACT NASAL SPRAY    USE 1 SPRAY IN BOTH NOSTRILS DAILY   MELATONIN 5 MG TABS    Take 5 mg by mouth at bedtime.    METHYLSULFONYLMETHANE (MSM) 1000 MG CAPS    Take 1 capsule by mouth 2 (two) times daily.   METOPROLOL SUCCINATE (TOPROL XL) 25 MG 24 HR TABLET    Take 0.5 tablets (12.5 mg total) by mouth daily.   MULTIPLE VITAMINS-MINERALS (MULTIVITAMIN MEN 50+) TABS    Take 1 tablet by mouth daily.   NABUMETONE (RELAFEN) 750 MG TABLET    TAKE 1 TABLET TWICE DAILY AS NEEDED   OVER THE COUNTER MEDICATION    Take 1 tablet by mouth as needed. Boosted Pro Dietary Supplement for ED (gummy formulation)   PANTOPRAZOLE (PROTONIX) 40 MG TABLET    TAKE 1 TABLET ONE TIME DAILY   SELENIUM 200 MCG TABS TABLET    Take by mouth daily.   SIMVASTATIN (ZOCOR) 40 MG TABLET    TAKE 1 TABLET BY MOUTH  DAILY   SIMVASTATIN (ZOCOR) 40 MG TABLET    Take 20 mg by mouth daily.   TAMSULOSIN (FLOMAX) 0.4 MG CAPS CAPSULE    TAKE 1 CAPSULE EVERY DAY   TEMAZEPAM (RESTORIL) 15 MG CAPSULE    TAKE 1 CAPSULE AT BEDTIME AS NEEDED FOR SLEEP   TESTOSTERONE 2 MG/24HR PT24    Place 1 patch onto the skin as needed.   TIOTROPIUM BROMIDE MONOHYDRATE (SPIRIVA RESPIMAT) 2.5 MCG/ACT AERS    Inhale 2 puffs into the lungs daily.   TRIAMCINOLONE CREAM (KENALOG) 0.1 %    APPLY  CREAM EXTERNALLY TO AFFECTED AREA TWICE DAILY   VITAMIN B-12 (CYANOCOBALAMIN) 500 MCG TABLET    Take 500 mcg by mouth daily.  Modified Medications   No medications on file  Discontinued Medications   No  medications on file    Physical Exam:  Vitals:   06/15/23 1014 06/15/23 1017  BP: (!) 138/90 (!) 144/92  Pulse: 67   Temp: 97.8 F (36.6 C)   TempSrc: Temporal   SpO2: 99%   Weight: 194 lb (88 kg)   Height: 6\' 1"  (1.854 m)    Body mass index is 25.6 kg/m. Wt Readings from Last 3 Encounters:  06/15/23 194 lb (88 kg)  05/31/23 196 lb 3.2 oz (89 kg)  03/22/23 193 lb (87.5 kg)    Physical Exam Constitutional:      General: He is not in acute distress.    Appearance: He is well-developed. He is not diaphoretic.  HENT:     Head: Normocephalic and atraumatic.     Right Ear: External ear normal.     Left Ear: External ear normal.     Mouth/Throat:     Pharynx: No oropharyngeal exudate.  Eyes:     Conjunctiva/sclera: Conjunctivae normal.     Pupils: Pupils are equal, round, and reactive to light.  Cardiovascular:     Rate and Rhythm: Normal rate and regular rhythm.     Heart sounds: Normal heart sounds.  Pulmonary:     Effort: Pulmonary effort is normal.     Breath sounds: Normal breath sounds.  Abdominal:     General: Bowel sounds are normal.     Palpations: Abdomen is soft.  Musculoskeletal:        General: No tenderness.     Cervical back: Normal range of motion and neck supple.     Right lower leg: No edema.     Left lower leg: No edema.  Skin:    General: Skin is warm and dry.  Neurological:     Mental Status: He is alert and oriented to person, place, and time.     Labs reviewed: Basic Metabolic Panel: Recent Labs    08/27/22 0000  NA 138  K 4.0  CL 104  CO2 27*  BUN 20  CREATININE 0.8  CALCIUM 9.9   Liver Function Tests: Recent Labs    08/27/22 0000  AST 20  ALT 20  ALKPHOS 65  ALBUMIN 4.5   No results for input(s): "LIPASE", "AMYLASE" in the last 8760 hours. No results for input(s): "AMMONIA" in the last 8760 hours. CBC: Recent Labs    08/27/22 0000  WBC 5.7  NEUTROABS 2,690.00  HGB 16.2  HCT 46  PLT 130*   Lipid  Panel: Recent Labs    08/27/22 0000  CHOL 189  HDL 37  LDLCALC 120  TRIG 784*   TSH: No results for input(s): "TSH" in the last 8760 hours. A1C: No results found for: "HGBA1C"   Assessment/Plan 1. Nonintractable headache, unspecified chronicity pattern, unspecified headache type Was concerned over headache and visual changes.  This has since resolved but encouraged to follow up with ophthalmologist due to symptoms.   2. Essential hypertension Elevated today. Continues on metoprolol daily. Does not wish to start additional medication at this time but agreeable to increase metoprolol to 25 mg daily.  He will check blood pressure at home and bring readings to his next appt with cardiology.    Joshua Mercado. Joshua Mercado  [facility] [phone]

## 2023-06-17 ENCOUNTER — Other Ambulatory Visit: Payer: Self-pay | Admitting: Student

## 2023-06-17 DIAGNOSIS — F4321 Adjustment disorder with depressed mood: Secondary | ICD-10-CM

## 2023-06-17 DIAGNOSIS — F419 Anxiety disorder, unspecified: Secondary | ICD-10-CM

## 2023-06-18 ENCOUNTER — Ambulatory Visit: Payer: Medicare HMO | Attending: Cardiology | Admitting: Cardiology

## 2023-06-18 ENCOUNTER — Encounter: Payer: Self-pay | Admitting: Cardiology

## 2023-06-18 VITALS — BP 150/88 | HR 59 | Ht 73.0 in | Wt 195.6 lb

## 2023-06-18 DIAGNOSIS — E782 Mixed hyperlipidemia: Secondary | ICD-10-CM

## 2023-06-18 DIAGNOSIS — R42 Dizziness and giddiness: Secondary | ICD-10-CM

## 2023-06-18 DIAGNOSIS — I10 Essential (primary) hypertension: Secondary | ICD-10-CM | POA: Diagnosis not present

## 2023-06-18 DIAGNOSIS — I351 Nonrheumatic aortic (valve) insufficiency: Secondary | ICD-10-CM

## 2023-06-18 NOTE — Patient Instructions (Signed)
Medication Instructions:  - No changes *If you need a refill on your cardiac medications before your next appointment, please call your pharmacy*  Lab Work: - None ordered  Testing/Procedures: - None ordered  Follow-Up: At [facility], you and your health needs are our priority.  As part of our continuing mission to provide you with exceptional heart care, we have created designated Provider Care Teams.  These Care Teams include your primary Cardiologist (physician) and Advanced Practice Providers (APPs -  Physician Assistants and Nurse Practitioners) who all work together to provide you with the care you need, when you need it.  Your next appointment:   2 - 3 month(s)  Provider:   Charlsie Quest, NP    Other Instructions - Have [facility] fax [place] your recent blood word [phone]

## 2023-06-18 NOTE — Telephone Encounter (Signed)
Patient medication Lexapro has High Risk Warnings. Medication pend and sent to PCP Janyth Contes Janene Harvey, NP

## 2023-06-18 NOTE — Progress Notes (Signed)
Cardiology Office Note:  .   Date:  06/18/2023  ID:  Joshua Mercado, DOB 24-Nov-1945, MRN [id number] PCP: Sharon Seller, NP   HeartCare Providers Cardiologist:  Debbe Odea, MD    History of Present Illness: .   Joshua Mercado is a 77 y.o. male with a past medical history of hyperlipidemia, former smoker x 25+ years, COPD, hypertension, obstructive sleep apnea, presyncope, who is here today for follow-up.  He had previously been seen in clinic in 2021 for irregular heartbeat.  He wore an event monitor at that time that showed occasional PVC.  Echocardiogram completed showed an EF of 60% with mild aortic insufficiency.  After clinic visit 03/20/2023 where he continued to have lightheadedness and orthostatic vital signs revealed some orthostasis with systolic drop from 170s to 150s with changing positions from sitting to standing he was placed back on repeat heart monitor and had a repeat echocardiogram.  Monitor showed occasional paroxysmal SVT, 1 episode of nonsustained V. tach lasting 5 beats, frequent PACs with an 18% burden and occasional PVCs.  Overall no significant sustained arrhythmias to suggest etiology of dizziness.  It was recommended that he consider starting low-dose beta-blocker for ectopy suppression.  Repeat echocardiogram revealed an LVEF of 50-55%, no RWMA, mild LVH, mild aortic regurgitation and mild dilatation of aortic root measuring 44 mm.  He was last seen in clinic 05/31/2023 stating that from a cardiac perspective he had been doing well.  Recently had a ZIO monitor placed and echocardiogram completed.  He continues to reside at [facility].  He was started on Toprol-XL 12.5 mg at bedtime to try to suppress the ectopy.  He returns to clinic today with several questions and various symptoms.He has been keeping a blood pressure log at home.  Recently followed up with his PCP and was advised to increase his metoprolol to a whole pill during the day.  Blood pressure has been  elevated and he continues to complain of dizziness, lightheadedness, and headaches which is unusual for him.  Prior to the start of his complaints he had stopped taking his metoprolol because he thought the metoprolol was causing headaches.  After being advised to restart the metoprolol he did not trigger his headaches so he has been compliant with the current medication.  His dose slowly been increased starting 3 days ago so he is only had 2 doses.  He denies any chest pain, chest pressure, palpitations, peripheral edema, syncope or near syncope. He also states that he has been feeling a little rundown and tired as of late and is concerned that he may have COVID when he returns to [facility] is requesting a COVID test.  Denies any hospitalizations or visits to the emergency department.  ROS: 10 point review of systems has been reviewed and considered negative except what is been listed in the HPI  Studies Reviewed: Marland Kitchen        TTE 05/28/23 1. Left ventricular ejection fraction, by estimation, is 50 to 55%. Left  ventricular ejection fraction by 2D MOD biplane is 50.9 %. The left  ventricle has low normal function. The left ventricle has no regional wall  motion abnormalities. There is mild  left ventricular hypertrophy. Left ventricular diastolic parameters are  consistent with Grade I diastolic dysfunction (impaired relaxation).   2. Right ventricular systolic function is normal. The right ventricular  size is normal.   3. The mitral valve is normal in structure. No evidence of mitral valve  regurgitation.  4. The aortic valve is tricuspid. Aortic valve regurgitation is mild.   5. Aortic dilatation noted. There is mild dilatation of the aortic root,  measuring 40 mm.   6. The inferior vena cava is normal in size with <50% respiratory  variability, suggesting right atrial pressure of 8 mmHg.    Event monitor 05/10/23 Conclusion Occasional paroxysmal SVT. 1 episode of nonsustained VT lasting  5 beats. Frequent PACs, 18% burden, occasional PVCs. Overall no significant sustained arrhythmias to suggest etiology of dizziness. Will Consider starting low-dose beta-blocker for ectopy suppression. Risk Assessment/Calculations:     HYPERTENSION CONTROL Vitals:   06/18/23 0943 06/18/23 1000  BP: (!) 166/86 (!) 150/88    The patient's blood pressure is elevated above target today.  In order to address the patient's elevated BP: Blood pressure will be monitored at home to determine if medication changes need to be made. (metoprolol dosing was just changed by PCP earlier this week)          Physical Exam:   VS:  BP (!) 150/88 (BP Location: Left Arm, Patient Position: Sitting, Cuff Size: Normal)   Pulse (!) 59   Ht 6\' 1"  (1.854 m)   Wt 195 lb 9.6 oz (88.7 kg)   SpO2 98%   BMI 25.81 kg/m    Wt Readings from Last 3 Encounters:  06/18/23 195 lb 9.6 oz (88.7 kg)  06/15/23 194 lb (88 kg)  05/31/23 196 lb 3.2 oz (89 kg)    GEN: Well nourished, well developed in no acute distress NECK: No JVD; No carotid bruits CARDIAC: RRR, no murmurs, rubs, gallops RESPIRATORY:  Clear to auscultation without rales, wheezing or rhonchi  ABDOMEN: Soft, non-tender, non-distended EXTREMITIES:  No edema; No deformity   ASSESSMENT AND PLAN: .   Hypertension with blood pressure today 166/86 and recheck of 150/88.  Continues to keep a blood pressure log at home where he has varying blood pressures.  Is requesting the fax number today to have his log faxed into the office.  Recently followed with his PCP and was noted to have slightly elevated pressures.  His Toprol-XL was increased from 12.5 mg to 25 mg daily to assist with elevated pressures.  He takes his medication in the evening and is only had 2 doses of the increased dose today.  With his history of orthostasis and lightheadedness and dizziness no further medication changes were made today.  He has been encouraged to continue to monitor his pressure 1  to 2 hours postmedication administration as well.  Hyperlipidemia where he has been continued on simvastatin we have requested most recent lipid panel.  History of mild AR with recent echocardiogram revealed an LVEF of 50-55%, no RWMA, G1 DD, mild aortic regurgitation.  No significant change from prior study.  Lightheadedness and dizziness without syncope or near syncope.  He is continued on Toprol-XL 25 mg daily to suppress ectopy previously found on his ZIO XT monitoring.  Will follow-up with serial EKGs on return.       Dispo: Patient to return to clinic to see MD/APP in 2 to 3 months or sooner if needed for reevaluation of symptoms and blood pressure.  Signed, Sharman Garrott, NP

## 2023-06-19 ENCOUNTER — Other Ambulatory Visit: Payer: Self-pay | Admitting: Nurse Practitioner

## 2023-07-01 ENCOUNTER — Encounter: Payer: Self-pay | Admitting: Nurse Practitioner

## 2023-07-01 ENCOUNTER — Ambulatory Visit: Payer: Medicare HMO | Admitting: Nurse Practitioner

## 2023-07-01 VITALS — BP 138/84 | HR 66 | Temp 98.2°F | Ht 73.0 in | Wt 193.0 lb

## 2023-07-01 DIAGNOSIS — I10 Essential (primary) hypertension: Secondary | ICD-10-CM

## 2023-07-01 DIAGNOSIS — H532 Diplopia: Secondary | ICD-10-CM

## 2023-07-01 MED ORDER — METOPROLOL SUCCINATE ER 25 MG PO TB24
12.5000 mg | ORAL_TABLET | Freq: Every day | ORAL | Status: DC
Start: 2023-07-01 — End: 2023-08-26

## 2023-07-01 NOTE — Progress Notes (Signed)
Careteam: Patient Care Team: Sharon Seller, NP as PCP - General (Geriatric Medicine) Debbe Odea, MD as PCP - Cardiology (Cardiology)  Advanced Directive information Does Patient Have a Medical Advance Directive?: Yes, Type of Advance Directive: Out of facility DNR (pink MOST or yellow form), Does patient want to make changes to medical advance directive?: No - Patient declined  Allergies  Allergen Reactions   Bee Pollen Anaphylaxis    Bee Venom   Bee Venom     Throat/mouth swelling    Chief Complaint  Patient presents with   Acute Visit    Wants to change blood pressure medication due to seeing double. Started seeing double a couple of days ago.      HPI: Patient is a 77 y.o. male seen in today at the [facility] for change in blood pressure medication Reports he has been seeing double and having blind spots.  Blind spots started prior to last visit on 06/15/2023 and double vision started a few days ago (2-3 days ago).  He has been taking metoprolol whole tablet for 2 weeks and 2 days.  He took his blood pressure at home 2 days ago and was 100/? When he was seeing double.  He felt like bp may have been too low and therefore decrease to half tablet last nigh and today vision issues have improved.  Seeing normally today.   Has follow up with ophthalmology next month- that was the soonest appt he could get.   No additional headaches    Review of Systems:  Review of Systems  Constitutional:  Negative for chills, fever, malaise/fatigue and weight loss.  Eyes:  Positive for double vision. Negative for blurred vision, pain, discharge and redness.  Cardiovascular:  Negative for chest pain and palpitations.  Neurological:  Negative for dizziness, focal weakness and headaches.    Past Medical History:  Diagnosis Date   Arthritis    Knees   COPD (chronic obstructive pulmonary disease) (HCC)    Does use hearing aid    Bilateral   Enlarged prostate     GERD (gastroesophageal reflux disease)    Hypercholesterolemia    Past Surgical History:  Procedure Laterality Date   CHEEK AUGMENTATION Left 1970   REstructure of cheek bone    COLONOSCOPY  2017   Dr. Alphonsa Overall   COLONOSCOPY WITH PROPOFOL N/A 05/05/2022   Procedure: COLONOSCOPY WITH PROPOFOL;  Surgeon: Midge Minium, MD;  Location: [facility] ENDOSCOPY;  Service: Endoscopy;  Laterality: N/A;   ESOPHAGOGASTRODUODENOSCOPY (EGD) WITH PROPOFOL N/A 12/05/2020   Procedure: ESOPHAGOGASTRODUODENOSCOPY (EGD) WITH PROPOFOL;  Surgeon: Midge Minium, MD;  Location: [facility] ENDOSCOPY;  Service: Endoscopy;  Laterality: N/A;   EYE SURGERY Bilateral 2005   NASAL SEPTOPLASTY W/ TURBINOPLASTY Bilateral 12/24/2020   Procedure: NASAL SEPTOPLASTY WITH INFERIOR TURBINATE REDUCTION;  Surgeon: Bud Face, MD;  Location: [facility] ORS;  Service: ENT;  Laterality: Bilateral;   UPPER GI ENDOSCOPY     Social History:   reports that he quit smoking about 39 years ago. His smoking use included cigarettes. He started smoking about 75 years ago. He has a 35 pack-year smoking history. He has never used smokeless tobacco. He reports that he does not currently use alcohol. He reports that he does not use drugs.  Family History  Problem Relation Age of Onset   Alzheimer's disease Mother    Diabetes Mother    Heart failure Father    Heart attack Father    Diabetes Father    Dementia  Father     Medications: Patient's Medications  New Prescriptions   No medications on file  Previous Medications   ALBUTEROL (PROVENTIL) (2.5 MG/3ML) 0.083% NEBULIZER SOLUTION    Take 3 mLs (2.5 mg total) by nebulization every 6 (six) hours as needed for wheezing or shortness of breath.   APOAEQUORIN (PREVAGEN) 10 MG CAPS    Take 1 capsule by mouth daily.    ASCORBIC ACID (VITAMIN C) 1000 MG TABLET    Take 1,000 mg by mouth daily.   CHOLECALCIFEROL (VITAMIN D3) 125 MCG (5000 UT) CAPS    Take by mouth.   CLOTRIMAZOLE-BETAMETHASONE (LOTRISONE) CREAM     Apply 1 Application topically 2 (two) times daily as needed.   CYCLOBENZAPRINE (FLEXERIL) 10 MG TABLET    TAKE 1 TABLET BY MOUTH TWICE  DAILY AS NEEDED FOR MUSCLE  SPASM(S)   ESCITALOPRAM (LEXAPRO) 20 MG TABLET    Take 1 tablet by mouth once daily   FINASTERIDE (PROSCAR) 5 MG TABLET    TAKE 1 TABLET EVERY DAY   FLUTICASONE (FLONASE) 50 MCG/ACT NASAL SPRAY    USE 1 SPRAY IN BOTH NOSTRILS DAILY   MELATONIN 5 MG TABS    Take 5 mg by mouth at bedtime.    METHYLSULFONYLMETHANE (MSM) 1000 MG CAPS    Take 1 capsule by mouth 2 (two) times daily.   METOPROLOL SUCCINATE (TOPROL XL) 25 MG 24 HR TABLET    Take 1 tablet (25 mg total) by mouth daily.   MULTIPLE VITAMINS-MINERALS (MULTIVITAMIN MEN 50+) TABS    Take 1 tablet by mouth daily.   NABUMETONE (RELAFEN) 750 MG TABLET    TAKE 1 TABLET TWICE DAILY AS NEEDED   OVER THE COUNTER MEDICATION    Take 1 tablet by mouth as needed. Boosted Pro Dietary Supplement for ED (gummy formulation)   PANTOPRAZOLE (PROTONIX) 40 MG TABLET    TAKE 1 TABLET EVERY DAY   SELENIUM 200 MCG TABS TABLET    Take by mouth daily.   SIMVASTATIN (ZOCOR) 40 MG TABLET    Take 20 mg by mouth daily.   TAMSULOSIN (FLOMAX) 0.4 MG CAPS CAPSULE    TAKE 1 CAPSULE EVERY DAY   TEMAZEPAM (RESTORIL) 15 MG CAPSULE    TAKE 1 CAPSULE AT BEDTIME AS NEEDED FOR SLEEP   TESTOSTERONE 2 MG/24HR PT24    Place 1 patch onto the skin as needed.   TIOTROPIUM BROMIDE MONOHYDRATE (SPIRIVA RESPIMAT) 2.5 MCG/ACT AERS    Inhale 2 puffs into the lungs daily.   TRIAMCINOLONE CREAM (KENALOG) 0.1 %    APPLY  CREAM EXTERNALLY TO AFFECTED AREA TWICE DAILY   VITAMIN B-12 (CYANOCOBALAMIN) 500 MCG TABLET    Take 500 mcg by mouth daily.  Modified Medications   No medications on file  Discontinued Medications   No medications on file    Physical Exam:  Vitals:   07/01/23 0815  BP: 138/84  Pulse: 66  Temp: 98.2 F (36.8 C)  SpO2: 97%  Weight: 193 lb (87.5 kg)  Height: 6\' 1"  (1.854 m)   Body mass index is 25.46  kg/m. Wt Readings from Last 3 Encounters:  07/01/23 193 lb (87.5 kg)  06/18/23 195 lb 9.6 oz (88.7 kg)  06/15/23 194 lb (88 kg)    Physical Exam Constitutional:      General: He is not in acute distress.    Appearance: He is well-developed. He is not diaphoretic.  HENT:     Head: Normocephalic and atraumatic.  Right Ear: External ear normal.     Left Ear: External ear normal.     Mouth/Throat:     Pharynx: No oropharyngeal exudate.  Eyes:     Conjunctiva/sclera: Conjunctivae normal.     Pupils: Pupils are equal, round, and reactive to light.  Cardiovascular:     Rate and Rhythm: Normal rate and regular rhythm.     Heart sounds: Normal heart sounds.  Pulmonary:     Effort: Pulmonary effort is normal.     Breath sounds: Normal breath sounds.  Abdominal:     General: Bowel sounds are normal.     Palpations: Abdomen is soft.  Musculoskeletal:        General: No tenderness.     Cervical back: Normal range of motion and neck supple.     Right lower leg: No edema.     Left lower leg: No edema.  Skin:    General: Skin is warm and dry.  Neurological:     Mental Status: He is alert and oriented to person, place, and time.    Labs reviewed: Basic Metabolic Panel: Recent Labs    08/27/22 0000  NA 138  K 4.0  CL 104  CO2 27*  BUN 20  CREATININE 0.8  CALCIUM 9.9   Liver Function Tests: Recent Labs    08/27/22 0000  AST 20  ALT 20  ALKPHOS 65  ALBUMIN 4.5   No results for input(s): "LIPASE", "AMYLASE" in the last 8760 hours. No results for input(s): "AMMONIA" in the last 8760 hours. CBC: Recent Labs    08/27/22 0000  WBC 5.7  NEUTROABS 2,690.00  HGB 16.2  HCT 46  PLT 130*   Lipid Panel: Recent Labs    08/27/22 0000  CHOL 189  HDL 37  LDLCALC 120  TRIG 409*   TSH: No results for input(s): "TSH" in the last 8760 hours. A1C: No results found for: "HGBA1C"   Assessment/Plan .1. Essential hypertension Stable at this time, noted low BP on  whole tablet with double vision and therefore decreased back to half table. Vision has improved at his time. Will continue to use metoprolol 12.5 mg daily and monitor BP at home - metoprolol succinate (TOPROL XL) 25 MG 24 hr tablet; Take 0.5 tablets (12.5 mg total) by mouth daily.  2. Double vision (Primary) Noted for 2 days, has resolved with decreasing bp medication -he plans to also follow up with ophthalmologist due to changes in vision that have occurred over the last month. At this time no double or blurred vision.   Janene Harvey. Biagio Borg  [facility] [phone]

## 2023-07-15 DIAGNOSIS — I493 Ventricular premature depolarization: Secondary | ICD-10-CM | POA: Insufficient documentation

## 2023-07-15 DIAGNOSIS — G4733 Obstructive sleep apnea (adult) (pediatric): Secondary | ICD-10-CM | POA: Diagnosis not present

## 2023-07-15 DIAGNOSIS — R030 Elevated blood-pressure reading, without diagnosis of hypertension: Secondary | ICD-10-CM | POA: Insufficient documentation

## 2023-07-15 DIAGNOSIS — I498 Other specified cardiac arrhythmias: Secondary | ICD-10-CM | POA: Insufficient documentation

## 2023-07-15 DIAGNOSIS — I351 Nonrheumatic aortic (valve) insufficiency: Secondary | ICD-10-CM | POA: Insufficient documentation

## 2023-07-15 DIAGNOSIS — E782 Mixed hyperlipidemia: Secondary | ICD-10-CM | POA: Diagnosis not present

## 2023-07-16 ENCOUNTER — Ambulatory Visit: Payer: Medicare HMO | Admitting: Cardiology

## 2023-07-26 DIAGNOSIS — H40009 Preglaucoma, unspecified, unspecified eye: Secondary | ICD-10-CM | POA: Diagnosis not present

## 2023-07-26 DIAGNOSIS — H40003 Preglaucoma, unspecified, bilateral: Secondary | ICD-10-CM | POA: Diagnosis not present

## 2023-07-27 ENCOUNTER — Ambulatory Visit: Payer: Medicare HMO | Admitting: Nurse Practitioner

## 2023-07-27 ENCOUNTER — Encounter: Payer: Self-pay | Admitting: Nurse Practitioner

## 2023-07-27 VITALS — BP 148/92 | HR 77 | Temp 98.4°F | Ht 73.0 in | Wt 196.0 lb

## 2023-07-27 DIAGNOSIS — H539 Unspecified visual disturbance: Secondary | ICD-10-CM

## 2023-07-27 DIAGNOSIS — I10 Essential (primary) hypertension: Secondary | ICD-10-CM

## 2023-07-27 MED ORDER — LOSARTAN POTASSIUM 25 MG PO TABS: 25.0000 mg | ORAL_TABLET | Freq: Every day | ORAL | 1 refills | Status: DC

## 2023-07-27 NOTE — Progress Notes (Signed)
 Careteam: Patient Care Team: Caro Harlene POUR, NP as PCP - General (Geriatric Medicine) Darliss Rogue, MD as PCP - Cardiology (Cardiology)  PLACE OF SERVICE:  [facility]  Advanced Directive information Does Patient Have a Medical Advance Directive?: Yes, Type of Advance Directive: Out of facility DNR (pink MOST or yellow form), Does patient want to make changes to medical advance directive?: No - Patient declined  Allergies  Allergen Reactions   Bee Pollen Anaphylaxis    Bee Venom   Bee Venom     Throat/mouth swelling    Chief Complaint  Patient presents with   Acute Visit    Wants to discuss concerns with blood pressures running up and down. Cardiologist in [place]  took patient off of Blood pressure medication 2 weeks ago.    HPI: Patient is a 78 y.o. male who is here for a follow up for hypertension and vision changes. He saw the ophthalmologist yesterday on 07/26/23, pt states per ophthalmology his vision is stable and he will see them again routinely in one year. His last eye exam was April 2024.   Pt stopped his metoprolol  on 07/15/23 per his cardiologist in [place] . Initial BP today in clinic is 152/94 and recheck is 148/92. Pt states he checks his BP at home daily and his diastolic is consistently in the high 80s and systolic in the 150s.   He denies any vision issues today. He states his vision changes have all resolved since discontinuing metoprolol . He denies any dizziness, palpitations, headache, chest pain, and difficulty breathing.   Pt states, I don't eat as healthy as I did when my wife was alive.   Review of Systems:  Review of Systems  Constitutional: Negative.   HENT:  Positive for hearing loss.   Eyes: Negative.   Respiratory: Negative.    Cardiovascular: Negative.   Gastrointestinal: Negative.   Genitourinary: Negative.   Musculoskeletal: Negative.   Skin: Negative.   Neurological: Negative.   Endo/Heme/Allergies: Negative.    Psychiatric/Behavioral: Negative.     Past Medical History:  Diagnosis Date   Arthritis    Knees   COPD (chronic obstructive pulmonary disease) (HCC)    Does use hearing aid    Bilateral   Enlarged prostate    GERD (gastroesophageal reflux disease)    Hypercholesterolemia    Past Surgical History:  Procedure Laterality Date   CHEEK AUGMENTATION Left 1970   REstructure of cheek bone    COLONOSCOPY  2017   Dr. Nellie   COLONOSCOPY WITH PROPOFOL  N/A 05/05/2022   Procedure: COLONOSCOPY WITH PROPOFOL ;  Surgeon: Jinny Carmine, MD;  Location: [facility] ENDOSCOPY;  Service: Endoscopy;  Laterality: N/A;   ESOPHAGOGASTRODUODENOSCOPY (EGD) WITH PROPOFOL  N/A 12/05/2020   Procedure: ESOPHAGOGASTRODUODENOSCOPY (EGD) WITH PROPOFOL ;  Surgeon: Jinny Carmine, MD;  Location: [facility] ENDOSCOPY;  Service: Endoscopy;  Laterality: N/A;   EYE SURGERY Bilateral 2005   NASAL SEPTOPLASTY W/ TURBINOPLASTY Bilateral 12/24/2020   Procedure: NASAL SEPTOPLASTY WITH INFERIOR TURBINATE REDUCTION;  Surgeon: Milissa Hamming, MD;  Location: [facility] ORS;  Service: ENT;  Laterality: Bilateral;   UPPER GI ENDOSCOPY     Social History:   reports that he quit smoking about 40 years ago. His smoking use included cigarettes. He started smoking about 75 years ago. He has a 35 pack-year smoking history. He has never used smokeless tobacco. He reports that he does not currently use alcohol. He reports that he does not use drugs.  Family History  Problem Relation Age of Onset   Alzheimer's  disease Mother    Diabetes Mother    Heart failure Father    Heart attack Father    Diabetes Father    Dementia Father     Medications: Patient's Medications  New Prescriptions   No medications on file  Previous Medications   ALBUTEROL  (PROVENTIL ) (2.5 MG/3ML) 0.083% NEBULIZER SOLUTION    Take 3 mLs (2.5 mg total) by nebulization every 6 (six) hours as needed for wheezing or shortness of breath.   APOAEQUORIN (PREVAGEN) 10 MG CAPS    Take 1  capsule by mouth daily.    ASCORBIC ACID  (VITAMIN C ) 1000 MG TABLET    Take 1,000 mg by mouth daily.   CHOLECALCIFEROL  (VITAMIN D3) 125 MCG (5000 UT) CAPS    Take by mouth.   CLOTRIMAZOLE -BETAMETHASONE  (LOTRISONE ) CREAM    Apply 1 Application topically 2 (two) times daily as needed.   CYCLOBENZAPRINE  (FLEXERIL ) 10 MG TABLET    TAKE 1 TABLET BY MOUTH TWICE  DAILY AS NEEDED FOR MUSCLE  SPASM(S)   ESCITALOPRAM  (LEXAPRO ) 20 MG TABLET    Take 1 tablet by mouth once daily   FINASTERIDE  (PROSCAR ) 5 MG TABLET    TAKE 1 TABLET EVERY DAY   FLUTICASONE  (FLONASE ) 50 MCG/ACT NASAL SPRAY    USE 1 SPRAY IN BOTH NOSTRILS DAILY   MELATONIN 5 MG TABS    Take 5 mg by mouth at bedtime.    METHYLSULFONYLMETHANE (MSM) 1000 MG CAPS    Take 1 capsule by mouth 2 (two) times daily.   METOPROLOL  SUCCINATE (TOPROL  XL) 25 MG 24 HR TABLET    Take 0.5 tablets (12.5 mg total) by mouth daily.   MULTIPLE VITAMINS-MINERALS (MULTIVITAMIN MEN 50+) TABS    Take 1 tablet by mouth daily.   NABUMETONE  (RELAFEN ) 750 MG TABLET    TAKE 1 TABLET TWICE DAILY AS NEEDED   OVER THE COUNTER MEDICATION    Take 1 tablet by mouth as needed. Boosted Pro Dietary Supplement for ED (gummy formulation)   PANTOPRAZOLE  (PROTONIX ) 40 MG TABLET    TAKE 1 TABLET EVERY DAY   SELENIUM  200 MCG TABS TABLET    Take by mouth daily.   SIMVASTATIN  (ZOCOR ) 40 MG TABLET    Take 20 mg by mouth daily.   TAMSULOSIN  (FLOMAX ) 0.4 MG CAPS CAPSULE    TAKE 1 CAPSULE EVERY DAY   TEMAZEPAM  (RESTORIL ) 15 MG CAPSULE    TAKE 1 CAPSULE AT BEDTIME AS NEEDED FOR SLEEP   TESTOSTERONE  2 MG/24HR PT24    Place 1 patch onto the skin as needed.   TIOTROPIUM BROMIDE  MONOHYDRATE (SPIRIVA  RESPIMAT) 2.5 MCG/ACT AERS    Inhale 2 puffs into the lungs daily.   TRIAMCINOLONE  CREAM (KENALOG ) 0.1 %    APPLY  CREAM EXTERNALLY TO AFFECTED AREA TWICE DAILY   VITAMIN B-12 (CYANOCOBALAMIN ) 500 MCG TABLET    Take 500 mcg by mouth daily.  Modified Medications   No medications on file  Discontinued  Medications   No medications on file    Physical Exam:  Vitals:   07/27/23 1012 07/27/23 1018  BP: (!) 152/94 (!) 148/92  Pulse: 77   Temp: 98.4 F (36.9 C)   SpO2: 97%   Weight: 196 lb (88.9 kg)   Height: 6' 1 (1.854 m)    Body mass index is 25.86 kg/m. Wt Readings from Last 3 Encounters:  07/27/23 196 lb (88.9 kg)  07/01/23 193 lb (87.5 kg)  06/18/23 195 lb 9.6 oz (88.7 kg)   Physical Exam Vitals  reviewed.  Constitutional:      Appearance: Normal appearance.  HENT:     Head: Normocephalic and atraumatic.     Right Ear: External ear normal.     Left Ear: External ear normal.     Nose: Nose normal.     Mouth/Throat:     Mouth: Mucous membranes are moist.     Pharynx: Oropharynx is clear.  Eyes:     Conjunctiva/sclera: Conjunctivae normal.  Cardiovascular:     Rate and Rhythm: Normal rate and regular rhythm.     Pulses: Normal pulses.  Pulmonary:     Effort: Pulmonary effort is normal.     Breath sounds: Normal breath sounds.  Abdominal:     General: Bowel sounds are normal.     Palpations: Abdomen is soft.  Musculoskeletal:        General: Normal range of motion.     Cervical back: Normal range of motion.     Right lower leg: Edema present.     Left lower leg: Edema present.  Skin:    General: Skin is warm and dry.     Capillary Refill: Capillary refill takes less than 2 seconds.  Neurological:     Mental Status: He is alert and oriented to person, place, and time. Mental status is at baseline.  Psychiatric:        Mood and Affect: Mood normal.    Labs reviewed: Basic Metabolic Panel: Recent Labs    08/27/22 0000  NA 138  K 4.0  CL 104  CO2 27*  BUN 20  CREATININE 0.8  CALCIUM  9.9   Liver Function Tests: Recent Labs    08/27/22 0000  AST 20  ALT 20  ALKPHOS 65  ALBUMIN 4.5   No results for input(s): LIPASE, AMYLASE in the last 8760 hours. No results for input(s): AMMONIA in the last 8760 hours. CBC: Recent Labs     08/27/22 0000  WBC 5.7  NEUTROABS 2,690.00  HGB 16.2  HCT 46  PLT 130*   Lipid Panel: Recent Labs    08/27/22 0000  CHOL 189  HDL 37  LDLCALC 120  TRIG 800*   TSH: No results for input(s): TSH in the last 8760 hours. A1C: No results found for: HGBA1C  Assessment/Plan   1. Essential hypertension (Primary) Worse since off metoprolol . He would like to restart medication but try something different due to side effects  -Start losartan  (COZAAR ) 25 MG tablet daily -Pt asked to monitor BP at home and bring log at next visit in 1 month. -Discussed dietary changes and low sodium options  2. Visual disturbances -resolved, denies any visual changes today -Continue following up with ophthalmology for routine eye exams.  Follow up in 1 month.  Waylan Rabon, RN DNP-AGPCNP Student -I personally was present during the history, physical exam and medical decision-making activities of this service and have verified that the service and findings are accurately documented in the student's note Satish Hammers K. Caro BODILY [facility] [phone]

## 2023-07-27 NOTE — Patient Instructions (Signed)
 To start losartan 25 mg by mouth daily for blood pressure  To check blood pressure 1 hour AFTER you have had your medication Make sure you have been sitting at least 5 mins  Record and let us know.  Goal <140/90

## 2023-07-27 NOTE — Progress Notes (Deleted)
 Careteam: Patient Care Team: Caro Harlene POUR, NP as PCP - General (Geriatric Medicine) Darliss Rogue, MD as PCP - Cardiology (Cardiology)  PLACE OF SERVICE:  [facility]  Advanced Directive information    Allergies  Allergen Reactions   Bee Pollen Anaphylaxis    Bee Venom   Bee Venom     Throat/mouth swelling    Chief Complaint  Patient presents with   Acute Visit    Wants to discuss concerns with blood pressures running up and down.      HPI: Patient is a 78 y.o. male for follow up.  Pt saw eye doc yesterday, pt states they said eyes look fine, goes back in one year. Last eye exam was April 2024. Stopped metoprolol  07/14/22 per cardiology. BP today 152/94 and recheck is 148/92. Pt states he takes BP everyday and the diastolic is consistently high 19d. Sbp  150s.  Denies vision issues today, all resolved, denies dizziness, palpitations, headache, chest pain, no worsening breathing issues. Pt states he doesn't eat as healthy as he did when wife was allowed. Pt snacks, drinks caffiene free soda.   Review of Systems:  Review of Systems  Constitutional:  Negative for chills, fever and weight loss.  HENT:  Negative for tinnitus.   Respiratory:  Negative for cough, sputum production and shortness of breath.   Cardiovascular:  Negative for chest pain, palpitations and leg swelling.  Gastrointestinal:  Negative for abdominal pain, constipation, diarrhea and heartburn.  Genitourinary:  Negative for dysuria, frequency and urgency.  Musculoskeletal:  Negative for back pain, falls, joint pain and myalgias.  Skin: Negative.   Neurological:  Negative for dizziness and headaches.  Psychiatric/Behavioral:  Negative for depression and memory loss. The patient does not have insomnia.     Past Medical History:  Diagnosis Date   Arthritis    Knees   COPD (chronic obstructive pulmonary disease) (HCC)    Does use hearing aid    Bilateral   Enlarged prostate    GERD  (gastroesophageal reflux disease)    Hypercholesterolemia    Past Surgical History:  Procedure Laterality Date   CHEEK AUGMENTATION Left 1970   REstructure of cheek bone    COLONOSCOPY  2017   Dr. Nellie   COLONOSCOPY WITH PROPOFOL  N/A 05/05/2022   Procedure: COLONOSCOPY WITH PROPOFOL ;  Surgeon: Jinny Carmine, MD;  Location: [facility] ENDOSCOPY;  Service: Endoscopy;  Laterality: N/A;   ESOPHAGOGASTRODUODENOSCOPY (EGD) WITH PROPOFOL  N/A 12/05/2020   Procedure: ESOPHAGOGASTRODUODENOSCOPY (EGD) WITH PROPOFOL ;  Surgeon: Jinny Carmine, MD;  Location: [facility] ENDOSCOPY;  Service: Endoscopy;  Laterality: N/A;   EYE SURGERY Bilateral 2005   NASAL SEPTOPLASTY W/ TURBINOPLASTY Bilateral 12/24/2020   Procedure: NASAL SEPTOPLASTY WITH INFERIOR TURBINATE REDUCTION;  Surgeon: Milissa Hamming, MD;  Location: [facility] ORS;  Service: ENT;  Laterality: Bilateral;   UPPER GI ENDOSCOPY     Social History:   reports that he quit smoking about 40 years ago. His smoking use included cigarettes. He started smoking about 75 years ago. He has a 35 pack-year smoking history. He has never used smokeless tobacco. He reports that he does not currently use alcohol. He reports that he does not use drugs.  Family History  Problem Relation Age of Onset   Alzheimer's disease Mother    Diabetes Mother    Heart failure Father    Heart attack Father    Diabetes Father    Dementia Father     Medications: Patient's Medications  New Prescriptions   No  medications on file  Previous Medications   ALBUTEROL  (PROVENTIL ) (2.5 MG/3ML) 0.083% NEBULIZER SOLUTION    Take 3 mLs (2.5 mg total) by nebulization every 6 (six) hours as needed for wheezing or shortness of breath.   APOAEQUORIN (PREVAGEN) 10 MG CAPS    Take 1 capsule by mouth daily.    ASCORBIC ACID  (VITAMIN C ) 1000 MG TABLET    Take 1,000 mg by mouth daily.   CHOLECALCIFEROL  (VITAMIN D3) 125 MCG (5000 UT) CAPS    Take by mouth.   CLOTRIMAZOLE -BETAMETHASONE  (LOTRISONE ) CREAM     Apply 1 Application topically 2 (two) times daily as needed.   CYCLOBENZAPRINE  (FLEXERIL ) 10 MG TABLET    TAKE 1 TABLET BY MOUTH TWICE  DAILY AS NEEDED FOR MUSCLE  SPASM(S)   ESCITALOPRAM  (LEXAPRO ) 20 MG TABLET    Take 1 tablet by mouth once daily   FINASTERIDE  (PROSCAR ) 5 MG TABLET    TAKE 1 TABLET EVERY DAY   FLUTICASONE  (FLONASE ) 50 MCG/ACT NASAL SPRAY    USE 1 SPRAY IN BOTH NOSTRILS DAILY   MELATONIN 5 MG TABS    Take 5 mg by mouth at bedtime.    METHYLSULFONYLMETHANE (MSM) 1000 MG CAPS    Take 1 capsule by mouth 2 (two) times daily.   METOPROLOL  SUCCINATE (TOPROL  XL) 25 MG 24 HR TABLET    Take 0.5 tablets (12.5 mg total) by mouth daily.   MULTIPLE VITAMINS-MINERALS (MULTIVITAMIN MEN 50+) TABS    Take 1 tablet by mouth daily.   NABUMETONE  (RELAFEN ) 750 MG TABLET    TAKE 1 TABLET TWICE DAILY AS NEEDED   OVER THE COUNTER MEDICATION    Take 1 tablet by mouth as needed. Boosted Pro Dietary Supplement for ED (gummy formulation)   PANTOPRAZOLE  (PROTONIX ) 40 MG TABLET    TAKE 1 TABLET EVERY DAY   SELENIUM  200 MCG TABS TABLET    Take by mouth daily.   SIMVASTATIN  (ZOCOR ) 40 MG TABLET    Take 20 mg by mouth daily.   TAMSULOSIN  (FLOMAX ) 0.4 MG CAPS CAPSULE    TAKE 1 CAPSULE EVERY DAY   TEMAZEPAM  (RESTORIL ) 15 MG CAPSULE    TAKE 1 CAPSULE AT BEDTIME AS NEEDED FOR SLEEP   TESTOSTERONE  2 MG/24HR PT24    Place 1 patch onto the skin as needed.   TIOTROPIUM BROMIDE  MONOHYDRATE (SPIRIVA  RESPIMAT) 2.5 MCG/ACT AERS    Inhale 2 puffs into the lungs daily.   TRIAMCINOLONE  CREAM (KENALOG ) 0.1 %    APPLY  CREAM EXTERNALLY TO AFFECTED AREA TWICE DAILY   VITAMIN B-12 (CYANOCOBALAMIN ) 500 MCG TABLET    Take 500 mcg by mouth daily.  Modified Medications   No medications on file  Discontinued Medications   No medications on file    Physical Exam:  Vitals:   07/27/23 1012  BP: (!) 152/94  Pulse: 77  Temp: 98.4 F (36.9 C)  SpO2: 97%  Weight: 196 lb (88.9 kg)  Height: 6' 1 (1.854 m)   Body mass index is  25.86 kg/m. Wt Readings from Last 3 Encounters:  07/27/23 196 lb (88.9 kg)  07/01/23 193 lb (87.5 kg)  06/18/23 195 lb 9.6 oz (88.7 kg)    Physical Exam Constitutional:      General: He is not in acute distress.    Appearance: He is well-developed. He is not diaphoretic.  HENT:     Head: Normocephalic and atraumatic.     Right Ear: External ear normal.     Left Ear: External ear  normal.     Mouth/Throat:     Pharynx: No oropharyngeal exudate.  Eyes:     Conjunctiva/sclera: Conjunctivae normal.     Pupils: Pupils are equal, round, and reactive to light.  Cardiovascular:     Rate and Rhythm: Normal rate and regular rhythm.     Heart sounds: Normal heart sounds.  Pulmonary:     Effort: Pulmonary effort is normal.     Breath sounds: Normal breath sounds.  Abdominal:     General: Bowel sounds are normal.     Palpations: Abdomen is soft.  Musculoskeletal:        General: No tenderness.     Cervical back: Normal range of motion and neck supple.     Right lower leg: No edema.     Left lower leg: No edema.  Skin:    General: Skin is warm and dry.  Neurological:     Mental Status: He is alert and oriented to person, place, and time.   ***  Labs reviewed: Basic Metabolic Panel: Recent Labs    08/27/22 0000  NA 138  K 4.0  CL 104  CO2 27*  BUN 20  CREATININE 0.8  CALCIUM  9.9   Liver Function Tests: Recent Labs    08/27/22 0000  AST 20  ALT 20  ALKPHOS 65  ALBUMIN 4.5   No results for input(s): LIPASE, AMYLASE in the last 8760 hours. No results for input(s): AMMONIA in the last 8760 hours. CBC: Recent Labs    08/27/22 0000  WBC 5.7  NEUTROABS 2,690.00  HGB 16.2  HCT 46  PLT 130*   Lipid Panel: Recent Labs    08/27/22 0000  CHOL 189  HDL 37  LDLCALC 120  TRIG 800*   TSH: No results for input(s): TSH in the last 8760 hours. A1C: No results found for: HGBA1C   Assessment/Plan There are no diagnoses linked to this encounter.  No  follow-ups on file.: ***  Waylan Rabon, RN DNP-AGPCNP Student *** Harlene POUR. Caro BODILY [facility] [phone]

## 2023-08-17 ENCOUNTER — Ambulatory Visit: Payer: Medicare PPO | Admitting: Nurse Practitioner

## 2023-08-25 ENCOUNTER — Telehealth: Payer: Self-pay | Admitting: Cardiology

## 2023-08-25 DIAGNOSIS — M170 Bilateral primary osteoarthritis of knee: Secondary | ICD-10-CM | POA: Diagnosis not present

## 2023-08-25 NOTE — Telephone Encounter (Signed)
   Pre-operative Risk Assessment    Patient Name: Joshua Mercado  DOB: 11-30-1945 MRN: [id number]   Date of last office visit: 04/16/2023 Date of next office visit: NONE   Request for Surgical Clearance    Procedure:   right total knee arthroplasty  Date of Surgery:  Clearance TBD                                Surgeon:  Dr. Malon Kindle Surgeon's Group or Practice Name:  [facility] Phone number:  [phone] Jamison Oka Fax number:  [phone]   Type of Clearance Requested:   - Medical    Type of Anesthesia:  Spinal and General   Additional requests/questions:    SignedRoyann Shivers   08/25/2023, 4:43 PM

## 2023-08-26 ENCOUNTER — Encounter: Payer: Self-pay | Admitting: Nurse Practitioner

## 2023-08-26 ENCOUNTER — Ambulatory Visit: Payer: Medicare HMO | Admitting: Nurse Practitioner

## 2023-08-26 VITALS — BP 142/84 | HR 64 | Temp 98.3°F | Ht 73.0 in | Wt 197.0 lb

## 2023-08-26 DIAGNOSIS — M170 Bilateral primary osteoarthritis of knee: Secondary | ICD-10-CM | POA: Diagnosis not present

## 2023-08-26 DIAGNOSIS — I10 Essential (primary) hypertension: Secondary | ICD-10-CM | POA: Diagnosis not present

## 2023-08-26 MED ORDER — LOSARTAN POTASSIUM 50 MG PO TABS
50.0000 mg | ORAL_TABLET | Freq: Every day | ORAL | 1 refills | Status: DC
Start: 2023-08-26 — End: 2023-09-02

## 2023-08-26 NOTE — Progress Notes (Signed)
Careteam: Patient Care Team: Sharon Seller, NP as PCP - General (Geriatric Medicine) Debbe Odea, MD as PCP - Cardiology (Cardiology) PLACE OF SERVICE:  [facility]   Advanced Directive information    Allergies  Allergen Reactions   Bee Pollen Anaphylaxis    Bee Venom   Bee Venom     Throat/mouth swelling    Chief Complaint  Patient presents with   Medical Management of Chronic Issues    Medical Management of Chronic Issues. Follow up Blood Pressure. Having Knee Replacement surgery 11/2023 with Dr. Ranell Patrick with [facility].      HPI: Patient is a 78 y.o. male seen in today at [facility]  Discussed the use of AI scribe software for clinical note transcription with the patient, who gave verbal consent to proceed.  History of Present Illness   Add Dinapoli is a 78 year old male with hypertension who presents for blood pressure management and he will need pre-operative evaluation for knee replacement surgery in the near future.   He is currently taking Losartan 25 mg daily for hypertension. His home blood pressure monitoring shows consistently elevated systolic readings ranging from the 150s to 170s, with only occasional normal readings. He has stopped taking Metoprolol due to side effects. No dizziness, lightheadedness, chest pain, shortness of breath, or leg swelling. He follows a low sodium diet.   He is planning to have both knees replaced, starting with the right knee. He recently visited an orthopedist. He experiences knee pain primarily when walking rather than sitting. He lives alone and he plans to undergo rehabilitation post-surgery.      Review of Systems:  Review of Systems  Constitutional:  Negative for chills, fever and weight loss.  HENT:  Negative for tinnitus.   Respiratory:  Negative for cough, sputum production and shortness of breath.   Cardiovascular:  Negative for chest pain, palpitations and leg swelling.  Gastrointestinal:   Negative for abdominal pain, constipation, diarrhea and heartburn.  Genitourinary:  Negative for dysuria, frequency and urgency.  Musculoskeletal:  Positive for joint pain. Negative for back pain, falls and myalgias.  Skin: Negative.   Neurological:  Negative for dizziness and headaches.  Psychiatric/Behavioral:  Negative for depression and memory loss. The patient does not have insomnia.     Past Medical History:  Diagnosis Date   Arthritis    Knees   COPD (chronic obstructive pulmonary disease) (HCC)    Does use hearing aid    Bilateral   Enlarged prostate    GERD (gastroesophageal reflux disease)    Hypercholesterolemia    Past Surgical History:  Procedure Laterality Date   CHEEK AUGMENTATION Left 1970   REstructure of cheek bone    COLONOSCOPY  2017   Dr. Alphonsa Overall   COLONOSCOPY WITH PROPOFOL N/A 05/05/2022   Procedure: COLONOSCOPY WITH PROPOFOL;  Surgeon: Midge Minium, MD;  Location: [facility] ENDOSCOPY;  Service: Endoscopy;  Laterality: N/A;   ESOPHAGOGASTRODUODENOSCOPY (EGD) WITH PROPOFOL N/A 12/05/2020   Procedure: ESOPHAGOGASTRODUODENOSCOPY (EGD) WITH PROPOFOL;  Surgeon: Midge Minium, MD;  Location: [facility] ENDOSCOPY;  Service: Endoscopy;  Laterality: N/A;   EYE SURGERY Bilateral 2005   NASAL SEPTOPLASTY W/ TURBINOPLASTY Bilateral 12/24/2020   Procedure: NASAL SEPTOPLASTY WITH INFERIOR TURBINATE REDUCTION;  Surgeon: Bud Face, MD;  Location: [facility] ORS;  Service: ENT;  Laterality: Bilateral;   UPPER GI ENDOSCOPY     Social History:   reports that he quit smoking about 40 years ago. His smoking use included cigarettes. He started  smoking about 75 years ago. He has a 35 pack-year smoking history. He has never used smokeless tobacco. He reports that he does not currently use alcohol. He reports that he does not use drugs.  Family History  Problem Relation Age of Onset   Alzheimer's disease Mother    Diabetes Mother    Heart failure Father    Heart attack Father    Diabetes  Father    Dementia Father     Medications: Patient's Medications  New Prescriptions   No medications on file  Previous Medications   ALBUTEROL (PROVENTIL) (2.5 MG/3ML) 0.083% NEBULIZER SOLUTION    Take 3 mLs (2.5 mg total) by nebulization every 6 (six) hours as needed for wheezing or shortness of breath.   APOAEQUORIN (PREVAGEN) 10 MG CAPS    Take 1 capsule by mouth daily.    ASCORBIC ACID (VITAMIN C) 1000 MG TABLET    Take 1,000 mg by mouth daily.   CHOLECALCIFEROL (VITAMIN D3) 125 MCG (5000 UT) CAPS    Take by mouth.   CLOTRIMAZOLE-BETAMETHASONE (LOTRISONE) CREAM    Apply 1 Application topically 2 (two) times daily as needed.   CYCLOBENZAPRINE (FLEXERIL) 10 MG TABLET    TAKE 1 TABLET BY MOUTH TWICE  DAILY AS NEEDED FOR MUSCLE  SPASM(S)   ESCITALOPRAM (LEXAPRO) 20 MG TABLET    Take 1 tablet by mouth once daily   FINASTERIDE (PROSCAR) 5 MG TABLET    TAKE 1 TABLET EVERY DAY   FLUTICASONE (FLONASE) 50 MCG/ACT NASAL SPRAY    USE 1 SPRAY IN BOTH NOSTRILS DAILY   MELATONIN 5 MG TABS    Take 5 mg by mouth at bedtime.    METHYLSULFONYLMETHANE (MSM) 1000 MG CAPS    Take 1 capsule by mouth 2 (two) times daily.   MULTIPLE VITAMINS-MINERALS (MULTIVITAMIN MEN 50+) TABS    Take 1 tablet by mouth daily.   NABUMETONE (RELAFEN) 750 MG TABLET    TAKE 1 TABLET TWICE DAILY AS NEEDED   OVER THE COUNTER MEDICATION    Take 1 tablet by mouth as needed. Boosted Pro Dietary Supplement for ED (gummy formulation)   PANTOPRAZOLE (PROTONIX) 40 MG TABLET    TAKE 1 TABLET EVERY DAY   SELENIUM 200 MCG TABS TABLET    Take by mouth daily.   SIMVASTATIN (ZOCOR) 40 MG TABLET    Take 20 mg by mouth daily.   TAMSULOSIN (FLOMAX) 0.4 MG CAPS CAPSULE    TAKE 1 CAPSULE EVERY DAY   TEMAZEPAM (RESTORIL) 15 MG CAPSULE    TAKE 1 CAPSULE AT BEDTIME AS NEEDED FOR SLEEP   TESTOSTERONE 2 MG/24HR PT24    Place 1 patch onto the skin as needed.   TIOTROPIUM BROMIDE MONOHYDRATE (SPIRIVA RESPIMAT) 2.5 MCG/ACT AERS    Inhale 2 puffs into the  lungs daily.   TRIAMCINOLONE CREAM (KENALOG) 0.1 %    APPLY  CREAM EXTERNALLY TO AFFECTED AREA TWICE DAILY   VITAMIN B-12 (CYANOCOBALAMIN) 500 MCG TABLET    Take 500 mcg by mouth daily.  Modified Medications   Modified Medication Previous Medication   LOSARTAN (COZAAR) 50 MG TABLET losartan (COZAAR) 25 MG tablet      Take 1 tablet (50 mg total) by mouth daily.    Take 1 tablet (25 mg total) by mouth daily.  Discontinued Medications   METOPROLOL SUCCINATE (TOPROL XL) 25 MG 24 HR TABLET    Take 0.5 tablets (12.5 mg total) by mouth daily.    Physical Exam:  Vitals:  08/26/23 1039 08/26/23 1043  BP: (!) 144/86 (!) 142/84  Pulse: 64   Temp: 98.3 F (36.8 C)   SpO2: 96%   Weight: 197 lb (89.4 kg)   Height: 6\' 1"  (1.854 m)    Body mass index is 25.99 kg/m. Wt Readings from Last 3 Encounters:  08/26/23 197 lb (89.4 kg)  07/27/23 196 lb (88.9 kg)  07/01/23 193 lb (87.5 kg)    Physical Exam Constitutional:      General: He is not in acute distress.    Appearance: He is well-developed. He is not diaphoretic.  HENT:     Head: Normocephalic and atraumatic.     Right Ear: External ear normal.     Left Ear: External ear normal.     Mouth/Throat:     Pharynx: No oropharyngeal exudate.  Eyes:     Conjunctiva/sclera: Conjunctivae normal.     Pupils: Pupils are equal, round, and reactive to light.  Cardiovascular:     Rate and Rhythm: Normal rate and regular rhythm.     Heart sounds: Normal heart sounds.  Pulmonary:     Effort: Pulmonary effort is normal.     Breath sounds: Normal breath sounds.  Abdominal:     General: Bowel sounds are normal.     Palpations: Abdomen is soft.  Musculoskeletal:        General: No tenderness.     Cervical back: Normal range of motion and neck supple.     Right lower leg: No edema.     Left lower leg: No edema.  Skin:    General: Skin is warm and dry.  Neurological:     Mental Status: He is alert and oriented to person, place, and time.      Labs reviewed: Basic Metabolic Panel: Recent Labs    08/27/22 0000  NA 138  K 4.0  CL 104  CO2 27*  BUN 20  CREATININE 0.8  CALCIUM 9.9   Liver Function Tests: Recent Labs    08/27/22 0000  AST 20  ALT 20  ALKPHOS 65  ALBUMIN 4.5   No results for input(s): "LIPASE", "AMYLASE" in the last 8760 hours. No results for input(s): "AMMONIA" in the last 8760 hours. CBC: Recent Labs    08/27/22 0000  WBC 5.7  NEUTROABS 2,690.00  HGB 16.2  HCT 46  PLT 130*   Lipid Panel: Recent Labs    08/27/22 0000  CHOL 189  HDL 37  LDLCALC 120  TRIG 098*   TSH: No results for input(s): "TSH" in the last 8760 hours. A1C: No results found for: "HGBA1C"   Assessment/Plan Assessment and Plan    Hypertension Blood pressure readings consistently elevated (140s-170s systolic) despite Losartan 25mg  daily. No reported side effects from Losartan. -Increase Losartan to 50mg  daily. -Continue home blood pressure monitoring, ideally 2 hours after medication and after at least 5 minutes of rest. -Recheck blood pressure in office in 1 month.  Bilateral Knee Osteoarthritis Plans for bilateral knee replacement surgeries, starting with the right knee. Surgery scheduled for May 9th. -Optimize blood pressure control prior to surgery. -Coordinate with orthopedic surgeon and cardiologist to ensure patient is medically optimized for surgery.  Follow-up -Return to clinic in 1 month for blood pressure check. -Continue low sodium diet. -Continue home blood pressure monitoring.     Janene Harvey. Biagio Borg  [facility] [phone]

## 2023-08-26 NOTE — Telephone Encounter (Signed)
   Name: Akiel Fennell  DOB: [birth year]  MRN: [id number]  Primary Cardiologist: Debbe Odea, MD  Chart reviewed as part of pre-operative protocol coverage. The patient has an upcoming visit scheduled with Charlsie Quest, NP on 09/15/2023 at which time clearance can be addressed in case there are any issues that would impact surgical recommendations.  Surgery schedule date is TBD as below. I added preop FYI to appointment note so that provider is aware to address at time of outpatient visit.  Per office protocol the cardiology provider should forward their finalized clearance decision and recommendations regarding antiplatelet therapy to the requesting party below.    I will route this message as FYI to requesting party and remove this message from the preop box as separate preop APP input not needed at this time.   Please call with any questions.  Denyce Robert, NP  08/26/2023, 8:15 AM

## 2023-08-26 NOTE — Patient Instructions (Signed)
Increase losartan to 50 mg daily Low sodium diet.

## 2023-09-02 ENCOUNTER — Ambulatory Visit: Payer: Medicare HMO | Attending: Cardiology | Admitting: Cardiology

## 2023-09-02 ENCOUNTER — Encounter: Payer: Self-pay | Admitting: Cardiology

## 2023-09-02 VITALS — BP 150/80 | HR 69 | Ht 73.0 in | Wt 195.4 lb

## 2023-09-02 DIAGNOSIS — I351 Nonrheumatic aortic (valve) insufficiency: Secondary | ICD-10-CM

## 2023-09-02 DIAGNOSIS — I10 Essential (primary) hypertension: Secondary | ICD-10-CM | POA: Diagnosis not present

## 2023-09-02 DIAGNOSIS — Z01810 Encounter for preprocedural cardiovascular examination: Secondary | ICD-10-CM

## 2023-09-02 DIAGNOSIS — E782 Mixed hyperlipidemia: Secondary | ICD-10-CM

## 2023-09-02 MED ORDER — LOSARTAN POTASSIUM 50 MG PO TABS: 75.0000 mg | ORAL_TABLET | Freq: Every day | ORAL | 11 refills | Status: DC

## 2023-09-02 NOTE — Progress Notes (Signed)
 Cardiology Office Note:  .   Date:  09/02/2023  ID:  Joshua Mercado, DOB 11-Sep-1945, MRN [id number] PCP: Sharon Seller, NP  [facility] Providers Cardiologist:  Debbe Odea, MD    History of Present Illness: .   Joshua Mercado is a 78 y.o. male with a past medical history of hyperlipidemia, prostatic obstructive 5+ years, COPD, hypertension, obstructive sleep apnea, presyncope, who is here today for follow-up requiring cardiovascular examination for upcoming   He had previously been seen in clinic in 2021 for irregular heartbeat. He wore an event monitor at that time that showed occasional PVC. Echocardiogram completed showed an EF of 60% with mild aortic insufficiency. After clinic visit 03/20/2023 where he continued to have lightheadedness and orthostatic vital signs revealed some orthostasis with systolic drop from 170s to 150s with changing positions from sitting to standing he was placed back on repeat heart monitor and had a repeat echocardiogram. Monitor showed occasional paroxysmal SVT, 1 episode of nonsustained V. tach lasting 5 beats, frequent PACs with an 18% burden and occasional PVCs. Overall no significant sustained arrhythmias to suggest etiology of dizziness. It was recommended that he consider starting low-dose beta-blocker for ectopy suppression. Repeat echocardiogram revealed an LVEF of 50-55%, no RWMA, mild LVH, mild aortic regurgitation and mild dilatation of aortic root measuring 44 mm.    He was last seen in clinic on 06/18/2023.  At that time he had several questions related to his blood pressure and had been given his blood pressure log at home.  He had recently followed up with his PCP and been advised last to increase his metoprolol 12 pill during the day.  Blood pressure been elevated and he continued to complain of dizziness, lightheadedness and headaches which was unusual for him.  Prior to the start of his complaints he had stopped taking his metoprolol because he  thought the metoprolol was causing his headaches.  He had slowly been increasing starting 3 days ago and only had 2 doses when he was seen in the clinic.  His blood pressure was suboptimally controlled.  He was advised to take 25 mg of bisoprolol and monitor his pressures 1 to 2 hours after he had taken his medication.  There were no further changes made to his medications or further testing that was ordered.  He returns to clinic today requesting a preoperative cardiovascular exam for upcoming knee replacement surgery.  He continues to endorse joint pain and fatigue from his knees.  Has pending surgery upcoming in May.  Denies any chest pain, shortness of breath, peripheral edema, or palpitations.  States that he has been compliant with his current medication regimen without any side effects.  Had noted increase in blood pressure and increase losartan approximately 1 week ago to have elevated blood pressures and continues to keep a log at home.  Denies any hospitalizations or visits to the emergency department.  ROS: 10 point review of systems has been reviewed and considered negative except what is been listed in the HPI  Studies Reviewed: Marland Kitchen   EKG Interpretation Date/Time:  Thursday September 02 2023 14:13:24 EST Ventricular Rate:  69 PR Interval:  160 QRS Duration:  96 QT Interval:  390 QTC Calculation: 417 R Axis:   26  Text Interpretation: Normal sinus rhythm with sinus arrhythmia Normal ECG When compared with ECG of 31-May-2023 13:26, Confirmed by Charlsie Quest ([id number]) on 09/02/2023 2:18:46 PM    TTE 05/28/23 1. Left ventricular ejection fraction, by estimation, is 50 to  55%. Left  ventricular ejection fraction by 2D MOD biplane is 50.9 %. The left  ventricle has low normal function. The left ventricle has no regional wall  motion abnormalities. There is mild  left ventricular hypertrophy. Left ventricular diastolic parameters are  consistent with Grade I diastolic dysfunction (impaired  relaxation).   2. Right ventricular systolic function is normal. The right ventricular  size is normal.   3. The mitral valve is normal in structure. No evidence of mitral valve  regurgitation.   4. The aortic valve is tricuspid. Aortic valve regurgitation is mild.   5. Aortic dilatation noted. There is mild dilatation of the aortic root,  measuring 40 mm.   6. The inferior vena cava is normal in size with <50% respiratory  variability, suggesting right atrial pressure of 8 mmHg.    Event monitor 05/10/23 Conclusion Occasional paroxysmal SVT. 1 episode of nonsustained VT lasting 5 beats. Frequent PACs, 18% burden, occasional PVCs. Overall no significant sustained arrhythmias to suggest etiology of dizziness. Will Consider starting low-dose beta-blocker for ectopy suppression. Risk Assessment/Calculations:     HYPERTENSION CONTROL Vitals:   09/02/23 1410 09/02/23 1420  BP: (!) 146/78 (!) 150/80    The patient's blood pressure is elevated above target today.  In order to address the patient's elevated BP: A current anti-hypertensive medication was adjusted today.          Physical Exam:   VS:  BP (!) 150/80 (BP Location: Left Arm, Patient Position: Sitting, Cuff Size: Normal)   Pulse 69   Ht 6\' 1"  (1.854 m)   Wt 195 lb 6.4 oz (88.6 kg)   SpO2 97%   BMI 25.78 kg/m    Wt Readings from Last 3 Encounters:  09/02/23 195 lb 6.4 oz (88.6 kg)  08/26/23 197 lb (89.4 kg)  07/27/23 196 lb (88.9 kg)    GEN: Well nourished, well developed in no acute distress NECK: No JVD; No carotid bruits CARDIAC: RRR, no murmurs, rubs, gallops RESPIRATORY:  Clear to auscultation without rales, wheezing or rhonchi  ABDOMEN: Soft, non-tender, non-distended EXTREMITIES:  No edema; No deformity   ASSESSMENT AND PLAN: .   Primary hypertension with blood pressure today 146/78 and 150/88.  Losartan has been increased to 50 mg daily approximately 1 week ago and he has continued to keep a blood  pressure log.  He has been advised that his blood pressure continues to remain 140/70 to decrease his losartan to 75 mg daily.  He has upcoming surgery and had been advised that if his blood pressure is little well-controlled anesthesia may not need his surgery.  He has been encouraged to continue to monitor his pressure 1 to 2 hours postmedication administration.  Hyperlipidemia where he has been continued on 20 mg daily.  He has his yearly labs coming up with his PCP.  History of mild AR with most recent echocardiogram completed in 05/2023 with an LVEF of 50 to 35%, no RWMA, G1 DD, and mild aortic regurgitation.  No significant change from prior study will continue to monitor with surveillance studies.  Preoperative cardiovascular examination for patient is any symptoms of decompensation. EKG today showed SR/SA rate of 69 bpm with no change from prior studies.  According ACC/AHA guidelines no further cardiovascular testing is needed.  The patient may proceed with surgery acceptable risk.    Joshua Mercado perioperative risk of a major cardiac event is 0.4% according to the Revised Cardiac Risk Index (RCRI).  Therefore, he is at low risk  for perioperative complications.   His functional capacity is fair at 4.64 METs according to the Duke Activity Status Index (DASI). Recommendations: According to ACC/AHA guidelines, no further cardiovascular testing needed.  The patient may proceed to surgery at acceptable risk.         Dispo: Patient to return to clinic to see MD/APP in 6 months or sooner if needed  to re-evaluate symptoms  Signed, Sirius Woodford, NP

## 2023-09-02 NOTE — Patient Instructions (Signed)
 Medication Instructions:  Increase the Losartan to 75 mg once daily (a tablet and a half)  *If you need a refill on your cardiac medications before your next appointment, please call your pharmacy*   Lab Work: None ordered If you have labs (blood work) drawn today and your tests are completely normal, you will receive your results only by: MyChart Message (if you have MyChart) OR A paper copy in the mail If you have any lab test that is abnormal or we need to change your treatment, we will call you to review the results.   Testing/Procedures: None ordered   Follow-Up: At [facility], you and your health needs are our priority.  As part of our continuing mission to provide you with exceptional heart care, we have created designated Provider Care Teams.  These Care Teams include your primary Cardiologist (physician) and Advanced Practice Providers (APPs -  Physician Assistants and Nurse Practitioners) who all work together to provide you with the care you need, when you need it.  We recommend signing up for the patient portal called "MyChart".  Sign up information is provided on this After Visit Summary.  MyChart is used to connect with patients for Virtual Visits (Telemedicine).  Patients are able to view lab/test results, encounter notes, upcoming appointments, etc.  Non-urgent messages can be sent to your provider as well.   To learn more about what you can do with MyChart, go to [email or web address].    Your next appointment:   6 month(s)  Provider:   You may see Debbe Odea, MD or one of the following Advanced Practice Providers on your designated Care Team:   Charlsie Quest, NP

## 2023-09-08 NOTE — Telephone Encounter (Signed)
 Requesting office sent duplicate request inquiring if pt has been cleared. Pt was seen by Charlsie Quest, NP 09/02/23 for preop clearance. I have reviewed the ov notes from NP and she has cleared the pt. I will re-fax notes to surgeon's office.

## 2023-09-09 NOTE — Telephone Encounter (Signed)
 Spoke with pt regarding surgical clearance. Pt was made aware that he is cleared for surgery and was asked to follow up with surgeon's office. Pt agreed.

## 2023-09-09 NOTE — Telephone Encounter (Signed)
 Patient is calling to get update on this clearance since he was seen in office sooner. Please advise.

## 2023-09-14 ENCOUNTER — Ambulatory Visit: Admitting: Nurse Practitioner

## 2023-09-14 ENCOUNTER — Encounter: Payer: Self-pay | Admitting: Nurse Practitioner

## 2023-09-14 VITALS — BP 138/82 | HR 67 | Temp 98.4°F | Ht 73.0 in | Wt 196.0 lb

## 2023-09-14 DIAGNOSIS — Z8042 Family history of malignant neoplasm of prostate: Secondary | ICD-10-CM

## 2023-09-14 DIAGNOSIS — I10 Essential (primary) hypertension: Secondary | ICD-10-CM | POA: Diagnosis not present

## 2023-09-14 DIAGNOSIS — R0789 Other chest pain: Secondary | ICD-10-CM | POA: Diagnosis not present

## 2023-09-14 DIAGNOSIS — K219 Gastro-esophageal reflux disease without esophagitis: Secondary | ICD-10-CM

## 2023-09-14 DIAGNOSIS — R7989 Other specified abnormal findings of blood chemistry: Secondary | ICD-10-CM

## 2023-09-14 DIAGNOSIS — E782 Mixed hyperlipidemia: Secondary | ICD-10-CM

## 2023-09-14 NOTE — Progress Notes (Signed)
 Careteam: Patient Care Team: Sharon Seller, NP as PCP - General (Geriatric Medicine) Debbe Odea, MD as PCP - Cardiology (Cardiology)  PLACE OF SERVICE:  [facility]  Advanced Directive information    Allergies  Allergen Reactions   Bee Pollen Anaphylaxis    Bee Venom   Bee Venom     Throat/mouth swelling    Chief Complaint  Patient presents with   Left Sided Pain    Complains of having Pain in the Left Side on Saturday. No pain now.      HPI: Patient is a 78 y.o. male presenting for an acute visit today. He mentions that 3 days ago, he had a pain in his in left chest under left nipple line with taking deep breathes. The pain lasted most of the day and he was sore that day as well. He had exercised the day before. The pain has subsided and went away that evening. He did not take anything for the pain. He is concerned about potential pneumonia because his wife died in the last year from sepsis pneumonia and prior to her death she was complaining of pain to her lungs and back.   He is a former smoker, quit in 1985. He did not have contact with anyone sick lately that he is aware of. He does have acid reflux but does not take protonix daily, only takes it as needed. A few days before this incident, he felt a gas pain to upper left chest that went away with no interventions. He mentions he takes his meds with food. Last chest x ray from 11/10/22 was normal and EKG from 09/02/23 was normal, HR 69 sinus rhythm with sinus arrhythmia.   He was recently seen due to elevated blood pressures. BP today is 138/82. He recently saw cardiology and was recommended to increase losartan from 50 mg daily to 75 mg daily. He only started this yesterday.  He is using his albuterol inhaler about once a week. He denies any changes in appetite.  He mentions that he saw an ENT specialist in 2022 for a nasal septoplasty and at the time an incidental finding of a "lump" in his throat was  found. It was found to be benign and he is no longer followed by ENT. At that time he had some issues swallowing.   He mentions occasionally, depending on what he is eating, he finds himself clearing his throat more often. His voice is hoarse at baseline. He uses his Flonase for allergies about once a month.   Review of Systems:  Review of Systems  Constitutional: Negative.   HENT: Negative.    Respiratory: Negative.    Cardiovascular: Negative.   Gastrointestinal:  Positive for heartburn.  Neurological: Negative.   Psychiatric/Behavioral: Negative.      Past Medical History:  Diagnosis Date   Arthritis    Knees   COPD (chronic obstructive pulmonary disease) (HCC)    Does use hearing aid    Bilateral   Enlarged prostate    GERD (gastroesophageal reflux disease)    Hypercholesterolemia    Past Surgical History:  Procedure Laterality Date   CHEEK AUGMENTATION Left 1970   REstructure of cheek bone    COLONOSCOPY  2017   Dr. Alphonsa Overall   COLONOSCOPY WITH PROPOFOL N/A 05/05/2022   Procedure: COLONOSCOPY WITH PROPOFOL;  Surgeon: Midge Minium, MD;  Location: [facility] ENDOSCOPY;  Service: Endoscopy;  Laterality: N/A;   ESOPHAGOGASTRODUODENOSCOPY (EGD) WITH PROPOFOL N/A 12/05/2020   Procedure: ESOPHAGOGASTRODUODENOSCOPY (  EGD) WITH PROPOFOL;  Surgeon: Midge Minium, MD;  Location: [facility] ENDOSCOPY;  Service: Endoscopy;  Laterality: N/A;   EYE SURGERY Bilateral 2005   NASAL SEPTOPLASTY W/ TURBINOPLASTY Bilateral 12/24/2020   Procedure: NASAL SEPTOPLASTY WITH INFERIOR TURBINATE REDUCTION;  Surgeon: Bud Face, MD;  Location: [facility] ORS;  Service: ENT;  Laterality: Bilateral;   UPPER GI ENDOSCOPY     Social History:   reports that he quit smoking about 40 years ago. His smoking use included cigarettes. He started smoking about 75 years ago. He has a 35 pack-year smoking history. He has never used smokeless tobacco. He reports that he does not currently use alcohol. He reports that he does not use  drugs.  Family History  Problem Relation Age of Onset   Alzheimer's disease Mother    Diabetes Mother    Heart failure Father    Heart attack Father    Diabetes Father    Dementia Father     Medications: Patient's Medications  New Prescriptions   No medications on file  Previous Medications   ALBUTEROL (PROVENTIL) (2.5 MG/3ML) 0.083% NEBULIZER SOLUTION    Take 3 mLs (2.5 mg total) by nebulization every 6 (six) hours as needed for wheezing or shortness of breath.   APOAEQUORIN (PREVAGEN) 10 MG CAPS    Take 1 capsule by mouth daily.    ASCORBIC ACID (VITAMIN C) 1000 MG TABLET    Take 1,000 mg by mouth daily.   CHOLECALCIFEROL (VITAMIN D3) 125 MCG (5000 UT) CAPS    Take by mouth.   CLOTRIMAZOLE-BETAMETHASONE (LOTRISONE) CREAM    Apply 1 Application topically 2 (two) times daily as needed.   CYCLOBENZAPRINE (FLEXERIL) 10 MG TABLET    TAKE 1 TABLET BY MOUTH TWICE  DAILY AS NEEDED FOR MUSCLE  SPASM(S)   ESCITALOPRAM (LEXAPRO) 20 MG TABLET    Take 1 tablet by mouth once daily   FINASTERIDE (PROSCAR) 5 MG TABLET    TAKE 1 TABLET EVERY DAY   FLUTICASONE (FLONASE) 50 MCG/ACT NASAL SPRAY    USE 1 SPRAY IN BOTH NOSTRILS DAILY   LOSARTAN (COZAAR) 50 MG TABLET    Take 1.5 tablets (75 mg total) by mouth daily.   MELATONIN 5 MG TABS    Take 5 mg by mouth at bedtime.    METHYLSULFONYLMETHANE (MSM) 1000 MG CAPS    Take 1 capsule by mouth 2 (two) times daily.   MULTIPLE VITAMINS-MINERALS (MULTIVITAMIN MEN 50+) TABS    Take 1 tablet by mouth daily.   NABUMETONE (RELAFEN) 750 MG TABLET    TAKE 1 TABLET TWICE DAILY AS NEEDED   OVER THE COUNTER MEDICATION    Take 1 tablet by mouth as needed. Boosted Pro Dietary Supplement for ED (gummy formulation)   PANTOPRAZOLE (PROTONIX) 40 MG TABLET    TAKE 1 TABLET EVERY DAY   SELENIUM 200 MCG TABS TABLET    Take by mouth daily.   SIMVASTATIN (ZOCOR) 40 MG TABLET    Take 20 mg by mouth daily.   TAMSULOSIN (FLOMAX) 0.4 MG CAPS CAPSULE    TAKE 1 CAPSULE EVERY DAY    TEMAZEPAM (RESTORIL) 15 MG CAPSULE    TAKE 1 CAPSULE AT BEDTIME AS NEEDED FOR SLEEP   TESTOSTERONE 2 MG/24HR PT24    Place 1 patch onto the skin as needed.   TIOTROPIUM BROMIDE MONOHYDRATE (SPIRIVA RESPIMAT) 2.5 MCG/ACT AERS    Inhale 2 puffs into the lungs daily.   TRIAMCINOLONE CREAM (KENALOG) 0.1 %    APPLY  CREAM  EXTERNALLY TO AFFECTED AREA TWICE DAILY   VITAMIN B-12 (CYANOCOBALAMIN) 500 MCG TABLET    Take 500 mcg by mouth daily.  Modified Medications   No medications on file  Discontinued Medications   No medications on file    Physical Exam:  Vitals:   09/14/23 0905  BP: 138/82  Pulse: 67  Temp: 98.4 F (36.9 C)  SpO2: 96%  Weight: 196 lb (88.9 kg)  Height: 6\' 1"  (1.854 m)   Body mass index is 25.86 kg/m. Wt Readings from Last 3 Encounters:  09/14/23 196 lb (88.9 kg)  09/02/23 195 lb 6.4 oz (88.6 kg)  08/26/23 197 lb (89.4 kg)    Physical Exam Vitals reviewed.  Constitutional:      Appearance: Normal appearance.  HENT:     Head: Normocephalic and atraumatic.  Cardiovascular:     Rate and Rhythm: Normal rate and regular rhythm.     Pulses: Normal pulses.     Heart sounds: Normal heart sounds.  Pulmonary:     Effort: Pulmonary effort is normal.     Breath sounds: Normal breath sounds.  Abdominal:     General: Bowel sounds are increased.     Palpations: Abdomen is soft.  Neurological:     Mental Status: He is alert and oriented to person, place, and time.  Psychiatric:        Mood and Affect: Mood normal.        Behavior: Behavior normal.    Labs reviewed: Basic Metabolic Panel: No results for input(s): "NA", "K", "CL", "CO2", "GLUCOSE", "BUN", "CREATININE", "CALCIUM", "MG", "PHOS", "TSH" in the last 8760 hours. Liver Function Tests: No results for input(s): "AST", "ALT", "ALKPHOS", "BILITOT", "PROT", "ALBUMIN" in the last 8760 hours. No results for input(s): "LIPASE", "AMYLASE" in the last 8760 hours. No results for input(s): "AMMONIA" in the last 8760  hours. CBC: No results for input(s): "WBC", "NEUTROABS", "HGB", "HCT", "MCV", "PLT" in the last 8760 hours. Lipid Panel: No results for input(s): "CHOL", "HDL", "LDLCALC", "TRIG", "CHOLHDL", "LDLDIRECT" in the last 8760 hours. TSH: No results for input(s): "TSH" in the last 8760 hours. A1C: No results found for: "HGBA1C"   Assessment/Plan  1. Gastroesophageal reflux disease without esophagitis Educated to take Take Protonix 40 mg tablet once daily instead of as needed Continue lifestyle modifications   2. Chest pain, musculoskeletal -soreness after exercising.  -resolved at this time time -lung sounds clear without chest pains at this time -no signs of pneumonia Reassurance given.  Follow up precautions discussed  3. Essential hypertension (Primary) - Improving, continue losartan 75 mg (50 mg 1.5 tabs) daily - Continue to monitor BP and keep written log  Continue dietary modifications   4. Low testosterone On supplement, will recheck level - Testosterone  5. Family hx of prostate cancer - PSA  6. Mixed hyperlipidemia -on zocor, will update labs  - Lipid panel   Irfat Habib, RN DNP-AGPCNP Student -I personally was present during the history, physical exam and medical decision-making activities of this service and have verified that the service and findings are accurately documented in the student's note Vestal Markin K. Biagio [facility] [phone]

## 2023-09-15 ENCOUNTER — Ambulatory Visit: Payer: Medicare HMO | Admitting: Cardiology

## 2023-09-21 ENCOUNTER — Ambulatory Visit: Payer: Medicare HMO | Admitting: Nurse Practitioner

## 2023-09-21 DIAGNOSIS — I10 Essential (primary) hypertension: Secondary | ICD-10-CM | POA: Diagnosis not present

## 2023-09-21 MED ORDER — LOSARTAN POTASSIUM 100 MG PO TABS: 100.0000 mg | ORAL_TABLET | Freq: Every day | ORAL | 1 refills | Status: DC

## 2023-09-21 NOTE — Progress Notes (Signed)
 Careteam: Patient Care Team: Sharon Seller, NP as PCP - General (Geriatric Medicine) Debbe Odea, MD as PCP - Cardiology (Cardiology) PLACE OF SERVICE:  [facility]   Advanced Directive information    Allergies  Allergen Reactions   Bee Pollen Anaphylaxis    Bee Venom   Bee Venom     Throat/mouth swelling    Chief Complaint  Patient presents with   Medical Management of Chronic Issues    Medical Management of Chronic Issues. BP Follow up   Discussed the use of AI scribe software for clinical note transcription with the patient, who gave verbal consent to proceed.  HPI: Patient is a 78 y.o. male seen in today for blood pressure follow up.  He has been monitoring his blood pressure at home, with recent readings showing elevated levels: 160/92 mmHg this morning, 157/91 mmHg earlier this week, and 155/82 mmHg. In the office today, his blood pressure was recorded at 150/100 mmHg.  He is currently taking losartan 75 mg daily for hypertension. He recently obtained prescriptions for losartan 50 mg and 25 mg but was informed that a prescription is needed for the 100 mg dose. He takes his medication at approximately 8:30 AM daily. Cardiologist told him if he wasn't at goal to increase to 100 mg daily   No headaches, changes in vision, palpitations, or chest pain.  He denies adding salt to his food and is aware of the sodium content in processed and pre-prepared meals.      Review of Systems:  Review of Systems  Constitutional:  Negative for chills, fever and weight loss.  HENT:  Negative for tinnitus.   Respiratory:  Negative for cough, sputum production and shortness of breath.   Cardiovascular:  Negative for chest pain, palpitations and leg swelling.  Gastrointestinal:  Negative for abdominal pain, constipation, diarrhea and heartburn.  Genitourinary:  Negative for dysuria, frequency and urgency.  Musculoskeletal:  Negative for back pain, falls, joint pain  and myalgias.  Skin: Negative.   Neurological:  Negative for dizziness and headaches.  Psychiatric/Behavioral:  Negative for depression and memory loss. The patient does not have insomnia.     Past Medical History:  Diagnosis Date   Arthritis    Knees   COPD (chronic obstructive pulmonary disease) (HCC)    Does use hearing aid    Bilateral   Enlarged prostate    GERD (gastroesophageal reflux disease)    Hypercholesterolemia    Past Surgical History:  Procedure Laterality Date   CHEEK AUGMENTATION Left 1970   REstructure of cheek bone    COLONOSCOPY  2017   Dr. Alphonsa Overall   COLONOSCOPY WITH PROPOFOL N/A 05/05/2022   Procedure: COLONOSCOPY WITH PROPOFOL;  Surgeon: Midge Minium, MD;  Location: [facility] ENDOSCOPY;  Service: Endoscopy;  Laterality: N/A;   ESOPHAGOGASTRODUODENOSCOPY (EGD) WITH PROPOFOL N/A 12/05/2020   Procedure: ESOPHAGOGASTRODUODENOSCOPY (EGD) WITH PROPOFOL;  Surgeon: Midge Minium, MD;  Location: [facility] ENDOSCOPY;  Service: Endoscopy;  Laterality: N/A;   EYE SURGERY Bilateral 2005   NASAL SEPTOPLASTY W/ TURBINOPLASTY Bilateral 12/24/2020   Procedure: NASAL SEPTOPLASTY WITH INFERIOR TURBINATE REDUCTION;  Surgeon: Bud Face, MD;  Location: [facility] ORS;  Service: ENT;  Laterality: Bilateral;   UPPER GI ENDOSCOPY     Social History:   reports that he quit smoking about 40 years ago. His smoking use included cigarettes. He started smoking about 75 years ago. He has a 35 pack-year smoking history. He has never used smokeless tobacco. He reports that he  does not currently use alcohol. He reports that he does not use drugs.  Family History  Problem Relation Age of Onset   Alzheimer's disease Mother    Diabetes Mother    Heart failure Father    Heart attack Father    Diabetes Father    Dementia Father     Medications: Patient's Medications  New Prescriptions   No medications on file  Previous Medications   ALBUTEROL (PROVENTIL) (2.5 MG/3ML) 0.083% NEBULIZER SOLUTION     Take 3 mLs (2.5 mg total) by nebulization every 6 (six) hours as needed for wheezing or shortness of breath.   APOAEQUORIN (PREVAGEN) 10 MG CAPS    Take 1 capsule by mouth daily.    ASCORBIC ACID (VITAMIN C) 1000 MG TABLET    Take 1,000 mg by mouth daily.   CHOLECALCIFEROL (VITAMIN D3) 125 MCG (5000 UT) CAPS    Take by mouth.   CLOTRIMAZOLE-BETAMETHASONE (LOTRISONE) CREAM    Apply 1 Application topically 2 (two) times daily as needed.   CYCLOBENZAPRINE (FLEXERIL) 10 MG TABLET    TAKE 1 TABLET BY MOUTH TWICE  DAILY AS NEEDED FOR MUSCLE  SPASM(S)   ESCITALOPRAM (LEXAPRO) 20 MG TABLET    Take 1 tablet by mouth once daily   FINASTERIDE (PROSCAR) 5 MG TABLET    TAKE 1 TABLET EVERY DAY   FLUTICASONE (FLONASE) 50 MCG/ACT NASAL SPRAY    USE 1 SPRAY IN BOTH NOSTRILS DAILY   MELATONIN 5 MG TABS    Take 5 mg by mouth at bedtime.    METHYLSULFONYLMETHANE (MSM) 1000 MG CAPS    Take 1 capsule by mouth 2 (two) times daily.   MULTIPLE VITAMINS-MINERALS (MULTIVITAMIN MEN 50+) TABS    Take 1 tablet by mouth daily.   NABUMETONE (RELAFEN) 750 MG TABLET    TAKE 1 TABLET TWICE DAILY AS NEEDED   OVER THE COUNTER MEDICATION    Take 1 tablet by mouth as needed. Boosted Pro Dietary Supplement for ED (gummy formulation)   PANTOPRAZOLE (PROTONIX) 40 MG TABLET    TAKE 1 TABLET EVERY DAY   SELENIUM 200 MCG TABS TABLET    Take by mouth daily.   SIMVASTATIN (ZOCOR) 40 MG TABLET    Take 20 mg by mouth daily.   TAMSULOSIN (FLOMAX) 0.4 MG CAPS CAPSULE    TAKE 1 CAPSULE EVERY DAY   TEMAZEPAM (RESTORIL) 15 MG CAPSULE    TAKE 1 CAPSULE AT BEDTIME AS NEEDED FOR SLEEP   TESTOSTERONE 2 MG/24HR PT24    Place 1 patch onto the skin as needed.   TIOTROPIUM BROMIDE MONOHYDRATE (SPIRIVA RESPIMAT) 2.5 MCG/ACT AERS    Inhale 2 puffs into the lungs daily.   TRIAMCINOLONE CREAM (KENALOG) 0.1 %    APPLY  CREAM EXTERNALLY TO AFFECTED AREA TWICE DAILY   VITAMIN B-12 (CYANOCOBALAMIN) 500 MCG TABLET    Take 500 mcg by mouth daily.  Modified  Medications   Modified Medication Previous Medication   LOSARTAN (COZAAR) 100 MG TABLET losartan (COZAAR) 50 MG tablet      Take 1 tablet (100 mg total) by mouth daily.    Take 1.5 tablets (75 mg total) by mouth daily.  Discontinued Medications   No medications on file    Physical Exam:  Vitals:   09/21/23 1042 09/21/23 1047  BP: (!) 150/100 (!) 160/100  Pulse: (!) 50   Resp: 20   Temp: 97.6 F (36.4 C)   SpO2: 97%    There is no height or weight  on file to calculate BMI. Wt Readings from Last 3 Encounters:  09/14/23 196 lb (88.9 kg)  09/02/23 195 lb 6.4 oz (88.6 kg)  08/26/23 197 lb (89.4 kg)    Physical Exam  Labs reviewed: Basic Metabolic Panel: No results for input(s): "NA", "K", "CL", "CO2", "GLUCOSE", "BUN", "CREATININE", "CALCIUM", "MG", "PHOS", "TSH" in the last 8760 hours. Liver Function Tests: No results for input(s): "AST", "ALT", "ALKPHOS", "BILITOT", "PROT", "ALBUMIN" in the last 8760 hours. No results for input(s): "LIPASE", "AMYLASE" in the last 8760 hours. No results for input(s): "AMMONIA" in the last 8760 hours. CBC: No results for input(s): "WBC", "NEUTROABS", "HGB", "HCT", "MCV", "PLT" in the last 8760 hours. Lipid Panel: No results for input(s): "CHOL", "HDL", "LDLCALC", "TRIG", "CHOLHDL", "LDLDIRECT" in the last 8760 hours. TSH: No results for input(s): "TSH" in the last 8760 hours. A1C: No results found for: "HGBA1C"   Assessment/Plan    Hypertension Blood pressure remains elevated despite current losartan dosage. Discussed potential need for additional antihypertensive if uncontrolled. - Increase losartan to 100 mg daily, use two 50 mg tablets until new prescription is filled. Continue to monitor at home with BP cuff after medication taken - Recheck blood pressure in two weeks. - Monitor sodium intake, avoid processed foods.  Follow-up Will reassess blood pressure control after medication adjustment. - Schedule follow-up appointment in  two weeks on Tuesday at 11:00 AM. At [facility].  Janene Harvey. Biagio Borg  [facility] [phone]

## 2023-09-22 ENCOUNTER — Telehealth: Payer: Self-pay | Admitting: *Deleted

## 2023-09-22 NOTE — Telephone Encounter (Signed)
 Tried Calling patient, LMOM to return call.

## 2023-09-22 NOTE — Telephone Encounter (Signed)
-----   Message from Sharon Seller sent at 09/21/2023 11:23 AM EDT ----- Regarding: needs labs When you get a chance call him to see about coming to [facility] to get labs on Thursday or Monday. They are in the system.  He hasn't had them in a year

## 2023-09-27 NOTE — Telephone Encounter (Signed)
 Tried Calling patient, LMOM to return call.

## 2023-09-28 DIAGNOSIS — Z0100 Encounter for examination of eyes and vision without abnormal findings: Secondary | ICD-10-CM | POA: Diagnosis not present

## 2023-09-29 DIAGNOSIS — M1711 Unilateral primary osteoarthritis, right knee: Secondary | ICD-10-CM | POA: Diagnosis not present

## 2023-09-29 NOTE — Telephone Encounter (Signed)
Tried calling patient, LMOM to return call.

## 2023-09-30 NOTE — Telephone Encounter (Signed)
 Scheduled lab appointment for 10/11/2023. Patient is out of town until 3/27

## 2023-10-05 ENCOUNTER — Ambulatory Visit: Admitting: Nurse Practitioner

## 2023-10-07 ENCOUNTER — Ambulatory Visit: Admitting: Nurse Practitioner

## 2023-10-11 DIAGNOSIS — Z8042 Family history of malignant neoplasm of prostate: Secondary | ICD-10-CM | POA: Diagnosis not present

## 2023-10-11 DIAGNOSIS — R7989 Other specified abnormal findings of blood chemistry: Secondary | ICD-10-CM | POA: Diagnosis not present

## 2023-10-11 DIAGNOSIS — I10 Essential (primary) hypertension: Secondary | ICD-10-CM | POA: Diagnosis not present

## 2023-10-11 DIAGNOSIS — E782 Mixed hyperlipidemia: Secondary | ICD-10-CM | POA: Diagnosis not present

## 2023-10-11 LAB — COMPLETE METABOLIC PANEL WITHOUT GFR
AG Ratio: 1.6 (calc) (ref 1.0–2.5)
ALT: 16 U/L (ref 9–46)
AST: 17 U/L (ref 10–35)
Albumin: 4.2 g/dL (ref 3.6–5.1)
Alkaline phosphatase (APISO): 69 U/L (ref 35–144)
BUN: 22 mg/dL (ref 7–25)
CO2: 24 mmol/L (ref 20–32)
Calcium: 9.4 mg/dL (ref 8.6–10.3)
Chloride: 108 mmol/L (ref 98–110)
Creat: 0.77 mg/dL (ref 0.70–1.28)
Globulin: 2.6 g/dL (ref 1.9–3.7)
Glucose, Bld: 92 mg/dL (ref 65–99)
Potassium: 3.9 mmol/L (ref 3.5–5.3)
Sodium: 140 mmol/L (ref 135–146)
Total Bilirubin: 1.1 mg/dL (ref 0.2–1.2)
Total Protein: 6.8 g/dL (ref 6.1–8.1)

## 2023-10-11 LAB — LIPID PANEL
Cholesterol: 195 mg/dL (ref <200)
HDL: 48 mg/dL (ref >=40)
LDL Cholesterol (Calc): 124 mg/dL — ABNORMAL HIGH
Non-HDL Cholesterol (Calc): 147 mg/dL — ABNORMAL HIGH (ref <130)
Total CHOL/HDL Ratio: 4.1 (calc) (ref <5.0)
Triglycerides: 115 mg/dL (ref <150)

## 2023-10-11 LAB — CBC WITH DIFFERENTIAL/PLATELET
Absolute Lymphocytes: 2344 {cells}/uL (ref 850–3900)
Absolute Monocytes: 422 {cells}/uL (ref 200–950)
Basophils Absolute: 31 {cells}/uL (ref 0–200)
Basophils Relative: 0.5 %
Eosinophils Absolute: 229 {cells}/uL (ref 15–500)
Eosinophils Relative: 3.7 %
HCT: 45.7 % (ref 38.5–50.0)
Hemoglobin: 15.6 g/dL (ref 13.2–17.1)
MCH: 31.3 pg (ref 27.0–33.0)
MCHC: 34.1 g/dL (ref 32.0–36.0)
MCV: 91.8 fL (ref 80.0–100.0)
MPV: 9.5 fL (ref 7.5–12.5)
Monocytes Relative: 6.8 %
Neutro Abs: 3174 {cells}/uL (ref 1500–7800)
Neutrophils Relative %: 51.2 %
Platelets: 133 10*3/uL — ABNORMAL LOW (ref 140–400)
RBC: 4.98 10*6/uL (ref 4.20–5.80)
RDW: 13 % (ref 11.0–15.0)
Total Lymphocyte: 37.8 %
WBC: 6.2 10*3/uL (ref 3.8–10.8)

## 2023-10-11 LAB — TESTOSTERONE: Testosterone: 797 ng/dL (ref 250–827)

## 2023-10-11 LAB — PSA: PSA: 0.39 ng/mL (ref <=4.00)

## 2023-10-12 ENCOUNTER — Encounter: Payer: Self-pay | Admitting: Nurse Practitioner

## 2023-10-13 ENCOUNTER — Other Ambulatory Visit: Payer: Self-pay | Admitting: Nurse Practitioner

## 2023-10-13 DIAGNOSIS — N401 Enlarged prostate with lower urinary tract symptoms: Secondary | ICD-10-CM

## 2023-10-13 DIAGNOSIS — M170 Bilateral primary osteoarthritis of knee: Secondary | ICD-10-CM

## 2023-10-13 NOTE — Telephone Encounter (Signed)
 High risk warning populated when attempting to refill medication  Please review and approval if appropriate   Thanks,  [facility] W/CMA

## 2023-10-14 ENCOUNTER — Encounter: Payer: Self-pay | Admitting: Nurse Practitioner

## 2023-10-14 ENCOUNTER — Ambulatory Visit: Admitting: Nurse Practitioner

## 2023-10-14 VITALS — BP 136/84 | HR 84 | Temp 98.4°F | Ht 73.0 in | Wt 198.0 lb

## 2023-10-14 DIAGNOSIS — R7989 Other specified abnormal findings of blood chemistry: Secondary | ICD-10-CM

## 2023-10-14 DIAGNOSIS — F322 Major depressive disorder, single episode, severe without psychotic features: Secondary | ICD-10-CM | POA: Insufficient documentation

## 2023-10-14 DIAGNOSIS — J449 Chronic obstructive pulmonary disease, unspecified: Secondary | ICD-10-CM | POA: Diagnosis not present

## 2023-10-14 DIAGNOSIS — E782 Mixed hyperlipidemia: Secondary | ICD-10-CM

## 2023-10-14 DIAGNOSIS — I10 Essential (primary) hypertension: Secondary | ICD-10-CM

## 2023-10-14 MED ORDER — LOSARTAN POTASSIUM 100 MG PO TABS: 100.0000 mg | ORAL_TABLET | Freq: Every day | ORAL | 1 refills | Status: DC

## 2023-10-14 MED ORDER — SIMVASTATIN 40 MG PO TABS: 40.0000 mg | ORAL_TABLET | Freq: Every day | ORAL | 1 refills | Status: DC

## 2023-10-14 NOTE — Progress Notes (Signed)
 Careteam: Patient Care Team: Sharon Seller, NP as PCP - General (Geriatric Medicine) Debbe Odea, MD as PCP - Cardiology (Cardiology) PLACE OF SERVICE:  [facility]   Advanced Directive information    Allergies  Allergen Reactions   Bee Pollen Anaphylaxis    Bee Venom   Bee Venom     Throat/mouth swelling    Chief Complaint  Patient presents with   Hypertension    Blood pressure recheck     Discussed the use of AI scribe software for clinical note transcription with the patient, who gave verbal consent to proceed.Marland Kitchen  History of Present Illness   Joshua Mercado is a 78 year old male with hypertension who presents for a follow-up on blood pressure management.  He is managing hypertension with losartan 100 mg daily, with no reported side effects. Home blood pressure readings are typically around 138-140 mmHg systolic, measured 2-4 hours post-medication. In the office today, his blood pressure was initially high but rechecked at 136/84 mmHg.  He is also managing hyperlipidemia with simvastatin 20 mg daily, taken in the evening. His LDL cholesterol is slightly elevated at 124 mg/dL.  He is on Lexapro 20 mg daily for mood management and reports no increase in depressive symptoms. He has been on this medication for about a month and plans to continue it, especially with upcoming life changes including surgery and moving.  He is scheduled for a knee replacement surgery in the first week of June. Nabumetone was previously used for pain but discontinued due to side effects such as dry mouth, and he is not taking any regular medication. Uses tylenol PRN  For benign prostatic hyperplasia, he is on finasteride and tamsulosin.   He has been using testosterone supplements as needed, approximately once a week, and testosterone levels WNL.       Review of Systems:  Review of Systems  Constitutional:  Negative for chills, fever and weight loss.  HENT:  Negative for  tinnitus.   Respiratory:  Negative for cough, sputum production and shortness of breath.   Cardiovascular:  Negative for chest pain, palpitations and leg swelling.  Gastrointestinal:  Negative for abdominal pain, constipation, diarrhea and heartburn.  Genitourinary:  Negative for dysuria, frequency and urgency.  Musculoskeletal:  Negative for back pain, falls, joint pain and myalgias.  Skin: Negative.   Neurological:  Negative for dizziness and headaches.  Psychiatric/Behavioral:  Negative for depression and memory loss. The patient does not have insomnia.     Past Medical History:  Diagnosis Date   Arthritis    Knees   COPD (chronic obstructive pulmonary disease) (HCC)    Does use hearing aid    Bilateral   Enlarged prostate    GERD (gastroesophageal reflux disease)    Hypercholesterolemia    Past Surgical History:  Procedure Laterality Date   CHEEK AUGMENTATION Left 1970   REstructure of cheek bone    COLONOSCOPY  2017   Dr. Alphonsa Overall   COLONOSCOPY WITH PROPOFOL N/A 05/05/2022   Procedure: COLONOSCOPY WITH PROPOFOL;  Surgeon: Midge Minium, MD;  Location[facility] ENDOSCOPY;  Service: Endoscopy;  Laterality: N/A;   ESOPHAGOGASTRODUODENOSCOPY (EGD) WITH PROPOFOL N/A 12/05/2020   Procedure: ESOPHAGOGASTRODUODENOSCOPY (EGD) WITH PROPOFOL;  Surgeon: Midge Minium, MD;  Location: [facility] ENDOSCOPY;  Service: Endoscopy;  Laterality: N/A;   EYE SURGERY Bilateral 2005   NASAL SEPTOPLASTY W/ TURBINOPLASTY Bilateral 12/24/2020   Procedure: NASAL SEPTOPLASTY WITH INFERIOR TURBINATE REDUCTION;  Surgeon: Bud Face, MD;  Location: [facility] ORS;  Service: ENT;  Laterality: Bilateral;   UPPER GI ENDOSCOPY     Social History:   reports that he quit smoking about 40 years ago. His smoking use included cigarettes. He started smoking about 75 years ago. He has a 35 pack-year smoking history. He has never used smokeless tobacco. He reports that he does not currently use alcohol. He reports that he does not  use drugs.  Family History  Problem Relation Age of Onset   Alzheimer's disease Mother    Diabetes Mother    Heart failure Father    Heart attack Father    Diabetes Father    Dementia Father     Medications: Patient's Medications  New Prescriptions   No medications on file  Previous Medications   ALBUTEROL (PROVENTIL) (2.5 MG/3ML) 0.083% NEBULIZER SOLUTION    Take 3 mLs (2.5 mg total) by nebulization every 6 (six) hours as needed for wheezing or shortness of breath.   APOAEQUORIN (PREVAGEN) 10 MG CAPS    Take 1 capsule by mouth daily.    ASCORBIC ACID (VITAMIN C) 1000 MG TABLET    Take 1,000 mg by mouth daily.   CHOLECALCIFEROL (VITAMIN D3) 125 MCG (5000 UT) CAPS    Take by mouth.   CLOTRIMAZOLE-BETAMETHASONE (LOTRISONE) CREAM    Apply 1 Application topically 2 (two) times daily as needed.   CYCLOBENZAPRINE (FLEXERIL) 10 MG TABLET    TAKE 1 TABLET BY MOUTH TWICE  DAILY AS NEEDED FOR MUSCLE  SPASM(S)   ESCITALOPRAM (LEXAPRO) 20 MG TABLET    Take 1 tablet by mouth once daily   FINASTERIDE (PROSCAR) 5 MG TABLET    TAKE 1 TABLET EVERY DAY   FLUTICASONE (FLONASE) 50 MCG/ACT NASAL SPRAY    USE 1 SPRAY IN BOTH NOSTRILS DAILY   LOSARTAN (COZAAR) 100 MG TABLET    Take 1 tablet (100 mg total) by mouth daily.   MELATONIN 5 MG TABS    Take 5 mg by mouth at bedtime.    METHYLSULFONYLMETHANE (MSM) 1000 MG CAPS    Take 1 capsule by mouth 2 (two) times daily.   MULTIPLE VITAMINS-MINERALS (MULTIVITAMIN MEN 50+) TABS    Take 1 tablet by mouth daily.   NABUMETONE (RELAFEN) 750 MG TABLET    TAKE 1 TABLET TWICE DAILY AS NEEDED   OVER THE COUNTER MEDICATION    Take 1 tablet by mouth as needed. Boosted Pro Dietary Supplement for ED (gummy formulation)   PANTOPRAZOLE (PROTONIX) 40 MG TABLET    TAKE 1 TABLET EVERY DAY   SELENIUM 200 MCG TABS TABLET    Take by mouth daily.   SIMVASTATIN (ZOCOR) 40 MG TABLET    Take 20 mg by mouth daily.   TAMSULOSIN (FLOMAX) 0.4 MG CAPS CAPSULE    TAKE 1 CAPSULE EVERY DAY    TEMAZEPAM (RESTORIL) 15 MG CAPSULE    TAKE 1 CAPSULE AT BEDTIME AS NEEDED FOR SLEEP   TESTOSTERONE 2 MG/24HR PT24    Place 1 patch onto the skin as needed.   TIOTROPIUM BROMIDE MONOHYDRATE (SPIRIVA RESPIMAT) 2.5 MCG/ACT AERS    Inhale 2 puffs into the lungs daily.   TRIAMCINOLONE CREAM (KENALOG) 0.1 %    APPLY  CREAM EXTERNALLY TO AFFECTED AREA TWICE DAILY   VITAMIN B-12 (CYANOCOBALAMIN) 500 MCG TABLET    Take 500 mcg by mouth daily.  Modified Medications   No medications on file  Discontinued Medications   No medications on file    Physical Exam:  Vitals:   10/14/23  0825 10/14/23 0828  BP: (!) 144/90 136/84  Pulse: 84   Temp: 98.4 F (36.9 C)   SpO2: 96%   Weight: 198 lb (89.8 kg)   Height: 6\' 1"  (1.854 m)    Body mass index is 26.12 kg/m. Wt Readings from Last 3 Encounters:  10/14/23 198 lb (89.8 kg)  09/14/23 196 lb (88.9 kg)  09/02/23 195 lb 6.4 oz (88.6 kg)    Physical Exam Constitutional:      General: He is not in acute distress.    Appearance: He is well-developed. He is not diaphoretic.  HENT:     Head: Normocephalic and atraumatic.     Right Ear: External ear normal.     Left Ear: External ear normal.     Mouth/Throat:     Pharynx: No oropharyngeal exudate.  Eyes:     Conjunctiva/sclera: Conjunctivae normal.     Pupils: Pupils are equal, round, and reactive to light.  Cardiovascular:     Rate and Rhythm: Normal rate and regular rhythm.     Heart sounds: Normal heart sounds.  Pulmonary:     Effort: Pulmonary effort is normal.     Breath sounds: Normal breath sounds.  Abdominal:     General: Bowel sounds are normal.     Palpations: Abdomen is soft.  Musculoskeletal:        General: No tenderness.     Cervical back: Normal range of motion and neck supple.     Right lower leg: No edema.     Left lower leg: No edema.  Skin:    General: Skin is warm and dry.  Neurological:     Mental Status: He is alert and oriented to person, place, and time.      Labs reviewed: Basic Metabolic Panel: Recent Labs    10/11/23 0745  NA 140  K 3.9  CL 108  CO2 24  GLUCOSE 92  BUN 22  CREATININE 0.77  CALCIUM 9.4   Liver Function Tests: Recent Labs    10/11/23 0745  AST 17  ALT 16  BILITOT 1.1  PROT 6.8   No results for input(s): "LIPASE", "AMYLASE" in the last 8760 hours. No results for input(s): "AMMONIA" in the last 8760 hours. CBC: Recent Labs    10/11/23 0745  WBC 6.2  NEUTROABS 3,174  HGB 15.6  HCT 45.7  MCV 91.8  PLT 133*   Lipid Panel: Recent Labs    10/11/23 0745  CHOL 195  HDL 48  LDLCALC 124*  TRIG 115  CHOLHDL 4.1   TSH: No results for input(s): "TSH" in the last 8760 hours. A1C: No results found for: "HGBA1C"   Assessment/Plan Assessment and Plan    Knee Osteoarthritis Scheduled for knee replacement surgery in the first week of June. Nabumetone discontinued due to side effects.  Hypertension Well-controlled with losartan 100 mg daily. Emphasized low sodium diet for blood pressure control. - Continue losartan 100 mg daily. - Advise low sodium diet, avoiding high-sodium foods such as freezer meals, soups, pizza, fried foods, and sandwich meats. - Send a 90-day supply of losartan to the pharmacy.  Hyperlipidemia LDL cholesterol elevated at 124 mg/dL. Goal to reduce LDL to less than 100 mg/dL. Decision made to increase simvastatin dosage. - Increase simvastatin to 40 mg daily, taken in the evening. - Sent a 90-day supply of simvastatin to the pharmacy.  Benign Prostatic Hyperplasia (BPH) Managed with finasteride and tamsulosin. No new symptoms reported. - Continue finasteride and tamsulosin. - Sent  prescriptions to mail order pharmacy.  Depression Well-managed on escitalopram 20 mg daily. Advised to continue medication due to upcoming stressors such as surgery and moving. - Continue escitalopram 20 mg daily. - Reassess after upcoming surgery and move to consider tapering off if  well-managed.  Testosterone Supplementation Testosterone levels normal. Advised against daily use due to potential risks, including increased risk of prostate cancer. - Continue testosterone supplementation once a week as needed. - Monitor testosterone levels and prostate health.  Follow-up Upcoming knee replacement surgery and plans to move after surgery. - Schedule follow-up appointment for September 23rd at 10:40 AM. - Advise to contact the office if any issues arise before the scheduled follow-up.      Janene Harvey. Biagio Borg  [facility] [phone]

## 2023-10-19 ENCOUNTER — Encounter: Payer: Self-pay | Admitting: Nurse Practitioner

## 2023-10-19 DIAGNOSIS — M1711 Unilateral primary osteoarthritis, right knee: Secondary | ICD-10-CM | POA: Diagnosis not present

## 2023-10-19 DIAGNOSIS — M6281 Muscle weakness (generalized): Secondary | ICD-10-CM | POA: Diagnosis not present

## 2023-10-19 DIAGNOSIS — R2689 Other abnormalities of gait and mobility: Secondary | ICD-10-CM | POA: Diagnosis not present

## 2023-10-20 DIAGNOSIS — R2689 Other abnormalities of gait and mobility: Secondary | ICD-10-CM | POA: Diagnosis not present

## 2023-10-20 DIAGNOSIS — M1711 Unilateral primary osteoarthritis, right knee: Secondary | ICD-10-CM | POA: Diagnosis not present

## 2023-10-20 DIAGNOSIS — M6281 Muscle weakness (generalized): Secondary | ICD-10-CM | POA: Diagnosis not present

## 2023-10-20 MED ORDER — TADALAFIL 10 MG PO TABS: 10.0000 mg | ORAL_TABLET | Freq: Every day | ORAL | 1 refills | Status: DC | PRN

## 2023-10-20 NOTE — Addendum Note (Signed)
 Addended by: Sharon Seller on: 10/20/2023 01:43 PM   Modules accepted: Orders

## 2023-10-20 NOTE — Telephone Encounter (Signed)
 Patient came into office and stated that the medication is Tadalafil 20 mg as needed Prescribed in 2022  Would like a Rx sent to [facility] on [address]. [place].

## 2023-10-23 ENCOUNTER — Other Ambulatory Visit: Payer: Self-pay | Admitting: Gastroenterology

## 2023-10-27 DIAGNOSIS — M1711 Unilateral primary osteoarthritis, right knee: Secondary | ICD-10-CM | POA: Diagnosis not present

## 2023-10-27 DIAGNOSIS — M6281 Muscle weakness (generalized): Secondary | ICD-10-CM | POA: Diagnosis not present

## 2023-10-27 DIAGNOSIS — R2689 Other abnormalities of gait and mobility: Secondary | ICD-10-CM | POA: Diagnosis not present

## 2023-11-05 ENCOUNTER — Encounter: Payer: Self-pay | Admitting: Student

## 2023-11-05 ENCOUNTER — Ambulatory Visit: Admitting: Student

## 2023-11-05 VITALS — BP 138/86 | HR 79 | Temp 99.9°F | Ht 73.0 in | Wt 194.0 lb

## 2023-11-05 DIAGNOSIS — F419 Anxiety disorder, unspecified: Secondary | ICD-10-CM

## 2023-11-05 DIAGNOSIS — J449 Chronic obstructive pulmonary disease, unspecified: Secondary | ICD-10-CM | POA: Diagnosis not present

## 2023-11-05 DIAGNOSIS — F4321 Adjustment disorder with depressed mood: Secondary | ICD-10-CM

## 2023-11-05 MED ORDER — ESCITALOPRAM OXALATE 20 MG PO TABS
10.0000 mg | ORAL_TABLET | Freq: Every day | ORAL | Status: DC
Start: 2023-11-05 — End: 2024-01-18

## 2023-11-05 MED ORDER — PREDNISONE 20 MG PO TABS
40.0000 mg | ORAL_TABLET | Freq: Every day | ORAL | 0 refills | Status: DC
Start: 2023-11-05 — End: 2023-11-10

## 2023-11-05 NOTE — Patient Instructions (Signed)
 VISIT SUMMARY:  During your visit today, we discussed your cold symptoms, increased sputum production related to your COPD, and your current anti-anxiety medication. We reviewed your symptoms, including your slight fever and cough, and examined your lung sounds, which were clear. We also talked about your desire to taper off your anti-anxiety medication due to daytime sleepiness.  YOUR PLAN:  -CHRONIC OBSTRUCTIVE PULMONARY DISEASE (COPD): COPD is a chronic lung condition that makes it hard to breathe. You have increased white sputum production and a cough, but your lung sounds are clear, indicating no infection. We will start you on a 5-day course of oral steroids and encourage you to use your inhalers regularly, including Spiriva  and albuterol  as needed.  -ACUTE UPPER RESPIRATORY INFECTION: An acute upper respiratory infection is a common cold. You have a slight fever and a cough, but your COVID and flu tests are negative. If your symptoms do not improve by Sunday, please retest for COVID.  -ANXIETY DISORDER: An anxiety disorder is a condition that causes excessive worry or fear. Your current medication is causing daytime sleepiness, so we will start tapering it off. Reduce your dose to half (10 mg) for two weeks, then take it every other day for two weeks before stopping completely.  INSTRUCTIONS:  Please retest for COVID on Sunday if your symptoms do not improve. Follow the tapering schedule for your anti-anxiety medication as discussed. Use your inhalers regularly and complete the 5-day course of oral steroids.

## 2023-11-05 NOTE — Progress Notes (Signed)
 [facility]  Provider: Jann Melody  Goals of Care:     07/27/2023   10:18 AM  Advanced Directives  Does Patient Have a Medical Advance Directive? Yes  Type of Advance Directive Out of facility DNR (pink MOST or yellow form)  Does patient want to make changes to medical advance directive? No - Patient declined     Chief Complaint  Patient presents with   URI    URI. Covid and Flu Test performed in office: Covid Neg, Flu Neg    HPI: Patient is a 78 y.o. male seen today for an acute visit for Discussed the use of AI scribe software for clinical note transcription with the patient, who gave verbal consent to proceed.  History of Present Illness   Joshua Mercado is a 78 year old male with COPD who presents with cold symptoms and increased sputum production.  He has been experiencing symptoms of a cold, including a slight fever with a temperature of 99.73F today, up from 98.60F yesterday. He has not taken any additional COVID tests at home, preferring to wait for this visit. He is unsure of where he might have contracted the cold, which he believes is widespread.  He has a history of COPD and notes an increase in sputum production, describing it as white in color. He has been experiencing a cough and has been using his inhalers less frequently, despite spending most of his time in bed. He has been taking Mucinex for the past couple of days, which he finds helpful. He uses Spiriva  and albuterol  inhalers, with albuterol  being used approximately once a week. No vomiting or diarrhea.  He inquires about his anti-anxiety medication, noting that it has been making him sleepy during the day, prompting him to take it at night instead. He is considering tapering off the medication as it has been over six months since he started it.        Past Medical History:  Diagnosis Date   Arthritis    Knees   COPD (chronic obstructive pulmonary disease) (HCC)    Does use hearing  aid    Bilateral   Enlarged prostate    GERD (gastroesophageal reflux disease)    Hypercholesterolemia     Past Surgical History:  Procedure Laterality Date   CHEEK AUGMENTATION Left 1970   REstructure of cheek bone    COLONOSCOPY  2017   Dr. Elsa Halls   COLONOSCOPY WITH PROPOFOL  N/A 05/05/2022   Procedure: COLONOSCOPY WITH PROPOFOL ;  Surgeon: Marnee Sink, MD;  Location: [facility];  Service: Endoscopy;  Laterality: N/A;   ESOPHAGOGASTRODUODENOSCOPY (EGD) WITH PROPOFOL  N/A 12/05/2020   Procedure: ESOPHAGOGASTRODUODENOSCOPY (EGD) WITH PROPOFOL ;  Surgeon: Marnee Sink, MD;  Location: [facility];  Service: Endoscopy;  Laterality: N/A;   EYE SURGERY Bilateral 2005   NASAL SEPTOPLASTY W/ TURBINOPLASTY Bilateral 12/24/2020   Procedure: NASAL SEPTOPLASTY WITH INFERIOR TURBINATE REDUCTION;  Surgeon: Rogers Clayman, MD;  Location: [facility];  Service: ENT;  Laterality: Bilateral;   UPPER GI ENDOSCOPY      Allergies  Allergen Reactions   Bee Pollen Anaphylaxis    Bee Venom   Bee Venom     Throat/mouth swelling    Outpatient Encounter Medications as of 11/05/2023  Medication Sig   albuterol  (PROVENTIL ) (2.5 MG/3ML) 0.083% nebulizer solution Take 3 mLs (2.5 mg total) by nebulization every 6 (six) hours as needed for wheezing or shortness of breath.   Apoaequorin (PREVAGEN) 10 MG CAPS Take 1 capsule  by mouth daily.    Ascorbic Acid (VITAMIN C) 1000 MG tablet Take 1,000 mg by mouth daily.   Cholecalciferol (VITAMIN D3) 125 MCG (5000 UT) CAPS Take by mouth.   clotrimazole -betamethasone  (LOTRISONE ) cream Apply 1 Application topically 2 (two) times daily as needed.   cyclobenzaprine  (FLEXERIL ) 10 MG tablet TAKE 1 TABLET BY MOUTH TWICE  DAILY AS NEEDED FOR MUSCLE  SPASM(S)   finasteride  (PROSCAR ) 5 MG tablet TAKE 1 TABLET EVERY DAY   fluticasone  (FLONASE ) 50 MCG/ACT nasal spray USE 1 SPRAY IN BOTH NOSTRILS DAILY   losartan  (COZAAR ) 100 MG tablet Take 1 tablet (100 mg total) by mouth daily.    melatonin 5 MG TABS Take 5 mg by mouth at bedtime.    Methylsulfonylmethane (MSM) 1000 MG CAPS Take 1 capsule by mouth 2 (two) times daily.   Multiple Vitamins-Minerals (MULTIVITAMIN MEN 50+) TABS Take 1 tablet by mouth daily.   OVER THE COUNTER MEDICATION Take 1 tablet by mouth as needed. Boosted Pro Dietary Supplement for ED (gummy formulation)   pantoprazole  (PROTONIX ) 40 MG tablet TAKE 1 TABLET EVERY DAY   predniSONE  (DELTASONE ) 20 MG tablet Take 2 tablets (40 mg total) by mouth daily with breakfast for 5 days.   selenium 200 MCG TABS tablet Take by mouth daily.   simvastatin  (ZOCOR ) 40 MG tablet Take 1 tablet (40 mg total) by mouth daily.   tadalafil  (CIALIS ) 10 MG tablet Take 1-2 tablets (10-20 mg total) by mouth daily as needed for erectile dysfunction.   tamsulosin  (FLOMAX ) 0.4 MG CAPS capsule TAKE 1 CAPSULE EVERY DAY   temazepam  (RESTORIL ) 15 MG capsule TAKE 1 CAPSULE AT BEDTIME AS NEEDED FOR SLEEP   Testosterone  2 MG/24HR PT24 Place 1 patch onto the skin as needed.   Tiotropium Bromide Monohydrate  (SPIRIVA  RESPIMAT) 2.5 MCG/ACT AERS Inhale 2 puffs into the lungs daily.   triamcinolone  cream (KENALOG ) 0.1 % APPLY  CREAM EXTERNALLY TO AFFECTED AREA TWICE DAILY   vitamin B-12 (CYANOCOBALAMIN) 500 MCG tablet Take 500 mcg by mouth daily.   [DISCONTINUED] escitalopram  (LEXAPRO ) 20 MG tablet Take 1 tablet by mouth once daily   escitalopram  (LEXAPRO ) 20 MG tablet Take 0.5 tablets (10 mg total) by mouth daily.   No facility-administered encounter medications on file as of 11/05/2023.    Review of Systems:  Review of Systems  Health Maintenance  Topic Date Due   COVID-19 Vaccine (11 - Moderna risk 2024-25 season) 08/25/2024 (Originally 10/19/2023)   INFLUENZA VACCINE  02/11/2024   Medicare Annual Wellness (AWV)  03/01/2024   Colonoscopy  05/06/2027   DTaP/Tdap/Td (3 - Td or Tdap) 12/20/2032   Pneumonia Vaccine 33+ Years old  Completed   Hepatitis C Screening  Completed   Zoster  Vaccines- Shingrix  Completed   HPV VACCINES  Aged Out   Meningococcal B Vaccine  Aged Out    Physical Exam: Vitals:   11/05/23 1553  BP: 138/86  Pulse: 79  Temp: 99.9 F (37.7 C)  SpO2: 96%  Weight: 194 lb (88 kg)  Height: 6\' 1"  (1.854 m)   Body mass index is 25.6 kg/m. Physical Exam Physical Exam   VITALS: T- 99.9 CHEST: Lungs clear to auscultation bilaterally. Temperature elevated at 99.72F.       Labs reviewed: Basic Metabolic Panel: Recent Labs    10/11/23 0745  NA 140  K 3.9  CL 108  CO2 24  GLUCOSE 92  BUN 22  CREATININE 0.77  CALCIUM 9.4   Liver Function Tests: Recent Labs  10/11/23 0745  AST 17  ALT 16  BILITOT 1.1  PROT 6.8   No results for input(s): "LIPASE", "AMYLASE" in the last 8760 hours. No results for input(s): "AMMONIA" in the last 8760 hours. CBC: Recent Labs    10/11/23 0745  WBC 6.2  NEUTROABS 3,174  HGB 15.6  HCT 45.7  MCV 91.8  PLT 133*   Lipid Panel: Recent Labs    10/11/23 0745  CHOL 195  HDL 48  LDLCALC 124*  TRIG 115  CHOLHDL 4.1   No results found for: "HGBA1C"  Procedures since last visit: No results found. Results   LABS COVID-19: Negative (11/05/2023) Influenza: Negative (11/05/2023)       Assessment/Plan     Chronic obstructive pulmonary disease (COPD) COPD with increased white sputum production and cough. No increase in inhaler use. Sputum is clear, indicating no infection requiring antibiotics. Mucinex has been effective in symptom management. Lung sounds are clear on examination. - Prescribe a 5-day course of oral steroids. - Encourage regular use of inhalers, including Spiriva  and albuterol  as needed.  Acute upper respiratory infection Acute upper respiratory infection with borderline fever of 99.76F and cough. COVID and flu tests are negative. Symptoms began over 48 hours ago. Retesting for COVID is advised if symptoms persist by Sunday. - Advise retesting for COVID on Sunday if symptoms  do not improve.  Anxiety disorder Anxiety disorder managed with anti-anxiety medication. Reports excessive daytime sleepiness and desires to taper off medication. Medication has been taken for over six months, and he is ready to start dose reduction. - Initiate tapering of anti-anxiety medication by reducing to half dose (10 mg) for two weeks. - After two weeks, reduce to every other day dosing for two weeks, then discontinue.          Labs/tests ordered:  * No order type specified * Next appt:  03/07/2024

## 2023-11-08 ENCOUNTER — Ambulatory Visit: Payer: Self-pay

## 2023-11-08 DIAGNOSIS — J441 Chronic obstructive pulmonary disease with (acute) exacerbation: Secondary | ICD-10-CM

## 2023-11-08 NOTE — Telephone Encounter (Signed)
  Copied from CRM [id number]. Topic: Clinical - Red Word Triage >> Nov 08, 2023  8:58 AM Danelle Dunning F wrote: [facility] that prompted transfer to Nurse Triage:   Chief Complaint: Cough Symptoms: Productive Cough, Zadrozny sputum, runny nose  Frequency: Constant onset 1 week ago Pertinent Negatives: Patient denies fever, chest pain, SOB, headache Disposition: [] ED /[] Urgent Care (no appt availability in office) / [x] Appointment(In office/virtual)/ []  [place] [facility]/ [] Home Care/ [] Refused Recommended Disposition /[] [place] [facility]/ []  Follow-up with PCP Additional Notes: Patient reports an ongoing productive cough that started about 1 week ago. Patient states the sputum has changed from clear to [place] and that is why he is calling. Patient denies chest pain or SOB. Care advice was given and patient was offered an appointment tomorrow morning with his PCP but patient stated it was too early and requested a later appointment. Patient has been scheduled for 4/30 at 1320, advised to call back if symptoms get worse, patient verbalized understanding.  Discolored mucous (dark Fournier); feels mucous in chest; cough  Patient has had these cold symptoms for the past week. Reason for Disposition  [1] Continuous (nonstop) coughing interferes with work or school AND [2] no improvement using cough treatment per Care Advice  Answer Assessment - Initial Assessment Questions 1. ONSET: "When did the cough begin?"      1 week ago  2. SEVERITY: "How bad is the cough today?"      Moderate  3. SPUTUM: "Describe the color of your sputum" (none, dry cough; clear, white, yellow, green)     Brownish  4. HEMOPTYSIS: "Are you coughing up any blood?" If so ask: "How much?" (flecks, streaks, tablespoons, etc.)     No  5. DIFFICULTY BREATHING: "Are you having difficulty breathing?" If Yes, ask: "How bad is it?" (e.g., mild, moderate, severe)    - MILD: No SOB at rest, mild SOB with walking, speaks normally in  sentences, can lie down, no retractions, pulse < 100.    - MODERATE: SOB at rest, SOB with minimal exertion and prefers to sit, cannot lie down flat, speaks in phrases, mild retractions, audible wheezing, pulse 100-120.    - SEVERE: Very SOB at rest, speaks in single words, struggling to breathe, sitting hunched forward, retractions, pulse > 120      No  6. FEVER: "Do you have a fever?" If Yes, ask: "What is your temperature, how was it measured, and when did it start?"     Low grade fever  7. CARDIAC HISTORY: "Do you have any history of heart disease?" (e.g., heart attack, congestive heart failure)      N/A 8. LUNG HISTORY: "Do you have any history of lung disease?"  (e.g., pulmonary embolus, asthma, emphysema)     COPD  9. PE RISK FACTORS: "Do you have a history of blood clots?" (or: recent major surgery, recent prolonged travel, bedridden)     N/A 10. OTHER SYMPTOMS: "Do you have any other symptoms?" (e.g., runny nose, wheezing, chest pain)       Runny nose  11. PREGNANCY: "Is there any chance you are pregnant?" "When was your last menstrual period?"       N/A 12. TRAVEL: "Have you traveled out of the country in the last month?" (e.g., travel history, exposures)       No  Protocols used: Cough - Acute Productive-A-AH

## 2023-11-08 NOTE — Telephone Encounter (Signed)
Message routed to Dr.Beamer. 

## 2023-11-09 MED ORDER — AZITHROMYCIN 250 MG PO TABS: ORAL_TABLET | ORAL | 0 refills | Status: DC

## 2023-11-09 MED ORDER — AZITHROMYCIN 250 MG PO TABS: ORAL_TABLET | ORAL | 0 refills | Status: AC

## 2023-11-09 NOTE — Telephone Encounter (Signed)
 Copied from CRM [id number]. Topic: General - Other >> Nov 09, 2023 10:48 AM Shelby Dessert H wrote: Reason for CRM: Patient was returning call for [facility], was transferred but went to voicemail, could you please call the patient back at [phone].

## 2023-11-09 NOTE — Telephone Encounter (Signed)
 Please notify patient a prescription for antibiotics were sent to the pharmacy.

## 2023-11-09 NOTE — Addendum Note (Signed)
 Addended by: Verlyn Lambert on: 11/09/2023 07:48 AM   Modules accepted: Orders

## 2023-11-09 NOTE — Telephone Encounter (Signed)
Called patient and no answer. Voicemail was left with office call back number.   

## 2023-11-09 NOTE — Telephone Encounter (Signed)
 Patient called and notified. He states that he will go and pick up prescription now.

## 2023-11-09 NOTE — Addendum Note (Signed)
 Addended by: Emmanuelle Hibbitts on: 11/09/2023 07:46 AM   Modules accepted: Orders

## 2023-11-10 ENCOUNTER — Ambulatory Visit: Admitting: Student

## 2023-11-10 ENCOUNTER — Encounter: Payer: Self-pay | Admitting: Student

## 2023-11-10 VITALS — BP 138/86 | HR 76 | Temp 98.6°F | Ht 73.0 in | Wt 195.0 lb

## 2023-11-10 DIAGNOSIS — J441 Chronic obstructive pulmonary disease with (acute) exacerbation: Secondary | ICD-10-CM | POA: Diagnosis not present

## 2023-11-10 DIAGNOSIS — M1711 Unilateral primary osteoarthritis, right knee: Secondary | ICD-10-CM | POA: Diagnosis not present

## 2023-11-10 DIAGNOSIS — J449 Chronic obstructive pulmonary disease, unspecified: Secondary | ICD-10-CM | POA: Diagnosis not present

## 2023-11-10 DIAGNOSIS — R2689 Other abnormalities of gait and mobility: Secondary | ICD-10-CM | POA: Diagnosis not present

## 2023-11-10 DIAGNOSIS — M6281 Muscle weakness (generalized): Secondary | ICD-10-CM | POA: Diagnosis not present

## 2023-11-10 MED ORDER — PREDNISONE 20 MG PO TABS: 40.0000 mg | ORAL_TABLET | Freq: Every day | ORAL | 0 refills | Status: AC
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
## 2023-11-10 NOTE — Patient Instructions (Addendum)
 Address: [address] [place], [place] [address] Phone: [phone]  VISIT SUMMARY:  Today, we discussed your persistent cough and sputum production, which may indicate a bacterial infection related to your COPD. We also reviewed your current medications and emotional well-being.  YOUR PLAN:  -CHRONIC OBSTRUCTIVE PULMONARY DISEASE (COPD): COPD is a chronic lung condition often caused by smoking, leading to breathing difficulties. Your symptoms suggest a possible bacterial infection. We will start you on azithromycin  (Z-Pak) immediately, with two tablets today and one tablet daily thereafter. We will also extend your prednisone  course by five days. A chest x-ray will be done to check for pneumonia, and we may adjust your COPD medications if there's no improvement. Please resume your allergy medication due to high pollen levels.  -ANXIETY: Anxiety is a condition that causes feelings of worry and stress. You are currently managing it with escitalopram  (Lexapro ) 10 mg daily, which you should continue. We will reassess your anxiety management in one month.  INSTRUCTIONS:  Please get a chest x-ray as soon as possible to check for pneumonia. Start taking azithromycin  (Z-Pak) immediately, with two tablets today and one tablet daily thereafter. Continue taking prednisone  for an additional five days. Resume your allergy medication. We will reassess your anxiety management in one month.

## 2023-11-11 ENCOUNTER — Ambulatory Visit
Admission: RE | Admit: 2023-11-11 | Discharge: 2023-11-11 | Disposition: A | Discharge status: Home / Self Care | Attending: Student | Admitting: Student

## 2023-11-11 ENCOUNTER — Ambulatory Visit
Admission: RE | Admit: 2023-11-11 | Discharge: 2023-11-11 | Disposition: A | Discharge status: Home / Self Care | Source: Ambulatory Visit | Attending: Student | Admitting: Student

## 2023-11-11 DIAGNOSIS — R058 Other specified cough: Secondary | ICD-10-CM | POA: Diagnosis not present

## 2023-11-11 DIAGNOSIS — R918 Other nonspecific abnormal finding of lung field: Secondary | ICD-10-CM | POA: Diagnosis not present

## 2023-11-11 DIAGNOSIS — J441 Chronic obstructive pulmonary disease with (acute) exacerbation: Secondary | ICD-10-CM | POA: Diagnosis not present

## 2023-11-11 DIAGNOSIS — I700 Atherosclerosis of aorta: Secondary | ICD-10-CM | POA: Diagnosis not present

## 2023-11-12 ENCOUNTER — Encounter: Payer: Self-pay | Admitting: Student

## 2023-11-14 ENCOUNTER — Other Ambulatory Visit: Payer: Self-pay | Admitting: Nurse Practitioner

## 2023-11-15 ENCOUNTER — Telehealth: Payer: Self-pay | Admitting: *Deleted

## 2023-11-15 NOTE — Telephone Encounter (Signed)
 Copied from CRM [id number]. Topic: Clinical - Medication Question >> [name] 5, 2025 11:46 AM Retta Caster wrote: Reason for CRM: Patient  is taking predniSONE  (DELTASONE ) 20 MG tablet [[id number]] but Cough issue for the last 2-3 weeks he is requesting a needs antibiotic. Needs call back when updated. [phone]

## 2023-11-15 NOTE — Telephone Encounter (Signed)
 Last Appointment was with Dr. Jann Melody on 11/10/2023 Forwarded to Dr. Jann Melody.  Please Advise.

## 2023-11-16 MED ORDER — AZITHROMYCIN 250 MG PO TABS: ORAL_TABLET | ORAL | 0 refills | Status: DC

## 2023-11-16 NOTE — Telephone Encounter (Signed)
 Joshua Gehrig, MD  You11 hours ago (9:46 PM)   VB I sent him azithromycin  last week. He was supposed to take that. If he needs another one it was a 5-day z-pack.     Patient stated that he needs another one, stated that he is still coughing pretty good but feels like he needs one more round to completely clear it.

## 2023-11-17 DIAGNOSIS — M1711 Unilateral primary osteoarthritis, right knee: Secondary | ICD-10-CM | POA: Diagnosis not present

## 2023-11-17 DIAGNOSIS — M6281 Muscle weakness (generalized): Secondary | ICD-10-CM | POA: Diagnosis not present

## 2023-11-17 DIAGNOSIS — R2689 Other abnormalities of gait and mobility: Secondary | ICD-10-CM | POA: Diagnosis not present

## 2023-11-22 ENCOUNTER — Telehealth: Payer: Self-pay | Admitting: *Deleted

## 2023-11-22 NOTE — Telephone Encounter (Signed)
 Copied from CRM [id number]. Topic: Clinical - Prescription Issue >> Adamari Frede 12, 2025 11:02 AM Arlie Benedict B wrote: Reason for CRM: tadalafil  (CIALIS ) 10 MG tablet patient calling to rqst an increase to 20mg  he stated that he current 10mg  is not working if you need to speak with patient the number is  [phone]

## 2023-11-24 DIAGNOSIS — M1711 Unilateral primary osteoarthritis, right knee: Secondary | ICD-10-CM | POA: Diagnosis not present

## 2023-11-24 DIAGNOSIS — M6281 Muscle weakness (generalized): Secondary | ICD-10-CM | POA: Diagnosis not present

## 2023-11-24 DIAGNOSIS — R2689 Other abnormalities of gait and mobility: Secondary | ICD-10-CM | POA: Diagnosis not present

## 2023-11-25 DIAGNOSIS — M170 Bilateral primary osteoarthritis of knee: Secondary | ICD-10-CM | POA: Diagnosis not present

## 2023-11-25 MED ORDER — TADALAFIL 20 MG PO TABS
20.0000 mg | ORAL_TABLET | Freq: Every day | ORAL | 1 refills | Status: DC | PRN
Start: 2023-11-25 — End: 2023-12-20

## 2023-11-25 NOTE — Telephone Encounter (Signed)
 New Rx sent.

## 2023-11-25 NOTE — Telephone Encounter (Signed)
Patient Notified and agreed. 

## 2023-12-03 NOTE — Progress Notes (Addendum)
 COVID Vaccine Completed: yes  Date of COVID positive in last 90 days:  PCP - Gilbert Lab, NP Cardiologist - Constancia Delton, MD Pulmonologist- Roena Clark, PA  Cardiac clearance by Ronald Cockayne, NP 09/02/23 in Epic Medical clearance by Gilbert Lab, NP 10/14/23 in Epic   Chest x-ray - 11/11/23 Epic EKG - 09/02/23 Epic Stress Test - n/a ECHO - 05/28/23 Epic Cardiac Cath - n/a Pacemaker/ICD device last checked: n/a Spinal Cord Stimulator:n/a  Bowel Prep - no  Sleep Study - yes CPAP - not per pt preference   Fasting Blood Sugar - n/a Checks Blood Sugar _____ times a day  Last dose of GLP1 agonist-  N/A GLP1 instructions:  Hold 7 days before surgery    Last dose of SGLT-2 inhibitors-  N/A SGLT-2 instructions:  Hold 3 days before surgery    Blood Thinner Instructions:  Last dose: n/a  Time: Aspirin Instructions: Last Dose:  Activity level: Can go up a flight of stairs and perform activities of daily living without stopping and without symptoms of chest pain or shortness of breath. Slow with stairs due to knee pain   Anesthesia review: HTN, PVC, mild aortic regurgitation, COPD, OSA, thrombocytopenia,   Patient denies shortness of breath, fever, cough and chest pain at PAT appointment  Patient verbalized understanding of instructions that were given to them at the PAT appointment. Patient was also instructed that they will need to review over the PAT instructions again at home before surgery.

## 2023-12-03 NOTE — Patient Instructions (Addendum)
 SURGICAL WAITING ROOM VISITATION  Patients having surgery or a procedure may have no more than 2 support people in the waiting area - these visitors may rotate.    Children under the age of 75 must have an adult with them who is not the patient.  Due to an increase in RSV and influenza rates and associated hospitalizations, children ages 8 and under may not visit patients in [facility] hospitals.  Visitors with respiratory illnesses are discouraged from visiting and should remain at home.  If the patient needs to stay at the hospital during part of their recovery, the visitor guidelines for inpatient rooms apply. Pre-op nurse will coordinate an appropriate time for 1 support person to accompany patient in pre-op.  This support person may not rotate.    Please refer to the [facility] website for the visitor guidelines for Inpatients (after your surgery is over and you are in a regular room).    Your procedure is scheduled on: 12/17/23   Report to [facility] Main Entrance    Report to admitting at 9:25 AM   Call this number if you have problems the morning of surgery [phone]   Do not eat food :After Midnight.   After Midnight you may have the following liquids until 8:55 AM DAY OF SURGERY  Water Non-Citrus Juices (without pulp, NO RED-Apple, White grape, White cranberry) Black Coffee (NO MILK/CREAM OR CREAMERS, sugar ok)  Clear Tea (NO MILK/CREAM OR CREAMERS, sugar ok) regular and decaf                             Plain Jell-O (NO RED)                                           Fruit ices (not with fruit pulp, NO RED)                                     Popsicles (NO RED)                                                               Sports drinks like Gatorade (NO RED)                  The day of surgery:  Drink ONE (1) Pre-Surgery Clear Ensure at 8:55 AM the morning of surgery. Drink in one sitting. Do not sip.  This drink was given to you during your hospital   pre-op appointment visit. Nothing else to drink after completing the  Pre-Surgery Clear Ensure.          If you have questions, please contact your surgeon's office.   FOLLOW BOWEL PREP AND ANY ADDITIONAL PRE OP INSTRUCTIONS YOU RECEIVED FROM YOUR SURGEON'S OFFICE!!!     Oral Hygiene is also important to reduce your risk of infection.                                    Remember -  BRUSH YOUR TEETH THE MORNING OF SURGERY WITH YOUR REGULAR TOOTHPASTE  DENTURES WILL BE REMOVED PRIOR TO SURGERY PLEASE DO NOT APPLY "Poly grip" OR ADHESIVES!!!   Stop all vitamins and herbal supplements 7 days before surgery.   Take these medicines the morning of surgery with A SIP OF WATER: Inhalers, Lexapro , Flonase , Pantoprazole , Simvastatin , Tamusolin, Metoprolol                                 You may not have any metal on your body including jewelry, and body piercing             Do not wear lotions, powders, cologne, or deodorant              Men may shave face and neck.   Do not bring valuables to the hospital. [facility] IS NOT             RESPONSIBLE   FOR VALUABLES.   Contacts, glasses, dentures or bridgework may not be worn into surgery.   Bring small overnight bag day of surgery.   DO NOT BRING YOUR HOME MEDICATIONS TO THE HOSPITAL. PHARMACY WILL DISPENSE MEDICATIONS LISTED ON YOUR MEDICATION LIST TO YOU DURING YOUR ADMISSION IN THE HOSPITAL!    Special Instructions: Bring a copy of your healthcare power of attorney and living will documents the day of surgery if you haven't scanned them before.              Please read over the following fact sheets you were given: IF YOU HAVE QUESTIONS ABOUT YOUR PRE-OP INSTRUCTIONS PLEASE CALL [phone] Party    If you received a COVID test during your pre-op visit  it is requested that you wear a mask when out in public, stay away from anyone that may not be feeling well and notify your surgeon if you develop symptoms. If you test positive for  Covid or have been in contact with anyone that has tested positive in the last 10 days please notify you surgeon.      Pre-operative 5 CHG Bath Instructions   You can play a key role in reducing the risk of infection after surgery. Your skin needs to be as free of germs as possible. You can reduce the number of germs on your skin by washing with CHG (chlorhexidine  gluconate) soap before surgery. CHG is an antiseptic soap that kills germs and continues to kill germs even after washing.   DO NOT use if you have an allergy to chlorhexidine /CHG or antibacterial soaps. If your skin becomes reddened or irritated, stop using the CHG and notify one of our RNs at [phone].   Please shower with the CHG soap starting 4 days before surgery using the following schedule:     Please keep in mind the following:  DO NOT shave, including legs and underarms, starting the day of your first shower.   You may shave your face at any point before/day of surgery.  Place clean sheets on your bed the day you start using CHG soap. Use a clean washcloth (not used since being washed) for each shower. DO NOT sleep with pets once you start using the CHG.   CHG Shower Instructions:  If you choose to wash your hair and private area, wash first with your normal shampoo/soap.  After you use shampoo/soap, rinse your hair and body thoroughly to remove shampoo/soap residue.  Turn the water OFF and apply  about 3 tablespoons (45 ml) of CHG soap to a CLEAN washcloth.  Apply CHG soap ONLY FROM YOUR NECK DOWN TO YOUR TOES (washing for 3-5 minutes)  DO NOT use CHG soap on face, private areas, open wounds, or sores.  Pay special attention to the area where your surgery is being performed.  If you are having back surgery, having someone wash your back for you may be helpful. Wait 2 minutes after CHG soap is applied, then you may rinse off the CHG soap.  Pat dry with a clean towel  Put on clean clothes/pajamas   If you choose  to wear lotion, please use ONLY the CHG-compatible lotions on the back of this paper.     Additional instructions for the day of surgery: DO NOT APPLY any lotions, deodorants, cologne, or perfumes.   Put on clean/comfortable clothes.  Brush your teeth.  Ask your nurse before applying any prescription medications to the skin.      CHG Compatible Lotions   Aveeno Moisturizing lotion  Cetaphil Moisturizing Cream  Cetaphil Moisturizing Lotion  Clairol Herbal Essence Moisturizing Lotion, Dry Skin  Clairol Herbal Essence Moisturizing Lotion, Extra Dry Skin  Clairol Herbal Essence Moisturizing Lotion, Normal Skin  Curel Age Defying Therapeutic Moisturizing Lotion with Alpha Hydroxy  Curel Extreme Care Body Lotion  Curel Soothing Hands Moisturizing Hand Lotion  Curel Therapeutic Moisturizing Cream, Fragrance-Free  Curel Therapeutic Moisturizing Lotion, Fragrance-Free  Curel Therapeutic Moisturizing Lotion, Original Formula  Eucerin Daily Replenishing Lotion  Eucerin Dry Skin Therapy Plus Alpha Hydroxy Crme  Eucerin Dry Skin Therapy Plus Alpha Hydroxy Lotion  Eucerin Original Crme  Eucerin Original Lotion  Eucerin Plus Crme Eucerin Plus Lotion  Eucerin TriLipid Replenishing Lotion  Keri Anti-Bacterial Hand Lotion  Keri Deep Conditioning Original Lotion Dry Skin Formula Softly Scented  Keri Deep Conditioning Original Lotion, Fragrance Free Sensitive Skin Formula  Keri Lotion Fast Absorbing Fragrance Free Sensitive Skin Formula  Keri Lotion Fast Absorbing Softly Scented Dry Skin Formula  Keri Original Lotion  Keri Skin Renewal Lotion Keri Silky Smooth Lotion  Keri Silky Smooth Sensitive Skin Lotion  Nivea Body Creamy Conditioning Oil  Nivea Body Extra Enriched Lotion  Nivea Body Original Lotion  Nivea Body Sheer Moisturizing Lotion Nivea Crme  Nivea Skin Firming Lotion  NutraDerm 30 Skin Lotion  NutraDerm Skin Lotion  NutraDerm Therapeutic Skin Cream  NutraDerm Therapeutic  Skin Lotion  ProShield Protective Hand Cream  Provon moisturizing lotion   View Pre-Surgery Education Videos:  [email or web address]     Incentive Spirometer  An incentive spirometer is a tool that can help keep your lungs clear and active. This tool measures how well you are filling your lungs with each breath. Taking long deep breaths may help reverse or decrease the chance of developing breathing (pulmonary) problems (especially infection) following: A long period of time when you are unable to move or be active. BEFORE THE PROCEDURE  If the spirometer includes an indicator to show your best effort, your nurse or respiratory therapist will set it to a desired goal. If possible, sit up straight or lean slightly forward. Try not to slouch. Hold the incentive spirometer in an upright position. INSTRUCTIONS FOR USE  Sit on the edge of your bed if possible, or sit up as far as you can in bed or on a chair. Hold the incentive spirometer in an upright position. Breathe out normally. Place the mouthpiece in your mouth and seal your lips tightly around it. Breathe in slowly and as  deeply as possible, raising the piston or the ball toward the top of the column. Hold your breath for 3-5 seconds or for as long as possible. Allow the piston or ball to fall to the bottom of the column. Remove the mouthpiece from your mouth and breathe out normally. Rest for a few seconds and repeat Steps 1 through 7 at least 10 times every 1-2 hours when you are awake. Take your time and take a few normal breaths between deep breaths. The spirometer may include an indicator to show your best effort. Use the indicator as a goal to work toward during each repetition. After each set of 10 deep breaths, practice coughing to be sure your lungs are clear. If you have an incision (the cut made at the time of surgery), support your incision when coughing by placing a  pillow or rolled up towels firmly against it. Once you are able to get out of bed, walk around indoors and cough well. You may stop using the incentive spirometer when instructed by your caregiver.  RISKS AND COMPLICATIONS Take your time so you do not get dizzy or light-headed. If you are in pain, you may need to take or ask for pain medication before doing incentive spirometry. It is harder to take a deep breath if you are having pain. AFTER USE Rest and breathe slowly and easily. It can be helpful to keep track of a log of your progress. Your caregiver can provide you with a simple table to help with this. If you are using the spirometer at home, follow these instructions: SEEK MEDICAL CARE IF:  You are having difficultly using the spirometer. You have trouble using the spirometer as often as instructed. Your pain medication is not giving enough relief while using the spirometer. You develop fever of 100.5 F (38.1 C) or higher. SEEK IMMEDIATE MEDICAL CARE IF:  You cough up bloody sputum that had not been present before. You develop fever of 102 F (38.9 C) or greater. You develop worsening pain at or near the incision site. MAKE SURE YOU:  Understand these instructions. Will watch your condition. Will get help right away if you are not doing well or get worse. Document Released: 11/09/2006 Document Revised: 09/21/2011 Document Reviewed: 01/10/2007 [facility] Patient Information 2014 [place], [place].   ________________________________________________________________________

## 2023-12-07 NOTE — Progress Notes (Signed)
 Surgery orders requested via Epic inbox.

## 2023-12-07 NOTE — H&P (Signed)
 Patient's anticipated LOS is less than 2 midnights, meeting these requirements: - Younger than 37 - Lives within 1 hour of care - Has a competent adult at home to recover with post-op recover - NO history of  - Chronic pain requiring opiods  - Diabetes  - Coronary Artery Disease  - Heart failure  - Heart attack  - Stroke  - DVT/VTE  - Cardiac arrhythmia  - Respiratory Failure/COPD  - Renal failure  - Anemia  - Advanced Liver disease     Joshua Mercado is an 78 y.o. male.    Chief Complaint: right knee pain   HPI: Pt is a 78 y.o. male complaining of right knee pain for multiple years. Pain had continually increased since the beginning. X-rays in the clinic show end-stage arthritic changes of the right knee. Pt has tried various conservative treatments which have failed to alleviate their symptoms, including injections and therapy. Various options are discussed with the patient. Risks, benefits and expectations were discussed with the patient. Patient understand the risks, benefits and expectations and wishes to proceed with surgery.   PCP:  Verma Gobble, NP  D/C Plans: Home  PMH: Past Medical History:  Diagnosis Date   Arthritis    Knees   COPD (chronic obstructive pulmonary disease) (HCC)    Does use hearing aid    Bilateral   Enlarged prostate    GERD (gastroesophageal reflux disease)    Hypercholesterolemia     PSH: Past Surgical History:  Procedure Laterality Date   CHEEK AUGMENTATION Left 1970   REstructure of cheek bone    COLONOSCOPY  2017   Dr. Elsa Halls   COLONOSCOPY WITH PROPOFOL  N/A 05/05/2022   Procedure: COLONOSCOPY WITH PROPOFOL ;  Surgeon: Marnee Sink, MD;  Location: [facility] ENDOSCOPY;  Service: Endoscopy;  Laterality: N/A;   ESOPHAGOGASTRODUODENOSCOPY (EGD) WITH PROPOFOL  N/A 12/05/2020   Procedure: ESOPHAGOGASTRODUODENOSCOPY (EGD) WITH PROPOFOL ;  Surgeon: Marnee Sink, MD;  Location: [facility] ENDOSCOPY;  Service: Endoscopy;  Laterality: N/A;   EYE SURGERY  Bilateral 2005   NASAL SEPTOPLASTY W/ TURBINOPLASTY Bilateral 12/24/2020   Procedure: NASAL SEPTOPLASTY WITH INFERIOR TURBINATE REDUCTION;  Surgeon: Rogers Clayman, MD;  Location: [facility] ORS;  Service: ENT;  Laterality: Bilateral;   UPPER GI ENDOSCOPY      Social History:  reports that he quit smoking about 40 years ago. His smoking use included cigarettes. He started smoking about 75 years ago. He has a 35 pack-year smoking history. He has never used smokeless tobacco. He reports that he does not currently use alcohol. He reports that he does not use drugs. BMI: Estimated body mass index is 25.73 kg/m as calculated from the following:   Height as of 11/10/23: 6\' 1"  (1.854 m).   Weight as of 11/10/23: 88.5 kg.  Lab Results  Component Value Date   ALBUMIN 4.5 08/27/2022   Diabetes: Patient does not have a diagnosis of diabetes.     Smoking Status:      Allergies:  Allergies  Allergen Reactions   Bee Pollen Anaphylaxis    Bee Venom   Bee Venom     Throat/mouth swelling    Medications: No current facility-administered medications for this encounter.   Current Outpatient Medications  Medication Sig Dispense Refill   albuterol  (PROVENTIL ) (2.5 MG/3ML) 0.083% nebulizer solution Take 3 mLs (2.5 mg total) by nebulization every 6 (six) hours as needed for wheezing or shortness of breath. 3 mL 0   Apoaequorin (PREVAGEN) 10 MG CAPS Take 1 capsule by mouth daily.  Ascorbic Acid (VITAMIN C) 1000 MG tablet Take 1,000 mg by mouth daily.     azithromycin  (ZITHROMAX ) 250 MG tablet Take 2 tablets on day 1, then 1 tablet daily on days 2 through 5 5 each 0   Cholecalciferol (VITAMIN D3) 125 MCG (5000 UT) CAPS Take by mouth.     clotrimazole -betamethasone  (LOTRISONE ) cream Apply 1 Application topically 2 (two) times daily as needed.     cyclobenzaprine  (FLEXERIL ) 10 MG tablet TAKE 1 TABLET BY MOUTH TWICE  DAILY AS NEEDED FOR MUSCLE  SPASM(S) 60 tablet 3   escitalopram  (LEXAPRO ) 20 MG tablet  Take 0.5 tablets (10 mg total) by mouth daily.     finasteride  (PROSCAR ) 5 MG tablet TAKE 1 TABLET EVERY DAY 90 tablet 3   fluticasone  (FLONASE ) 50 MCG/ACT nasal spray USE 1 SPRAY IN BOTH NOSTRILS DAILY 32 g 3   losartan  (COZAAR ) 100 MG tablet Take 1 tablet (100 mg total) by mouth daily. 90 tablet 1   melatonin 5 MG TABS Take 5 mg by mouth at bedtime.      Methylsulfonylmethane (MSM) 1000 MG CAPS Take 1 capsule by mouth 2 (two) times daily.     Multiple Vitamins-Minerals (MULTIVITAMIN MEN 50+) TABS Take 1 tablet by mouth daily.     OVER THE COUNTER MEDICATION Take 1 tablet by mouth as needed. Boosted Pro Dietary Supplement for ED (gummy formulation)     pantoprazole  (PROTONIX ) 40 MG tablet TAKE 1 TABLET EVERY DAY 90 tablet 1   selenium 200 MCG TABS tablet Take by mouth daily.     simvastatin  (ZOCOR ) 40 MG tablet Take 1 tablet (40 mg total) by mouth daily. 90 tablet 1   tadalafil  (CIALIS ) 20 MG tablet Take 1 tablet (20 mg total) by mouth daily as needed for erectile dysfunction. 30 tablet 1   tamsulosin  (FLOMAX ) 0.4 MG CAPS capsule TAKE 1 CAPSULE EVERY DAY 90 capsule 3   temazepam  (RESTORIL ) 15 MG capsule TAKE 1 CAPSULE AT BEDTIME AS NEEDED FOR SLEEP 90 capsule 1   Testosterone  2 MG/24HR PT24 Place 1 patch onto the skin as needed.     Tiotropium Bromide Monohydrate  (SPIRIVA  RESPIMAT) 2.5 MCG/ACT AERS Inhale 2 puffs into the lungs daily. 12 g 3   triamcinolone  cream (KENALOG ) 0.1 % APPLY  CREAM EXTERNALLY TO AFFECTED AREA TWICE DAILY 80 g 1   vitamin B-12 (CYANOCOBALAMIN) 500 MCG tablet Take 500 mcg by mouth daily.      No results found for this or any previous visit (from the past 48 hours). No results found.  ROS: Pain with rom of the right lower extremity  Physical Exam: Alert and oriented 78 y.o. male in no acute distress Cranial nerves 2-12 intact Cervical spine: full rom with no tenderness, nv intact distally Chest: active breath sounds bilaterally, no wheeze rhonchi or  rales Heart: regular rate and rhythm, no murmur Abd: non tender non distended with active bowel sounds Hip is stable with rom  Right knee painful rom with crepitus Nv intact distally Antalgic gait  Assessment/Plan Assessment: right knee end stage osteoarthritis  Plan:  Patient will undergo a right total knee by Dr. Brunilda Capra at [facility] Risks benefits and expectations were discussed with the patient. Patient understand risks, benefits and expectations and wishes to proceed. Preoperative templating of the joint replacement has been completed, documented, and submitted to the Operating Room personnel in order to optimize intra-operative equipment management.   Brad Ellianne Gowen PA-C, MPAS [employer] [facility] now [employer]  [place] [address]  [address], [place], [place] [address] Phone: [phone] [email or web address] Facebook  [employer]

## 2023-12-08 DIAGNOSIS — M6281 Muscle weakness (generalized): Secondary | ICD-10-CM | POA: Diagnosis not present

## 2023-12-08 DIAGNOSIS — R2689 Other abnormalities of gait and mobility: Secondary | ICD-10-CM | POA: Diagnosis not present

## 2023-12-08 DIAGNOSIS — M1711 Unilateral primary osteoarthritis, right knee: Secondary | ICD-10-CM | POA: Diagnosis not present

## 2023-12-13 ENCOUNTER — Encounter (HOSPITAL_COMMUNITY)
Admission: RE | Admit: 2023-12-13 | Discharge: 2023-12-13 | Disposition: A | Discharge status: Home / Self Care | Source: Ambulatory Visit | Attending: Orthopedic Surgery | Admitting: Orthopedic Surgery

## 2023-12-13 ENCOUNTER — Other Ambulatory Visit: Payer: Self-pay

## 2023-12-13 ENCOUNTER — Encounter (HOSPITAL_COMMUNITY): Payer: Self-pay

## 2023-12-13 VITALS — BP 130/83 | HR 70 | Temp 97.6°F | Resp 14 | Ht 72.0 in | Wt 197.0 lb

## 2023-12-13 DIAGNOSIS — K219 Gastro-esophageal reflux disease without esophagitis: Secondary | ICD-10-CM | POA: Insufficient documentation

## 2023-12-13 DIAGNOSIS — M1711 Unilateral primary osteoarthritis, right knee: Secondary | ICD-10-CM | POA: Diagnosis not present

## 2023-12-13 DIAGNOSIS — G4733 Obstructive sleep apnea (adult) (pediatric): Secondary | ICD-10-CM | POA: Diagnosis not present

## 2023-12-13 DIAGNOSIS — Z87891 Personal history of nicotine dependence: Secondary | ICD-10-CM | POA: Diagnosis not present

## 2023-12-13 DIAGNOSIS — Z79899 Other long term (current) drug therapy: Secondary | ICD-10-CM | POA: Diagnosis not present

## 2023-12-13 DIAGNOSIS — Z01812 Encounter for preprocedural laboratory examination: Secondary | ICD-10-CM | POA: Insufficient documentation

## 2023-12-13 DIAGNOSIS — J440 Chronic obstructive pulmonary disease with acute lower respiratory infection: Secondary | ICD-10-CM | POA: Insufficient documentation

## 2023-12-13 DIAGNOSIS — I10 Essential (primary) hypertension: Secondary | ICD-10-CM | POA: Insufficient documentation

## 2023-12-13 DIAGNOSIS — Z01818 Encounter for other preprocedural examination: Secondary | ICD-10-CM

## 2023-12-13 HISTORY — DX: Cardiac arrhythmia, unspecified: I49.9

## 2023-12-13 HISTORY — DX: Essential (primary) hypertension: I10

## 2023-12-13 HISTORY — DX: Sleep apnea, unspecified: G47.30

## 2023-12-13 LAB — CBC
HCT: 44.6 % (ref 39.0–52.0)
Hemoglobin: 15 g/dL (ref 13.0–17.0)
MCH: 32.1 pg (ref 26.0–34.0)
MCHC: 33.6 g/dL (ref 30.0–36.0)
MCV: 95.5 fL (ref 80.0–100.0)
Platelets: 159 10*3/uL (ref 150–400)
RBC: 4.67 MIL/uL (ref 4.22–5.81)
RDW: 13.7 % (ref 11.5–15.5)
WBC: 5.9 10*3/uL (ref 4.0–10.5)
nRBC: 0 % (ref 0.0–0.2)

## 2023-12-13 LAB — BASIC METABOLIC PANEL WITH GFR
Anion gap: 7 (ref 5–15)
BUN: 15 mg/dL (ref 8–23)
CO2: 22 mmol/L (ref 22–32)
Calcium: 9.3 mg/dL (ref 8.9–10.3)
Chloride: 108 mmol/L (ref 98–111)
Creatinine, Ser: 0.85 mg/dL (ref 0.61–1.24)
GFR, Estimated: >60 mL/min (ref >60)
Glucose, Bld: 85 mg/dL (ref 70–99)
Potassium: 4.2 mmol/L (ref 3.5–5.1)
Sodium: 137 mmol/L (ref 135–145)

## 2023-12-13 LAB — SURGICAL PCR SCREEN
MRSA, PCR: NEGATIVE
Staphylococcus aureus: NEGATIVE

## 2023-12-14 NOTE — Progress Notes (Signed)
 Case: [id number] Date/Time: 12/17/23 1140   Procedure: ARTHROPLASTY, KNEE, TOTAL (Right: Knee)   Anesthesia type: Spinal   Pre-op diagnosis: Right knee osteoarthritis   Location: [facility] ROOM 07 / [facility]   Surgeons: Winston Hawking, MD       DISCUSSION: Joshua Mercado is an 78 year old male who presents to PAT prior to surgery above.  Past medical history significant for former smoking, HTN, COPD, moderate OSA (intolerant to CPAP), GERD, arthritis  Patient follows with PCP.  Last seen on 11/10/2023 for an acute visit due to COPD exacerbation with increased cough.  He was treated with a Z-Pak and prednisone  as well as inhalers.  Chest x-ray on 5/1 was negative.  Medical clearance (scanned in media on 09/02/2023) states patient is cleared but "needs to get blood pressure under control but currently titrating medications."  Cardiac clearance recommended.  Anticipate patient can proceed as long as respiratory status is stable/improved on day of surgery since he is greater than 4 weeks since diagnosis  Patient seen by cardiology on 09/02/2023 for help managing his blood pressure medication.  They advised him to continue to keep a log and increase his losartan  if it remains elevated.  He was cleared for surgery:  "Joshua Mercado's perioperative risk of a major cardiac event is 0.4% according to the Revised Cardiac Risk Index (RCRI).  Therefore, he is at low risk for perioperative complications.   His functional capacity is fair at 4.64 METs according to the Duke Activity Status Index (DASI). Recommendations: According to ACC/AHA guidelines, no further cardiovascular testing needed.  The patient may proceed to surgery at acceptable risk."   VS: BP 130/83   Pulse 70   Temp 36.4 C (Oral)   Resp 14   Ht 6' (1.829 m)   Wt 89.4 kg   SpO2 95%   BMI 26.72 kg/m   PROVIDERS: PCP - Gilbert Lab, NP Cardiologist - Constancia Delton, MD Pulmonologist- Roena Clark, PA  LABS: Labs reviewed: Acceptable for  surgery. (all labs ordered are listed, but only abnormal results are displayed)  Labs Reviewed  SURGICAL PCR SCREEN  BASIC METABOLIC PANEL WITH GFR  CBC     IMAGES: Chest x-ray 11/11/2023:  FINDINGS: The heart size and mediastinal contours are stable with aortic atherosclerosis. Lower lung volumes with slightly increased streaky opacity at the left lung base, favoring atelectasis or scarring. No confluent airspace disease, pneumothorax or significant pleural effusion. Stable mild degenerative changes in the spine without acute osseous abnormality.   IMPRESSION: Lower lung volumes with slightly increased streaky opacity at the left lung base, favoring atelectasis or scarring. No definite evidence of pneumonia.  EKG 09/02/2023  Normal sinus rhythm, rate 62  CV: Echo 05/28/2023  IMPRESSIONS    1. Left ventricular ejection fraction, by estimation, is 50 to 55%. Left ventricular ejection fraction by 2D MOD biplane is 50.9 %. The left ventricle has low normal function. The left ventricle has no regional wall motion abnormalities. There is mild left ventricular hypertrophy. Left ventricular diastolic parameters are consistent with Grade I diastolic dysfunction (impaired relaxation).  2. Right ventricular systolic function is normal. The right ventricular size is normal.  3. The mitral valve is normal in structure. No evidence of mitral valve regurgitation.  4. The aortic valve is tricuspid. Aortic valve regurgitation is mild.  5. Aortic dilatation noted. There is mild dilatation of the aortic root, measuring 40 mm.  6. The inferior vena cava is normal in size with <50% respiratory variability, suggesting right atrial  pressure of 8 mmHg.  Past Medical History:  Diagnosis Date   Arthritis    Knees   COPD (chronic obstructive pulmonary disease) (HCC)    Does use hearing aid    Bilateral   Dysrhythmia    Enlarged prostate    GERD (gastroesophageal reflux disease)     Hypercholesterolemia    Hypertension    Sleep apnea     Past Surgical History:  Procedure Laterality Date   CHEEK AUGMENTATION Left 1970   REstructure of cheek bone    COLONOSCOPY  2017   Dr. Elsa Halls   COLONOSCOPY WITH PROPOFOL  N/A 05/05/2022   Procedure: COLONOSCOPY WITH PROPOFOL ;  Surgeon: Marnee Sink, MD;  Location: [facility];  Service: Endoscopy;  Laterality: N/A;   ESOPHAGOGASTRODUODENOSCOPY (EGD) WITH PROPOFOL  N/A 12/05/2020   Procedure: ESOPHAGOGASTRODUODENOSCOPY (EGD) WITH PROPOFOL ;  Surgeon: Marnee Sink, MD;  Location: [facility];  Service: Endoscopy;  Laterality: N/A;   EYE SURGERY Bilateral 2005   NASAL SEPTOPLASTY W/ TURBINOPLASTY Bilateral 12/24/2020   Procedure: NASAL SEPTOPLASTY WITH INFERIOR TURBINATE REDUCTION;  Surgeon: Rogers Clayman, MD;  Location: [facility] OR[facility];  Service: ENT;  Laterality: Bilateral;   UPPER GI ENDOSCOPY      MEDICATIONS:  metoprolol  succinate (TOPROL -XL) 25 MG 24 hr tablet   albuterol  (PROVENTIL ) (2.5 MG/3ML) 0.083% nebulizer solution   Apoaequorin (PREVAGEN) 10 MG CAPS   Ascorbic Acid (VITAMIN C) 1000 MG tablet   Cholecalciferol (VITAMIN D3) 125 MCG (5000 UT) CAPS   clotrimazole -betamethasone  (LOTRISONE ) cream   cyclobenzaprine  (FLEXERIL ) 10 MG tablet   escitalopram  (LEXAPRO ) 20 MG tablet   finasteride  (PROSCAR ) 5 MG tablet   fluticasone  (FLONASE ) 50 MCG/ACT nasal spray   losartan  (COZAAR ) 100 MG tablet   melatonin 5 MG TABS   Methylsulfonylmethane (MSM) 1000 MG CAPS   Multiple Vitamins-Minerals (MULTIVITAMIN MEN 50+) TABS   OVER THE COUNTER MEDICATION   pantoprazole  (PROTONIX ) 40 MG tablet   selenium 200 MCG TABS tablet   simvastatin  (ZOCOR ) 40 MG tablet   tadalafil  (CIALIS ) 20 MG tablet   tamsulosin  (FLOMAX ) 0.4 MG CAPS capsule   temazepam  (RESTORIL ) 15 MG capsule   Testosterone  2 MG/24HR PT24   Tiotropium Bromide Monohydrate  (SPIRIVA  RESPIMAT) 2.5 MCG/ACT AERS   triamcinolone  cream (KENALOG ) 0.1 %   vitamin B-12  (CYANOCOBALAMIN) 500 MCG tablet   No current facility-administered medications for this encounter.   Antoinette Kirschner [facility] Short Stay/Anesthesiology [facility] Phone [phone] 12/14/2023 11:30 AM

## 2023-12-14 NOTE — Anesthesia Preprocedure Evaluation (Signed)
 Anesthesia Evaluation  Patient identified by MRN, date of birth, ID band Patient awake    Reviewed: Allergy & Precautions, NPO status , Patient's Chart, lab work & pertinent test results, reviewed documented beta blocker date and time   History of Anesthesia Complications Negative for: history of anesthetic complications  Airway Mallampati: II  TM Distance: >3 FB Neck ROM: Full    Dental  (+) Dental Advisory Given   Pulmonary sleep apnea , COPD,  COPD inhaler, former smoker   breath sounds clear to auscultation       Cardiovascular hypertension, Pt. on medications and Pt. on home beta blockers  Rhythm:Regular Rate:Normal  05/2023 ECHO: EF 50 to 55%. 1. LV EF 50.9 %. The left ventricle has low normal function, no regional wall motion abnormalities. There is mild LVH. Grade I diastolic dysfunction (impaired relaxation).   2. RVF is normal. The right ventricular size is normal.   3. The mitral valve is normal in structure. No evidence of mitral valve regurgitation.   4. The aortic valve is tricuspid. Aortic valve regurgitation is mild.     Neuro/Psych    Depression       GI/Hepatic Neg liver ROS,GERD  Medicated and Controlled,,  Endo/Other  negative endocrine ROS    Renal/GU negative Renal ROS     Musculoskeletal  (+) Arthritis ,    Abdominal   Peds  Hematology Hb 15.0, plt 159k   Anesthesia Other Findings   Reproductive/Obstetrics                             Anesthesia Physical Anesthesia Plan  ASA: 3  Anesthesia Plan: Spinal   Post-op Pain Management: Tylenol  PO (pre-op)* and Regional block*   Induction:   PONV Risk Score and Plan: 1 and Treatment may vary due to age or medical condition  Airway Management Planned: Natural Airway and Simple Face Mask  Additional Equipment: None  Intra-op Plan:   Post-operative Plan:   Informed Consent: I have reviewed the patients History  and Physical, chart, labs and discussed the procedure including the risks, benefits and alternatives for the proposed anesthesia with the patient or authorized representative who has indicated his/her understanding and acceptance.     Dental advisory given  Plan Discussed with: CRNA and Surgeon  Anesthesia Plan Comments: (See PAT note from 6/2 Plan routine monitors, SAB with adductor canal block for post op analgesia)        Anesthesia Quick Evaluation

## 2023-12-15 ENCOUNTER — Encounter: Admitting: Student

## 2023-12-17 ENCOUNTER — Ambulatory Visit (HOSPITAL_COMMUNITY): Payer: Self-pay | Admitting: Medical

## 2023-12-17 ENCOUNTER — Other Ambulatory Visit: Payer: Self-pay

## 2023-12-17 ENCOUNTER — Observation Stay (HOSPITAL_COMMUNITY)
Admission: RE | Admit: 2023-12-17 | Discharge: 2023-12-20 | Disposition: A | Discharge status: Home / Self Care | Payer: Medicare HMO | Attending: Orthopedic Surgery | Admitting: Orthopedic Surgery

## 2023-12-17 ENCOUNTER — Ambulatory Visit (HOSPITAL_COMMUNITY): Admitting: Anesthesiology

## 2023-12-17 ENCOUNTER — Telehealth: Payer: Self-pay | Admitting: *Deleted

## 2023-12-17 ENCOUNTER — Encounter: Admitting: Student

## 2023-12-17 ENCOUNTER — Encounter (HOSPITAL_COMMUNITY)
Admission: RE | Disposition: A | Discharge status: Home / Self Care | Payer: Self-pay | Source: Home / Self Care | Attending: Orthopedic Surgery

## 2023-12-17 ENCOUNTER — Encounter (HOSPITAL_COMMUNITY): Payer: Self-pay | Admitting: Orthopedic Surgery

## 2023-12-17 DIAGNOSIS — J449 Chronic obstructive pulmonary disease, unspecified: Secondary | ICD-10-CM | POA: Diagnosis not present

## 2023-12-17 DIAGNOSIS — Z79899 Other long term (current) drug therapy: Secondary | ICD-10-CM | POA: Insufficient documentation

## 2023-12-17 DIAGNOSIS — Z96651 Presence of right artificial knee joint: Principal | ICD-10-CM

## 2023-12-17 DIAGNOSIS — M1711 Unilateral primary osteoarthritis, right knee: Secondary | ICD-10-CM | POA: Diagnosis not present

## 2023-12-17 DIAGNOSIS — I10 Essential (primary) hypertension: Secondary | ICD-10-CM

## 2023-12-17 DIAGNOSIS — Z87891 Personal history of nicotine dependence: Secondary | ICD-10-CM | POA: Insufficient documentation

## 2023-12-17 DIAGNOSIS — I493 Ventricular premature depolarization: Secondary | ICD-10-CM | POA: Diagnosis not present

## 2023-12-17 DIAGNOSIS — G8918 Other acute postprocedural pain: Secondary | ICD-10-CM | POA: Diagnosis not present

## 2023-12-17 HISTORY — PX: TOTAL KNEE ARTHROPLASTY: SHX125

## 2023-12-17 SURGERY — ARTHROPLASTY, KNEE, TOTAL
Anesthesia: Spinal | Site: Knee | Laterality: Right

## 2023-12-17 MED ORDER — PHENYLEPHRINE HCL-NACL 20-0.9 MG/250ML-% IV SOLN
INTRAVENOUS | Status: DC | PRN
  Administered 2023-12-17: 120 ug via INTRAVENOUS
  Administered 2023-12-17: 20 ug/min via INTRAVENOUS

## 2023-12-17 MED ORDER — LOSARTAN POTASSIUM 50 MG PO TABS
100.0000 mg | ORAL_TABLET | Freq: Every day | ORAL | Status: DC
  Administered 2023-12-17 – 2023-12-20 (×4): 100 mg via ORAL
  Filled 2023-12-17 (×4): qty 2

## 2023-12-17 MED ORDER — VITAMIN C 500 MG PO TABS
1000.0000 mg | ORAL_TABLET | Freq: Every day | ORAL | Status: DC
  Administered 2023-12-17 – 2023-12-20 (×4): 1000 mg via ORAL
  Filled 2023-12-17 (×4): qty 2

## 2023-12-17 MED ORDER — DEXAMETHASONE SODIUM PHOSPHATE 10 MG/ML IJ SOLN
INTRAMUSCULAR | Status: DC | PRN
  Administered 2023-12-17: 8 mg via INTRAVENOUS

## 2023-12-17 MED ORDER — FLUTICASONE PROPIONATE 50 MCG/ACT NA SUSP
1.0000 | Freq: Every day | NASAL | Status: DC
  Administered 2023-12-18 – 2023-12-20 (×3): 1 via NASAL
  Filled 2023-12-17: qty 16

## 2023-12-17 MED ORDER — EPHEDRINE SULFATE-NACL 50-0.9 MG/10ML-% IV SOSY
PREFILLED_SYRINGE | INTRAVENOUS | Status: DC | PRN
  Administered 2023-12-17 (×2): 7.5 mg via INTRAVENOUS

## 2023-12-17 MED ORDER — FINASTERIDE 5 MG PO TABS
5.0000 mg | ORAL_TABLET | Freq: Every day | ORAL | Status: DC
  Administered 2023-12-17 – 2023-12-20 (×4): 5 mg via ORAL
  Filled 2023-12-17 (×4): qty 1

## 2023-12-17 MED ORDER — SODIUM CHLORIDE 0.9% FLUSH: 3.0000 mL | INTRAVENOUS | Status: DC | PRN

## 2023-12-17 MED ORDER — SODIUM CHLORIDE 0.9% FLUSH
3.0000 mL | Freq: Two times a day (BID) | INTRAVENOUS | Status: DC
  Administered 2023-12-18: 5 mL via INTRAVENOUS
  Administered 2023-12-18 – 2023-12-19 (×2): 10 mL via INTRAVENOUS
  Administered 2023-12-19 – 2023-12-20 (×2): 3 mL via INTRAVENOUS

## 2023-12-17 MED ORDER — OXYCODONE HCL 5 MG PO TABS
5.0000 mg | ORAL_TABLET | Freq: Once | ORAL | Status: AC | PRN
  Administered 2023-12-17: 5 mg via ORAL

## 2023-12-17 MED ORDER — HYDROMORPHONE HCL 1 MG/ML IJ SOLN: 0.2500 mg | INTRAMUSCULAR | Status: DC | PRN

## 2023-12-17 MED ORDER — METOCLOPRAMIDE HCL 5 MG/ML IJ SOLN: 5.0000 mg | Freq: Three times a day (TID) | INTRAMUSCULAR | Status: DC | PRN

## 2023-12-17 MED ORDER — APOAEQUORIN 10 MG PO CAPS
1.0000 | ORAL_CAPSULE | Freq: Every day | ORAL | Status: DC
  Administered 2023-12-19: 1 via ORAL

## 2023-12-17 MED ORDER — PHENYLEPHRINE HCL-NACL 20-0.9 MG/250ML-% IV SOLN
INTRAVENOUS | Status: AC
  Filled 2023-12-17: qty 250

## 2023-12-17 MED ORDER — MIDAZOLAM HCL 2 MG/2ML IJ SOLN: 0.5000 mg | Freq: Once | INTRAMUSCULAR | Status: DC | PRN

## 2023-12-17 MED ORDER — SODIUM CHLORIDE 0.9 % IR SOLN
Status: DC | PRN
Start: 2023-12-17 — End: 2023-12-17
  Administered 2023-12-17: 1000 mL

## 2023-12-17 MED ORDER — SIMVASTATIN 40 MG PO TABS
40.0000 mg | ORAL_TABLET | Freq: Every day | ORAL | Status: DC
  Administered 2023-12-18 – 2023-12-20 (×3): 40 mg via ORAL
  Filled 2023-12-17 (×3): qty 1

## 2023-12-17 MED ORDER — MIDAZOLAM HCL 2 MG/2ML IJ SOLN
1.0000 mg | INTRAMUSCULAR | Status: DC
  Administered 2023-12-17: 1 mg via INTRAVENOUS
  Filled 2023-12-17: qty 2

## 2023-12-17 MED ORDER — ADULT MULTIVITAMIN W/MINERALS CH
1.0000 | ORAL_TABLET | Freq: Every day | ORAL | Status: DC
  Administered 2023-12-17 – 2023-12-20 (×4): 1 via ORAL
  Filled 2023-12-17 (×4): qty 1

## 2023-12-17 MED ORDER — TRANEXAMIC ACID-NACL 1000-0.7 MG/100ML-% IV SOLN
1000.0000 mg | INTRAVENOUS | Status: AC
  Administered 2023-12-17: 1000 mg via INTRAVENOUS
  Filled 2023-12-17: qty 100

## 2023-12-17 MED ORDER — ONDANSETRON HCL 4 MG/2ML IJ SOLN
4.0000 mg | Freq: Four times a day (QID) | INTRAMUSCULAR | Status: DC | PRN
  Administered 2023-12-17 – 2023-12-18 (×3): 4 mg via INTRAVENOUS
  Filled 2023-12-17 (×3): qty 2

## 2023-12-17 MED ORDER — BUPIVACAINE-EPINEPHRINE (PF) 0.25% -1:200000 IJ SOLN
INTRAMUSCULAR | Status: AC
  Filled 2023-12-17: qty 30

## 2023-12-17 MED ORDER — ASPIRIN 81 MG PO CHEW
81.0000 mg | CHEWABLE_TABLET | Freq: Two times a day (BID) | ORAL | Status: DC
  Administered 2023-12-17 – 2023-12-20 (×6): 81 mg via ORAL
  Filled 2023-12-17 (×6): qty 1

## 2023-12-17 MED ORDER — CEFAZOLIN SODIUM-DEXTROSE 2-4 GM/100ML-% IV SOLN
2.0000 g | Freq: Four times a day (QID) | INTRAVENOUS | Status: AC
  Administered 2023-12-17 – 2023-12-18 (×2): 2 g via INTRAVENOUS
  Filled 2023-12-17 (×2): qty 100

## 2023-12-17 MED ORDER — FENTANYL CITRATE PF 50 MCG/ML IJ SOSY
50.0000 ug | PREFILLED_SYRINGE | INTRAMUSCULAR | Status: DC
  Administered 2023-12-17: 50 ug via INTRAVENOUS
  Filled 2023-12-17: qty 2

## 2023-12-17 MED ORDER — TRANEXAMIC ACID-NACL 1000-0.7 MG/100ML-% IV SOLN
1000.0000 mg | Freq: Once | INTRAVENOUS | Status: AC
  Administered 2023-12-17: 1000 mg via INTRAVENOUS
  Filled 2023-12-17: qty 100

## 2023-12-17 MED ORDER — PHENOL 1.4 % MT LIQD: 1.0000 | OROMUCOSAL | Status: DC | PRN

## 2023-12-17 MED ORDER — BUPIVACAINE LIPOSOME 1.3 % IJ SUSP
INTRAMUSCULAR | Status: DC | PRN
  Administered 2023-12-17: 20 mL

## 2023-12-17 MED ORDER — HYDROMORPHONE HCL 1 MG/ML IJ SOLN
INTRAMUSCULAR | Status: AC
  Filled 2023-12-17: qty 1

## 2023-12-17 MED ORDER — OXYCODONE HCL 5 MG PO TABS
ORAL_TABLET | ORAL | Status: AC
  Filled 2023-12-17: qty 1

## 2023-12-17 MED ORDER — LACTATED RINGERS IV SOLN: INTRAVENOUS | Status: DC

## 2023-12-17 MED ORDER — ACETAMINOPHEN 500 MG PO TABS
1000.0000 mg | ORAL_TABLET | Freq: Once | ORAL | Status: AC
  Administered 2023-12-17: 1000 mg via ORAL
  Filled 2023-12-17: qty 2

## 2023-12-17 MED ORDER — TESTOSTERONE 2 MG/24HR TD PT24: 1.0000 | MEDICATED_PATCH | TRANSDERMAL | Status: DC | PRN

## 2023-12-17 MED ORDER — ESCITALOPRAM OXALATE 10 MG PO TABS
10.0000 mg | ORAL_TABLET | Freq: Every day | ORAL | Status: DC
  Administered 2023-12-18 – 2023-12-20 (×3): 10 mg via ORAL
  Filled 2023-12-17 (×3): qty 1

## 2023-12-17 MED ORDER — TADALAFIL 20 MG PO TABS: 20.0000 mg | ORAL_TABLET | Freq: Every day | ORAL | Status: DC | PRN

## 2023-12-17 MED ORDER — ONDANSETRON HCL 4 MG PO TABS: 4.0000 mg | ORAL_TABLET | Freq: Three times a day (TID) | ORAL | 1 refills | Status: DC | PRN

## 2023-12-17 MED ORDER — VITAMIN B-12 1000 MCG PO TABS
500.0000 ug | ORAL_TABLET | Freq: Every day | ORAL | Status: DC
  Administered 2023-12-17 – 2023-12-20 (×4): 500 ug via ORAL
  Filled 2023-12-17 (×4): qty 1

## 2023-12-17 MED ORDER — METOCLOPRAMIDE HCL 5 MG PO TABS: 5.0000 mg | ORAL_TABLET | Freq: Three times a day (TID) | ORAL | Status: DC | PRN

## 2023-12-17 MED ORDER — CHLORHEXIDINE GLUCONATE 0.12 % MT SOLN
15.0000 mL | Freq: Once | OROMUCOSAL | Status: AC
  Administered 2023-12-17: 15 mL via OROMUCOSAL

## 2023-12-17 MED ORDER — CLOTRIMAZOLE 1 % EX CREA
TOPICAL_CREAM | Freq: Two times a day (BID) | CUTANEOUS | Status: DC
  Filled 2023-12-17: qty 15

## 2023-12-17 MED ORDER — BUPIVACAINE-EPINEPHRINE 0.25% -1:200000 IJ SOLN
INTRAMUSCULAR | Status: DC | PRN
  Administered 2023-12-17: 30 mL

## 2023-12-17 MED ORDER — PROPOFOL 500 MG/50ML IV EMUL
INTRAVENOUS | Status: DC | PRN
  Administered 2023-12-17: 30 ug/kg/min via INTRAVENOUS

## 2023-12-17 MED ORDER — MEPERIDINE HCL 50 MG/ML IJ SOLN: 6.2500 mg | INTRAMUSCULAR | Status: DC | PRN

## 2023-12-17 MED ORDER — METOPROLOL SUCCINATE ER 25 MG PO TB24
25.0000 mg | ORAL_TABLET | Freq: Every day | ORAL | Status: DC
  Administered 2023-12-18 – 2023-12-20 (×3): 25 mg via ORAL
  Filled 2023-12-17 (×3): qty 1

## 2023-12-17 MED ORDER — ALBUTEROL SULFATE (2.5 MG/3ML) 0.083% IN NEBU: 2.5000 mg | INHALATION_SOLUTION | Freq: Four times a day (QID) | RESPIRATORY_TRACT | Status: DC | PRN

## 2023-12-17 MED ORDER — TRIAMCINOLONE ACETONIDE 0.1 % EX CREA
TOPICAL_CREAM | Freq: Two times a day (BID) | CUTANEOUS | Status: DC
  Filled 2023-12-17: qty 15

## 2023-12-17 MED ORDER — OXYCODONE-ACETAMINOPHEN 5-325 MG PO TABS: 1.0000 | ORAL_TABLET | ORAL | 0 refills | Status: DC | PRN

## 2023-12-17 MED ORDER — CEFAZOLIN SODIUM-DEXTROSE 2-4 GM/100ML-% IV SOLN
2.0000 g | INTRAVENOUS | Status: AC
  Administered 2023-12-17: 2 g via INTRAVENOUS
  Filled 2023-12-17: qty 100

## 2023-12-17 MED ORDER — ACETAMINOPHEN 325 MG PO TABS
325.0000 mg | ORAL_TABLET | Freq: Four times a day (QID) | ORAL | Status: DC | PRN
  Administered 2023-12-19: 650 mg via ORAL
  Filled 2023-12-17: qty 2

## 2023-12-17 MED ORDER — BUPIVACAINE LIPOSOME 1.3 % IJ SUSP
INTRAMUSCULAR | Status: AC
  Filled 2023-12-17: qty 20

## 2023-12-17 MED ORDER — HYDROMORPHONE HCL 1 MG/ML IJ SOLN
0.5000 mg | INTRAMUSCULAR | Status: DC | PRN
  Administered 2023-12-18: 1 mg via INTRAVENOUS
  Filled 2023-12-17: qty 1

## 2023-12-17 MED ORDER — SELENIUM 200 MCG PO TABS: 200.0000 ug | ORAL_TABLET | Freq: Every day | ORAL | Status: DC

## 2023-12-17 MED ORDER — ROPIVACAINE HCL 7.5 MG/ML IJ SOLN
INTRAMUSCULAR | Status: DC | PRN
  Administered 2023-12-17: 20 mL via PERINEURAL

## 2023-12-17 MED ORDER — POLYETHYLENE GLYCOL 3350 17 G PO PACK
17.0000 g | PACK | Freq: Every day | ORAL | Status: DC | PRN
  Administered 2023-12-20: 17 g via ORAL
  Filled 2023-12-17: qty 1

## 2023-12-17 MED ORDER — ASPIRIN 81 MG PO CHEW: 81.0000 mg | CHEWABLE_TABLET | Freq: Two times a day (BID) | ORAL | 0 refills | Status: AC

## 2023-12-17 MED ORDER — BUPIVACAINE IN DEXTROSE 0.75-8.25 % IT SOLN
INTRATHECAL | Status: DC | PRN
  Administered 2023-12-17: 13.5 mg via INTRATHECAL

## 2023-12-17 MED ORDER — PANTOPRAZOLE SODIUM 40 MG PO TBEC
40.0000 mg | DELAYED_RELEASE_TABLET | Freq: Every day | ORAL | Status: DC
  Administered 2023-12-18 – 2023-12-20 (×3): 40 mg via ORAL
  Filled 2023-12-17 (×3): qty 1

## 2023-12-17 MED ORDER — CYCLOBENZAPRINE HCL 10 MG PO TABS
10.0000 mg | ORAL_TABLET | Freq: Three times a day (TID) | ORAL | Status: DC | PRN
  Administered 2023-12-17 – 2023-12-19 (×5): 10 mg via ORAL
  Filled 2023-12-17 (×5): qty 1

## 2023-12-17 MED ORDER — TIOTROPIUM BROMIDE MONOHYDRATE 2.5 MCG/ACT IN AERS: 2.0000 | INHALATION_SPRAY | Freq: Every day | RESPIRATORY_TRACT | Status: DC

## 2023-12-17 MED ORDER — ONDANSETRON HCL 4 MG/2ML IJ SOLN
INTRAMUSCULAR | Status: DC | PRN
  Administered 2023-12-17: 4 mg via INTRAVENOUS

## 2023-12-17 MED ORDER — ORAL CARE MOUTH RINSE: 15.0000 mL | Freq: Once | OROMUCOSAL | Status: AC

## 2023-12-17 MED ORDER — DOCUSATE SODIUM 100 MG PO CAPS
100.0000 mg | ORAL_CAPSULE | Freq: Two times a day (BID) | ORAL | Status: DC
  Administered 2023-12-17 – 2023-12-20 (×6): 100 mg via ORAL
  Filled 2023-12-17 (×6): qty 1

## 2023-12-17 MED ORDER — SODIUM CHLORIDE (PF) 0.9 % IJ SOLN
INTRAMUSCULAR | Status: AC
  Filled 2023-12-17: qty 30

## 2023-12-17 MED ORDER — WATER FOR IRRIGATION, STERILE IR SOLN
Status: DC | PRN
  Administered 2023-12-17: 2000 mL

## 2023-12-17 MED ORDER — SODIUM CHLORIDE (PF) 0.9 % IJ SOLN
INTRAMUSCULAR | Status: DC | PRN
  Administered 2023-12-17: 30 mL

## 2023-12-17 MED ORDER — UMECLIDINIUM BROMIDE 62.5 MCG/ACT IN AEPB
1.0000 | INHALATION_SPRAY | Freq: Every day | RESPIRATORY_TRACT | Status: DC
  Administered 2023-12-18 – 2023-12-20 (×3): 1 via RESPIRATORY_TRACT
  Filled 2023-12-17: qty 7

## 2023-12-17 MED ORDER — TAMSULOSIN HCL 0.4 MG PO CAPS
0.4000 mg | ORAL_CAPSULE | Freq: Every day | ORAL | Status: DC
  Administered 2023-12-18 – 2023-12-20 (×3): 0.4 mg via ORAL
  Filled 2023-12-17 (×3): qty 1

## 2023-12-17 MED ORDER — OXYCODONE HCL 5 MG PO TABS
5.0000 mg | ORAL_TABLET | ORAL | Status: DC | PRN
  Administered 2023-12-17: 5 mg via ORAL
  Administered 2023-12-18: 10 mg via ORAL
  Administered 2023-12-18 (×2): 5 mg via ORAL
  Administered 2023-12-18 – 2023-12-19 (×2): 10 mg via ORAL
  Administered 2023-12-19: 5 mg via ORAL
  Administered 2023-12-19 – 2023-12-20 (×4): 10 mg via ORAL
  Filled 2023-12-17: qty 2
  Filled 2023-12-17: qty 1
  Filled 2023-12-17 (×3): qty 2
  Filled 2023-12-17: qty 1
  Filled 2023-12-17: qty 2
  Filled 2023-12-17: qty 1
  Filled 2023-12-17: qty 2
  Filled 2023-12-17: qty 1
  Filled 2023-12-17: qty 2

## 2023-12-17 MED ORDER — POVIDONE-IODINE 10 % EX SWAB
2.0000 | Freq: Once | CUTANEOUS | Status: AC
  Administered 2023-12-17: 2 via TOPICAL

## 2023-12-17 MED ORDER — 0.9 % SODIUM CHLORIDE (POUR BTL) OPTIME
TOPICAL | Status: DC | PRN
Start: 2023-12-17 — End: 2023-12-17
  Administered 2023-12-17: 1000 mL

## 2023-12-17 MED ORDER — MENTHOL 3 MG MT LOZG: 1.0000 | LOZENGE | OROMUCOSAL | Status: DC | PRN

## 2023-12-17 MED ORDER — OXYCODONE HCL 5 MG/5ML PO SOLN: 5.0000 mg | Freq: Once | ORAL | Status: AC | PRN

## 2023-12-17 MED ORDER — MSM 1000 MG PO CAPS: 1.0000 | ORAL_CAPSULE | Freq: Two times a day (BID) | ORAL | Status: DC

## 2023-12-17 MED ORDER — ONDANSETRON HCL 4 MG PO TABS
4.0000 mg | ORAL_TABLET | Freq: Four times a day (QID) | ORAL | Status: DC | PRN
  Administered 2023-12-18 – 2023-12-19 (×3): 4 mg via ORAL
  Filled 2023-12-17 (×3): qty 1

## 2023-12-17 MED ORDER — BUPIVACAINE LIPOSOME 1.3 % IJ SUSP: 20.0000 mL | Freq: Once | INTRAMUSCULAR | Status: DC

## 2023-12-17 MED ORDER — MELATONIN 5 MG PO TABS
5.0000 mg | ORAL_TABLET | Freq: Every day | ORAL | Status: DC
  Administered 2023-12-17 – 2023-12-19 (×3): 5 mg via ORAL
  Filled 2023-12-17 (×3): qty 1

## 2023-12-17 SURGICAL SUPPLY — 45 items
ATTUNE MED DOME PAT 41 KNEE (Knees) IMPLANT
ATTUNE PS FEM RT SZ 8 CEM KNEE (Femur) IMPLANT
ATTUNE PSRP INSR SZ8 8 KNEE (Insert) IMPLANT
BAG COUNTER SPONGE SURGICOUNT (BAG) IMPLANT
BAG ZIPLOCK 12X15 (MISCELLANEOUS) ×1 IMPLANT
BASE TIBIAL ROT PLAT SZ 8 KNEE (Knees) IMPLANT
BLADE SAG 18X100X1.27 (BLADE) ×1 IMPLANT
BLADE SAW SGTL 13X75X1.27 (BLADE) ×1 IMPLANT
BNDG ELASTIC 6X10 VLCR STRL LF (GAUZE/BANDAGES/DRESSINGS) ×1 IMPLANT
BNDG GAUZE DERMACEA FLUFF 4 (GAUZE/BANDAGES/DRESSINGS) ×1 IMPLANT
BOWL SMART MIX CTS (DISPOSABLE) ×1 IMPLANT
CEMENT HV SMART SET (Cement) ×2 IMPLANT
COVER SURGICAL LIGHT HANDLE (MISCELLANEOUS) ×1 IMPLANT
CUFF TRNQT CYL 34X4.125X (TOURNIQUET CUFF) ×1 IMPLANT
DRAPE SHEET LG 3/4 BI-LAMINATE (DRAPES) ×1 IMPLANT
DRAPE U-SHAPE 47X51 STRL (DRAPES) ×1 IMPLANT
DRSG ADAPTIC 3X8 NADH LF (GAUZE/BANDAGES/DRESSINGS) ×1 IMPLANT
DURAPREP 26ML APPLICATOR (WOUND CARE) ×1 IMPLANT
ELECT PENCIL ROCKER SW 15FT (MISCELLANEOUS) ×1 IMPLANT
ELECT REM PT RETURN 15FT ADLT (MISCELLANEOUS) ×1 IMPLANT
GAUZE PAD ABD 8X10 STRL (GAUZE/BANDAGES/DRESSINGS) ×1 IMPLANT
GAUZE SPONGE 4X4 12PLY STRL (GAUZE/BANDAGES/DRESSINGS) ×1 IMPLANT
GLOVE BIOGEL PI IND STRL 7.5 (GLOVE) ×1 IMPLANT
GLOVE BIOGEL PI IND STRL 8.5 (GLOVE) ×1 IMPLANT
GLOVE ORTHO TXT STRL SZ7.5 (GLOVE) ×1 IMPLANT
GLOVE SURG ORTHO 8.5 STRL (GLOVE) ×1 IMPLANT
GOWN STRL REUS W/ TWL XL LVL3 (GOWN DISPOSABLE) ×2 IMPLANT
IMMOBILIZER KNEE 20 (SOFTGOODS) ×1 IMPLANT
IMMOBILIZER KNEE 20 THIGH 36 (SOFTGOODS) ×1 IMPLANT
KIT TURNOVER KIT A (KITS) ×1 IMPLANT
MANIFOLD NEPTUNE II (INSTRUMENTS) ×1 IMPLANT
NS IRRIG 1000ML POUR BTL (IV SOLUTION) ×1 IMPLANT
PACK TOTAL KNEE CUSTOM (KITS) ×1 IMPLANT
PIN STEINMAN FIXATION KNEE (PIN) IMPLANT
PROTECTOR NERVE ULNAR (MISCELLANEOUS) ×1 IMPLANT
SET HNDPC FAN SPRY TIP SCT (DISPOSABLE) ×1 IMPLANT
STRIP CLOSURE SKIN 1/2X4 (GAUZE/BANDAGES/DRESSINGS) ×2 IMPLANT
SUT MNCRL AB 3-0 PS2 18 (SUTURE) ×1 IMPLANT
SUT VIC AB 0 CT1 36 (SUTURE) ×1 IMPLANT
SUT VIC AB 1 CT1 36 (SUTURE) ×2 IMPLANT
SUT VIC AB 2-0 CT1 TAPERPNT 27 (SUTURE) ×1 IMPLANT
TOWEL GREEN STERILE FF (TOWEL DISPOSABLE) ×1 IMPLANT
TRAY CATH INTERMITTENT SS 16FR (CATHETERS) ×1 IMPLANT
WATER STERILE IRR 1000ML POUR (IV SOLUTION) ×2 IMPLANT
YANKAUER SUCT BULB TIP NO VENT (SUCTIONS) ×1 IMPLANT

## 2023-12-17 NOTE — Progress Notes (Signed)
 Orthopedic Tech Progress Note Patient Details:  Joshua Mercado 05/22/1946 [id number] CPM will be removed at 1930.  CPM Right Knee CPM Right Knee: On Right Knee Flexion (Degrees): 90 Right Knee Extension (Degrees): 0  Post Interventions Patient Tolerated: Well Ortho Devices Type of Ortho Device: Bone foam zero knee Ortho Device/Splint Location: Right knee Ortho Device/Splint Interventions: Application   Post Interventions Patient Tolerated: Well  Mearl Spice Le Faulcon 12/17/2023, 3:30 PM

## 2023-12-17 NOTE — Brief Op Note (Signed)
 12/17/2023  2:48 PM  PATIENT:  Joshua Mercado  78 y.o. male  PRE-OPERATIVE DIAGNOSIS:  Right knee osteoarthritis, end stage  POST-OPERATIVE DIAGNOSIS:  Right knee osteoarthritis, end stage  PROCEDURE:  Procedure(s): ARTHROPLASTY, KNEE, TOTAL (Right) DePuy Attune   SURGEON:  Surgeons and Role:    Winston Hawking, MD - Primary  PHYSICIAN ASSISTANT:   ASSISTANTS: Corinthia Dickinson, PA-C   ANESTHESIA:   regional and spinal  EBL:  minimal  BLOOD ADMINISTERED:none  DRAINS: none   LOCAL MEDICATIONS USED:  MARCAINE     SPECIMEN:  No Specimen  DISPOSITION OF SPECIMEN:  N/A  COUNTS:  YES  TOURNIQUET:   Total Tourniquet Time Documented: Thigh (Right) - 75 minutes Total: Thigh (Right) - 75 minutes   DICTATION: .Other Dictation: Dictation Number [id number]  PLAN OF CARE: Admit for overnight observation  PATIENT DISPOSITION:  PACU - hemodynamically stable.   Delay start of Pharmacological VTE agent (>24hrs) due to surgical blood loss or risk of bleeding: no

## 2023-12-17 NOTE — Interval H&P Note (Signed)
 History and Physical Interval Note:  12/17/2023 11:01 AM  Joshua Mercado  has presented today for surgery, with the diagnosis of Right knee osteoarthritis.  The various methods of treatment have been discussed with the patient and family. After consideration of risks, benefits and other options for treatment, the patient has consented to  Procedure(s): ARTHROPLASTY, KNEE, TOTAL (Right) as a surgical intervention.  The patient's history has been reviewed, patient examined, no change in status, stable for surgery.  I have reviewed the patient's chart and labs.  Questions were answered to the patient's satisfaction.     Lorriane Rote

## 2023-12-17 NOTE — Telephone Encounter (Signed)
 Noted, thank you

## 2023-12-17 NOTE — Anesthesia Postprocedure Evaluation (Signed)
 Anesthesia Post Note  Patient: Joshua Mercado  Procedure(s) Performed: ARTHROPLASTY, KNEE, TOTAL (Right: Knee)     Patient location during evaluation: PACU Anesthesia Type: Spinal Level of consciousness: awake and alert, patient cooperative and oriented Pain management: pain level controlled Vital Signs Assessment: post-procedure vital signs reviewed and stable Respiratory status: spontaneous breathing, nonlabored ventilation and respiratory function stable Cardiovascular status: blood pressure returned to baseline and stable Postop Assessment: no apparent nausea or vomiting, patient able to bend at knees, spinal receding and adequate PO intake Anesthetic complications: no   No notable events documented.  Last Vitals:  Vitals:   12/17/23 1630 12/17/23 1700  BP: (!) 156/72 (!) 161/68  Pulse: 62 95  Resp: 17 16  Temp:  36.8 C  SpO2: 95% 96%    Last Pain:  Vitals:   12/17/23 1700  TempSrc:   PainSc: 3                  Victoria Henshaw,E. Sharone Picchi

## 2023-12-17 NOTE — Anesthesia Procedure Notes (Signed)
 Anesthesia Regional Block: Adductor canal block   Pre-Anesthetic Checklist: , timeout performed,  Correct Patient, Correct Site, Correct Laterality,  Correct Procedure, Correct Position, site marked,  Risks and benefits discussed,  Surgical consent,  Pre-op evaluation,  At surgeon's request and post-op pain management  Laterality: Right and Lower  Prep: chloraprep       Needles:  Injection technique: Single-shot  Needle Type: Echogenic Needle     Needle Length: 9cm  Needle Gauge: 21     Additional Needles:   Procedures:,,,, ultrasound used (permanent image in chart),,    Narrative:  Start time: 12/17/2023 11:59 AM End time: 12/17/2023 12:05 PM Injection made incrementally with aspirations every 5 mL.  Performed by: Personally  Anesthesiologist: Jonne Netters, MD  Additional Notes: Pt identified in Holding room.  Monitors applied. Working IV access confirmed. Timeout, Sterile prep R thigh.  #21ga ECHOgenic Arrow block needle into adductor canal with US  guidance.  20cc 0.75% Ropivacaine injected incrementally after negative test dose.  Patient asymptomatic, VSS, no heme aspirated, tolerated well.   Fay Hoop, MD

## 2023-12-17 NOTE — Anesthesia Procedure Notes (Signed)
 Spinal  End time: 12/17/2023 1:12 PM Reason for block: surgical anesthesia Staffing Performed: anesthesiologist  Anesthesiologist: Jonne Netters, MD Performed by: Jonne Netters, MD Authorized by: Jonne Netters, MD   Preanesthetic Checklist Completed: patient identified, IV checked, site marked, risks and benefits discussed, surgical consent, monitors and equipment checked, pre-op evaluation and timeout performed Spinal Block Patient position: sitting Prep: DuraPrep and site prepped and draped Patient monitoring: blood pressure, continuous pulse ox, cardiac monitor and heart rate Approach: midline Location: L2-3 Injection technique: single-shot Needle Needle type: Pencan and Introducer  Needle gauge: 24 G Needle length: 9 cm Assessment Events: CSF return Additional Notes Pt identified in Operating room.  Monitors applied. Working IV access confirmed. Sterile prep, drape lumbar spine.  1% lido local L 3,4. All attempts os, 1% lido local L2,3 and #24ga Pencan into clear CSF.  13.5mg  0.75% Bupivacaine with dextrose injected with asp CSF beginning and end of injection.  Patient asymptomatic, VSS, no heme aspirated, tolerated well.  Fay Hoop, MD

## 2023-12-17 NOTE — Care Plan (Signed)
 Ortho Bundle Case Management Note  Patient Details  Name: Joshua Mercado MRN: [id number] Date of Birth: 10/04/1945  RT TKA on 12/17/23  DCP: Home with sister and her husband DME: RW, ordered through [facility] PT: [place] 6/9                  DME Arranged:  Otho Blitz rolling DME Agency:  [employer]  HH Arranged:    HH Agency:     Additional Comments: Please contact me with any questions of if this plan should need to change.  Kathlene Paradise, Case Manager [facility] [phone]  Ext. [phone]   12/17/2023, 11:48 AM

## 2023-12-17 NOTE — Discharge Instructions (Signed)
 Ice to the knee constantly.  Keep the incision covered and clean and dry for one week, then ok to get it wet in the shower.  Do exercise as instructed every hour, please to prevent stiffness.    DO NOT prop anything under the knee, it will make your knee stiff.  Prop under the ankle to encourage your knee to go straight.   Use the walker while you are up and around for balance.  Wear your support stockings 24/7 to prevent blood clots and take baby aspirin twice daily for 30 days also to prevent blood clots  Follow up with Dr Brunilda Capra in two weeks in the office, call [phone] for appt  Please call Dr Brunilda Capra  cell [phone] with any questions or concerns  INSTRUCTIONS AFTER JOINT REPLACEMENT   Remove items at home which could result in a fall. This includes throw rugs or furniture in walking pathways ICE to the affected joint every three hours while awake for 30 minutes at a time, for at least the first 3-5 days, and then as needed for pain and swelling.  Continue to use ice for pain and swelling. You may notice swelling that will progress down to the foot and ankle.  This is normal after surgery.  Elevate your leg when you are not up walking on it.   Continue to use the breathing machine you got in the hospital (incentive spirometer) which will help keep your temperature down.  It is common for your temperature to cycle up and down following surgery, especially at night when you are not up moving around and exerting yourself.  The breathing machine keeps your lungs expanded and your temperature down.   DIET:  As you were doing prior to hospitalization, we recommend a well-balanced diet.  DRESSING / WOUND CARE / SHOWERING  Keep the Aquacel dressing on for one week and then remove and may get the incision wet  ACTIVITY  Increase activity slowly as tolerated, but follow the weight bearing instructions below.   No driving for 6 weeks or until further direction given by your physician.   You cannot drive while taking narcotics.  No lifting or carrying greater than 10 lbs. until further directed by your surgeon. Avoid periods of inactivity such as sitting longer than an hour when not asleep. This helps prevent blood clots.  You may return to work once you are authorized by your doctor.     WEIGHT BEARING   Weight bearing as tolerated with assist device (walker, cane, etc) as directed, use it as long as suggested by your surgeon or therapist, typically at least 4-6 weeks.   EXERCISES  Results after joint replacement surgery are often greatly improved when you follow the exercise, range of motion and muscle strengthening exercises prescribed by your doctor. Safety measures are also important to protect the joint from further injury. Any time any of these exercises cause you to have increased pain or swelling, decrease what you are doing until you are comfortable again and then slowly increase them. If you have problems or questions, call your caregiver or physical therapist for advice.   Rehabilitation is important following a joint replacement. After just a few days of immobilization, the muscles of the leg can become weakened and shrink (atrophy).  These exercises are designed to build up the tone and strength of the thigh and leg muscles and to improve motion. Often times heat used for twenty to thirty minutes before working out will loosen  up your tissues and help with improving the range of motion but do not use heat for the first two weeks following surgery (sometimes heat can increase post-operative swelling).   These exercises can be done on a training (exercise) mat, on the floor, on a table or on a bed. Use whatever works the best and is most comfortable for you.    Use music or television while you are exercising so that the exercises are a pleasant break in your day. This will make your life better with the exercises acting as a break in your routine that you can look  forward to.   Perform all exercises about fifteen times, three times per day or as directed.  You should exercise both the operative leg and the other leg as well.  Exercises include:   Quad Sets - Tighten up the muscle on the front of the thigh (Quad) and hold for 5-10 seconds.   Straight Leg Raises - With your knee straight (if you were given a brace, keep it on), lift the leg to 60 degrees, hold for 3 seconds, and slowly lower the leg.  Perform this exercise against resistance later as your leg gets stronger.  Leg Slides: Lying on your back, slowly slide your foot toward your buttocks, bending your knee up off the floor (only go as far as is comfortable). Then slowly slide your foot back down until your leg is flat on the floor again.  Angel Wings: Lying on your back spread your legs to the side as far apart as you can without causing discomfort.  Hamstring Strength:  Lying on your back, push your heel against the floor with your leg straight by tightening up the muscles of your buttocks.  Repeat, but this time bend your knee to a comfortable angle, and push your heel against the floor.  You may put a pillow under the heel to make it more comfortable if necessary.   A rehabilitation program following joint replacement surgery can speed recovery and prevent re-injury in the future due to weakened muscles. Contact your doctor or a physical therapist for more information on knee rehabilitation.    CONSTIPATION  Constipation is defined medically as fewer than three stools per week and severe constipation as less than one stool per week.  Even if you have a regular bowel pattern at home, your normal regimen is likely to be disrupted due to multiple reasons following surgery.  Combination of anesthesia, postoperative narcotics, change in appetite and fluid intake all can affect your bowels.   YOU MUST use at least one of the following options; they are listed in order of increasing strength to get the  job done.  They are all available over the counter, and you may need to use some, POSSIBLY even all of these options:    Drink plenty of fluids (prune juice may be helpful) and high fiber foods Colace 100 mg by mouth twice a day  Senokot for constipation as directed and as needed Dulcolax (bisacodyl), take with full glass of water  Miralax (polyethylene glycol) once or twice a day as needed.  If you have tried all these things and are unable to have a bowel movement in the first 3-4 days after surgery call either your surgeon or your primary doctor.    If you experience loose stools or diarrhea, hold the medications until you stool forms back up.  If your symptoms do not get better within 1 week or if they get  worse, check with your doctor.  If you experience "the worst abdominal pain ever" or develop nausea or vomiting, please contact the office immediately for further recommendations for treatment.   ITCHING:  If you experience itching with your medications, try taking only a single pain pill, or even half a pain pill at a time.  You can also use Benadryl over the counter for itching or also to help with sleep.   TED HOSE STOCKINGS:  Use stockings on both legs until for at least 2 weeks or as directed by physician office. They may be removed at night for sleeping.  MEDICATIONS:  See your medication summary on the "After Visit Summary" that nursing will review with you.  You may have some home medications which will be placed on hold until you complete the course of blood thinner medication.  It is important for you to complete the blood thinner medication as prescribed.  PRECAUTIONS:  If you experience chest pain or shortness of breath - call 911 immediately for transfer to the hospital emergency department.   If you develop a fever greater that 101 F, purulent drainage from wound, increased redness or drainage from wound, foul odor from the wound/dressing, or calf pain - CONTACT YOUR SURGEON.                                                    FOLLOW-UP APPOINTMENTS:  If you do not already have a post-op appointment, please call the office for an appointment to be seen by your surgeon.  Guidelines for how soon to be seen are listed in your "After Visit Summary", but are typically between 1-4 weeks after surgery.  OTHER INSTRUCTIONS:   Knee Replacement:  Do not place pillow under knee, focus on keeping the knee straight while resting. CPM instructions: 0-90 degrees, 2 hours in the morning, 2 hours in the afternoon, and 2 hours in the evening. Place foam block, curve side up under heel at all times except when in CPM or when walking.  DO NOT modify, tear, cut, or change the foam block in any way.  POST-OPERATIVE OPIOID TAPER INSTRUCTIONS: It is important to wean off of your opioid medication as soon as possible. If you do not need pain medication after your surgery it is ok to stop day one. Opioids include: Codeine, Hydrocodone(Norco, Vicodin), Oxycodone (Percocet, oxycontin ) and hydromorphone amongst others.  Long term and even short term use of opiods can cause: Increased pain response Dependence Constipation Depression Respiratory depression And more.  Withdrawal symptoms can include Flu like symptoms Nausea, vomiting And more Techniques to manage these symptoms Hydrate well Eat regular healthy meals Stay active Use relaxation techniques(deep breathing, meditating, yoga) Do Not substitute Alcohol to help with tapering If you have been on opioids for less than two weeks and do not have pain than it is ok to stop all together.  Plan to wean off of opioids This plan should start within one week post op of your joint replacement. Maintain the same interval or time between taking each dose and first decrease the dose.  Cut the total daily intake of opioids by one tablet each day Next start to increase the time between doses. The last dose that should be eliminated is the  evening dose.   MAKE SURE YOU:  Understand these instructions.  Get help right  away if you are not doing well or get worse.    Thank you for letting us  be a part of your medical care team.  It is a privilege we respect greatly.  We hope these instructions will help you stay on track for a fast and full recovery!

## 2023-12-17 NOTE — Telephone Encounter (Signed)
 Patient came into [facility] for his appointment but had to leave. Got a call from sister that he had to go and pick her up in [place] before his Right Knee Replacement Surgery today.   Patient stated that he was in today to just tell Dr. Jann Melody about having 1 episode of High Blood Pressure. Stated that he had tunnel vision and his Blood Pressure was around 190/95.   Patient stated that he is NOT sure that he took his Blood Pressure Medication that day.   Stated that he has NOT had anymore episodes and that his Blood Pressure has been well controlled with the medications.   Stated that he was just here to let you know of the Episode. Stated that he will call after his surgery and schedule an appointment.

## 2023-12-17 NOTE — Transfer of Care (Signed)
 Immediate Anesthesia Transfer of Care Note  Patient: Joshua Mercado  Procedure(s) Performed: ARTHROPLASTY, KNEE, TOTAL (Right: Knee)  Patient Location: PACU  Anesthesia Type:MAC combined with regional for post-op pain  Level of Consciousness: drowsy and patient cooperative  Airway & Oxygen Therapy: Patient Spontanous Breathing and Patient connected to face mask oxygen  Post-op Assessment: Report given to RN and Post -op Vital signs reviewed and stable  Post vital signs: Reviewed and stable  Last Vitals:  Vitals Value Taken Time  BP 94/70 12/17/23 1453  Temp    Pulse 67 12/17/23 1458  Resp 15 12/17/23 1458  SpO2 92 % 12/17/23 1458  Vitals shown include unfiled device data.  Last Pain:  Vitals:   12/17/23 1017  TempSrc:   PainSc: 0-No pain         Complications: No notable events documented.

## 2023-12-18 ENCOUNTER — Encounter (HOSPITAL_COMMUNITY): Payer: Self-pay | Admitting: Orthopedic Surgery

## 2023-12-18 DIAGNOSIS — J449 Chronic obstructive pulmonary disease, unspecified: Secondary | ICD-10-CM | POA: Diagnosis not present

## 2023-12-18 DIAGNOSIS — M1711 Unilateral primary osteoarthritis, right knee: Secondary | ICD-10-CM | POA: Diagnosis not present

## 2023-12-18 DIAGNOSIS — Z79899 Other long term (current) drug therapy: Secondary | ICD-10-CM | POA: Diagnosis not present

## 2023-12-18 DIAGNOSIS — Z87891 Personal history of nicotine dependence: Secondary | ICD-10-CM | POA: Diagnosis not present

## 2023-12-18 DIAGNOSIS — I10 Essential (primary) hypertension: Secondary | ICD-10-CM | POA: Diagnosis not present

## 2023-12-18 NOTE — Evaluation (Signed)
 Physical Therapy Evaluation Patient Details Name: Joshua Mercado MRN: [id number] DOB: 02/12/1946 Today's Date: 12/18/2023  History of Present Illness  Pt s/p R TKR and with hx of COPD and hearing loss  Clinical Impression  Pt s/p R TKR and presents with decreased R LE strength/ROM and post op pain limiting functional mobility.  Pt should progress well with mobility and plans initial short stay at [facility] rehab prior to return to IND living setting.        If plan is discharge home, recommend the following: A little help with walking and/or transfers;A little help with bathing/dressing/bathroom;Assistance with cooking/housework;Assist for transportation;Help with stairs or ramp for entrance   Can travel by private vehicle        Equipment Recommendations Rolling walker (2 wheels)  Recommendations for Other Services       Functional Status Assessment Patient has had a recent decline in their functional status and demonstrates the ability to make significant improvements in function in a reasonable and predictable amount of time.     Precautions / Restrictions Precautions Precautions: Knee;Fall Required Braces or Orthoses: Knee Immobilizer - Right Knee Immobilizer - Right: Discontinue once straight leg raise with < 10 degree lag (Pt performed IND SLR this am) Restrictions Weight Bearing Restrictions Per Provider Order: No RLE Weight Bearing Per Provider Order: Weight bearing as tolerated      Mobility  Bed Mobility Overal bed mobility: Needs Assistance Bed Mobility: Supine to Sit     Supine to sit: Contact guard     General bed mobility comments: Increased time with cues for sequence and use of L LE to self assist    Transfers Overall transfer level: Needs assistance Equipment used: Rolling walker (2 wheels) Transfers: Sit to/from Stand Sit to Stand: Min assist, Contact guard assist           General transfer comment: cues for LE management and use of UEs to self  assist    Ambulation/Gait Ambulation/Gait assistance: Min assist, Contact guard assist Gait Distance (Feet): 85 Feet Assistive device: Rolling walker (2 wheels) Gait Pattern/deviations: Step-to pattern, Decreased step length - right, Decreased step length - left, Shuffle, Trunk flexed Gait velocity: decr     General Gait Details: cues for sequence, posture and position from [employer]            Wheelchair Mobility     Tilt Bed    Modified Rankin (Stroke Patients Only)       Balance Overall balance assessment: Needs assistance Sitting-balance support: No upper extremity supported, Feet supported Sitting balance-Leahy Scale: Good     Standing balance support: Single extremity supported Standing balance-Leahy Scale: Poor                               Pertinent Vitals/Pain Pain Assessment Pain Assessment: 0-10 Pain Score: 5  Pain Location: R knee Pain Descriptors / Indicators: Aching, Grimacing, Sore Pain Intervention(s): Limited activity within patient's tolerance, Monitored during session, Premedicated before session, Ice applied    Home Living Family/patient expects to be discharged to:: Inpatient rehab Living Arrangements: Alone                 Additional Comments: [facility] living resident    Prior Function Prior Level of Function : Independent/Modified Independent                     Extremity/Trunk Assessment  Upper Extremity Assessment Upper Extremity Assessment: Overall WFL for tasks assessed    Lower Extremity Assessment Lower Extremity Assessment: RLE deficits/detail RLE Deficits / Details: IND SLR; AAROM at knee -5 - 45 pain limited    Cervical / Trunk Assessment Cervical / Trunk Assessment: Normal  Communication   Communication Communication: No apparent difficulties    Cognition Arousal: Alert Behavior During Therapy: WFL for tasks assessed/performed   PT - Cognitive impairments: No apparent  impairments                         Following commands: Intact       Cueing Cueing Techniques: Verbal cues     General Comments      Exercises Total Joint Exercises Ankle Circles/Pumps: AROM, Both, Supine, 20 reps Quad Sets: AROM, Both, 10 reps, Supine Heel Slides: AAROM, Right, 15 reps, Supine Straight Leg Raises: AAROM, AROM, Right, 15 reps, Supine   Assessment/Plan    PT Assessment Patient needs continued PT services  PT Problem List Decreased strength;Decreased range of motion;Decreased activity tolerance;Decreased balance;Decreased mobility;Decreased knowledge of use of DME;Pain       PT Treatment Interventions DME instruction;Gait training;Functional mobility training;Therapeutic activities;Therapeutic exercise;Patient/family education    PT Goals (Current goals can be found in the Care Plan section)  Acute Rehab PT Goals Patient Stated Goal: Regain IND PT Goal Formulation: With patient Time For Goal Achievement: 12/25/23 Potential to Achieve Goals: Good    Frequency 7X/week     Co-evaluation               AM-PAC PT "6 Clicks" Mobility  Outcome Measure Help needed turning from your back to your side while in a flat bed without using bedrails?: A Little Help needed moving from lying on your back to sitting on the side of a flat bed without using bedrails?: A Little Help needed moving to and from a bed to a chair (including a wheelchair)?: A Little Help needed standing up from a chair using your arms (e.g., wheelchair or bedside chair)?: A Little Help needed to walk in hospital room?: A Little Help needed climbing 3-5 steps with a railing? : A Little 6 Click Score: 18    End of Session Equipment Utilized During Treatment: Gait belt Activity Tolerance: Patient tolerated treatment well Patient left: in chair;with call bell/phone within reach;with chair alarm set;with family/visitor present Nurse Communication: Mobility status PT Visit  Diagnosis: Difficulty in walking, not elsewhere classified (R26.2)    Time: 1610-9604 PT Time Calculation (min) (ACUTE ONLY): 35 min   Charges:   PT Evaluation $PT Eval Low Complexity: 1 Low PT Treatments $Therapeutic Exercise: 8-22 mins PT General Charges $$ ACUTE PT VISIT: 1 Visit         Thedora Finlay PT Acute Rehabilitation Services Pager [phone] Office [phone]   Kawanda Drumheller 12/18/2023, 9:23 AM

## 2023-12-18 NOTE — Care Management Obs Status (Signed)
 MEDICARE OBSERVATION STATUS NOTIFICATION   Patient Details  Name: Joshua Mercado MRN: [id number] Date of Birth: May 06, 1946   Medicare Observation Status Notification Given:  Yes    Jonni Nettle, LCSW 12/18/2023, 11:44 AM

## 2023-12-18 NOTE — Progress Notes (Signed)
 Physical Therapy Treatment Patient Details Name: Joshua Mercado MRN: [id number] DOB: 08/04/1945 Today's Date: 12/18/2023   History of Present Illness Pt s/p R TKR and with hx of COPD and hearing loss    PT Comments  Pt continues very motivated - had just returned to bed with nursing after using bathroom.  Pt reviewed written therex program with one additional exercise introduced and performed.  Pt and sister with multiple questions asked and answered.  Pt eager for dc to [facility] rehab setting.    If plan is discharge home, recommend the following: A little help with walking and/or transfers;A little help with bathing/dressing/bathroom;Assistance with cooking/housework;Assist for transportation;Help with stairs or ramp for entrance   Can travel by private vehicle        Equipment Recommendations  Rolling walker (2 wheels)    Recommendations for Other Services       Precautions / Restrictions Precautions Precautions: Knee;Fall Required Braces or Orthoses: Knee Immobilizer - Right Knee Immobilizer - Right: Discontinue once straight leg raise with < 10 degree lag (Pt performed IND SLR this am) Restrictions Weight Bearing Restrictions Per Provider Order: No RLE Weight Bearing Per Provider Order: Weight bearing as tolerated     Mobility  Bed Mobility Overal bed mobility: Needs Assistance Bed Mobility: Supine to Sit     Supine to sit: Contact guard     General bed mobility comments: Increased time with cues for sequence and use of L LE to self assist    Transfers Overall transfer level: Needs assistance Equipment used: Rolling walker (2 wheels) Transfers: Sit to/from Stand Sit to Stand: Min assist, Contact guard assist           General transfer comment: cues for LE management and use of UEs to self assist    Ambulation/Gait Ambulation/Gait assistance: Min assist, Contact guard assist Gait Distance (Feet): 85 Feet Assistive device: Rolling walker (2 wheels) Gait  Pattern/deviations: Step-to pattern, Decreased step length - right, Decreased step length - left, Shuffle, Trunk flexed Gait velocity: decr     General Gait Details: cues for sequence, posture and position from [employer]             Wheelchair Mobility     Tilt Bed    Modified Rankin (Stroke Patients Only)       Balance Overall balance assessment: Needs assistance Sitting-balance support: No upper extremity supported, Feet supported Sitting balance-Leahy Scale: Good     Standing balance support: Single extremity supported Standing balance-Leahy Scale: Poor                              Communication Communication Communication: No apparent difficulties  Cognition Arousal: Alert Behavior During Therapy: WFL for tasks assessed/performed   PT - Cognitive impairments: No apparent impairments                         Following commands: Intact      Cueing Cueing Techniques: Verbal cues  Exercises Total Joint Exercises Ankle Circles/Pumps: AROM, Both, Supine, 20 reps Quad Sets: AROM, Both, 10 reps, Supine Heel Slides: AAROM, Right, 15 reps, Supine Hip ABduction/ADduction: AAROM, Right, 15 reps, Supine Straight Leg Raises: AAROM, AROM, Right, 15 reps, Supine    General Comments        Pertinent Vitals/Pain Pain Assessment Pain Assessment: 0-10 Pain Score: 5  Pain Location: R knee Pain Descriptors / Indicators: Aching, Grimacing, Sore  Pain Intervention(s): Limited activity within patient's tolerance, Monitored during session, Premedicated before session, Ice applied    Home Living Family/patient expects to be discharged to:: Inpatient rehab Living Arrangements: Alone                 Additional Comments: [facility] living resident    Prior Function            PT Goals (current goals can now be found in the care plan section) Acute Rehab PT Goals Patient Stated Goal: Regain IND PT Goal Formulation: With  patient Time For Goal Achievement: 12/25/23 Potential to Achieve Goals: Good Progress towards PT goals: Progressing toward goals    Frequency    7X/week      PT Plan      Co-evaluation              AM-PAC PT "6 Clicks" Mobility   Outcome Measure  Help needed turning from your back to your side while in a flat bed without using bedrails?: A Little Help needed moving from lying on your back to sitting on the side of a flat bed without using bedrails?: A Little Help needed moving to and from a bed to a chair (including a wheelchair)?: A Little Help needed standing up from a chair using your arms (e.g., wheelchair or bedside chair)?: A Little Help needed to walk in hospital room?: A Little Help needed climbing 3-5 steps with a railing? : A Little 6 Click Score: 18    End of Session Equipment Utilized During Treatment: Gait belt Activity Tolerance: Patient tolerated treatment well Patient left: in bed;with call bell/phone within reach;with bed alarm set Nurse Communication: Mobility status PT Visit Diagnosis: Difficulty in walking, not elsewhere classified (R26.2)     Time: 1610-9604 PT Time Calculation (min) (ACUTE ONLY): 14 min  Charges:    $Therapeutic Exercise: 8-22 mins $Therapeutic Activity: 8-22 mins PT General Charges $$ ACUTE PT VISIT: 1 Visit                     Thedora Finlay PT Acute Rehabilitation Services Pager [phone] Office [phone]    Joshua Mercado 12/18/2023, 11:51 AM

## 2023-12-18 NOTE — Plan of Care (Signed)

## 2023-12-18 NOTE — [place] FL2 (Signed)
 [place]  MEDICAID FL2 LEVEL OF CARE FORM     IDENTIFICATION  Patient Name: Joshua Mercado Birthdate: Sep 16, 1945 Sex: male Admission Date (Current Location): 12/17/2023  [facility] and [place] Number:  Producer, television/film/video and Address:  [facility],  [address] [place], [place] [address]      Provider Number:    Attending Physician Name and Address:  Winston Hawking, MD  Relative Name and Phone Number:  Zornes/Cunningham,Suellen (Sister)  [phone]    Current Level of Care: Hospital Recommended Level of Care: Skilled Nursing Facility Prior Approval Number:    Date Approved/Denied:   PASRR Number: [id number]  Discharge Plan: SNF    Current Diagnoses: Patient Active Problem List   Diagnosis Date Noted   Status post total knee replacement, right 12/17/2023   Current severe episode of major depressive disorder without psychotic features without prior episode (HCC) 10/14/2023   Mild aortic regurgitation 07/15/2023   PVC (premature ventricular contraction) 07/15/2023   Sinus arrhythmia 07/15/2023   White coat syndrome without diagnosis of hypertension 07/15/2023   Postural dizziness with presyncope 03/16/2023   Screening for colon cancer    Polyp of descending colon    Esophageal dysphagia    Stricture and stenosis of esophagus    Thrombocytopenia (HCC) 05/19/2020   Benign prostatic hyperplasia with nocturia 05/13/2020   Low testosterone  05/13/2020   Other male erectile dysfunction 05/13/2020   Primary insomnia 05/13/2020   Mixed hyperlipidemia 05/13/2020   Chronic obstructive pulmonary disease (HCC) 01/19/2020   Mixed hyperlipidemia 01/19/2020   Essential hypertension 01/19/2020   Benign prostatic hyperplasia with urinary frequency 01/19/2020   Family hx of prostate cancer 01/19/2020   Hypersomnia 01/30/2016   Moderate obstructive sleep apnea 01/30/2016   Snoring 01/30/2016    Orientation RESPIRATION BLADDER Height & Weight     Self, Time, Situation,  Place  Normal Continent Weight: 197 lb (89.4 kg) Height:  6' (182.9 cm)  BEHAVIORAL SYMPTOMS/MOOD NEUROLOGICAL BOWEL NUTRITION STATUS      Continent Diet (low sodium heart healthy)  AMBULATORY STATUS COMMUNICATION OF NEEDS Skin   Limited Assist Verbally Surgical wounds (Incision (Closed) 12/17/23 Knee Left)                       Personal Care Assistance Level of Assistance  Bathing, Feeding, Dressing Bathing Assistance: Limited assistance Feeding assistance: Independent Dressing Assistance: Limited assistance     Functional Limitations Info  Sight, Hearing, Speech Sight Info: Adequate Hearing Info: Impaired (hard-of-hearing) Speech Info: Adequate    SPECIAL CARE FACTORS FREQUENCY  PT (By licensed PT), OT (By licensed OT)     PT Frequency: 5x per week OT Frequency: 5x per week            Contractures Contractures Info: Not present    Additional Factors Info  Code Status, Allergies Code Status Info: FULL Allergies Info: Bee Pollen, Bee Venom           Current Medications (12/18/2023):  This is the current hospital active medication list Current Facility-Administered Medications  Medication Dose Route Frequency Provider Last Rate Last Admin   acetaminophen  (TYLENOL ) tablet 325-650 mg  325-650 mg Oral Q6H PRN Winston Hawking, MD       albuterol  (PROVENTIL ) (2.5 MG/3ML) 0.083% nebulizer solution 2.5 mg  2.5 mg Nebulization Q6H PRN Winston Hawking, MD       Apoaequorin CAPS 1 capsule  1 capsule Oral Daily Winston Hawking, MD       ascorbic acid  (VITAMIN C )  tablet 1,000 mg  1,000 mg Oral Daily Winston Hawking, MD   1,000 mg at 12/18/23 1029   aspirin  chewable tablet 81 mg  81 mg Oral BID Winston Hawking, MD   81 mg at 12/18/23 1029   clotrimazole  (LOTRIMIN ) 1 % cream   Topical BID Winston Hawking, MD   Self Administered at 12/18/23 1028   cyanocobalamin  (VITAMIN B12) tablet 500 mcg  500 mcg Oral Daily Winston Hawking, MD   500 mcg at 12/18/23 1029   cyclobenzaprine  (FLEXERIL )  tablet 10 mg  10 mg Oral TID PRN Winston Hawking, MD   10 mg at 12/18/23 1030   docusate sodium  (COLACE) capsule 100 mg  100 mg Oral BID Winston Hawking, MD   100 mg at 12/18/23 1029   escitalopram  (LEXAPRO ) tablet 10 mg  10 mg Oral Daily Winston Hawking, MD   10 mg at 12/18/23 1030   finasteride  (PROSCAR ) tablet 5 mg  5 mg Oral Daily Winston Hawking, MD   5 mg at 12/18/23 1030   fluticasone  (FLONASE ) 50 MCG/ACT nasal spray 1 spray  1 spray Each Nare Daily Winston Hawking, MD   1 spray at 12/18/23 1028   HYDROmorphone  (DILAUDID ) injection 0.5-1 mg  0.5-1 mg Intravenous Q4H PRN Winston Hawking, MD       losartan  (COZAAR ) tablet 100 mg  100 mg Oral Daily Winston Hawking, MD   100 mg at 12/18/23 1030   melatonin tablet 5 mg  5 mg Oral QHS Norris, Steve, MD   5 mg at 12/17/23 2023   menthol -cetylpyridinium (CEPACOL) lozenge 3 mg  1 lozenge Oral PRN Winston Hawking, MD       Or   phenol (CHLORASEPTIC) mouth spray 1 spray  1 spray Mouth/Throat PRN Winston Hawking, MD       metoCLOPramide  (REGLAN ) tablet 5-10 mg  5-10 mg Oral Q8H PRN Winston Hawking, MD       Or   metoCLOPramide  (REGLAN ) injection 5-10 mg  5-10 mg Intravenous Q8H PRN Winston Hawking, MD       metoprolol  succinate (TOPROL -XL) 24 hr tablet 25 mg  25 mg Oral Daily Winston Hawking, MD   25 mg at 12/18/23 1029   multivitamin with minerals tablet 1 tablet  1 tablet Oral Daily Winston Hawking, MD   1 tablet at 12/18/23 1029   ondansetron  (ZOFRAN ) tablet 4 mg  4 mg Oral Q6H PRN Winston Hawking, MD       Or   ondansetron  (ZOFRAN ) injection 4 mg  4 mg Intravenous Q6H PRN Winston Hawking, MD   4 mg at 12/18/23 0111   oxyCODONE  (Oxy IR/ROXICODONE ) immediate release tablet 5-10 mg  5-10 mg Oral Q4H PRN Winston Hawking, MD   10 mg at 12/18/23 1103   pantoprazole  (PROTONIX ) EC tablet 40 mg  40 mg Oral Daily Winston Hawking, MD   40 mg at 12/18/23 1029   polyethylene glycol (MIRALAX  / GLYCOLAX ) packet 17 g  17 g Oral Daily PRN Winston Hawking, MD       simvastatin  (ZOCOR ) tablet 40 mg   40 mg Oral Daily Winston Hawking, MD   40 mg at 12/18/23 1030   sodium chloride  flush (NS) 0.9 % injection 3-10 mL  3-10 mL Intravenous Q12H Norris, Steve, MD   5 mL at 12/18/23 1035   sodium chloride  flush (NS) 0.9 % injection 3-10 mL  3-10 mL Intravenous PRN Winston Hawking, MD       tamsulosin  (FLOMAX ) capsule 0.4 mg  0.4 mg Oral Daily Brunilda Capra,  Siegfried Dress, MD   0.4 mg at 12/18/23 1030   Testosterone  PT24 1 patch  1 patch Transdermal PRN Winston Hawking, MD       triamcinolone  cream (KENALOG ) 0.1 % cream   Topical BID Winston Hawking, MD   Self Administered at 12/18/23 1028   umeclidinium bromide  (INCRUSE ELLIPTA ) 62.5 MCG/ACT 1 puff  1 puff Inhalation Daily Ellington, Abby K, RPH   1 puff at 12/18/23 1610     Discharge Medications: Please see discharge summary for a list of discharge medications.  Relevant Imaging Results:  Relevant Lab Results:   Additional Information SSN: [id number]  Melinda Sprawls Ceirra Belli, LCSW

## 2023-12-18 NOTE — Progress Notes (Signed)
 Subjective: 1 Day Post-Op Procedure(s) (LRB): ARTHROPLASTY, KNEE, TOTAL (Right) Patient reports pain as 4 on 0-10 scale.   Denies CP or SOB.  Voiding without difficulty. Positive flatus. Objective: Vital signs in last 24 hours: Temp:  [97.1 F (36.2 C)-98.7 F (37.1 C)] 97.9 F (36.6 C) (06/07 0337) Pulse Rate:  [35-98] 98 (06/07 0337) Resp:  [12-21] 17 (06/07 0337) BP: (94-172)/(52-90) 133/80 (06/07 0337) SpO2:  [91 %-97 %] 91 % (06/07 0634) Weight:  [89.4 kg] 89.4 kg (06/06 1730)  Intake/Output from previous day: 06/06 0701 - 06/07 0700 In: 960 [P.O.:960] Out: 1525 [Urine:1525] Intake/Output this shift: No intake/output data recorded.  No results for input(s): "HGB" in the last 72 hours. No results for input(s): "WBC", "RBC", "HCT", "PLT" in the last 72 hours. No results for input(s): "NA", "K", "CL", "CO2", "BUN", "CREATININE", "GLUCOSE", "CALCIUM" in the last 72 hours. No results for input(s): "LABPT", "INR" in the last 72 hours.  Neurologically intact Sensation intact distally Intact pulses distally Dorsiflexion/Plantar flexion intact Incision: dressing C/D/I Compartment soft No DVT  Assessment/Plan:  1 Day Post-Op Procedure(s) (LRB): ARTHROPLASTY, KNEE, TOTAL (Right) Doing well. D/C to SNF   Principal Problem:   Status post total knee replacement, right

## 2023-12-18 NOTE — Progress Notes (Signed)
 Patient unable to urinate. Paged Emerge.

## 2023-12-18 NOTE — Progress Notes (Signed)
 Patient ambulated 80 feet in the hall. Tolerated well.

## 2023-12-18 NOTE — Discharge Summary (Signed)
 Patient ID: Joshua Mercado MRN: [id number] DOB/AGE: 80-Dec-1947 80 y.o.  Admit date: 12/17/2023 Discharge date: 12/18/2023  Admission Diagnoses:  Principal Problem:   Status post total knee replacement, right   Discharge Diagnoses:  Same  Past Medical History:  Diagnosis Date   Arthritis    Knees   COPD (chronic obstructive pulmonary disease) (HCC)    Does use hearing aid    Bilateral   Dysrhythmia    Enlarged prostate    GERD (gastroesophageal reflux disease)    Hypercholesterolemia    Hypertension    Sleep apnea     Surgeries: Procedure(s): ARTHROPLASTY, KNEE, TOTAL on 12/17/2023   Consultants:   Discharged Condition: Improved  Hospital Course: Joshua Mercado is an 78 y.o. male who was admitted 12/17/2023 for operative treatment ofStatus post total knee replacement, right. Patient has severe unremitting pain that affects sleep, daily activities, and work/hobbies. After pre-op clearance the patient was taken to the operating room on 12/17/2023 and underwent  Procedure(s): ARTHROPLASTY, KNEE, TOTAL.    Patient was given perioperative antibiotics:  Anti-infectives (From admission, onward)    Start     Dose/Rate Route Frequency Ordered Stop   12/17/23 1900  ceFAZolin  (ANCEF ) IVPB 2g/100 mL premix       Note to Pharmacy: Patient tolerated well in the OR   2 g 200 mL/hr over 30 Minutes Intravenous Every 6 hours 12/17/23 1725 12/18/23 0047   12/17/23 1000  ceFAZolin  (ANCEF ) IVPB 2g/100 mL premix        2 g 200 mL/hr over 30 Minutes Intravenous On call to O.R. 12/17/23 0956 12/17/23 1314        Patient was given sequential compression devices, early ambulation, and chemoprophylaxis to prevent DVT.  Patient benefited maximally from hospital stay and there were no complications.    Recent vital signs: Patient Vitals for the past 24 hrs:  BP Temp Temp src Pulse Resp SpO2 Height Weight  12/18/23 0634 -- -- -- -- -- 91 % -- --  12/18/23 0337 133/80 97.9 F (36.6 C) -- 98 17 92 % -- --   12/17/23 2331 (!) 106/52 97.9 F (36.6 C) -- (!) 51 14 96 % -- --  12/17/23 1934 -- -- -- 97 -- 96 % -- --  12/17/23 1934 121/70 97.9 F (36.6 C) -- (!) 48 16 96 % -- --  12/17/23 1731 (!) 140/85 (!) 97.1 F (36.2 C) -- (!) 50 16 97 % -- --  12/17/23 1730 -- -- -- -- -- -- 6' (1.829 m) 89.4 kg  12/17/23 1700 (!) 161/68 98.2 F (36.8 C) -- 95 16 96 % -- --  12/17/23 1630 (!) 156/72 -- -- 62 17 95 % -- --  12/17/23 1615 (!) 153/77 -- -- (!) 35 17 92 % -- --  12/17/23 1600 (!) 151/70 -- -- 88 15 93 % -- --  12/17/23 1545 (!) 147/65 -- -- (!) 59 13 92 % -- --  12/17/23 1543 -- -- -- (!) 35 15 92 % -- --  12/17/23 1530 136/67 -- -- (!) 57 12 92 % -- --  12/17/23 1515 136/75 -- -- (!) 55 16 93 % -- --  12/17/23 1509 -- -- -- 61 18 92 % -- --  12/17/23 1500 (!) 122/56 -- -- 61 15 92 % -- --  12/17/23 1454 94/70 98.7 F (37.1 C) -- 69 17 91 % -- --  12/17/23 1216 130/88 -- -- (!) 55 17 93 % -- --  12/17/23 1215  130/88 -- -- (!) 59 16 93 % -- --  12/17/23 1212 (!) 154/74 -- -- (!) 54 (!) 21 96 % -- --  12/17/23 1210 (!) 154/74 -- -- (!) 57 15 96 % -- --  12/17/23 1205 (!) 172/90 -- -- (!) 57 16 96 % -- --  12/17/23 1017 -- -- -- -- -- -- -- 89.4 kg  12/17/23 1007 (!) 160/82 97.7 F (36.5 C) Oral 66 15 94 % -- --     Recent laboratory studies: No results for input(s): "WBC", "HGB", "HCT", "PLT", "NA", "K", "CL", "CO2", "BUN", "CREATININE", "GLUCOSE", "INR", "CALCIUM" in the last 72 hours.  Invalid input(s): "PT", "2"   Discharge Medications:   Allergies as of 12/18/2023       Reactions   Bee Pollen Anaphylaxis   Bee Venom   Bee Venom    Throat/mouth swelling        Medication List     STOP taking these medications    OVER THE COUNTER MEDICATION   temazepam  15 MG capsule Commonly known as: RESTORIL        TAKE these medications    albuterol  (2.5 MG/3ML) 0.083% nebulizer solution Commonly known as: PROVENTIL  Take 3 mLs (2.5 mg total) by nebulization every 6 (six)  hours as needed for wheezing or shortness of breath.   aspirin  81 MG chewable tablet Commonly known as: Aspirin  Childrens Chew 1 tablet (81 mg total) by mouth 2 (two) times daily.   cyanocobalamin  500 MCG tablet Commonly known as: VITAMIN B12 Take 500 mcg by mouth daily.   cyclobenzaprine  10 MG tablet Commonly known as: FLEXERIL  TAKE 1 TABLET BY MOUTH TWICE  DAILY AS NEEDED FOR MUSCLE  SPASM(S)   escitalopram  20 MG tablet Commonly known as: LEXAPRO  Take 0.5 tablets (10 mg total) by mouth daily.   finasteride  5 MG tablet Commonly known as: PROSCAR  TAKE 1 TABLET EVERY DAY   fluticasone  50 MCG/ACT nasal spray Commonly known as: FLONASE  USE 1 SPRAY IN BOTH NOSTRILS DAILY   losartan  100 MG tablet Commonly known as: COZAAR  Take 1 tablet (100 mg total) by mouth daily.   Lotrisone  cream Generic drug: clotrimazole -betamethasone  Apply 1 Application topically 2 (two) times daily as needed.   melatonin 5 MG Tabs Take 5 mg by mouth at bedtime.   metoprolol  succinate 25 MG 24 hr tablet Commonly known as: TOPROL -XL Take 25 mg by mouth daily.   MSM 1000 MG Caps Take 1 capsule by mouth 2 (two) times daily.   Multivitamin Men 50+ Tabs Take 1 tablet by mouth daily.   ondansetron  4 MG tablet Commonly known as: Zofran  Take 1 tablet (4 mg total) by mouth every 8 (eight) hours as needed for refractory nausea / vomiting, vomiting or nausea.   oxyCODONE -acetaminophen  5-325 MG tablet Commonly known as: Percocet Take 1 tablet by mouth every 4 (four) hours as needed for severe pain (pain score 7-10).   pantoprazole  40 MG tablet Commonly known as: PROTONIX  TAKE 1 TABLET EVERY DAY   Prevagen 10 MG Caps Generic drug: Apoaequorin Take 1 capsule by mouth daily.   selenium  200 MCG Tabs tablet Take by mouth daily.   simvastatin  40 MG tablet Commonly known as: ZOCOR  Take 1 tablet (40 mg total) by mouth daily.   Spiriva  Respimat 2.5 MCG/ACT Aers Generic drug: Tiotropium Bromide   Monohydrate Inhale 2 puffs into the lungs daily.   tadalafil  20 MG tablet Commonly known as: CIALIS  Take 1 tablet (20 mg total) by mouth daily as needed for  erectile dysfunction.   tamsulosin  0.4 MG Caps capsule Commonly known as: FLOMAX  TAKE 1 CAPSULE EVERY DAY   Testosterone  2 MG/24HR Pt24 Place 1 patch onto the skin as needed.   triamcinolone  cream 0.1 % Commonly known as: KENALOG  APPLY  CREAM EXTERNALLY TO AFFECTED AREA TWICE DAILY   vitamin C  1000 MG tablet Take 1,000 mg by mouth daily.   Vitamin D3 125 MCG (5000 UT) Caps Take by mouth.        Diagnostic Studies: No results found.  Disposition: Discharge disposition: 03-Skilled Nursing Facility       Discharge Instructions     Call MD / Call 911   Complete by: As directed    If you experience chest pain or shortness of breath, CALL 911 and be transported to the hospital emergency room.  If you develope a fever above 101 F, pus (white drainage) or increased drainage or redness at the wound, or calf pain, call your surgeon's office.   Constipation Prevention   Complete by: As directed    Drink plenty of fluids.  Prune juice may be helpful.  You may use a stool softener, such as Colace (over the counter) 100 mg twice a day.  Use MiraLax  (over the counter) for constipation as needed.   Diet - low sodium heart healthy   Complete by: As directed    Increase activity slowly as tolerated   Complete by: As directed    Post-operative opioid taper instructions:   Complete by: As directed    POST-OPERATIVE OPIOID TAPER INSTRUCTIONS: It is important to wean off of your opioid medication as soon as possible. If you do not need pain medication after your surgery it is ok to stop day one. Opioids include: Codeine, Hydrocodone(Norco, Vicodin), Oxycodone (Percocet, oxycontin ) and hydromorphone  amongst others.  Long term and even short term use of opiods can cause: Increased pain  response Dependence Constipation Depression Respiratory depression And more.  Withdrawal symptoms can include Flu like symptoms Nausea, vomiting And more Techniques to manage these symptoms Hydrate well Eat regular healthy meals Stay active Use relaxation techniques(deep breathing, meditating, yoga) Do Not substitute Alcohol to help with tapering If you have been on opioids for less than two weeks and do not have pain than it is ok to stop all together.  Plan to wean off of opioids This plan should start within one week post op of your joint replacement. Maintain the same interval or time between taking each dose and first decrease the dose.  Cut the total daily intake of opioids by one tablet each day Next start to increase the time between doses. The last dose that should be eliminated is the evening dose.           Follow-up Information     Belton Boy, PA-C. Go on 12/29/2023.   Specialty: Physician Assistant Why: You are scheduled for a post op appointment 12/29/23 at 1:45pm Contact information: [address] [place] [address] [phone]         Winston Hawking, MD. Call in 2 week(s).   Specialty: Orthopedic Surgery Why: call [phone] for appt in two weeks Contact information: [address] [place] [address] [phone]                  Signed: Meghna Hagmann M Tezra Mahr 12/18/2023, 9:03 AM

## 2023-12-18 NOTE — Progress Notes (Signed)
 Patient urinated 450 mL of amber, clear urine.

## 2023-12-18 NOTE — Op Note (Unsigned)
 [name], Joshua Mercado MEDICAL RECORD NO: [id number] ACCOUNT NO: [id number] DATE OF BIRTH: May 05, 1946 FACILITY: [facility]ATION: [id number] PHYSICIAN: Loreta Rome. Brunilda Capra, MD  Operative Report   DATE OF PROCEDURE: 12/17/2023   PREOPERATIVE DIAGNOSIS: Right knee end-stage arthritis.  POSTOPERATIVE DIAGNOSIS: Right knee end-stage arthritis.  PROCEDURE PERFORMED: Right total knee arthroplasty using DePuy Attune prosthesis.  ATTENDING SURGEON: Loreta Rome. Brunilda Capra, MD  ASSISTANT: Barton Like Dixon, [place], who was scrubbed during the entire procedure, and necessary for satisfactory completion of surgery.  ANESTHESIA: Spinal anesthesia plus adductor canal block was utilized.  ESTIMATED BLOOD LOSS: Minimal.  FLUID REPLACEMENT: 1500 mL crystalloid.  INSTRUMENT COUNTS: Correct.  COMPLICATIONS: None.  ANTIBIOTICS: Perioperative antibiotics were given.  INDICATIONS: The patient is an 78 year old male who presents with a history of worsening right knee pain due to end-stage arthritis, bone-on-bone. The patient has failed conservative management and desires operative total knee arthroplasty to eliminate  pain and restore function. Informed consent obtained.  DESCRIPTION OF PROCEDURE: After an adequate level of spinal anesthesia plus adductor canal block was utilized, the patient was placed in the supine position. A nonsterile tourniquet was placed on the right proximal thigh. The right leg was sterilely  prepped and draped in the usual manner. A timeout was called verifying correct patient and correct site. We elevated the leg and exsanguinated with an Esmarch bandage, inflating the tourniquet to 275 mmHg. We then placed the knee in flexion and performed  a longitudinal midline incision with a 10-blade scalpel. Dissection was carried down through the subcutaneous tissues. We used a fresh #10 blade for the medial parapatellar arthrotomy. We then divided the lateral patellofemoral ligaments, everting the  patella  and exposing the distal femur, which was devoid of cartilage. We entered the distal femur with a step-cut drill. We then placed our intramedullary guide and resected 10 mm off the distal femur set on 5 degrees of valgus as the patient had a  slight flexion contracture. We then sized the femur to a size 8 anterior down performing anterior, posterior, and chamfer cuts with a 4-in-1 block. Next, we removed ACL/PCL and meniscal tissue, subluxing the tibia anteriorly and then cutting our tibia 90  degrees perpendicular to the long axis of the tibia with minimal posterior slope for this posterior cruciate substituting prosthesis. We used the external jig and resected 2 mm off the affected medial side. Once we had that tibial resection done, we  used a lamina spreader to remove posterior femoral condyle osteophytes and to inject the posterior capsule with a combination of Marcaine , Exparel , and saline. Once we had that done, we checked our gaps, which were symmetric at 6 mm. We then went ahead  and removed our pins and completed our tibia preparation for the size 8 tibia, placed in the center point for the tibial tray, the medial third of the tubercle. Once we had that prepped with the modular drill and a keel punch, we went ahead and had our  size 8 component in place. We went to the femur and cut our box for the 8 right femur. We then impacted the trial femur in place, drilled the lug holes and placed a size 8, 7-mm poly trial. We were able to get the knee reduced and into extension. We next  went ahead and resurfaced the patella going from a 26-mm thickness down to a 16-mm thickness with the oscillating saw and drilling lug holes for the 41 patellar button. We had excellent patellar tracking with no-touch technique. The  knee was stable and  we were able to achieve full extension. We removed all trial components. We irrigated thoroughly using a pulsatile lavage. We then dried the bone well and vacuum mixed high  viscosity cement, cementing the components into place including the femur, tibia,  and the patella. We placed the knee in extension with a 7-mm poly to compress the bone during the setup process. We also used a patellar clamp on the patella to hold the cement while it set up to hold pressure. Once all the cement was hard on the back  table, we removed excess cement with a quarter-inch curved osteotome. We inspected the entirety of the knee, irrigated well, and then selected a size 8-mm poly, placed on the tibial tray, reduced the knee. We were able to get to full extension and had  excellent flexion and stability. There was a nice pop as the medial condyle reduced. Patellar tracking was excellent. We irrigated thoroughly. We then injected the anterior capsule with a combination of Marcaine , Exparel , and saline and then closed the  parapatellar arthrotomy with #1 Vicryl suture followed by 0 and 2-0 layered subcutaneous closure and 4-0 Monocryl for skin. Steri-Strips were applied followed by a sterile dressing. The patient tolerated the surgery well.    SUJ D: 12/17/2023 2:54:43 pm T: 12/18/2023 1:29:00 am  JOB: [id number]

## 2023-12-18 NOTE — TOC Initial Note (Addendum)
 Transition of Care [facility]) - Initial/Assessment Note    Patient Details  Name: Joshua Mercado MRN: [id number] Date of Birth: 02/05/1946  [facility]) CM/SW Contact:    Jonni Nettle, LCSW Phone Number: 12/18/2023, 1:54 PM  Clinical Narrative:                 CSW spoke with pt at bedside to discuss discharge plans. Pt reports he lives at [facility] independent living community. Pt reports he has been accepted to [facility] for short-term rehabilitation. CSW spoke with Kolleen Perone case manager weekend staff, [phone] Ext. [phone], who reports "it looks like pt's been recommended for PT/OT 2-3x per week at [facility]." When asked about what LOC, [facility] case manager reports "I'm not sure, it doesn't say." Per Ortho Bundle, RW ordered through [employer].  CSW met with pt and sister at bedside to discuss [facility] discharge plans. CSW explained that [facility] was recommended by Ortho team prior to surgery being performed. Pt and sister were adamant that pt has bed at [facility] for SNF, states they spoke with Cain Castillo and completed paperwork. Pt sister to transport pt at discharge.  CSW spoke with [facility], Cain Castillo, [phone], who confirmed pt's acceptance, pending insurance authorization. CSW requested insurance auth. Insurance [employer]gf[employer] is pending at this time. TOC will continue to follow.    Expected Discharge Plan: Skilled Nursing Facility Barriers to Discharge: Insurance Authorization   Patient Goals and CMS Choice Patient states their goals for this hospitalization and ongoing recovery are:: To go to [facility] for [employer].gov Compare Post Acute Care list provided to:: Patient Choice offered to / list presented to : Patient [place] ownership interest in [facility].provided to:: Patient    Expected Discharge Plan and Services In-house Referral: Clinical Social Work   Post Acute Care Choice: Skilled  Nursing Facility Living arrangements for the past 2 months: Independent Living Facility Expected Discharge Date: 12/18/23               DME Arranged: Otho Blitz rolling DME Agency: [employer] Date DME Agency Contacted: 12/17/23     HH Arranged: NA HH Agency: NA        Prior Living Arrangements/Services Living arrangements for the past 2 months: Independent Living Facility Lives with:: Self Patient language and need for interpreter reviewed:: Yes Do you feel safe going back to the place where you live?: Yes      Need for Family Participation in Patient Care: No (Comment) Care giver support system in place?: Yes (comment)   Criminal Activity/Legal Involvement Pertinent to Current Situation/Hospitalization: No - Comment as needed  Activities of Daily Living   ADL Screening (condition at time of admission) Independently performs ADLs?: Yes (appropriate for developmental age) Is the patient deaf or have difficulty hearing?: Yes Does the patient have difficulty seeing, even when wearing glasses/contacts?: No Does the patient have difficulty concentrating, remembering, or making decisions?: No  Permission Sought/Granted Permission sought to share information with : Facility Medical sales representative, Family Supports Permission granted to share information with : Yes, Verbal Permission Granted  Share Information with NAME: Suellen Zornes Cunningham  Permission granted to share info w AGENCY: [facility]  Permission granted to share info w Relationship: Sister  Permission granted to share info w Contact Information: [phone]  Emotional Assessment Appearance:: Appears stated age, Well-Groomed Attitude/Demeanor/Rapport: Engaged Affect (typically observed): Accepting, Stable, Pleasant, Hopeful Orientation: : Oriented to Self, Oriented  to Place, Oriented to  Time, Oriented to Situation Alcohol / Substance Use: Not Applicable Psych Involvement: No (comment)  Admission  diagnosis:  Status post total knee replacement, right [Z96.651] Patient Active Problem List   Diagnosis Date Noted   Status post total knee replacement, right 12/17/2023   Current severe episode of major depressive disorder without psychotic features without prior episode (HCC) 10/14/2023   Mild aortic regurgitation 07/15/2023   PVC (premature ventricular contraction) 07/15/2023   Sinus arrhythmia 07/15/2023   White coat syndrome without diagnosis of hypertension 07/15/2023   Postural dizziness with presyncope 03/16/2023   Screening for colon cancer    Polyp of descending colon    Esophageal dysphagia    Stricture and stenosis of esophagus    Thrombocytopenia (HCC) 05/19/2020   Benign prostatic hyperplasia with nocturia 05/13/2020   Low testosterone  05/13/2020   Other male erectile dysfunction 05/13/2020   Primary insomnia 05/13/2020   Mixed hyperlipidemia 05/13/2020   Chronic obstructive pulmonary disease (HCC) 01/19/2020   Mixed hyperlipidemia 01/19/2020   Essential hypertension 01/19/2020   Benign prostatic hyperplasia with urinary frequency 01/19/2020   Family hx of prostate cancer 01/19/2020   Hypersomnia 01/30/2016   Moderate obstructive sleep apnea 01/30/2016   Snoring 01/30/2016   PCP:  Verma Gobble, NP Pharmacy:   [facility] [address], [place] - [address] [place] [address] Phone: [phone] Fax: [phone]  [facility] - [place], [place] - [address] [place] [address] Phone: [phone] Fax: [phone]     Social Drivers of Health (SDOH) Social History: SDOH Screenings   Food Insecurity: No Food Insecurity (12/17/2023)  Housing: Low Risk  (12/17/2023)  Transportation Needs: No Transportation Needs (12/17/2023)  Utilities: Not At Risk (12/17/2023)  Depression (PHQ2-9): Low Risk  (10/14/2023)  Social Connections: Moderately Integrated (12/17/2023)  Tobacco Use: Medium Risk  (12/17/2023)  Health Literacy: Unknown (04/21/2022)   Received from [facility]   SDOH Interventions: None indicated     Readmission Risk Interventions     No data to display

## 2023-12-19 DIAGNOSIS — M1711 Unilateral primary osteoarthritis, right knee: Secondary | ICD-10-CM | POA: Diagnosis not present

## 2023-12-19 DIAGNOSIS — Z87891 Personal history of nicotine dependence: Secondary | ICD-10-CM | POA: Diagnosis not present

## 2023-12-19 DIAGNOSIS — J449 Chronic obstructive pulmonary disease, unspecified: Secondary | ICD-10-CM | POA: Diagnosis not present

## 2023-12-19 DIAGNOSIS — Z79899 Other long term (current) drug therapy: Secondary | ICD-10-CM | POA: Diagnosis not present

## 2023-12-19 DIAGNOSIS — I10 Essential (primary) hypertension: Secondary | ICD-10-CM | POA: Diagnosis not present

## 2023-12-19 NOTE — Progress Notes (Signed)
 Physical Therapy Treatment Patient Details Name: Joshua Mercado MRN: [id number] DOB: 10/31/1945 Today's Date: 12/19/2023   History of Present Illness Pt s/p R TKR and with hx of COPD and hearing loss    PT Comments  Pt making steady progress toward goals. Ongoing education on self assist, and safe techniques with mobility. HEP reviewed. D/c plan remains appropriate.    If plan is discharge home, recommend the following: A little help with walking and/or transfers;A little help with bathing/dressing/bathroom;Assistance with cooking/housework;Assist for transportation;Help with stairs or ramp for entrance   Can travel by private vehicle        Equipment Recommendations  Rolling walker (2 wheels)    Recommendations for Other Services       Precautions / Restrictions Precautions Precautions: Knee;Fall Required Braces or Orthoses: Knee Immobilizer - Right Knee Immobilizer - Right: Discontinue once straight leg raise with < 10 degree lag (Pt performed IND SLR this am) Restrictions Weight Bearing Restrictions Per Provider Order: No RLE Weight Bearing Per Provider Order: Weight bearing as tolerated     Mobility  Bed Mobility Overal bed mobility: Needs Assistance Bed Mobility: Supine to Sit     Supine to sit: Min assist     General bed mobility comments: Increased time with cues for sequence and self assist. assist to safely elevate trunk and progress RLE off EOB    Transfers Overall transfer level: Needs assistance Equipment used: Rolling walker (2 wheels) Transfers: Sit to/from Stand Sit to Stand: Min assist           General transfer comment: cues for LE management and use of UEs to self assist. assist to rise and transition to RW    Ambulation/Gait Ambulation/Gait assistance: Min assist, Contact guard assist Gait Distance (Feet): 85 Feet Assistive device: Rolling walker (2 wheels) Gait Pattern/deviations: Step-to pattern, Decreased step length - right, Decreased step  length - left, Trunk flexed, Antalgic Gait velocity: decr     General Gait Details: cues for sequence, posture and position from [employer]             Wheelchair Mobility     Tilt Bed    Modified Rankin (Stroke Patients Only)       Balance Overall balance assessment: Needs assistance Sitting-balance support: No upper extremity supported, Feet supported Sitting balance-Leahy Scale: Good     Standing balance support: Single extremity supported Standing balance-Leahy Scale: Poor                              Communication Communication Communication: No apparent difficulties  Cognition Arousal: Alert Behavior During Therapy: WFL for tasks assessed/performed   PT - Cognitive impairments: No apparent impairments                         Following commands: Intact      Cueing Cueing Techniques: Verbal cues  Exercises Total Joint Exercises Ankle Circles/Pumps: AROM, Both, Supine, 20 reps Quad Sets: AROM, Both, 10 reps, Supine Heel Slides: AAROM, Right, Supine, 5 reps    General Comments        Pertinent Vitals/Pain Pain Assessment Pain Location: R knee Pain Descriptors / Indicators: Aching, Grimacing, Sore    Home Living                          Prior Function  PT Goals (current goals can now be found in the care plan section) Acute Rehab PT Goals Patient Stated Goal: Regain IND PT Goal Formulation: With patient Time For Goal Achievement: 12/25/23 Potential to Achieve Goals: Good Progress towards PT goals: Progressing toward goals    Frequency    7X/week      PT Plan      Co-evaluation              AM-PAC PT "6 Clicks" Mobility   Outcome Measure  Help needed turning from your back to your side while in a flat bed without using bedrails?: A Little Help needed moving from lying on your back to sitting on the side of a flat bed without using bedrails?: A Little Help needed moving to  and from a bed to a chair (including a wheelchair)?: A Little Help needed standing up from a chair using your arms (e.g., wheelchair or bedside chair)?: A Little Help needed to walk in hospital room?: A Little Help needed climbing 3-5 steps with a railing? : A Little 6 Click Score: 18    End of Session Equipment Utilized During Treatment: Gait belt Activity Tolerance: Patient tolerated treatment well Patient left: with call bell/phone within reach;with family/visitor present;Other (comment) (bathroom, RN aware) Nurse Communication: Mobility status PT Visit Diagnosis: Difficulty in walking, not elsewhere classified (R26.2)     Time: 4098-1191 PT Time Calculation (min) (ACUTE ONLY): 36 min  Charges:    $Gait Training: 8-22 mins $Therapeutic Exercise: 8-22 mins PT General Charges $$ ACUTE PT VISIT: 1 Visit                     Cedric Denison, PT  Acute Rehab Dept ([facility]) [phone]  12/19/2023    [facility] 12/19/2023, 1:28 PM

## 2023-12-19 NOTE — Progress Notes (Signed)
 Orthopedic Tech Progress Note Patient Details:  Joshua Mercado [birth year] [id number]  CPM Right Knee CPM Right Knee: Off Right Knee Flexion (Degrees): 75 Right Knee Extension (Degrees): 0 Additional Comments: Removed by nurse about 1630.  Post Interventions Patient Tolerated: Well  Herbie Loll 12/19/2023, 4:47 PM

## 2023-12-19 NOTE — Progress Notes (Signed)
 PT TX NOTE  12/19/23 1647  PT Visit Information  Last PT Received On 12/19/23  Assistance Needed Pt in CPM, reporting incr pain. Pt also requested to go to bathroom. Assisted out of CPM and with short distance amb to and from bathroom. Distance ltd by pain and dizziness. Pt is alert but very sleepy. O2 was in place at 2L beginning of session, replaced same, EOS. Continue to follow in acute setting.  History of Present Illness Pt s/p R TKR and with hx of COPD and hearing loss  Subjective Data  Patient Stated Goal Regain IND  Precautions  Precautions Knee;Fall  Required Braces or Orthoses Knee Immobilizer - Right  Knee Immobilizer - Right Discontinue once straight leg raise with < 10 degree lag (Pt performed IND SLR)  Restrictions  Weight Bearing Restrictions Per Provider Order No  RLE Weight Bearing Per Provider Order WBAT  Pain Assessment  Pain Assessment 0-10  Pain Score 10  Pain Location R knee  Pain Descriptors / Indicators Aching;Grimacing;Sore  Pain Intervention(s) Limited activity within patient's tolerance;Monitored during session;Premedicated before session;Repositioned  Cognition  Arousal Alert  Behavior During Therapy WFL for tasks assessed/performed  PT - Cognitive impairments No apparent impairments  Following Commands  Following commands Intact  Cueing  Cueing Techniques Verbal cues  Communication  Communication No apparent difficulties  Bed Mobility  Overal bed mobility Needs Assistance  Bed Mobility Supine to Sit;Sit to Supine  Supine to sit Min assist  Sit to supine Min assist  General bed mobility comments Increased time with cues for sequence and self assist. assist to safely elevate trunk and progress RLE off EOB  Transfers  Overall transfer level Needs assistance  Equipment used Rolling walker (2 wheels)  Transfers Sit to/from Stand  Sit to Stand Min assist  General transfer comment cues for LE management and use of UEs to self assist. assist to rise and  transition to RW. pt beginning to demonstrate some carryover  Ambulation/Gait  Ambulation/Gait assistance Min assist;Contact guard assist  Gait Distance (Feet) 12 Feet (x2)  Assistive device Rolling walker (2 wheels)  Gait Pattern/deviations Step-to pattern;Decreased step length - right;Decreased step length - left;Trunk flexed;Antalgic  General Gait Details cues for sequence, posture and position from RW  Gait velocity decr  Balance  Overall balance assessment Needs assistance  Sitting-balance support No upper extremity supported;Feet supported  Sitting balance-Leahy Scale Good  Standing balance support Single extremity supported  Standing balance-Leahy Scale Poor  Exercises  Exercises Total Joint  Total Joint Exercises  Ankle Circles/Pumps AROM;Both;Supine;20 reps  PT - End of Session  Equipment Utilized During Treatment Gait belt  Activity Tolerance Patient tolerated treatment well  Patient left in bed;with call bell/phone within reach;with bed alarm set;with family/visitor present  Nurse Communication Mobility status  CPM Right Knee  CPM Right Knee Off  Additional Comments Removed by PT at 1615   PT - Assessment/Plan  PT Visit Diagnosis Difficulty in walking, not elsewhere classified (R26.2)  PT Frequency (ACUTE ONLY) 7X/week  Follow Up Recommendations Follow physician's recommendations for discharge plan and follow up therapies  Patient can return home with the following A little help with walking and/or transfers;A little help with bathing/dressing/bathroom;Assistance with cooking/housework;Assist for transportation;Help with stairs or ramp for entrance  PT equipment Rolling walker (2 wheels)  AM-PAC PT "6 Clicks" Mobility Outcome Measure (Version 2)  Help needed turning from your back to your side while in a flat bed without using bedrails? 3  Help needed moving from lying  on your back to sitting on the side of a flat bed without using bedrails? 3  Help needed moving to and  from a bed to a chair (including a wheelchair)? 3  Help needed standing up from a chair using your arms (e.g., wheelchair or bedside chair)? 3  Help needed to walk in hospital room? 3  Help needed climbing 3-5 steps with a railing?  3  6 Click Score 18  Consider Recommendation of Discharge To: Home with [facility]  Progressive Mobility  What is the highest level of mobility based on the progressive mobility assessment? Level 5 (Walks with assist in room/hall) - Balance while stepping forward/back and can walk in room with assist - Complete  PT Goal Progression  Progress towards PT goals Progressing toward goals  Acute Rehab PT Goals  PT Goal Formulation With patient  Time For Goal Achievement 12/25/23  Potential to Achieve Goals Good  PT Time Calculation  PT Start Time (ACUTE ONLY) 1614  PT Stop Time (ACUTE ONLY) 1649  PT Time Calculation (min) (ACUTE ONLY) 35 min  PT General Charges  $$ ACUTE PT VISIT 1 Visit  PT Treatments  $Gait Training 8-22 mins  $Therapeutic Activity 8-22 mins

## 2023-12-19 NOTE — Progress Notes (Signed)
 Orthopedic Tech Progress Note Patient Details:  Joshua Mercado 09/19/1945 [id number]  CPM Right Knee CPM Right Knee: On Right Knee Flexion (Degrees): 75 Right Knee Extension (Degrees): 0 Additional Comments: Pt. c/o pain with 90 degree flexion. Backed down to 75 degree for tolerance.  Post Interventions Patient Tolerated: Well  Herbie Loll 12/19/2023, 2:47 PM

## 2023-12-19 NOTE — Progress Notes (Signed)
   Subjective:  Degan Hanser is a 78 y.o. male, 2 Days Post-Op    s/p Procedure(s): Right ARTHROPLASTY, KNEE, TOTAL   Patient reports pain as mild to moderate.  Denies n/t, calf pain SOB  Objective:   VITALS:   Vitals:   12/18/23 1356 12/18/23 2245 12/19/23 0653 12/19/23 0743  BP: (!) 106/59 (!) 135/52 (!) 148/70   Pulse: 93 98 98   Resp: 17 19 19    Temp: 98.4 F (36.9 C) 98.4 F (36.9 C) 97.7 F (36.5 C)   TempSrc:  Oral    SpO2: 93% 90% 92% 93%  Weight:      Height:       NAD  RLE:  Neurologically intact Neurovascular intact Sensation intact distally Intact pulses distally Dorsiflexion/Plantar flexion intact Incision: dressing C/D/I Compartment soft   Lab Results  Component Value Date   WBC 5.9 12/13/2023   HGB 15.0 12/13/2023   HCT 44.6 12/13/2023   MCV 95.5 12/13/2023   PLT 159 12/13/2023   BMET    Component Value Date/Time   NA 137 12/13/2023 1039   NA 138 08/27/2022 0000   K 4.2 12/13/2023 1039   CL 108 12/13/2023 1039   CO2 22 12/13/2023 1039   GLUCOSE 85 12/13/2023 1039   BUN 15 12/13/2023 1039   BUN 20 08/27/2022 0000   CREATININE 0.85 12/13/2023 1039   CREATININE 0.77 10/11/2023 0745   CALCIUM 9.3 12/13/2023 1039   EGFR 89 08/27/2022 0000   GFRNONAA >60 12/13/2023 1039     Assessment/Plan: 2 Days Post-Op   Principal Problem:   Status post total knee replacement, right   Advance diet Up with therapy D/c SNF, pending auth Aqu[employer] placed today.   Weightbearing Status: WBAT KI  DVT Prophylaxis: aspirin    Tiffane Sheldon D Dossie Swor 12/19/2023, 7:49 AM  Karyl Paget PA-C  Physician Assistant with Dr. Link Rice [place]

## 2023-12-19 NOTE — Progress Notes (Signed)
 Patient sleeping soundly at this time, earlier patient impulsively tried to get oob twice to use the bathroom without calling for assistance, bed alarm was activated and both times patient was found sitting on the edge of the bed with gown off and hearing aid found on the floor, patient was easily reoriented to place, time, and situation, assisted with needs, an returned to bed, door ajar for safety, will continue to monitor.

## 2023-12-19 NOTE — Plan of Care (Signed)

## 2023-12-20 ENCOUNTER — Telehealth: Payer: Self-pay | Admitting: Student

## 2023-12-20 DIAGNOSIS — K219 Gastro-esophageal reflux disease without esophagitis: Secondary | ICD-10-CM | POA: Diagnosis not present

## 2023-12-20 DIAGNOSIS — E7800 Pure hypercholesterolemia, unspecified: Secondary | ICD-10-CM | POA: Diagnosis not present

## 2023-12-20 DIAGNOSIS — Z79899 Other long term (current) drug therapy: Secondary | ICD-10-CM | POA: Diagnosis not present

## 2023-12-20 DIAGNOSIS — I493 Ventricular premature depolarization: Secondary | ICD-10-CM | POA: Diagnosis not present

## 2023-12-20 DIAGNOSIS — Z87891 Personal history of nicotine dependence: Secondary | ICD-10-CM | POA: Diagnosis not present

## 2023-12-20 DIAGNOSIS — D696 Thrombocytopenia, unspecified: Secondary | ICD-10-CM | POA: Diagnosis not present

## 2023-12-20 DIAGNOSIS — G4733 Obstructive sleep apnea (adult) (pediatric): Secondary | ICD-10-CM | POA: Diagnosis not present

## 2023-12-20 DIAGNOSIS — I498 Other specified cardiac arrhythmias: Secondary | ICD-10-CM | POA: Diagnosis not present

## 2023-12-20 DIAGNOSIS — I351 Nonrheumatic aortic (valve) insufficiency: Secondary | ICD-10-CM | POA: Diagnosis not present

## 2023-12-20 DIAGNOSIS — I10 Essential (primary) hypertension: Secondary | ICD-10-CM | POA: Diagnosis not present

## 2023-12-20 DIAGNOSIS — R1319 Other dysphagia: Secondary | ICD-10-CM | POA: Diagnosis not present

## 2023-12-20 DIAGNOSIS — Z471 Aftercare following joint replacement surgery: Secondary | ICD-10-CM | POA: Diagnosis not present

## 2023-12-20 DIAGNOSIS — Z96651 Presence of right artificial knee joint: Secondary | ICD-10-CM | POA: Diagnosis not present

## 2023-12-20 DIAGNOSIS — J449 Chronic obstructive pulmonary disease, unspecified: Secondary | ICD-10-CM | POA: Diagnosis not present

## 2023-12-20 DIAGNOSIS — K5900 Constipation, unspecified: Secondary | ICD-10-CM | POA: Diagnosis not present

## 2023-12-20 DIAGNOSIS — M1711 Unilateral primary osteoarthritis, right knee: Secondary | ICD-10-CM | POA: Diagnosis not present

## 2023-12-20 DIAGNOSIS — F32A Depression, unspecified: Secondary | ICD-10-CM | POA: Diagnosis not present

## 2023-12-20 MED ORDER — OXYCODONE-ACETAMINOPHEN 5-325 MG PO TABS: 1.0000 | ORAL_TABLET | ORAL | 0 refills | Status: DC | PRN

## 2023-12-20 NOTE — Discharge Summary (Signed)
 In most cases prophylactic antibiotics for Dental procdeures after total joint surgery are not necessary.  Exceptions are as follows:  1. History of prior total joint infection  2. Severely immunocompromised (Organ Transplant, cancer chemotherapy, Rheumatoid biologic meds such as Humera)  3. Poorly controlled diabetes (A1C &gt; 8.0, blood glucose over 200)  If you have one of these conditions, contact your surgeon for an antibiotic prescription, prior to your dental procedure. Orthopedic Discharge Summary        Physician Discharge Summary  Patient ID: Joshua Mercado MRN: [id number] DOB/AGE: [birth year] 78 y.o.  Admit date: 12/17/2023 Discharge date: 12/20/2023   Procedures:  Procedure(s) (LRB): ARTHROPLASTY, KNEE, TOTAL (Right)  Attending Physician:  Dr. Marionette Sick  Admission Diagnoses:   right knee end stage osteoarthritis  Discharge Diagnoses:  right knee end stage osteoarthritis   Past Medical History:  Diagnosis Date   Arthritis    Knees   COPD (chronic obstructive pulmonary disease) (HCC)    Does use hearing aid    Bilateral   Dysrhythmia    Enlarged prostate    GERD (gastroesophageal reflux disease)    Hypercholesterolemia    Hypertension    Sleep apnea     PCP: Verma Gobble, NP   Discharged Condition: good  Hospital Course:  Patient underwent the above stated procedure on 12/17/2023. Patient tolerated the procedure well and brought to the recovery room in good condition and subsequently to the floor. Patient had an uncomplicated hospital course and was stable for discharge.   Disposition: Discharge disposition: 03-Skilled Nursing Facility      with follow up in 2 weeks    Follow-up Information     Belton Boy, PA-C. Go on 12/29/2023.   Specialty: Physician Assistant Why: You are scheduled for a post op appointment 12/29/23 at 1:45pm Contact information: [address] [place] [address] [phone]          Winston Hawking, MD. Call in 2 week(s).   Specialty: Orthopedic Surgery Why: call [phone] for appt in two weeks Contact information: [address] [place] [address] [phone]                 Dental Antibiotics:  In most cases prophylactic antibiotics for Dental procdeures after total joint surgery are not necessary.  Exceptions are as follows:  1. History of prior total joint infection  2. Severely immunocompromised (Organ Transplant, cancer chemotherapy, Rheumatoid biologic meds such as Humera)  3. Poorly controlled diabetes (A1C &gt; 8.0, blood glucose over 200)  If you have one of these conditions, contact your surgeon for an antibiotic prescription, prior to your dental procedure.  Discharge Instructions     Call MD / Call 911   Complete by: As directed    If you experience chest pain or shortness of breath, CALL 911 and be transported to the hospital emergency room.  If you develope a fever above 101 F, pus (white drainage) or increased drainage or redness at the wound, or calf pain, call your surgeon's office.   Call MD / Call 911   Complete by: As directed    If you experience chest pain or shortness of breath, CALL 911 and be transported to the hospital emergency room.  If you develope a fever above 101 F, pus (white drainage) or increased drainage or redness at the wound, or calf pain, call your surgeon's office.   Constipation Prevention   Complete by: As directed    Drink  plenty of fluids.  Prune juice may be helpful.  You may use a stool softener, such as Colace (over the counter) 100 mg twice a day.  Use MiraLax  (over the counter) for constipation as needed.   Constipation Prevention   Complete by: As directed    Drink plenty of fluids.  Prune juice may be helpful.  You may use a stool softener, such as Colace (over the counter) 100 mg twice a day.  Use MiraLax  (over the counter) for constipation as needed.   Diet - low sodium  heart healthy   Complete by: As directed    Diet - low sodium heart healthy   Complete by: As directed    Increase activity slowly as tolerated   Complete by: As directed    Increase activity slowly as tolerated   Complete by: As directed    Post-operative opioid taper instructions:   Complete by: As directed    POST-OPERATIVE OPIOID TAPER INSTRUCTIONS: It is important to wean off of your opioid medication as soon as possible. If you do not need pain medication after your surgery it is ok to stop day one. Opioids include: Codeine, Hydrocodone(Norco, Vicodin), Oxycodone (Percocet, oxycontin ) and hydromorphone  amongst others.  Long term and even short term use of opiods can cause: Increased pain response Dependence Constipation Depression Respiratory depression And more.  Withdrawal symptoms can include Flu like symptoms Nausea, vomiting And more Techniques to manage these symptoms Hydrate well Eat regular healthy meals Stay active Use relaxation techniques(deep breathing, meditating, yoga) Do Not substitute Alcohol to help with tapering If you have been on opioids for less than two weeks and do not have pain than it is ok to stop all together.  Plan to wean off of opioids This plan should start within one week post op of your joint replacement. Maintain the same interval or time between taking each dose and first decrease the dose.  Cut the total daily intake of opioids by one tablet each day Next start to increase the time between doses. The last dose that should be eliminated is the evening dose.      Post-operative opioid taper instructions:   Complete by: As directed    POST-OPERATIVE OPIOID TAPER INSTRUCTIONS: It is important to wean off of your opioid medication as soon as possible. If you do not need pain medication after your surgery it is ok to stop day one. Opioids include: Codeine, Hydrocodone(Norco, Vicodin), Oxycodone (Percocet, oxycontin ) and hydromorphone   amongst others.  Long term and even short term use of opiods can cause: Increased pain response Dependence Constipation Depression Respiratory depression And more.  Withdrawal symptoms can include Flu like symptoms Nausea, vomiting And more Techniques to manage these symptoms Hydrate well Eat regular healthy meals Stay active Use relaxation techniques(deep breathing, meditating, yoga) Do Not substitute Alcohol to help with tapering If you have been on opioids for less than two weeks and do not have pain than it is ok to stop all together.  Plan to wean off of opioids This plan should start within one week post op of your joint replacement. Maintain the same interval or time between taking each dose and first decrease the dose.  Cut the total daily intake of opioids by one tablet each day Next start to increase the time between doses. The last dose that should be eliminated is the evening dose.          Allergies as of 12/20/2023       Reactions   Bee  Pollen Anaphylaxis   Bee Venom   Bee Venom    Throat/mouth swelling        Medication List     STOP taking these medications    OVER THE COUNTER MEDICATION   temazepam  15 MG capsule Commonly known as: RESTORIL        TAKE these medications    albuterol  (2.5 MG/3ML) 0.083% nebulizer solution Commonly known as: PROVENTIL  Take 3 mLs (2.5 mg total) by nebulization every 6 (six) hours as needed for wheezing or shortness of breath.   aspirin  81 MG chewable tablet Commonly known as: Aspirin  Childrens Chew 1 tablet (81 mg total) by mouth 2 (two) times daily.   cyanocobalamin  500 MCG tablet Commonly known as: VITAMIN B12 Take 500 mcg by mouth daily.   cyclobenzaprine  10 MG tablet Commonly known as: FLEXERIL  TAKE 1 TABLET BY MOUTH TWICE  DAILY AS NEEDED FOR MUSCLE  SPASM(S)   escitalopram  20 MG tablet Commonly known as: LEXAPRO  Take 0.5 tablets (10 mg total) by mouth daily.   finasteride  5 MG  tablet Commonly known as: PROSCAR  TAKE 1 TABLET EVERY DAY   fluticasone  50 MCG/ACT nasal spray Commonly known as: FLONASE  USE 1 SPRAY IN BOTH NOSTRILS DAILY   losartan  100 MG tablet Commonly known as: COZAAR  Take 1 tablet (100 mg total) by mouth daily.   Lotrisone  cream Generic drug: clotrimazole -betamethasone  Apply 1 Application topically 2 (two) times daily as needed.   melatonin 5 MG Tabs Take 5 mg by mouth at bedtime.   metoprolol  succinate 25 MG 24 hr tablet Commonly known as: TOPROL -XL Take 25 mg by mouth daily.   MSM 1000 MG Caps Take 1 capsule by mouth 2 (two) times daily.   Multivitamin Men 50+ Tabs Take 1 tablet by mouth daily.   ondansetron  4 MG tablet Commonly known as: Zofran  Take 1 tablet (4 mg total) by mouth every 8 (eight) hours as needed for refractory nausea / vomiting, vomiting or nausea.   oxyCODONE -acetaminophen  5-325 MG tablet Commonly known as: Percocet Take 1 tablet by mouth every 4 (four) hours as needed for severe pain (pain score 7-10).   pantoprazole  40 MG tablet Commonly known as: PROTONIX  TAKE 1 TABLET EVERY DAY   Prevagen 10 MG Caps Generic drug: Apoaequorin Take 1 capsule by mouth daily.   selenium  200 MCG Tabs tablet Take by mouth daily.   simvastatin  40 MG tablet Commonly known as: ZOCOR  Take 1 tablet (40 mg total) by mouth daily.   Spiriva  Respimat 2.5 MCG/ACT Aers Generic drug: Tiotropium Bromide  Monohydrate Inhale 2 puffs into the lungs daily.   tadalafil  20 MG tablet Commonly known as: CIALIS  Take 1 tablet (20 mg total) by mouth daily as needed for erectile dysfunction.   tamsulosin  0.4 MG Caps capsule Commonly known as: FLOMAX  TAKE 1 CAPSULE EVERY DAY   Testosterone  2 MG/24HR Pt24 Place 1 patch onto the skin as needed.   triamcinolone  cream 0.1 % Commonly known as: KENALOG  APPLY  CREAM EXTERNALLY TO AFFECTED AREA TWICE DAILY   vitamin C  1000 MG tablet Take 1,000 mg by mouth daily.   Vitamin D3 125 MCG  (5000 UT) Caps Take by mouth.          Signed: Corinthia Dickinson 12/20/2023, 7:14 AM  [facility] is now [employer] [address], [place], [place] [address] Phone: [phone] Facebook  Instagram  [employer]

## 2023-12-20 NOTE — Progress Notes (Signed)
   Subjective: 3 Days Post-Op Procedure(s) (LRB): ARTHROPLASTY, KNEE, TOTAL (Right)  Pt c/o mild to moderate pain but otherwise doing well Denies any new symptoms or issues Plan to go back to skilled part of his assisted living facility Patient reports pain as moderate.  Objective:   VITALS:   Vitals:   12/19/23 2255 12/20/23 0631  BP: 110/70 (!) 129/50  Pulse: 89 91  Resp: 18 19  Temp: 98.7 F (37.1 C) 98.4 F (36.9 C)  SpO2: 91% 98%    Left knee: well healing incision to anterior knee Nv intact distally No rashes or edema distally Gentle rom with some guarding  LABS No results for input(s): "HGB", "HCT", "WBC", "PLT" in the last 72 hours.  No results for input(s): "NA", "K", "BUN", "CREATININE", "GLUCOSE" in the last 72 hours.   Assessment/Plan: 3 Days Post-Op Procedure(s) (LRB): ARTHROPLASTY, KNEE, TOTAL (Right) D/c to SNF today after therapy Pain management Pulmonary toilet F/u in 2 weeks in the office   Brad Batsheva Stevick PA-C, MPAS [facility] is now [employer] [address], [address], [place], [place] [address] Phone: [phone] [email or web address] Facebook  [employer]

## 2023-12-20 NOTE — Progress Notes (Signed)
 Physical Therapy Treatment Patient Details Name: Sultan Pargas MRN: [id number] DOB: 09/15/1945 Today's Date: 12/20/2023   History of Present Illness Pt s/p R TKR and with hx of COPD and hearing loss    PT Comments  POD # 3 am session PT - Cognition Comments: AxO x 3 slightly slow/groggy requiring a little repeat instructions to stay on task Assisted OOB to amb.  General bed mobility comments: Increased time with cues for sequence and self assist. assist to safely elevate trunk and progress RLE off EOB using strap. General transfer comment: cues for LE management and use of UEs to self assist. assist to rise and transition to RW. pt beginning to demonstrate some carryover.  Amb 18 feet with assist.  Then returned to room to perform some TE's following HEP handout.  Instructed on proper tech, freq as well as use of ICE.   Addressed all mobility questions, discussed appropriate activity, educated on use of ICE.  Pt ready for D/C to SNF.  Sister present to transport.     If plan is discharge home, recommend the following: A little help with walking and/or transfers;A little help with bathing/dressing/bathroom;Assistance with cooking/housework;Assist for transportation;Help with stairs or ramp for entrance   Can travel by private vehicle        Equipment Recommendations  Rolling walker (2 wheels)    Recommendations for Other Services       Precautions / Restrictions Precautions Precaution/Restrictions Comments: no pillow under knee Restrictions Weight Bearing Restrictions Per Provider Order: No RLE Weight Bearing Per Provider Order: Weight bearing as tolerated     Mobility  Bed Mobility Overal bed mobility: Needs Assistance Bed Mobility: Supine to Sit     Supine to sit: Min assist     General bed mobility comments: Increased time with cues for sequence and self assist. assist to safely elevate trunk and progress RLE off EOB using strap    Transfers Overall transfer level: Needs  assistance Equipment used: Rolling walker (2 wheels) Transfers: Sit to/from Stand Sit to Stand: Min assist           General transfer comment: cues for LE management and use of UEs to self assist. assist to rise and transition to RW. pt beginning to demonstrate some carryover    Ambulation/Gait Ambulation/Gait assistance: Min assist, Contact guard assist Gait Distance (Feet): 18 Feet Assistive device: Rolling walker (2 wheels) Gait Pattern/deviations: Step-to pattern, Decreased step length - right, Decreased step length - left, Trunk flexed, Antalgic Gait velocity: decr     General Gait Details: cues for sequence, posture and position from RW, slow   Stairs             Wheelchair Mobility     Tilt Bed    Modified Rankin (Stroke Patients Only)       Balance                                            Communication Communication Communication: No apparent difficulties Factors Affecting Communication: Hearing impaired  Cognition Arousal: Alert Behavior During Therapy: WFL for tasks assessed/performed   PT - Cognitive impairments: No apparent impairments                       PT - Cognition Comments: AxO x 3 slightly slow/groggy requiring a little repeat instructions to stay on task Following commands:  Intact      Cueing Cueing Techniques: Verbal cues  Exercises  Total Knee Replacement TE's following HEP handout 10 reps B LE ankle pumps 05 reps towel squeezes 05 reps knee presses 05 reps heel slides  05 reps SAQ's 05 reps SLR's 05 reps ABD Educated on use of gait belt to assist with TE's Followed by ICE     General Comments        Pertinent Vitals/Pain Pain Assessment Pain Assessment: 0-10 Pain Score: 6  Pain Location: R knee 2/10 with amb and 6/10 with TE's Pain Descriptors / Indicators: Aching, Grimacing, Sore, Operative site guarding Pain Intervention(s): Monitored during session, Premedicated before session,  Repositioned, Ice applied    Home Living                          Prior Function            PT Goals (current goals can now be found in the care plan section) Progress towards PT goals: Progressing toward goals    Frequency    7X/week      PT Plan      Co-evaluation              AM-PAC PT "6 Clicks" Mobility   Outcome Measure  Help needed turning from your back to your side while in a flat bed without using bedrails?: A Little Help needed moving from lying on your back to sitting on the side of a flat bed without using bedrails?: A Little Help needed moving to and from a bed to a chair (including a wheelchair)?: A Little Help needed standing up from a chair using your arms (e.g., wheelchair or bedside chair)?: A Little Help needed to walk in hospital room?: A Lot Help needed climbing 3-5 steps with a railing? : A Lot 6 Click Score: 16    End of Session Equipment Utilized During Treatment: Gait belt Activity Tolerance: Patient tolerated treatment well;Patient limited by fatigue Patient left: in chair;with call bell/phone within reach;with chair alarm set;with family/visitor present Nurse Communication: Mobility status PT Visit Diagnosis: Difficulty in walking, not elsewhere classified (R26.2)     Time: 1610-9604 PT Time Calculation (min) (ACUTE ONLY): 30 min  Charges:    $Gait Training: 8-22 mins $Therapeutic Exercise: 8-22 mins PT General Charges $$ ACUTE PT VISIT: 1 Visit                     Bess Broody  PTA Acute  Rehabilitation Services Office M-F          [phone]

## 2023-12-20 NOTE — Telephone Encounter (Signed)
 Dc some supplements for while patient is in rehab. Consider discontinuation of MSM as well. Pain medication sent to pharmacy.

## 2023-12-20 NOTE — TOC Transition Note (Addendum)
 Transition of Care [facility]) - Discharge Note   Patient Details  Name: Joshua Mercado MRN: [id number] Date of Birth: 03/08/1946  Transition of Care [facility]) CM/SW Contact:  Bari Leys, RN Phone Number: 12/20/2023, 10:12 AM   Clinical Narrative:   Call received from [facility] with [facility], states she contact [place], [place] Cain Castillo, Investment banker, operational, confirmed [facility] is associated with patient's ILF, confirmed request for short term rehab will be approved and updated in [facility] portal.   -11:00am Short term rehab/SNF auth approved per [facility] Auth ID [id number] Auth ID [id number], days approved 12/20/2023-12/22/2023 Per bedside nurse, family to transport patient to facility, family at bedside.  No further TOC needs.   -11:26am Cain Castillo, admit coordinator, request transport after lunch, around 1:00, RM 117, Call report [phone], bedside nurse notified.      Final next level of care: Skilled Nursing Facility Barriers to Discharge: Insurance Authorization   Patient Goals and CMS Choice Patient states their goals for this hospitalization and ongoing recovery are:: To go to [employer] for [employer].gov Compare Post Acute Care list provided to:: Patient Choice offered to / list presented to : Patient [place] ownership interest in [facility].provided to:: Patient    Discharge Placement                       Discharge Plan and Services Additional resources added to the After Visit Summary for   In-house Referral: Clinical Social Work   Post Acute Care Choice: Skilled Nursing Facility          DME Arranged: Otho Blitz rolling DME Agency: [employer] Date DME Agency Contacted: 12/17/23     HH Arranged: NA HH Agency: NA        Social Drivers of Health (SDOH) Interventions SDOH Screenings   Food Insecurity: No Food Insecurity (12/17/2023)  Housing: Low Risk  (12/17/2023)  Transportation Needs: No Transportation  Needs (12/17/2023)  Utilities: Not At Risk (12/17/2023)  Depression (PHQ2-9): Low Risk  (10/14/2023)  Social Connections: Moderately Integrated (12/17/2023)  Tobacco Use: Medium Risk (12/17/2023)  Health Literacy: Unknown (04/21/2022)   Received from [facility], [facility]     Readmission Risk Interventions     No data to display

## 2023-12-20 NOTE — Plan of Care (Signed)

## 2023-12-22 ENCOUNTER — Non-Acute Institutional Stay (SKILLED_NURSING_FACILITY): Admitting: Student

## 2023-12-22 ENCOUNTER — Encounter: Payer: Self-pay | Admitting: Student

## 2023-12-22 DIAGNOSIS — I493 Ventricular premature depolarization: Secondary | ICD-10-CM | POA: Diagnosis not present

## 2023-12-22 DIAGNOSIS — F322 Major depressive disorder, single episode, severe without psychotic features: Secondary | ICD-10-CM

## 2023-12-22 DIAGNOSIS — I351 Nonrheumatic aortic (valve) insufficiency: Secondary | ICD-10-CM | POA: Diagnosis not present

## 2023-12-22 DIAGNOSIS — I10 Essential (primary) hypertension: Secondary | ICD-10-CM | POA: Diagnosis not present

## 2023-12-22 DIAGNOSIS — G4733 Obstructive sleep apnea (adult) (pediatric): Secondary | ICD-10-CM

## 2023-12-22 DIAGNOSIS — D696 Thrombocytopenia, unspecified: Secondary | ICD-10-CM

## 2023-12-22 DIAGNOSIS — J449 Chronic obstructive pulmonary disease, unspecified: Secondary | ICD-10-CM | POA: Diagnosis not present

## 2023-12-22 DIAGNOSIS — Z96651 Presence of right artificial knee joint: Secondary | ICD-10-CM | POA: Diagnosis not present

## 2023-12-22 DIAGNOSIS — I498 Other specified cardiac arrhythmias: Secondary | ICD-10-CM

## 2023-12-22 DIAGNOSIS — E782 Mixed hyperlipidemia: Secondary | ICD-10-CM

## 2023-12-22 DIAGNOSIS — R1319 Other dysphagia: Secondary | ICD-10-CM

## 2023-12-22 MED ORDER — OXYCODONE-ACETAMINOPHEN 5-325 MG PO TABS: 1.0000 | ORAL_TABLET | Freq: Three times a day (TID) | ORAL | 0 refills | Status: DC | PRN

## 2023-12-22 NOTE — Progress Notes (Signed)
 Provider:  Dr. Valrie Gehrig Location:  Other [place].  Nursing Home Room Number: [facility]. [place] [address] Place of Service:  SNF (31)  PCP: Verma Gobble, NP Patient Care Team: Verma Gobble, NP as PCP - General (Geriatric Medicine) Constancia Delton, MD as PCP - Cardiology (Cardiology)  Extended Emergency Contact Information Primary Emergency Contact: Zornes/Cunningham,Suellen  [place] Home Phone: [phone] Mobile Phone: [phone] Relation: Sister Secondary Emergency Contact: Corlett,Neddy  [place] Home Phone: [phone] Mobile Phone: [phone] Relation: Son  Code Status: Full Code.  Goals of Care: Advanced Directive information    12/17/2023    5:30 PM  Advanced Directives  Does Patient Have a Medical Advance Directive? Yes  Type of Advance Directive Healthcare Power of Attorney  Does patient want to make changes to medical advance directive? No - Patient declined      Chief Complaint  Patient presents with   New Admit To SNF    Admission    HPI: Patient is a 78 y.o. male seen today for admission to [facility] SNF History of Present Illness The patient presents for follow-up after recent knee replacement surgery.  Recently underwent knee replacement surgery without complications. Postoperative confusion occurred due to oxycodone  use, which has resolved. Currently taking oxycodone  every eight hours for pain, with pain levels at 1 or 2 out of 10 after medication.  Experiencing constipation with small bowel movements, a known side effect of oxycodone . He has a history of constipation related to oxycodone  and has used MiraLAX  and Senna for management.  Current medications include aspirin  81 mg twice daily, MSM, Spiriva  and albuterol  inhalers, Flomax , Lexapro  10 mg, vitamin C , vitamin D, Protonix , simvastatin , and occasionally Flexeril .  He has a living will and has expressed wishes regarding  resuscitation and hospitalization in case of severe illness. He has been in the hospital for three days post-surgery and is considering discharge soon, with plans for home therapy support.  No issues with urination. He is working with therapy to assess progress and manage self-care independently.    Past Medical History:  Diagnosis Date   Arthritis    Knees   COPD (chronic obstructive pulmonary disease) (HCC)    Does use hearing aid    Bilateral   Dysrhythmia    Enlarged prostate    GERD (gastroesophageal reflux disease)    Hypercholesterolemia    Hypertension    Sleep apnea    Past Surgical History:  Procedure Laterality Date   CHEEK AUGMENTATION Left 1970   REstructure of cheek bone    COLONOSCOPY  2017   Dr. Elsa Halls   COLONOSCOPY WITH PROPOFOL  N/A 05/05/2022   Procedure: COLONOSCOPY WITH PROPOFOL ;  Surgeon: Marnee Sink, MD;  Location: [facility] ENDOSCOPY;  Service: Endoscopy;  Laterality: N/A;   ESOPHAGOGASTRODUODENOSCOPY (EGD) WITH PROPOFOL  N/A 12/05/2020   Procedure: ESOPHAGOGASTRODUODENOSCOPY (EGD) WITH PROPOFOL ;  Surgeon: Marnee Sink, MD;  Location: [facility] ENDOSCOPY;  Service: Endoscopy;  Laterality: N/A;   EYE SURGERY Bilateral 2005   NASAL SEPTOPLASTY W/ TURBINOPLASTY Bilateral 12/24/2020   Procedure: NASAL SEPTOPLASTY WITH INFERIOR TURBINATE REDUCTION;  Surgeon: Rogers Clayman, MD;  Location: [facility] ORS;  Service: ENT;  Laterality: Bilateral;   TOTAL KNEE ARTHROPLASTY Right 12/17/2023   Procedure: ARTHROPLASTY, KNEE, TOTAL;  Surgeon: Winston Hawking, MD;  Location: [facility] ORS;  Service: Orthopedics;  Laterality: Right;   UPPER GI ENDOSCOPY      reports that he quit smoking about 40 years ago. His smoking use included cigarettes.  He started smoking about 75 years ago. He has a 35 pack-year smoking history. He has never used smokeless tobacco. He reports that he does not currently use alcohol. He reports that he does not use drugs. Social History   Socioeconomic History   Marital  status: Married    Spouse name: Not on file   Number of children: Not on file   Years of education: Not on file   Highest education level: Not on file  Occupational History   Not on file  Tobacco Use   Smoking status: Former    Current packs/day: 0.00    Average packs/day: 1 pack/day for 35.0 years (35.0 ttl pk-yrs)    Types: Cigarettes    Start date: 39    Quit date: 48    Years since quitting: 40.4   Smokeless tobacco: Never  Vaping Use   Vaping status: Never Used  Substance and Sexual Activity   Alcohol use: Not Currently   Drug use: Never   Sexual activity: Not on file  Other Topics Concern   Not on file  Social History Narrative   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  Wife is a retired Development worker, community.    Social Drivers of Corporate investment banker Strain: Not on file  Food Insecurity: No Food Insecurity (12/17/2023)   Hunger Vital Sign    Worried About Running Out of Food in the Last Year: Never true    Ran Out of Food in the Last Year: Never true  Transportation Needs: No Transportation Needs (12/17/2023)   PRAPARE - Administrator, Civil Service (Medical): No    Lack of Transportation (Non-Medical): No  Physical Activity: Not on file  Stress: Not on file  Social Connections: Moderately Integrated (12/17/2023)   Social Connection and Isolation Panel [NHANES]    Frequency of Communication with Friends and Family: More than three times a week    Frequency of Social Gatherings with Friends and Family: More than three times a week    Attends Religious Services: More than 4 times per year    Active Member of [employer] or Organizations: Yes    Attends Banker Meetings: More than 4 times per year    Marital Status: Widowed  Intimate Partner Violence: Not At Risk (12/17/2023)   Humiliation, Afraid, Rape, and Kick questionnaire    Fear of Current or Ex-Partner: No    Emotionally Abused: No    Physically Abused: No    Sexually Abused: No     Functional Status Survey:    Family History  Problem Relation Age of Onset   Alzheimer's disease Mother    Diabetes Mother    Heart failure Father    Heart attack Father    Diabetes Father    Dementia Father     Health Maintenance  Topic Date Due   COVID-19 Vaccine (38 - Moderna risk 2024-25 season) 08/25/2024 (Originally 10/19/2023)   INFLUENZA VACCINE  02/11/2024   Medicare Annual Wellness (AWV)  03/01/2024   Colonoscopy  05/06/2027   DTaP/Tdap/Td (3 - Td or Tdap) 12/20/2032   Pneumonia Vaccine 68+ Years old  Completed   Zoster Vaccines- Shingrix  Completed   HPV VACCINES  Aged Out   Meningococcal B Vaccine  Aged Out   Hepatitis C Screening  Discontinued    Allergies  Allergen Reactions   Bee Pollen Anaphylaxis    Bee Venom   Bee Venom     Throat/mouth swelling  Outpatient Encounter Medications as of 12/22/2023  Medication Sig   acetaminophen  (TYLENOL ) 650 MG CR tablet Take 650 mg by mouth 3 (three) times daily.   albuterol  (PROVENTIL ) (2.5 MG/3ML) 0.083% nebulizer solution Take 3 mLs (2.5 mg total) by nebulization every 6 (six) hours as needed for wheezing or shortness of breath.   aspirin  (ASPIRIN  CHILDRENS) 81 MG chewable tablet Chew 1 tablet (81 mg total) by mouth 2 (two) times daily.   calcium carbonate (TUMS - DOSED IN MG ELEMENTAL CALCIUM) 500 MG chewable tablet Chew 2 tablets by mouth every 4 (four) hours as needed for indigestion or heartburn.   Cholecalciferol (VITAMIN D3) 125 MCG (5000 UT) CAPS Take 1 capsule by mouth daily.   clotrimazole -betamethasone  (LOTRISONE ) cream Apply 1 Application topically 2 (two) times daily as needed.   cyanocobalamin  (VITAMIN B12) 500 MCG tablet Take 500 mcg by mouth daily.   cyclobenzaprine  (FLEXERIL ) 10 MG tablet TAKE 1 TABLET BY MOUTH TWICE  DAILY AS NEEDED FOR MUSCLE  SPASM(S)   escitalopram  (LEXAPRO ) 20 MG tablet Take 0.5 tablets (10 mg total) by mouth daily.   finasteride  (PROSCAR ) 5 MG tablet TAKE 1 TABLET EVERY DAY    fluticasone  (FLONASE ) 50 MCG/ACT nasal spray USE 1 SPRAY IN BOTH NOSTRILS DAILY   losartan  (COZAAR ) 100 MG tablet Take 1 tablet (100 mg total) by mouth daily.   melatonin 5 MG TABS Take 5 mg by mouth at bedtime.    Methylsulfonylmethane (MSM) 1000 MG CAPS Take 1 capsule by mouth 2 (two) times daily.   metoprolol  succinate (TOPROL -XL) 25 MG 24 hr tablet Take 25 mg by mouth daily.   Multiple Vitamins-Minerals (MULTIVITAMIN MEN 50+) TABS Take 1 tablet by mouth daily.   ondansetron  (ZOFRAN ) 4 MG tablet Take 1 tablet (4 mg total) by mouth every 8 (eight) hours as needed for refractory nausea / vomiting, vomiting or nausea.   oxyCODONE -acetaminophen  (PERCOCET) 5-325 MG tablet Take 1 tablet by mouth every 4 (four) hours as needed for severe pain (pain score 7-10). (Patient taking differently: Take 1 tablet by mouth every 4 (four) hours as needed for severe pain (pain score 7-10). Give One tablet by mouth three times daily)   OXYGEN Inhale into the lungs. 2lpm   pantoprazole  (PROTONIX ) 40 MG tablet TAKE 1 TABLET EVERY DAY   polyethylene glycol (MIRALAX  / GLYCOLAX ) 17 g packet Take 17 g by mouth daily.   senna (SENOKOT) 8.6 MG TABS tablet Take 2 tablets by mouth at bedtime.   simvastatin  (ZOCOR ) 40 MG tablet Take 1 tablet (40 mg total) by mouth daily.   tamsulosin  (FLOMAX ) 0.4 MG CAPS capsule TAKE 1 CAPSULE EVERY DAY   Tiotropium Bromide  Monohydrate (SPIRIVA  RESPIMAT) 2.5 MCG/ACT AERS Inhale 2 puffs into the lungs daily.   triamcinolone  cream (KENALOG ) 0.1 % APPLY  CREAM EXTERNALLY TO AFFECTED AREA TWICE DAILY   No facility-administered encounter medications on file as of 12/22/2023.    Review of Systems  Vitals:   12/22/23 0907  BP: (!) 116/58  Pulse: 77  Resp: 18  Temp: 97.8 F (36.6 C)  SpO2: 97%  Weight: 201 lb 9.6 oz (91.4 kg)  Height: 6' (1.829 m)   Body mass index is 27.34 kg/m. Physical Exam Constitutional:      Appearance: Normal appearance.  Cardiovascular:     Rate and  Rhythm: Normal rate and regular rhythm.     Pulses: Normal pulses.     Heart sounds: Normal heart sounds.  Pulmonary:     Effort: Pulmonary effort  is normal.  Abdominal:     General: Abdomen is flat. Bowel sounds are normal.     Palpations: Abdomen is soft.  Musculoskeletal:     Comments: : Knee with minimal swelling. Knee bandage clean, dry, and intact.  Skin:    General: Skin is warm and dry.  Neurological:     Mental Status: He is alert and oriented to person, place, and time.     Gait: Gait normal.  Psychiatric:        Mood and Affect: Mood normal.     Labs reviewed: Basic Metabolic Panel: Recent Labs    10/11/23 0745 12/13/23 1039  NA 140 137  K 3.9 4.2  CL 108 108  CO2 24 22  GLUCOSE 92 85  BUN 22 15  CREATININE 0.77 0.85  CALCIUM 9.4 9.3   Liver Function Tests: Recent Labs    10/11/23 0745  AST 17  ALT 16  BILITOT 1.1  PROT 6.8   No results for input(s): LIPASE, AMYLASE in the last 8760 hours. No results for input(s): AMMONIA in the last 8760 hours. CBC: Recent Labs    10/11/23 0745 12/13/23 1039  WBC 6.2 5.9  NEUTROABS 3,174  --   HGB 15.6 15.0  HCT 45.7 44.6  MCV 91.8 95.5  PLT 133* 159   Cardiac Enzymes: No results for input(s): CKTOTAL, CKMB, CKMBINDEX, TROPONINI in the last 8760 hours. BNP: Invalid input(s): POCBNP No results found for: HGBA1C No results found for: TSH No results found for: VITAMINB12 No results found for: FOLATE No results found for: IRON, TIBC, FERRITIN  Imaging and Procedures obtained prior to SNF admission: No results found.  Assessment/Plan Postoperative care following knee replacement Postoperative care following recent knee replacement surgery. No complications reported. Anesthesia-related confusion resolved. Pain managed with oxycodone . Progressing well with therapy and may be ready for discharge soon, depending on mobility and comfort with home care. Therapy will follow up at  home if discharged. - Continue physical therapy assessment and sessions - Monitor mobility and independence for potential discharge - Ensure home therapy follow-up if discharged  Pain management with oxycodone  Pain management post-knee replacement with oxycodone . Current regimen is every 8 hours. Reports pain level of 1-2 after taking oxycodone . Tylenol  added to bridge pain management. - Administer oxycodone  5 mg/325 mg every 8 hours - Schedule Tylenol  650 mg every 8 hours to bridge pain management  Constipation Constipation likely secondary to oxycodone  use. Bowel movements are occurring but are small and infrequent. MiraLAX  and Senna recommended to maintain regularity. - Administer MiraLAX  in the morning - Administer Senna at night to maintain regular bowel movements  Chronic obstructive pulmonary disease (COPD) COPD managed with Spiriva  and albuterol  inhalers.  Benign prostatic hyperplasia Benign prostatic hyperplasia managed with Flomax . No current issues reported with urination.  Depression Depression managed with Lexapro  10 mg daily.  Acid reflux Acid reflux managed with Protonix .  Hyperlipidemia Hyperlipidemia managed with simvastatin .  Goals of Care He has a living will and has expressed preferences for life-sustaining treatments. He wishes to receive chest compressions if found without a pulse and to return to the hospital if he becomes seriously ill. He has requested a 90-day trial of life-sustaining treatment before considering withdrawal.  Family/ staff Communication: Nursing  Labs/tests ordered: CBC, BMP

## 2023-12-23 LAB — CBC AND DIFFERENTIAL
HCT: 33 — AB (ref 41–53)
Hemoglobin: 11.1 — AB (ref 13.5–17.5)
Neutrophils Absolute: 2825
Platelets: 144 K/uL — AB (ref 150–400)
WBC: 5.1

## 2023-12-23 LAB — COMPREHENSIVE METABOLIC PANEL WITH GFR
Calcium: 8.9 (ref 8.7–10.7)
eGFR: 93

## 2023-12-23 LAB — BASIC METABOLIC PANEL WITH GFR
BUN: 18 (ref 4–21)
CO2: 27 — AB (ref 13–22)
Chloride: 105 (ref 99–108)
Creatinine: 0.7 (ref 0.6–1.3)
Glucose: 94
Potassium: 4.1 meq/L (ref 3.5–5.1)
Sodium: 139 (ref 137–147)

## 2023-12-23 LAB — CBC: RBC: 3.46 — AB (ref 3.87–5.11)

## 2023-12-27 DIAGNOSIS — Z471 Aftercare following joint replacement surgery: Secondary | ICD-10-CM | POA: Diagnosis not present

## 2023-12-27 DIAGNOSIS — R2689 Other abnormalities of gait and mobility: Secondary | ICD-10-CM | POA: Diagnosis not present

## 2023-12-27 DIAGNOSIS — M6281 Muscle weakness (generalized): Secondary | ICD-10-CM | POA: Diagnosis not present

## 2023-12-27 DIAGNOSIS — Z96651 Presence of right artificial knee joint: Secondary | ICD-10-CM | POA: Diagnosis not present

## 2023-12-28 DIAGNOSIS — M6281 Muscle weakness (generalized): Secondary | ICD-10-CM | POA: Diagnosis not present

## 2023-12-28 DIAGNOSIS — R2689 Other abnormalities of gait and mobility: Secondary | ICD-10-CM | POA: Diagnosis not present

## 2023-12-28 DIAGNOSIS — Z96651 Presence of right artificial knee joint: Secondary | ICD-10-CM | POA: Diagnosis not present

## 2023-12-28 DIAGNOSIS — Z471 Aftercare following joint replacement surgery: Secondary | ICD-10-CM | POA: Diagnosis not present

## 2023-12-29 DIAGNOSIS — Z4789 Encounter for other orthopedic aftercare: Secondary | ICD-10-CM | POA: Diagnosis not present

## 2023-12-30 ENCOUNTER — Ambulatory Visit: Admitting: Nurse Practitioner

## 2023-12-30 DIAGNOSIS — Z471 Aftercare following joint replacement surgery: Secondary | ICD-10-CM | POA: Diagnosis not present

## 2023-12-30 DIAGNOSIS — R2689 Other abnormalities of gait and mobility: Secondary | ICD-10-CM | POA: Diagnosis not present

## 2023-12-30 DIAGNOSIS — Z96651 Presence of right artificial knee joint: Secondary | ICD-10-CM | POA: Diagnosis not present

## 2023-12-30 DIAGNOSIS — M6281 Muscle weakness (generalized): Secondary | ICD-10-CM | POA: Diagnosis not present

## 2024-01-03 DIAGNOSIS — Z96651 Presence of right artificial knee joint: Secondary | ICD-10-CM | POA: Diagnosis not present

## 2024-01-03 DIAGNOSIS — R2689 Other abnormalities of gait and mobility: Secondary | ICD-10-CM | POA: Diagnosis not present

## 2024-01-03 DIAGNOSIS — Z471 Aftercare following joint replacement surgery: Secondary | ICD-10-CM | POA: Diagnosis not present

## 2024-01-03 DIAGNOSIS — M6281 Muscle weakness (generalized): Secondary | ICD-10-CM | POA: Diagnosis not present

## 2024-01-04 ENCOUNTER — Ambulatory Visit: Admitting: Nurse Practitioner

## 2024-01-04 ENCOUNTER — Encounter: Payer: Self-pay | Admitting: Nurse Practitioner

## 2024-01-04 VITALS — BP 118/64 | HR 62 | Temp 97.6°F | Resp 18 | Ht 72.0 in | Wt 192.8 lb

## 2024-01-04 DIAGNOSIS — M1711 Unilateral primary osteoarthritis, right knee: Secondary | ICD-10-CM | POA: Diagnosis not present

## 2024-01-04 DIAGNOSIS — J449 Chronic obstructive pulmonary disease, unspecified: Secondary | ICD-10-CM

## 2024-01-04 DIAGNOSIS — R1011 Right upper quadrant pain: Secondary | ICD-10-CM | POA: Diagnosis not present

## 2024-01-04 NOTE — Progress Notes (Signed)
 Careteam: Patient Care Team: Joshua Harlene POUR, NP as PCP - General (Geriatric Medicine) Darliss Rogue, MD as PCP - Cardiology (Cardiology) PLACE OF SERVICE:  [facility]   Advanced Directive information Does Patient Have a Medical Advance Directive?: Yes, Type of Advance Directive: Healthcare Power of Attorney, Does patient want to make changes to medical advance directive?: No - Patient declined  Allergies  Allergen Reactions   Bee Pollen Anaphylaxis    Bee Venom   Bee Venom     Throat/mouth swelling    Chief Complaint  Patient presents with   Chest Pain     HPI: Patient is a 78 y.o. male seen in today for follow up at [facility]  Discussed the use of AI scribe software for clinical note transcription with the patient, who gave verbal consent to proceed.  History of Present Illness Sanjeev Main is an 78 year old male with COPD who presents with right lower lung pain.  He underwent a right knee replacement on June 6th and subsequently went to [facility] for rehabilitation for about a week before returning home. He continues to engage in physical therapy and has had follow-up visits with his orthopedic surgeon. His pain level has decreased significantly, and he is currently managing it with Tylenol , having discontinued oxycodone . No issues with bowel movements, using Miralax  as needed, and no nausea or vomiting.  He experienced a sudden onset of intense pain in his lower right lung area, described as 'real intense' and lasting approximately ten minutes, occurring two days ago. The pain was rated between six to eight on a scale of ten and was located towards the back, more in the abdominal area. No shortness of breath, cough, fever, chills, or other respiratory symptoms. The pain resolved on its own without recurrence.  He has a history of COPD. No recent changes in bowel movements, urine frequency, or flow, and no palpitations, chest pain, or radiation of pain to the  shoulder. He has not experienced any dizziness, lightheadedness, or dark stools. His vital signs were checked by EMTs at the time of the incident and were reported to be normal.  He plans to go on vacation to [place]  in September with his sister and brother.   Review of Systems:  Review of Systems  Constitutional:  Negative for chills, fever and weight loss.  HENT:  Negative for tinnitus.   Respiratory:  Negative for cough, sputum production and shortness of breath.   Cardiovascular:  Negative for chest pain, palpitations and leg swelling.  Gastrointestinal:  Negative for abdominal pain, constipation, diarrhea and heartburn.  Genitourinary:  Negative for dysuria, frequency and urgency.  Musculoskeletal:  Negative for back pain, falls, joint pain and myalgias.  Skin: Negative.   Neurological:  Negative for dizziness and headaches.  Psychiatric/Behavioral:  Negative for depression and memory loss. The patient does not have insomnia.     Past Medical History:  Diagnosis Date   Arthritis    Knees   COPD (chronic obstructive pulmonary disease) (HCC)    Does use hearing aid    Bilateral   Dysrhythmia    Enlarged prostate    GERD (gastroesophageal reflux disease)    Hypercholesterolemia    Hypertension    Sleep apnea    Past Surgical History:  Procedure Laterality Date   CHEEK AUGMENTATION Left 1970   REstructure of cheek bone    COLONOSCOPY  2017   Dr. Nellie   COLONOSCOPY WITH PROPOFOL  N/A 05/05/2022   Procedure:  COLONOSCOPY WITH PROPOFOL ;  Surgeon: Jinny Carmine, MD;  Location: [facility] ENDOSCOPY;  Service: Endoscopy;  Laterality: N/A;   ESOPHAGOGASTRODUODENOSCOPY (EGD) WITH PROPOFOL  N/A 12/05/2020   Procedure: ESOPHAGOGASTRODUODENOSCOPY (EGD) WITH PROPOFOL ;  Surgeon: Jinny Carmine, MD;  Location: [facility] ENDOSCOPY;  Service: Endoscopy;  Laterality: N/A;   EYE SURGERY Bilateral 2005   NASAL SEPTOPLASTY W/ TURBINOPLASTY Bilateral 12/24/2020   Procedure: NASAL SEPTOPLASTY WITH INFERIOR  TURBINATE REDUCTION;  Surgeon: Milissa Hamming, MD;  Location: [facility] ORS;  Service: ENT;  Laterality: Bilateral;   TOTAL KNEE ARTHROPLASTY Right 12/17/2023   Procedure: ARTHROPLASTY, KNEE, TOTAL;  Surgeon: Kay Kemps, MD;  Location: [facility] ORS;  Service: Orthopedics;  Laterality: Right;   UPPER GI ENDOSCOPY     Social History:   reports that he quit smoking about 40 years ago. His smoking use included cigarettes. He started smoking about 75 years ago. He has a 35 pack-year smoking history. He has never used smokeless tobacco. He reports that he does not currently use alcohol. He reports that he does not use drugs.  Family History  Problem Relation Age of Onset   Alzheimer's disease Mother    Diabetes Mother    Heart failure Father    Heart attack Father    Diabetes Father    Dementia Father     Medications: Patient's Medications  New Prescriptions   No medications on file  Previous Medications   ACETAMINOPHEN  (TYLENOL ) 650 MG CR TABLET    Take 650 mg by mouth 3 (three) times daily.   ALBUTEROL  (PROVENTIL ) (2.5 MG/3ML) 0.083% NEBULIZER SOLUTION    Take 3 mLs (2.5 mg total) by nebulization every 6 (six) hours as needed for wheezing or shortness of breath.   ASPIRIN  (ASPIRIN  CHILDRENS) 81 MG CHEWABLE TABLET    Chew 1 tablet (81 mg total) by mouth 2 (two) times daily.   CALCIUM CARBONATE (TUMS - DOSED IN MG ELEMENTAL CALCIUM) 500 MG CHEWABLE TABLET    Chew 2 tablets by mouth every 4 (four) hours as needed for indigestion or heartburn.   CHOLECALCIFEROL (VITAMIN D3) 125 MCG (5000 UT) CAPS    Take 1 capsule by mouth daily.   CLOTRIMAZOLE -BETAMETHASONE  (LOTRISONE ) CREAM    Apply 1 Application topically 2 (two) times daily as needed.   CYANOCOBALAMIN  (VITAMIN B12) 500 MCG TABLET    Take 500 mcg by mouth daily.   CYCLOBENZAPRINE  (FLEXERIL ) 10 MG TABLET    TAKE 1 TABLET BY MOUTH TWICE  DAILY AS NEEDED FOR MUSCLE  SPASM(S)   ESCITALOPRAM  (LEXAPRO ) 20 MG TABLET    Take 0.5 tablets (10 mg total) by  mouth daily.   FINASTERIDE  (PROSCAR ) 5 MG TABLET    TAKE 1 TABLET EVERY DAY   FLUTICASONE  (FLONASE ) 50 MCG/ACT NASAL SPRAY    USE 1 SPRAY IN BOTH NOSTRILS DAILY   LOSARTAN  (COZAAR ) 100 MG TABLET    Take 1 tablet (100 mg total) by mouth daily.   MELATONIN 5 MG TABS    Take 5 mg by mouth at bedtime.    METHYLSULFONYLMETHANE (MSM) 1000 MG CAPS    Take 1 capsule by mouth 2 (two) times daily.   METOPROLOL  SUCCINATE (TOPROL -XL) 25 MG 24 HR TABLET    Take 25 mg by mouth daily.   MULTIPLE VITAMINS-MINERALS (MULTIVITAMIN MEN 50+) TABS    Take 1 tablet by mouth daily.   ONDANSETRON  (ZOFRAN ) 4 MG TABLET    Take 1 tablet (4 mg total) by mouth every 8 (eight) hours as needed for refractory nausea / vomiting, vomiting  or nausea.   OXYCODONE -ACETAMINOPHEN  (PERCOCET) 5-325 MG TABLET    Take 1 tablet by mouth every 8 (eight) hours as needed for severe pain (pain score 7-10).   OXYGEN    Inhale into the lungs. 2lpm   PANTOPRAZOLE  (PROTONIX ) 40 MG TABLET    TAKE 1 TABLET EVERY DAY   POLYETHYLENE GLYCOL (MIRALAX  / GLYCOLAX ) 17 G PACKET    Take 17 g by mouth daily.   SENNA (SENOKOT) 8.6 MG TABS TABLET    Take 2 tablets by mouth at bedtime.   SIMVASTATIN  (ZOCOR ) 40 MG TABLET    Take 1 tablet (40 mg total) by mouth daily.   TAMSULOSIN  (FLOMAX ) 0.4 MG CAPS CAPSULE    TAKE 1 CAPSULE EVERY DAY   TIOTROPIUM BROMIDE  MONOHYDRATE (SPIRIVA  RESPIMAT) 2.5 MCG/ACT AERS    Inhale 2 puffs into the lungs daily.   TRIAMCINOLONE  CREAM (KENALOG ) 0.1 %    APPLY  CREAM EXTERNALLY TO AFFECTED AREA TWICE DAILY  Modified Medications   No medications on file  Discontinued Medications   No medications on file    Physical Exam:  Vitals:   01/04/24 1053  BP: 118/64  Pulse: 62  Resp: 18  Temp: 97.6 F (36.4 C)  SpO2: 95%  Weight: 192 lb 12.8 oz (87.5 kg)  Height: 6' (1.829 m)   Body mass index is 26.15 kg/m. Wt Readings from Last 3 Encounters:  01/04/24 192 lb 12.8 oz (87.5 kg)  12/22/23 201 lb 9.6 oz (91.4 kg)  12/17/23  197 lb (89.4 kg)    Physical Exam Constitutional:      General: He is not in acute distress.    Appearance: He is well-developed. He is not diaphoretic.  HENT:     Head: Normocephalic and atraumatic.     Right Ear: External ear normal.     Left Ear: External ear normal.     Mouth/Throat:     Pharynx: No oropharyngeal exudate.   Eyes:     Conjunctiva/sclera: Conjunctivae normal.     Pupils: Pupils are equal, round, and reactive to light.    Cardiovascular:     Rate and Rhythm: Normal rate and regular rhythm.     Heart sounds: Normal heart sounds.  Pulmonary:     Effort: Pulmonary effort is normal.     Breath sounds: Normal breath sounds.  Abdominal:     General: Bowel sounds are normal.     Palpations: Abdomen is soft.   Musculoskeletal:        General: No tenderness.     Cervical back: Normal range of motion and neck supple.     Right lower leg: No edema.     Left lower leg: No edema.   Skin:    General: Skin is warm and dry.   Neurological:     Mental Status: He is alert and oriented to person, place, and time.     Labs reviewed: Basic Metabolic Panel: Recent Labs    10/11/23 0745 12/13/23 1039  NA 140 137  K 3.9 4.2  CL 108 108  CO2 24 22  GLUCOSE 92 85  BUN 22 15  CREATININE 0.77 0.85  CALCIUM 9.4 9.3   Liver Function Tests: Recent Labs    10/11/23 0745  AST 17  ALT 16  BILITOT 1.1  PROT 6.8   No results for input(s): LIPASE, AMYLASE in the last 8760 hours. No results for input(s): AMMONIA in the last 8760 hours. CBC: Recent Labs    10/11/23 0745  12/13/23 1039  WBC 6.2 5.9  NEUTROABS 3,174  --   HGB 15.6 15.0  HCT 45.7 44.6  MCV 91.8 95.5  PLT 133* 159   Lipid Panel: Recent Labs    10/11/23 0745  CHOL 195  HDL 48  LDLCALC 124*  TRIG 115  CHOLHDL 4.1   TSH: No results for input(s): TSH in the last 8760 hours. A1C: No results found for: HGBA1C   Assessment/Plan Assessment and Plan Assessment & Plan OA of  right knee S/p total knee replacement  Pain persists post-surgery, managed with Tylenol . Oxycodone  discontinued. Physical therapy progressing well. - Continue physical therapy. - Continue Tylenol  for pain management.  Abdominal pain Sudden intense pain to right uppder quadrant, likely abdominal, resolved spontaneously. Differential includes gas pain or gallbladder-related pain, - no ongoing symptoms or recurrence  Monitor for recurrence of pain or additional symptoms. - Consider imaging or further evaluation if pain recurs.  Chronic Obstructive Pulmonary Disease (COPD) COPD well-controlled, no recent exacerbations or symptoms.    Next appt: to keep follow up as schedule and PRN Gudelia Eugene K. Joshua BODILY  [facility] [phone]

## 2024-01-05 DIAGNOSIS — Z96651 Presence of right artificial knee joint: Secondary | ICD-10-CM | POA: Diagnosis not present

## 2024-01-05 DIAGNOSIS — Z471 Aftercare following joint replacement surgery: Secondary | ICD-10-CM | POA: Diagnosis not present

## 2024-01-05 DIAGNOSIS — M6281 Muscle weakness (generalized): Secondary | ICD-10-CM | POA: Diagnosis not present

## 2024-01-05 DIAGNOSIS — R2689 Other abnormalities of gait and mobility: Secondary | ICD-10-CM | POA: Diagnosis not present

## 2024-01-07 DIAGNOSIS — Z471 Aftercare following joint replacement surgery: Secondary | ICD-10-CM | POA: Diagnosis not present

## 2024-01-07 DIAGNOSIS — Z96651 Presence of right artificial knee joint: Secondary | ICD-10-CM | POA: Diagnosis not present

## 2024-01-07 DIAGNOSIS — M6281 Muscle weakness (generalized): Secondary | ICD-10-CM | POA: Diagnosis not present

## 2024-01-07 DIAGNOSIS — R2689 Other abnormalities of gait and mobility: Secondary | ICD-10-CM | POA: Diagnosis not present

## 2024-01-10 DIAGNOSIS — R2689 Other abnormalities of gait and mobility: Secondary | ICD-10-CM | POA: Diagnosis not present

## 2024-01-10 DIAGNOSIS — M6281 Muscle weakness (generalized): Secondary | ICD-10-CM | POA: Diagnosis not present

## 2024-01-10 DIAGNOSIS — Z471 Aftercare following joint replacement surgery: Secondary | ICD-10-CM | POA: Diagnosis not present

## 2024-01-10 DIAGNOSIS — Z96651 Presence of right artificial knee joint: Secondary | ICD-10-CM | POA: Diagnosis not present

## 2024-01-12 DIAGNOSIS — R2689 Other abnormalities of gait and mobility: Secondary | ICD-10-CM | POA: Diagnosis not present

## 2024-01-12 DIAGNOSIS — Z471 Aftercare following joint replacement surgery: Secondary | ICD-10-CM | POA: Diagnosis not present

## 2024-01-12 DIAGNOSIS — Z96651 Presence of right artificial knee joint: Secondary | ICD-10-CM | POA: Diagnosis not present

## 2024-01-12 DIAGNOSIS — M6281 Muscle weakness (generalized): Secondary | ICD-10-CM | POA: Diagnosis not present

## 2024-01-14 DIAGNOSIS — R2689 Other abnormalities of gait and mobility: Secondary | ICD-10-CM | POA: Diagnosis not present

## 2024-01-14 DIAGNOSIS — M6281 Muscle weakness (generalized): Secondary | ICD-10-CM | POA: Diagnosis not present

## 2024-01-14 DIAGNOSIS — Z96651 Presence of right artificial knee joint: Secondary | ICD-10-CM | POA: Diagnosis not present

## 2024-01-14 DIAGNOSIS — Z471 Aftercare following joint replacement surgery: Secondary | ICD-10-CM | POA: Diagnosis not present

## 2024-01-17 DIAGNOSIS — M6281 Muscle weakness (generalized): Secondary | ICD-10-CM | POA: Diagnosis not present

## 2024-01-17 DIAGNOSIS — R2689 Other abnormalities of gait and mobility: Secondary | ICD-10-CM | POA: Diagnosis not present

## 2024-01-17 DIAGNOSIS — Z471 Aftercare following joint replacement surgery: Secondary | ICD-10-CM | POA: Diagnosis not present

## 2024-01-17 DIAGNOSIS — Z96651 Presence of right artificial knee joint: Secondary | ICD-10-CM | POA: Diagnosis not present

## 2024-01-18 ENCOUNTER — Other Ambulatory Visit: Payer: Self-pay | Admitting: Nurse Practitioner

## 2024-01-18 DIAGNOSIS — F419 Anxiety disorder, unspecified: Secondary | ICD-10-CM

## 2024-01-18 DIAGNOSIS — F4321 Adjustment disorder with depressed mood: Secondary | ICD-10-CM

## 2024-01-18 MED ORDER — ESCITALOPRAM OXALATE 20 MG PO TABS: 10.0000 mg | ORAL_TABLET | Freq: Every day | ORAL | 1 refills | Status: AC

## 2024-01-18 NOTE — Telephone Encounter (Signed)
 Pharmacy requested refill

## 2024-01-19 DIAGNOSIS — M6281 Muscle weakness (generalized): Secondary | ICD-10-CM | POA: Diagnosis not present

## 2024-01-19 DIAGNOSIS — Z471 Aftercare following joint replacement surgery: Secondary | ICD-10-CM | POA: Diagnosis not present

## 2024-01-19 DIAGNOSIS — Z96651 Presence of right artificial knee joint: Secondary | ICD-10-CM | POA: Diagnosis not present

## 2024-01-19 DIAGNOSIS — R2689 Other abnormalities of gait and mobility: Secondary | ICD-10-CM | POA: Diagnosis not present

## 2024-01-19 DIAGNOSIS — M1712 Unilateral primary osteoarthritis, left knee: Secondary | ICD-10-CM | POA: Diagnosis not present

## 2024-01-20 DIAGNOSIS — Z471 Aftercare following joint replacement surgery: Secondary | ICD-10-CM | POA: Diagnosis not present

## 2024-01-20 DIAGNOSIS — R2689 Other abnormalities of gait and mobility: Secondary | ICD-10-CM | POA: Diagnosis not present

## 2024-01-20 DIAGNOSIS — M6281 Muscle weakness (generalized): Secondary | ICD-10-CM | POA: Diagnosis not present

## 2024-01-20 DIAGNOSIS — Z96651 Presence of right artificial knee joint: Secondary | ICD-10-CM | POA: Diagnosis not present

## 2024-01-21 DIAGNOSIS — Z471 Aftercare following joint replacement surgery: Secondary | ICD-10-CM | POA: Diagnosis not present

## 2024-01-21 DIAGNOSIS — M6281 Muscle weakness (generalized): Secondary | ICD-10-CM | POA: Diagnosis not present

## 2024-01-21 DIAGNOSIS — Z96651 Presence of right artificial knee joint: Secondary | ICD-10-CM | POA: Diagnosis not present

## 2024-01-21 DIAGNOSIS — R2689 Other abnormalities of gait and mobility: Secondary | ICD-10-CM | POA: Diagnosis not present

## 2024-01-26 DIAGNOSIS — R2689 Other abnormalities of gait and mobility: Secondary | ICD-10-CM | POA: Diagnosis not present

## 2024-01-26 DIAGNOSIS — M1712 Unilateral primary osteoarthritis, left knee: Secondary | ICD-10-CM | POA: Diagnosis not present

## 2024-01-26 DIAGNOSIS — Z96651 Presence of right artificial knee joint: Secondary | ICD-10-CM | POA: Diagnosis not present

## 2024-01-26 DIAGNOSIS — M6281 Muscle weakness (generalized): Secondary | ICD-10-CM | POA: Diagnosis not present

## 2024-01-26 DIAGNOSIS — Z471 Aftercare following joint replacement surgery: Secondary | ICD-10-CM | POA: Diagnosis not present

## 2024-01-27 DIAGNOSIS — M6281 Muscle weakness (generalized): Secondary | ICD-10-CM | POA: Diagnosis not present

## 2024-01-27 DIAGNOSIS — Z471 Aftercare following joint replacement surgery: Secondary | ICD-10-CM | POA: Diagnosis not present

## 2024-01-27 DIAGNOSIS — Z96651 Presence of right artificial knee joint: Secondary | ICD-10-CM | POA: Diagnosis not present

## 2024-01-27 DIAGNOSIS — R2689 Other abnormalities of gait and mobility: Secondary | ICD-10-CM | POA: Diagnosis not present

## 2024-01-28 DIAGNOSIS — Z96651 Presence of right artificial knee joint: Secondary | ICD-10-CM | POA: Diagnosis not present

## 2024-01-28 DIAGNOSIS — Z471 Aftercare following joint replacement surgery: Secondary | ICD-10-CM | POA: Diagnosis not present

## 2024-01-28 DIAGNOSIS — M6281 Muscle weakness (generalized): Secondary | ICD-10-CM | POA: Diagnosis not present

## 2024-01-28 DIAGNOSIS — R2689 Other abnormalities of gait and mobility: Secondary | ICD-10-CM | POA: Diagnosis not present

## 2024-01-31 DIAGNOSIS — M6281 Muscle weakness (generalized): Secondary | ICD-10-CM | POA: Diagnosis not present

## 2024-01-31 DIAGNOSIS — Z471 Aftercare following joint replacement surgery: Secondary | ICD-10-CM | POA: Diagnosis not present

## 2024-01-31 DIAGNOSIS — Z96651 Presence of right artificial knee joint: Secondary | ICD-10-CM | POA: Diagnosis not present

## 2024-01-31 DIAGNOSIS — R2689 Other abnormalities of gait and mobility: Secondary | ICD-10-CM | POA: Diagnosis not present

## 2024-02-02 ENCOUNTER — Telehealth: Payer: Self-pay

## 2024-02-02 ENCOUNTER — Telehealth: Payer: Self-pay | Admitting: Cardiology

## 2024-02-02 DIAGNOSIS — M6281 Muscle weakness (generalized): Secondary | ICD-10-CM | POA: Diagnosis not present

## 2024-02-02 DIAGNOSIS — Z96651 Presence of right artificial knee joint: Secondary | ICD-10-CM | POA: Diagnosis not present

## 2024-02-02 DIAGNOSIS — Z4789 Encounter for other orthopedic aftercare: Secondary | ICD-10-CM | POA: Diagnosis not present

## 2024-02-02 DIAGNOSIS — Z471 Aftercare following joint replacement surgery: Secondary | ICD-10-CM | POA: Diagnosis not present

## 2024-02-02 DIAGNOSIS — R2689 Other abnormalities of gait and mobility: Secondary | ICD-10-CM | POA: Diagnosis not present

## 2024-02-02 DIAGNOSIS — M1712 Unilateral primary osteoarthritis, left knee: Secondary | ICD-10-CM | POA: Diagnosis not present

## 2024-02-02 NOTE — Telephone Encounter (Signed)
   Name: Joshua Mercado  DOB: 1946/05/05  MRN: [id number]  Primary Cardiologist: Redell Cave, MD   Preoperative team, please contact this patient and set up a phone call appointment for further preoperative risk assessment. Please obtain consent and complete medication review. Thank you for your help. Last seen by Tylene Lunch, NP on 09/02/2023  I confirm that guidance regarding antiplatelet and oral anticoagulation therapy has been completed and, if necessary, noted below.  Patient is not on anticoagulation or antiplatelet per review of medical record in Epic.    I also confirmed the patient resides in the state of [place] . As per [facility] Medical Board telemedicine laws, the patient must reside in the state in which the provider is licensed.   Lamarr Satterfield, NP 02/02/2024, 1:38 PM [facility]

## 2024-02-02 NOTE — Telephone Encounter (Signed)
 Tried contacting patient to schedule TELEVISIT no answer left a vm to call back and reschedule

## 2024-02-02 NOTE — Telephone Encounter (Signed)
 Patient has been scheduled for televisit med rec and consent done     Patient Consent for Virtual Visit         Joshua Mercado has provided verbal consent on 02/02/2024 for a virtual visit (video or telephone).   CONSENT FOR VIRTUAL VISIT FOR:  Joshua Mercado  By participating in this virtual visit I agree to the following:  I hereby voluntarily request, consent and authorize [facility] and its employed or contracted physicians, physician assistants, nurse practitioners or other licensed health care professionals (the Practitioner), to provide me with telemedicine health care services (the "Services) as deemed necessary by the treating Practitioner. I acknowledge and consent to receive the Services by the Practitioner via telemedicine. I understand that the telemedicine visit will involve communicating with the Practitioner through live audiovisual communication technology and the disclosure of certain medical information by electronic transmission. I acknowledge that I have been given the opportunity to request an in-person assessment or other available alternative prior to the telemedicine visit and am voluntarily participating in the telemedicine visit.  I understand that I have the right to withhold or withdraw my consent to the use of telemedicine in the course of my care at any time, without affecting my right to future care or treatment, and that the Practitioner or I may terminate the telemedicine visit at any time. I understand that I have the right to inspect all information obtained and/or recorded in the course of the telemedicine visit and may receive copies of available information for a reasonable fee.  I understand that some of the potential risks of receiving the Services via telemedicine include:  Delay or interruption in medical evaluation due to technological equipment failure or disruption; Information transmitted may not be sufficient (e.g. poor resolution of images) to allow  for appropriate medical decision making by the Practitioner; and/or  In rare instances, security protocols could fail, causing a breach of personal health information.  Furthermore, I acknowledge that it is my responsibility to provide information about my medical history, conditions and care that is complete and accurate to the best of my ability. I acknowledge that Practitioner's advice, recommendations, and/or decision may be based on factors not within their control, such as incomplete or inaccurate data provided by me or distortions of diagnostic images or specimens that may result from electronic transmissions. I understand that the practice of medicine is not an exact science and that Practitioner makes no warranties or guarantees regarding treatment outcomes. I acknowledge that a copy of this consent can be made available to me via my patient portal [facility] MyChart), or I can request a printed copy by calling the office of [facility].    I understand that my insurance will be billed for this visit.   I have read or had this consent read to me. I understand the contents of this consent, which adequately explains the benefits and risks of the Services being provided via telemedicine.  I have been provided ample opportunity to ask questions regarding this consent and the Services and have had my questions answered to my satisfaction. I give my informed consent for the services to be provided through the use of telemedicine in my medical care

## 2024-02-02 NOTE — Telephone Encounter (Signed)
   Pre-operative Risk Assessment    Patient Name: Joshua Mercado  DOB: 16-Dec-1945 MRN: [id number]   Date of last office visit: 09/02/23 Date of next office visit: N/A   Request for Surgical Clearance    Procedure:  Left Total Knee Arthroplasty  Date of Surgery:  Clearance TBD                                Surgeon:  Dr. Elspeth Her Surgeon's Group or Practice Name:  [facility] Phone number:  [phone] Fax number:  [phone]   Type of Clearance Requested:   - Medical    Type of Anesthesia:  Choice   Additional requests/questions:    SignedArsenio Celine GAILS   02/02/2024, 11:46 AM

## 2024-02-02 NOTE — Telephone Encounter (Signed)
 Patient has been for televisit

## 2024-02-03 DIAGNOSIS — Z471 Aftercare following joint replacement surgery: Secondary | ICD-10-CM | POA: Diagnosis not present

## 2024-02-03 DIAGNOSIS — Z96651 Presence of right artificial knee joint: Secondary | ICD-10-CM | POA: Diagnosis not present

## 2024-02-03 DIAGNOSIS — M6281 Muscle weakness (generalized): Secondary | ICD-10-CM | POA: Diagnosis not present

## 2024-02-03 DIAGNOSIS — R2689 Other abnormalities of gait and mobility: Secondary | ICD-10-CM | POA: Diagnosis not present

## 2024-02-04 DIAGNOSIS — Z96651 Presence of right artificial knee joint: Secondary | ICD-10-CM | POA: Diagnosis not present

## 2024-02-04 DIAGNOSIS — Z471 Aftercare following joint replacement surgery: Secondary | ICD-10-CM | POA: Diagnosis not present

## 2024-02-04 DIAGNOSIS — M6281 Muscle weakness (generalized): Secondary | ICD-10-CM | POA: Diagnosis not present

## 2024-02-04 DIAGNOSIS — R2689 Other abnormalities of gait and mobility: Secondary | ICD-10-CM | POA: Diagnosis not present

## 2024-02-09 ENCOUNTER — Ambulatory Visit: Attending: Cardiology | Admitting: Student

## 2024-02-09 DIAGNOSIS — Z01810 Encounter for preprocedural cardiovascular examination: Secondary | ICD-10-CM

## 2024-02-09 NOTE — Progress Notes (Signed)
 Virtual Visit via Telephone Note   Because of Joshua Mercado's co-morbid illnesses, he is at least at moderate risk for complications without adequate follow up.  This format is felt to be most appropriate for this patient at this time.  The patient did not have access to video technology/had technical difficulties with video requiring transitioning to audio format only (telephone).  All issues noted in this document were discussed and addressed.  No physical exam could be performed with this format.  Please refer to the patient's chart for his consent to telehealth for [facility].  Evaluation Performed:  Preoperative cardiovascular risk assessment _____________   Date:  02/09/2024   Patient ID:  Joshua Mercado, DOB 10-09-1945, MRN [id number] Patient Location:  Home Provider location:   Office  Primary Care Provider:  Caro Harlene POUR, NP Primary Cardiologist:  Redell Cave, MD  Chief Complaint / Patient Profile   78 y.o. y/o male with a h/o PVCs, hypertension, hyperlipidemia, COPD, OSA who is pending left total knee arthroplasty by Dr. Kay and presents today for telephonic preoperative cardiovascular risk assessment.  History of Present Illness    Joshua Mercado is a 78 y.o. male who presents via audio/video conferencing for a telehealth visit today.  Pt was last seen in cardiology clinic on 09/02/2023 by Tylene Lunch, NP.  At that time Kamarie Palma was stable from a cardiac standpoint.  The patient is now pending procedure as outlined above. Since his last visit, he is doing well. Patient denies shortness of breath, dyspnea on exertion, lower extremity edema, orthopnea or PND. No chest pain, pressure, or tightness. No palpitations. He had his right knee replaced in June and has recovered well from that surgery. 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   Past Medical History    Past Medical History:  Diagnosis Date   Arthritis    Knees   COPD  (chronic obstructive pulmonary disease) (HCC)    Does use hearing aid    Bilateral   Dysrhythmia    Enlarged prostate    GERD (gastroesophageal reflux disease)    Hypercholesterolemia    Hypertension    Sleep apnea    Past Surgical History:  Procedure Laterality Date   CHEEK AUGMENTATION Left 1970   REstructure of cheek bone    COLONOSCOPY  2017   Dr. Nellie   COLONOSCOPY WITH PROPOFOL  N/A 05/05/2022   Procedure: COLONOSCOPY WITH PROPOFOL ;  Surgeon: Jinny Carmine, MD;  Location: [facility] ENDOSCOPY;  Service: Endoscopy;  Laterality: N/A;   ESOPHAGOGASTRODUODENOSCOPY (EGD) WITH PROPOFOL  N/A 12/05/2020   Procedure: ESOPHAGOGASTRODUODENOSCOPY (EGD) WITH PROPOFOL ;  Surgeon: Jinny Carmine, MD;  Location: [facility] ENDOSCOPY;  Service: Endoscopy;  Laterality: N/A;   EYE SURGERY Bilateral 2005   NASAL SEPTOPLASTY W/ TURBINOPLASTY Bilateral 12/24/2020   Procedure: NASAL SEPTOPLASTY WITH INFERIOR TURBINATE REDUCTION;  Surgeon: Milissa Hamming, MD;  Location: [facility] ORS;  Service: ENT;  Laterality: Bilateral;   TOTAL KNEE ARTHROPLASTY Right 12/17/2023   Procedure: ARTHROPLASTY, KNEE, TOTAL;  Surgeon: Kay Kemps, MD;  Location: [facility] ORS;  Service: Orthopedics;  Laterality: Right;   UPPER GI ENDOSCOPY      Allergies  Allergies  Allergen Reactions   Bee Pollen Anaphylaxis    Bee Venom   Bee Venom     Throat/mouth swelling    Home Medications    Prior to Admission medications   Medication Sig Start Date End Date Taking? Authorizing Provider  acetaminophen  (TYLENOL ) 650 MG CR tablet Take 650  mg by mouth 3 (three) times daily.    [provider]  albuterol  (PROVENTIL ) (2.5 MG/3ML) 0.083% nebulizer solution Take 3 mLs (2.5 mg total) by nebulization every 6 (six) hours as needed for wheezing or shortness of breath. 10/08/22   Caro Harlene POUR, NP  calcium carbonate (TUMS - DOSED IN MG ELEMENTAL CALCIUM) 500 MG chewable tablet Chew 2 tablets by mouth every 4 (four) hours as needed for indigestion  or heartburn.    [provider]  Cholecalciferol (VITAMIN D3) 125 MCG (5000 UT) CAPS Take 1 capsule by mouth daily.    [provider]  clotrimazole -betamethasone  (LOTRISONE ) cream Apply 1 Application topically 2 (two) times daily as needed.    [provider]  cyanocobalamin  (VITAMIN B12) 500 MCG tablet Take 500 mcg by mouth daily.    [provider]  cyclobenzaprine  (FLEXERIL ) 10 MG tablet TAKE 1 TABLET BY MOUTH TWICE  DAILY AS NEEDED FOR MUSCLE  SPASM(S) 07/14/22   Caro Harlene POUR, NP  escitalopram  (LEXAPRO ) 20 MG tablet Take 0.5 tablets (10 mg total) by mouth daily. 01/18/24   Caro Harlene POUR, NP  finasteride  (PROSCAR ) 5 MG tablet TAKE 1 TABLET EVERY DAY 10/14/23   Eubanks, Jessica K, NP  fluticasone  (FLONASE ) 50 MCG/ACT nasal spray USE 1 SPRAY IN BOTH NOSTRILS DAILY 05/20/23   Eubanks, Jessica K, NP  losartan  (COZAAR ) 100 MG tablet Take 1 tablet (100 mg total) by mouth daily. 10/14/23   Eubanks, Jessica K, NP  melatonin 5 MG TABS Take 5 mg by mouth at bedtime.     [provider]  Methylsulfonylmethane (MSM) 1000 MG CAPS Take 1 capsule by mouth 2 (two) times daily.    [provider]  metoprolol  succinate (TOPROL -XL) 25 MG 24 hr tablet Take 25 mg by mouth daily.    [provider]  Multiple Vitamins-Minerals (MULTIVITAMIN MEN 50+) TABS Take 1 tablet by mouth daily.    [provider]  pantoprazole  (PROTONIX ) 40 MG tablet TAKE 1 TABLET EVERY DAY 11/15/23   Eubanks, Jessica K, NP  polyethylene glycol (MIRALAX  / GLYCOLAX ) 17 g packet Take 17 g by mouth daily.    [provider]  simvastatin  (ZOCOR ) 40 MG tablet Take 1 tablet (40 mg total) by mouth daily. 10/14/23   Caro Harlene POUR, NP  tamsulosin  (FLOMAX ) 0.4 MG CAPS capsule TAKE 1 CAPSULE EVERY DAY 10/14/23   Eubanks, Jessica K, NP  Tiotropium Bromide  Monohydrate (SPIRIVA  RESPIMAT) 2.5 MCG/ACT AERS Inhale 2 puffs into the lungs daily. 09/08/22   Parrett, Madelin RAMAN, NP   triamcinolone  cream (KENALOG ) 0.1 % APPLY  CREAM EXTERNALLY TO AFFECTED AREA TWICE DAILY 05/01/21   Caro Harlene POUR, NP    Physical Exam    Vital Signs:  Aaidyn San does not have vital signs available for review today.  Given telephonic nature of communication, physical exam is limited.  Assessment & Plan    Primary Cardiologist: Redell Cave, MD  Preoperative cardiovascular risk assessment.  Left total knee arthroplasty by Dr. Kay  Chart reviewed as part of pre-operative protocol coverage. According to the RCRI, patient has a 0.4% risk of MACE. Patient reports activity equivalent to 4.0 METS (works with occupational and physical therapy 3 days a week).   Given past medical history and time since last visit, based on ACC/AHA guidelines, Taichi Repka would be at acceptable risk for the planned procedure without further cardiovascular testing.   Patient was advised that if he develops new symptoms prior to surgery to contact  our office to arrange a follow-up appointment.  he verbalized understanding.  I will route this recommendation to the requesting party via Epic fax function.  Please call with questions.  Time:   Today, I have spent 5 minutes with the patient with telehealth technology discussing medical history, symptoms, and management plan.     Barnie Hila, NP  02/09/2024, 7:46 AM

## 2024-02-14 DIAGNOSIS — M6281 Muscle weakness (generalized): Secondary | ICD-10-CM | POA: Diagnosis not present

## 2024-02-14 DIAGNOSIS — Z96651 Presence of right artificial knee joint: Secondary | ICD-10-CM | POA: Diagnosis not present

## 2024-02-14 DIAGNOSIS — R2689 Other abnormalities of gait and mobility: Secondary | ICD-10-CM | POA: Diagnosis not present

## 2024-02-14 DIAGNOSIS — Z471 Aftercare following joint replacement surgery: Secondary | ICD-10-CM | POA: Diagnosis not present

## 2024-02-16 DIAGNOSIS — M6281 Muscle weakness (generalized): Secondary | ICD-10-CM | POA: Diagnosis not present

## 2024-02-16 DIAGNOSIS — Z471 Aftercare following joint replacement surgery: Secondary | ICD-10-CM | POA: Diagnosis not present

## 2024-02-16 DIAGNOSIS — Z96651 Presence of right artificial knee joint: Secondary | ICD-10-CM | POA: Diagnosis not present

## 2024-02-16 DIAGNOSIS — R2689 Other abnormalities of gait and mobility: Secondary | ICD-10-CM | POA: Diagnosis not present

## 2024-02-17 DIAGNOSIS — R2689 Other abnormalities of gait and mobility: Secondary | ICD-10-CM | POA: Diagnosis not present

## 2024-02-17 DIAGNOSIS — Z96651 Presence of right artificial knee joint: Secondary | ICD-10-CM | POA: Diagnosis not present

## 2024-02-17 DIAGNOSIS — M6281 Muscle weakness (generalized): Secondary | ICD-10-CM | POA: Diagnosis not present

## 2024-02-17 DIAGNOSIS — Z471 Aftercare following joint replacement surgery: Secondary | ICD-10-CM | POA: Diagnosis not present

## 2024-02-22 DIAGNOSIS — M6281 Muscle weakness (generalized): Secondary | ICD-10-CM | POA: Diagnosis not present

## 2024-02-22 DIAGNOSIS — R2689 Other abnormalities of gait and mobility: Secondary | ICD-10-CM | POA: Diagnosis not present

## 2024-02-22 DIAGNOSIS — Z471 Aftercare following joint replacement surgery: Secondary | ICD-10-CM | POA: Diagnosis not present

## 2024-02-22 DIAGNOSIS — Z96651 Presence of right artificial knee joint: Secondary | ICD-10-CM | POA: Diagnosis not present

## 2024-02-24 DIAGNOSIS — Z471 Aftercare following joint replacement surgery: Secondary | ICD-10-CM | POA: Diagnosis not present

## 2024-02-24 DIAGNOSIS — Z96651 Presence of right artificial knee joint: Secondary | ICD-10-CM | POA: Diagnosis not present

## 2024-02-24 DIAGNOSIS — R2689 Other abnormalities of gait and mobility: Secondary | ICD-10-CM | POA: Diagnosis not present

## 2024-02-24 DIAGNOSIS — M6281 Muscle weakness (generalized): Secondary | ICD-10-CM | POA: Diagnosis not present

## 2024-02-29 DIAGNOSIS — M6281 Muscle weakness (generalized): Secondary | ICD-10-CM | POA: Diagnosis not present

## 2024-02-29 DIAGNOSIS — R2689 Other abnormalities of gait and mobility: Secondary | ICD-10-CM | POA: Diagnosis not present

## 2024-02-29 DIAGNOSIS — Z471 Aftercare following joint replacement surgery: Secondary | ICD-10-CM | POA: Diagnosis not present

## 2024-02-29 DIAGNOSIS — Z96651 Presence of right artificial knee joint: Secondary | ICD-10-CM | POA: Diagnosis not present

## 2024-03-02 DIAGNOSIS — R2689 Other abnormalities of gait and mobility: Secondary | ICD-10-CM | POA: Diagnosis not present

## 2024-03-02 DIAGNOSIS — Z471 Aftercare following joint replacement surgery: Secondary | ICD-10-CM | POA: Diagnosis not present

## 2024-03-02 DIAGNOSIS — Z96651 Presence of right artificial knee joint: Secondary | ICD-10-CM | POA: Diagnosis not present

## 2024-03-02 DIAGNOSIS — M6281 Muscle weakness (generalized): Secondary | ICD-10-CM | POA: Diagnosis not present

## 2024-03-07 ENCOUNTER — Encounter: Payer: Medicare HMO | Admitting: Nurse Practitioner

## 2024-03-07 DIAGNOSIS — R2689 Other abnormalities of gait and mobility: Secondary | ICD-10-CM | POA: Diagnosis not present

## 2024-03-07 DIAGNOSIS — M6281 Muscle weakness (generalized): Secondary | ICD-10-CM | POA: Diagnosis not present

## 2024-03-07 DIAGNOSIS — Z96651 Presence of right artificial knee joint: Secondary | ICD-10-CM | POA: Diagnosis not present

## 2024-03-07 DIAGNOSIS — Z471 Aftercare following joint replacement surgery: Secondary | ICD-10-CM | POA: Diagnosis not present

## 2024-03-09 ENCOUNTER — Ambulatory Visit: Admitting: Nurse Practitioner

## 2024-03-09 ENCOUNTER — Encounter: Payer: Self-pay | Admitting: Nurse Practitioner

## 2024-03-09 ENCOUNTER — Other Ambulatory Visit: Payer: Self-pay | Admitting: Nurse Practitioner

## 2024-03-09 VITALS — BP 132/68 | HR 61 | Temp 97.9°F | Ht 72.0 in | Wt 193.6 lb

## 2024-03-09 DIAGNOSIS — J302 Other seasonal allergic rhinitis: Secondary | ICD-10-CM

## 2024-03-09 DIAGNOSIS — Z0000 Encounter for general adult medical examination without abnormal findings: Secondary | ICD-10-CM | POA: Diagnosis not present

## 2024-03-09 NOTE — Patient Instructions (Signed)
  Joshua Mercado , Thank you for taking time to come for your Medicare Wellness Visit. I appreciate your ongoing commitment to your health goals. Please review the following plan we discussed and let me know if I can assist you in the future.     This is a list of the screening recommended for you and due dates:  Health Maintenance  Topic Date Due   Flu Shot  02/11/2024   COVID-19 Vaccine (11 - Moderna risk 2024-25 season) 08/25/2024*   Medicare Annual Wellness Visit  03/09/2025   Colon Cancer Screening  05/06/2027   DTaP/Tdap/Td vaccine (3 - Td or Tdap) 12/20/2032   Pneumococcal Vaccine for age over 63  Completed   Zoster (Shingles) Vaccine  Completed   HPV Vaccine  Aged Out   Meningitis B Vaccine  Aged Out   Hepatitis C Screening  Discontinued  *Topic was postponed. The date shown is not the original due date.

## 2024-03-09 NOTE — Progress Notes (Signed)
 Subjective:   Joshua Mercado is a 78 y.o. male who presents for Medicare Annual/Subsequent preventive examination.  Visit Complete: In person TL clinic   Cardiac Risk Factors include: advanced age (>36men, >46 women);male gender;dyslipidemia;sedentary lifestyle     Objective:    Today's Vitals   03/09/24 0954  BP: 132/68  Pulse: 61  Temp: 97.9 F (36.6 C)  SpO2: 98%  Weight: 193 lb 9.6 oz (87.8 kg)  Height: 6' (1.829 m)   Body mass index is 26.26 kg/m.     03/09/2024    9:58 AM 01/04/2024   10:46 AM 12/17/2023    5:30 PM 12/13/2023   10:13 AM 07/27/2023   10:18 AM 07/01/2023    8:17 AM 03/12/2023   11:47 AM  Advanced Directives  Does Patient Have a Medical Advance Directive? Yes Yes Yes Yes Yes Yes Yes  Type of Advance Directive Out of facility DNR (pink MOST or yellow form) Healthcare Power of [employer] Power of [employer] Power of Attorney Out of facility DNR (pink MOST or yellow form) Out of facility DNR (pink MOST or yellow form) Out of facility DNR (pink MOST or yellow form)  Does patient want to make changes to medical advance directive? No - Patient declined No - Patient declined No - Patient declined  No - Patient declined No - Patient declined No - Patient declined  Copy of Healthcare Power of Attorney in Chart?  No - copy requested  No - copy requested     Pre-existing out of facility DNR order (yellow form or pink MOST form)       Pink MOST form placed in chart (order not valid for inpatient use)    Current Medications (verified) Outpatient Encounter Medications as of 03/09/2024  Medication Sig   acetaminophen  (TYLENOL ) 650 MG CR tablet Take 650 mg by mouth 3 (three) times daily.   albuterol  (PROVENTIL ) (2.5 MG/3ML) 0.083% nebulizer solution Take 3 mLs (2.5 mg total) by nebulization every 6 (six) hours as needed for wheezing or shortness of breath.   calcium carbonate (TUMS - DOSED IN MG ELEMENTAL CALCIUM) 500 MG chewable tablet Chew 2 tablets by mouth  every 4 (four) hours as needed for indigestion or heartburn.   Cholecalciferol (VITAMIN D3) 125 MCG (5000 UT) CAPS Take 1 capsule by mouth daily.   clotrimazole -betamethasone  (LOTRISONE ) cream Apply 1 Application topically 2 (two) times daily as needed.   cyanocobalamin  (VITAMIN B12) 500 MCG tablet Take 500 mcg by mouth daily.   cyclobenzaprine  (FLEXERIL ) 10 MG tablet TAKE 1 TABLET BY MOUTH TWICE  DAILY AS NEEDED FOR MUSCLE  SPASM(S)   escitalopram  (LEXAPRO ) 20 MG tablet Take 0.5 tablets (10 mg total) by mouth daily.   finasteride  (PROSCAR ) 5 MG tablet TAKE 1 TABLET EVERY DAY   fluticasone  (FLONASE ) 50 MCG/ACT nasal spray USE 1 SPRAY IN BOTH NOSTRILS DAILY   losartan  (COZAAR ) 100 MG tablet Take 1 tablet (100 mg total) by mouth daily.   melatonin 5 MG TABS Take 5 mg by mouth at bedtime.    Methylsulfonylmethane (MSM) 1000 MG CAPS Take 1 capsule by mouth 2 (two) times daily.   metoprolol  succinate (TOPROL -XL) 25 MG 24 hr tablet Take 25 mg by mouth daily.   Multiple Vitamins-Minerals (MULTIVITAMIN MEN 50+) TABS Take 1 tablet by mouth daily.   pantoprazole  (PROTONIX ) 40 MG tablet TAKE 1 TABLET EVERY DAY   polyethylene glycol (MIRALAX  / GLYCOLAX ) 17 g packet Take 17 g by mouth daily.   simvastatin  (ZOCOR ) 40  MG tablet Take 1 tablet (40 mg total) by mouth daily.   tamsulosin  (FLOMAX ) 0.4 MG CAPS capsule TAKE 1 CAPSULE EVERY DAY   Tiotropium Bromide  Monohydrate (SPIRIVA  RESPIMAT) 2.5 MCG/ACT AERS Inhale 2 puffs into the lungs daily.   triamcinolone  cream (KENALOG ) 0.1 % APPLY  CREAM EXTERNALLY TO AFFECTED AREA TWICE DAILY   No facility-administered encounter medications on file as of 03/09/2024.    Allergies (verified) Bee pollen and Bee venom   History: Past Medical History:  Diagnosis Date   Arthritis    Knees   COPD (chronic obstructive pulmonary disease) (HCC)    Does use hearing aid    Bilateral   Dysrhythmia    Enlarged prostate    GERD (gastroesophageal reflux disease)     Hypercholesterolemia    Hypertension    Sleep apnea    Past Surgical History:  Procedure Laterality Date   CHEEK AUGMENTATION Left 1970   REstructure of cheek bone    COLONOSCOPY  2017   Dr. Nellie   COLONOSCOPY WITH PROPOFOL  N/A 05/05/2022   Procedure: COLONOSCOPY WITH PROPOFOL ;  Surgeon: Jinny Carmine, MD;  Location: [facility];  Service: Endoscopy;  Laterality: N/A;   ESOPHAGOGASTRODUODENOSCOPY (EGD) WITH PROPOFOL  N/A 12/05/2020   Procedure: ESOPHAGOGASTRODUODENOSCOPY (EGD) WITH PROPOFOL ;  Surgeon: Jinny Carmine, MD;  Location: [facility] ENDOSCOPY;  Service: Endoscopy;  Laterality: N/A;   EYE SURGERY Bilateral 2005   NASAL SEPTOPLASTY W/ TURBINOPLASTY Bilateral 12/24/2020   Procedure: NASAL SEPTOPLASTY WITH INFERIOR TURBINATE REDUCTION;  Surgeon: Milissa Hamming, MD;  Location: [facility] ORS;  Service: ENT;  Laterality: Bilateral;   TOTAL KNEE ARTHROPLASTY Right 12/17/2023   Procedure: ARTHROPLASTY, KNEE, TOTAL;  Surgeon: Kay Kemps, MD;  Location: [facility];  Service: Orthopedics;  Laterality: Right;   UPPER GI ENDOSCOPY     Family History  Problem Relation Age of Onset   Alzheimer's disease Mother    Diabetes Mother    Heart failure Father    Heart attack Father    Diabetes Father    Dementia Father    Social History   Socioeconomic History   Marital status: Married    Spouse name: Not on file   Number of children: Not on file   Years of education: Not on file   Highest education level: Associate degree: academic program  Occupational History   Not on file  Tobacco Use   Smoking status: Former    Current packs/day: 0.00    Average packs/day: 1 pack/day for 35.0 years (35.0 ttl pk-yrs)    Types: Cigarettes    Start date: 42    Quit date: 19    Years since quitting: 40.6   Smokeless tobacco: Never  Vaping Use   Vaping status: Never Used  Substance and Sexual Activity   Alcohol use: Not Currently   Drug use: Never   Sexual activity: Not on file  Other Topics Concern    Not on file  Social History Narrative   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  Wife is a retired Development worker, community.    Social Drivers of Corporate investment banker Strain: Low Risk  (03/08/2024)   Overall Financial Resource Strain (CARDIA)    Difficulty of Paying Living Expenses: Not very hard  Food Insecurity: No Food Insecurity (03/08/2024)   Hunger Vital Sign    Worried About Running Out of Food in the Last Year: Never true    Ran Out of Food in the Last Year: Never true  Transportation Needs: No Transportation Needs (  03/08/2024)   PRAPARE - Administrator, Civil Service (Medical): No    Lack of Transportation (Non-Medical): No  Physical Activity: Insufficiently Active (03/08/2024)   Exercise Vital Sign    Days of Exercise per Week: 3 days    Minutes of Exercise per Session: 30 min  Stress: No Stress Concern Present (03/08/2024)   [employer] of Occupational Health - Occupational Stress Questionnaire    Feeling of Stress: Not at all  Social Connections: Unknown (03/08/2024)   Social Connection and Isolation Panel    Frequency of Communication with Friends and Family: Once a week    Frequency of Social Gatherings with Friends and Family: Patient declined    Attends Religious Services: Patient declined    Database administrator or Organizations: Yes    Attends Engineer, structural: More than 4 times per year    Marital Status: Widowed    Tobacco Counseling Counseling given: Not Answered   Clinical Intake:  Pre-visit preparation completed: Yes  Pain : No/denies pain     BMI - recorded: 26 Nutritional Status: BMI 25 -29 Overweight Nutritional Risks: None  How often do you need to have someone help you when you read instructions, pamphlets, or other written materials from your doctor or pharmacy?: 1 - Never         Activities of Daily Living    03/09/2024   10:04 AM 03/08/2024   11:39 AM  In your present state of health, do you have  any difficulty performing the following activities:  Hearing? 0 0  Vision? 0 0  Difficulty concentrating or making decisions? 0   Walking or climbing stairs? 1 1  Dressing or bathing? 0 1  Doing errands, shopping? 0 0  Preparing Food and eating ? N Y  Using the Toilet? N N  In the past six months, have you accidently leaked urine? N Y  Do you have problems with loss of bowel control? N N  Managing your Medications? N Y  Managing your Finances? N N  Housekeeping or managing your Housekeeping? Joshua Mercado Joshua Mercado    Patient Care Team: Caro Harlene POUR, NP as PCP - General (Geriatric Medicine) Darliss Rogue, MD as PCP - Cardiology (Cardiology)  Indicate any recent Medical Services you may have received from other than [facility] providers in the past year (date may be approximate).     Assessment:   This is a routine wellness examination for Joshua Mercado.  Hearing/Vision screen Vision Screening - Comments:: [facility] Last Exam: 11/2023   Goals Addressed   None    Depression Screen    03/09/2024    9:56 AM 01/04/2024   10:46 AM 10/14/2023    8:28 AM 08/26/2023   10:42 AM 07/27/2023   10:18 AM 03/02/2023   10:25 AM 12/22/2022   10:25 AM  PHQ 2/9 Scores  PHQ - 2 Score 0 0 0 0 0 0 2  PHQ- 9 Score       3    Fall Risk    03/09/2024    9:56 AM 03/08/2024   11:39 AM 01/04/2024   10:46 AM 10/14/2023    8:27 AM 08/26/2023   10:42 AM  Fall Risk   Falls in the past year? 1 0 0 0 0  Number falls in past yr: 1 0 0 0 0  Injury with Fall? 0 0 0 0 0  Risk for fall due to : No Fall Risks  No Fall Risks No Fall  Risks   Follow up Falls evaluation completed  Falls evaluation completed Falls evaluation completed Falls evaluation completed    MEDICARE RISK AT HOME: Medicare Risk at Home Any stairs in or around the home?: Yes If so, are there any without handrails?: No Home free of loose throw rugs in walkways, pet beds, electrical cords, etc?: Yes Adequate lighting in your home to reduce risk of  falls?: Yes Life alert?: Yes Use of a cane, walker or w/c?: Yes Grab bars in the bathroom?: Yes Shower chair or bench in shower?: Yes Elevated toilet seat or a handicapped toilet?: Yes  TIMED UP AND GO:  Was the test performed?  No    Cognitive Function:        03/09/2024    9:58 AM 03/02/2023   10:27 AM 01/27/2022    8:45 AM 01/22/2021   11:05 AM 01/16/2020    9:38 AM  6CIT Screen  What Year? 0 points 0 points 0 points 0 points 0 points  What month? 0 points 0 points 0 points 0 points 0 points  What time? 3 points 0 points 0 points 0 points 0 points  Count back from 20 0 points 0 points 0 points 0 points 0 points  Months in reverse 0 points 0 points 0 points 0 points 0 points  Repeat phrase 4 points 2 points 2 points 2 points 2 points  Total Score 7 points 2 points 2 points 2 points 2 points    Immunizations Immunization History  Administered Date(s) Administered   INFLUENZA, HIGH DOSE SEASONAL PF 04/11/2015, 04/15/2016   Influenza-Unspecified 05/14/2019, 05/02/2020, 04/24/2021, 04/10/2022, 04/13/2023   Moderna Covid-19 Fall Seasonal Vaccine 53yrs & older 10/20/2022   Moderna Covid-19 Vaccine Bivalent Booster 41yrs & up 04/22/2021, 12/09/2021, 10/20/2022   Moderna Sars-Covid-2 Vaccination 07/25/2019, 08/16/2019, 09/13/2019, 05/28/2020, 11/28/2020   PNEUMOCOCCAL CONJUGATE-20 01/14/2023   Pneumococcal Conjugate-13 04/13/2015, 05/13/2015   Pneumococcal Polysaccharide-23 11/24/2012   RSV,unspecified 06/11/2022   Tdap 12/11/2016, 12/21/2022   Unspecified SARS-COV-2 Vaccination 06/11/2022, 04/20/2023   Zoster Recombinant(Shingrix) 03/18/2022, 12/15/2022   Zoster, Live 06/13/2007    TDAP status: Up to date  Flu Vaccine status: Due, Education has been provided regarding the importance of this vaccine. Advised may receive this vaccine at local pharmacy or Health Dept. Aware to provide a copy of the vaccination record if obtained from local pharmacy or Health Dept. Verbalized  acceptance and understanding.  Pneumococcal vaccine status: Up to date  Covid-19 vaccine status: Information provided on how to obtain vaccines.   Qualifies for Shingles Vaccine? Yes   Zostavax completed No   Shingrix Completed?: Yes  Screening Tests Health Maintenance  Topic Date Due   INFLUENZA VACCINE  02/11/2024   COVID-19 Vaccine (11 - Moderna risk 2024-25 season) 08/25/2024 (Originally 10/19/2023)   Medicare Annual Wellness (AWV)  03/09/2025   Colonoscopy  05/06/2027   DTaP/Tdap/Td (3 - Td or Tdap) 12/20/2032   Pneumococcal Vaccine: 50+ Years  Completed   Zoster Vaccines- Shingrix  Completed   HPV VACCINES  Aged Out   Meningococcal B Vaccine  Aged Out   Hepatitis C Screening  Discontinued    Health Maintenance  Health Maintenance Due  Topic Date Due   INFLUENZA VACCINE  02/11/2024    Colorectal cancer screening: No longer required.   Lung Cancer Screening: (Low Dose CT Chest recommended if Age 43-80 years, 20 pack-year currently smoking OR have quit w/in 15years.) does not qualify.   Lung Cancer Screening Referral: na  Additional Screening:  Hepatitis  C Screening: does not qualify; Completed  Vision Screening: Recommended annual ophthalmology exams for early detection of glaucoma and other disorders of the eye. Is the patient up to date with their annual eye exam?  Yes  Who is the provider or what is the name of the office in which the patient attends annual eye exams? [facility] If pt is not established with a provider, would they like to be referred to a provider to establish care? No .   Dental Screening: Recommended annual dental exams for proper oral hygiene  Community Resource Referral / Chronic Care Management: CRR required this visit?  No   CCM required this visit?  No     Plan:     I have personally reviewed and noted the following in the patient's chart:   Medical and social history Use of alcohol, tobacco or illicit drugs  Current  medications and supplements including opioid prescriptions. Patient is not currently taking opioid prescriptions. Functional ability and status Nutritional status Physical activity Advanced directives List of other physicians Hospitalizations, surgeries, and ER visits in previous 12 months Vitals Screenings to include cognitive, depression, and falls Referrals and appointments  In addition, I have reviewed and discussed with patient certain preventive protocols, quality metrics, and best practice recommendations. A written personalized care plan for preventive services as well as general preventive health recommendations were provided to patient.     Harlene MARLA An, NP   03/09/2024

## 2024-03-10 ENCOUNTER — Ambulatory Visit: Attending: Cardiology | Admitting: Cardiology

## 2024-03-10 ENCOUNTER — Encounter: Payer: Self-pay | Admitting: Cardiology

## 2024-03-10 VITALS — BP 134/62 | HR 62 | Ht 73.0 in | Wt 196.2 lb

## 2024-03-10 DIAGNOSIS — H903 Sensorineural hearing loss, bilateral: Secondary | ICD-10-CM | POA: Diagnosis not present

## 2024-03-10 DIAGNOSIS — E782 Mixed hyperlipidemia: Secondary | ICD-10-CM | POA: Diagnosis not present

## 2024-03-10 DIAGNOSIS — I10 Essential (primary) hypertension: Secondary | ICD-10-CM | POA: Diagnosis not present

## 2024-03-10 DIAGNOSIS — I351 Nonrheumatic aortic (valve) insufficiency: Secondary | ICD-10-CM | POA: Diagnosis not present

## 2024-03-10 DIAGNOSIS — E785 Hyperlipidemia, unspecified: Secondary | ICD-10-CM | POA: Diagnosis not present

## 2024-03-10 DIAGNOSIS — Z01810 Encounter for preprocedural cardiovascular examination: Secondary | ICD-10-CM

## 2024-03-10 NOTE — Progress Notes (Signed)
 Cardiology Office Note   Date:  03/10/2024  ID:  Joshua Mercado Oct 03, 1945, MRN [id number] PCP: Joshua Harlene POUR, NP  [facility] Providers Cardiologist:  Joshua Cave, MD     History of Present Illness Joshua Mercado is a 78 y.o. male with a past medical history of hyperlipidemia, BPH, COPD, hypertension, obstructive sleep apnea, COPD, arthritis, presyncope, who is here today for follow-up and preoperative cardiovascular examination pending left total knee arthroplasty.   He had previously been seen in clinic in 2021 for irregular heartbeat. He wore an event monitor at that time that showed occasional PVC. Echocardiogram completed showed an EF of 60% with mild aortic insufficiency. After clinic visit 03/20/2023 where he continued to have lightheadedness and orthostatic vital signs revealed some orthostasis with systolic drop from 170s to 150s with changing positions from sitting to standing he was placed back on repeat heart monitor and had a repeat echocardiogram. Monitor showed occasional paroxysmal SVT, 1 episode of nonsustained V. tach lasting 5 beats, frequent PACs with an 18% burden and occasional PVCs. Overall no significant sustained arrhythmias to suggest etiology of dizziness. It was recommended that he consider starting low-dose beta-blocker for ectopy suppression. Repeat echocardiogram revealed an LVEF of 50-55%, no RWMA, mild LVH, mild aortic regurgitation and mild dilatation of aortic root measuring 44 mm.   Previously been seen in clinic 06/18/2023 at that time with several questions related to blood pressure.  He had recently followed up with his PCP and advised to increase his metoprolol .  Blood pressure had been elevated he continues to complain of dizziness, lightheadedness, etc. is unusual for him.  He was changed from metoprolol  to bisoprolol due to side effects and was encouraged to continue to monitor his pressures.   He was last seen in clinic 09/02/23.  At that time he  was requesting preoperative cardiovascular examination requiring knee replacement surgery.  He continues to endorse joint pain and fatigue from his knees.  He had an increase in blood pressure and increased losartan  approximately 1 week prior and continue to keep a blood pressure log at home.  He returns to clinic today stating that he has been doing well and recently had a right knee replacement and is requesting preoperative cardiovascular examination today for upcoming left knee replacement.  He continues to complain a little bit of discomfort but has done well with therapy.  He denies any symptoms of cardiac decompensation.  States he has been compliant with his current medication regimen without any adverse side effects.  Denies any emergency department visits  ROS: 10 point review of systems has been reviewed and considered negative the exception was been listed in the HPI  Studies Reviewed EKG Interpretation Date/Time:  Friday March 10 2024 09:59:06 EDT Ventricular Rate:  62 PR Interval:  160 QRS Duration:  94 QT Interval:  416 QTC Calculation: 422 R Axis:   17  Text Interpretation: Sinus rhythm with Premature supraventricular complexes When compared with ECG of 02-Sep-2023 14:13, Premature supraventricular complexes are now Present Confirmed by Joshua Mercado ([id number]) on 03/10/2024 10:11:12 AM    TTE 05/28/23 1. Left ventricular ejection fraction, by estimation, is 50 to 55%. Left  ventricular ejection fraction by 2D MOD biplane is 50.9 %. The left  ventricle has low normal function. The left ventricle has no regional wall  motion abnormalities. There is mild  left ventricular hypertrophy. Left ventricular diastolic parameters are  consistent with Grade I diastolic dysfunction (impaired relaxation).   2. Right ventricular systolic  function is normal. The right ventricular  size is normal.   3. The mitral valve is normal in structure. No evidence of mitral valve  regurgitation.    4. The aortic valve is tricuspid. Aortic valve regurgitation is mild.   5. Aortic dilatation noted. There is mild dilatation of the aortic root,  measuring 40 mm.   6. The inferior vena cava is normal in size with <50% respiratory  variability, suggesting right atrial pressure of 8 mmHg.    Event monitor 05/10/23 Conclusion Occasional paroxysmal SVT. 1 episode of nonsustained VT lasting 5 beats. Frequent PACs, 18% burden, occasional PVCs. Overall no significant sustained arrhythmias to suggest etiology of dizziness. Will Consider starting low-dose beta-blocker for ectopy suppression. Risk Assessment/Calculations           Physical Exam VS:  BP 134/62   Pulse 62   Ht 6' 1 (1.854 m)   Wt 196 lb 3.2 oz (89 kg)   SpO2 94%   BMI 25.89 kg/m        Wt Readings from Last 3 Encounters:  03/10/24 196 lb 3.2 oz (89 kg)  03/09/24 193 lb 9.6 oz (87.8 kg)  01/04/24 192 lb 12.8 oz (87.5 kg)    GEN: Well nourished, well developed in no acute distress NECK: No JVD; No carotid bruits CARDIAC: RRR, no murmurs, rubs, gallops RESPIRATORY:  Clear to auscultation without rales, wheezing or rhonchi  ABDOMEN: Soft, non-tender, non-distended EXTREMITIES:  No edema; No deformity   ASSESSMENT AND PLAN Primary hypertension with blood pressure today 134/64 which has been within range.  He has been continued on losartan  100 mg daily and Toprol -XL 25 mg daily.  He has been encouraged to continue to monitor his pressures 1 to 2 hours postmedication administration as well.  Hyperlipidemia with an LDL of 124 where he is continued on simvastatin  40 mg daily goal of less than 100.  Ongoing management per PCP.  History of mild AR with most recent echocardiogram completed 05/2023 with an LVEF 50-55%, no RWMA, G1 DD, mild aortic regurgitation.  No significant change from prior studies will continue to monitor with surveillance studies.  Preoperative cardiovascular examination the EKG revealed sinus rhythm  with occasional PACs with a rate of 62 with no acute ischemic changes noted at this time.    [name] perioperative risk of a major cardiac event is 0.4% according to the Revised Cardiac Risk Index (RCRI).  Therefore, he is at low risk for perioperative complications.   His functional capacity is fair at 5.07 METs according to the Duke Activity Status Index (DASI). Recommendations: According to ACC/AHA guidelines, no further cardiovascular testing needed.  The patient may proceed to surgery at acceptable risk.            Dispo: Patient to return to clinic to see MD/APP in 6 months or sooner if needed for further evaluation  Signed, Trini Christiansen, NP

## 2024-03-10 NOTE — Patient Instructions (Signed)
 Medication Instructions:  No changes at this time.   *If you need a refill on your cardiac medications before your next appointment, please call your pharmacy*  Lab Work: None  If you have labs (blood work) drawn today and your tests are completely normal, you will receive your results only by: MyChart Message (if you have MyChart) OR A paper copy in the mail If you have any lab test that is abnormal or we need to change your treatment, we will call you to review the results.  Testing/Procedures: None  Follow-Up: At [facility], you and your health needs are our priority.  As part of our continuing mission to provide you with exceptional heart care, our providers are all part of one team.  This team includes your primary Cardiologist (physician) and Advanced Practice Providers or APPs (Physician Assistants and Nurse Practitioners) who all work together to provide you with the care you need, when you need it.  Your next appointment:   6 month(s)  Provider:   Redell Cave, MD or Tylene Lunch, NP

## 2024-03-14 ENCOUNTER — Encounter: Payer: Self-pay | Admitting: Nurse Practitioner

## 2024-03-14 ENCOUNTER — Ambulatory Visit: Admitting: Nurse Practitioner

## 2024-03-14 VITALS — BP 132/76 | HR 82 | Temp 98.2°F | Ht 73.0 in | Wt 191.6 lb

## 2024-03-14 DIAGNOSIS — B001 Herpesviral vesicular dermatitis: Secondary | ICD-10-CM

## 2024-03-14 MED ORDER — VALACYCLOVIR HCL 1 G PO TABS
2000.0000 mg | ORAL_TABLET | Freq: Two times a day (BID) | ORAL | 2 refills | Status: AC
Start: 2024-03-14 — End: ?

## 2024-03-14 NOTE — Progress Notes (Signed)
 Careteam: Patient Care Team: Joshua Harlene POUR, NP as PCP - General (Geriatric Medicine) Darliss Rogue, MD as PCP - Cardiology (Cardiology)  PLACE OF SERVICE:  [facility]  Advanced Directive information    Allergies  Allergen Reactions   Bee Pollen Anaphylaxis    Bee Venom   Bee Venom     Throat/mouth swelling    Chief Complaint  Patient presents with   Mouth Lesions    Cold Sore on Lip x 2 days.      HPI: The patient is an 78 year old male presenting for an acute visit regarding a cold sore on his bottom lip, which developed approximately three days ago, coinciding with his seasonal flu vaccination at [facility] pharmacy.   He reports a history of cold sores, but it has been many years since his last outbreak. He mentions using the antiviral Valtrex  cream in the past, but recalls it being very expensive. The patient denies experiencing fevers, chills, shortness of breath, cough, sick contacts, nausea, vomiting, or diarrhea.  Review of Systems:  Review of Systems  HENT:         Cold sore to mid bottom lip  Respiratory: Negative.    Cardiovascular: Negative.   Gastrointestinal: Negative.   Genitourinary: Negative.   Musculoskeletal: Negative.   Skin: Negative.   Neurological: Negative.   All other systems reviewed and are negative.   Past Medical History:  Diagnosis Date   Arthritis    Knees   COPD (chronic obstructive pulmonary disease) (HCC)    Does use hearing aid    Bilateral   Dysrhythmia    Enlarged prostate    GERD (gastroesophageal reflux disease)    Hypercholesterolemia    Hypertension    Sleep apnea    Past Surgical History:  Procedure Laterality Date   CHEEK AUGMENTATION Left 1970   REstructure of cheek bone    COLONOSCOPY  2017   Dr. Nellie   COLONOSCOPY WITH PROPOFOL  N/A 05/05/2022   Procedure: COLONOSCOPY WITH PROPOFOL ;  Surgeon: Jinny Carmine, MD;  Location: [facility] ENDOSCOPY;  Service: Endoscopy;  Laterality: N/A;    ESOPHAGOGASTRODUODENOSCOPY (EGD) WITH PROPOFOL  N/A 12/05/2020   Procedure: ESOPHAGOGASTRODUODENOSCOPY (EGD) WITH PROPOFOL ;  Surgeon: Jinny Carmine, MD;  Location: [facility] ENDOSCOPY;  Service: Endoscopy;  Laterality: N/A;   EYE SURGERY Bilateral 2005   NASAL SEPTOPLASTY W/ TURBINOPLASTY Bilateral 12/24/2020   Procedure: NASAL SEPTOPLASTY WITH INFERIOR TURBINATE REDUCTION;  Surgeon: Milissa Hamming, MD;  Location: [facility] ORS;  Service: ENT;  Laterality: Bilateral;   TOTAL KNEE ARTHROPLASTY Right 12/17/2023   Procedure: ARTHROPLASTY, KNEE, TOTAL;  Surgeon: Kay Kemps, MD;  Location: [facility] ORS;  Service: Orthopedics;  Laterality: Right;   UPPER GI ENDOSCOPY     Social History:   reports that he quit smoking about 40 years ago. His smoking use included cigarettes. He started smoking about 75 years ago. He has a 35 pack-year smoking history. He has never used smokeless tobacco. He reports that he does not currently use alcohol. He reports that he does not use drugs.  Family History  Problem Relation Age of Onset   Alzheimer's disease Mother    Diabetes Mother    Heart failure Father    Heart attack Father    Diabetes Father    Dementia Father     Medications: Patient's Medications  New Prescriptions   No medications on file  Previous Medications   ACETAMINOPHEN  (TYLENOL ) 650 MG CR TABLET    Take 650 mg by mouth 3 (three) times daily.  ALBUTEROL  (PROVENTIL ) (2.5 MG/3ML) 0.083% NEBULIZER SOLUTION    Take 3 mLs (2.5 mg total) by nebulization every 6 (six) hours as needed for wheezing or shortness of breath.   CALCIUM CARBONATE (TUMS - DOSED IN MG ELEMENTAL CALCIUM) 500 MG CHEWABLE TABLET    Chew 2 tablets by mouth every 4 (four) hours as needed for indigestion or heartburn.   CHOLECALCIFEROL (VITAMIN D3) 125 MCG (5000 UT) CAPS    Take 1 capsule by mouth daily.   CLOTRIMAZOLE -BETAMETHASONE  (LOTRISONE ) CREAM    Apply 1 Application topically 2 (two) times daily as needed.   CYANOCOBALAMIN  (VITAMIN B12)  500 MCG TABLET    Take 500 mcg by mouth daily.   CYCLOBENZAPRINE  (FLEXERIL ) 10 MG TABLET    TAKE 1 TABLET BY MOUTH TWICE  DAILY AS NEEDED FOR MUSCLE  SPASM(S)   ESCITALOPRAM  (LEXAPRO ) 20 MG TABLET    Take 0.5 tablets (10 mg total) by mouth daily.   FINASTERIDE  (PROSCAR ) 5 MG TABLET    TAKE 1 TABLET EVERY DAY   FLUTICASONE  (FLONASE ) 50 MCG/ACT NASAL SPRAY    USE 1 SPRAY IN BOTH NOSTRILS DAILY   LOSARTAN  (COZAAR ) 100 MG TABLET    Take 1 tablet (100 mg total) by mouth daily.   MELATONIN 5 MG TABS    Take 5 mg by mouth at bedtime.    METHYLSULFONYLMETHANE (MSM) 1000 MG CAPS    Take 1 capsule by mouth 2 (two) times daily.   METOPROLOL  SUCCINATE (TOPROL -XL) 25 MG 24 HR TABLET    Take 25 mg by mouth daily.   MULTIPLE VITAMINS-MINERALS (MULTIVITAMIN MEN 50+) TABS    Take 1 tablet by mouth daily.   PANTOPRAZOLE  (PROTONIX ) 40 MG TABLET    TAKE 1 TABLET EVERY DAY   POLYETHYLENE GLYCOL (MIRALAX  / GLYCOLAX ) 17 G PACKET    Take 17 g by mouth daily.   SIMVASTATIN  (ZOCOR ) 40 MG TABLET    Take 1 tablet (40 mg total) by mouth daily.   TAMSULOSIN  (FLOMAX ) 0.4 MG CAPS CAPSULE    TAKE 1 CAPSULE EVERY DAY   TIOTROPIUM BROMIDE  MONOHYDRATE (SPIRIVA  RESPIMAT) 2.5 MCG/ACT AERS    Inhale 2 puffs into the lungs daily.   TRIAMCINOLONE  CREAM (KENALOG ) 0.1 %    APPLY  CREAM EXTERNALLY TO AFFECTED AREA TWICE DAILY  Modified Medications   No medications on file  Discontinued Medications   No medications on file    Physical Exam:  Vitals:   03/14/24 1057  BP: 132/76  Pulse: 82  Temp: 98.2 F (36.8 C)  SpO2: 97%  Weight: 191 lb 9.6 oz (86.9 kg)  Height: 6' 1 (1.854 m)   Body mass index is 25.28 kg/m. Wt Readings from Last 3 Encounters:  03/14/24 191 lb 9.6 oz (86.9 kg)  03/10/24 196 lb 3.2 oz (89 kg)  03/09/24 193 lb 9.6 oz (87.8 kg)    Physical Exam Vitals reviewed.  Constitutional:      Appearance: Normal appearance.  HENT:     Head: Normocephalic and atraumatic.     Nose: Nose normal.      Mouth/Throat:     Lips: Lesions present.   Eyes:     Conjunctiva/sclera: Conjunctivae normal.  Cardiovascular:     Rate and Rhythm: Normal rate and regular rhythm.     Pulses: Normal pulses.     Heart sounds: Normal heart sounds.  Pulmonary:     Effort: Pulmonary effort is normal.     Breath sounds: Normal breath sounds.  Abdominal:  Palpations: Abdomen is soft.  Skin:    General: Skin is warm and dry.  Neurological:     General: No focal deficit present.     Mental Status: He is alert and oriented to person, place, and time. Mental status is at baseline.  Psychiatric:        Mood and Affect: Mood normal.        Behavior: Behavior normal.     Labs reviewed: Basic Metabolic Panel: Recent Labs    10/11/23 0745 12/13/23 1039  NA 140 137  K 3.9 4.2  CL 108 108  CO2 24 22  GLUCOSE 92 85  BUN 22 15  CREATININE 0.77 0.85  CALCIUM 9.4 9.3   Liver Function Tests: Recent Labs    10/11/23 0745  AST 17  ALT 16  BILITOT 1.1  PROT 6.8   No results for input(s): LIPASE, AMYLASE in the last 8760 hours. No results for input(s): AMMONIA in the last 8760 hours. CBC: Recent Labs    10/11/23 0745 12/13/23 1039  WBC 6.2 5.9  NEUTROABS 3,174  --   HGB 15.6 15.0  HCT 45.7 44.6  MCV 91.8 95.5  PLT 133* 159   Lipid Panel: Recent Labs    10/11/23 0745  CHOL 195  HDL 48  LDLCALC 124*  TRIG 115  CHOLHDL 4.1   TSH: No results for input(s): TSH in the last 8760 hours. A1C: No results found for: HGBA1C   Assessment/Plan  1. Herpes Labialis without Complication (Primary)  valACYclovir  (VALTREX ) 1000 mg tablet: Take 2 tablets (total of 2000 mg) by mouth twice daily. For 1 day. Dispense: 4 tablets; Refill: 2.  Over-the-counter cold sore treatments, such as Abreva, may be used for additional relief.  Follow-up is recommended if there is no relief or worsening symptoms; otherwise, continue with routine scheduled visits.  Waylan Rabon, RN DNP-AGPCNP  Student I personally was present during the history, physical exam and medical decision-making activities of this service and have verified that the service and findings are accurately documented in the student's note Kaevon Cotta K. Joshua BODILY [facility] [phone]

## 2024-03-16 ENCOUNTER — Telehealth: Payer: Self-pay | Admitting: *Deleted

## 2024-03-16 ENCOUNTER — Other Ambulatory Visit: Payer: Self-pay | Admitting: Nurse Practitioner

## 2024-03-16 NOTE — Telephone Encounter (Signed)
 Patient called the [facility] requesting an ointment for his Cold sore to be called into pharmacy. Stated that he received the pills but he would also like the ointment.   Please Advise.

## 2024-03-16 NOTE — Telephone Encounter (Signed)
 Would recommend abreva which is over the counter to take with oral medication

## 2024-03-16 NOTE — Telephone Encounter (Signed)
 Patient notified and agreed.

## 2024-04-04 ENCOUNTER — Encounter: Payer: Self-pay | Admitting: Nurse Practitioner

## 2024-04-04 ENCOUNTER — Ambulatory Visit
Admission: RE | Admit: 2024-04-04 | Discharge: 2024-04-04 | Disposition: A | Discharge status: Home / Self Care | Attending: Nurse Practitioner | Admitting: Nurse Practitioner

## 2024-04-04 ENCOUNTER — Non-Acute Institutional Stay: Admitting: Nurse Practitioner

## 2024-04-04 ENCOUNTER — Ambulatory Visit
Admission: RE | Admit: 2024-04-04 | Discharge: 2024-04-04 | Disposition: A | Discharge status: Home / Self Care | Source: Ambulatory Visit | Attending: Nurse Practitioner | Admitting: Nurse Practitioner

## 2024-04-04 VITALS — BP 134/82 | HR 67 | Temp 97.6°F | Ht 72.0 in | Wt 189.8 lb

## 2024-04-04 DIAGNOSIS — K219 Gastro-esophageal reflux disease without esophagitis: Secondary | ICD-10-CM

## 2024-04-04 DIAGNOSIS — M170 Bilateral primary osteoarthritis of knee: Secondary | ICD-10-CM

## 2024-04-04 DIAGNOSIS — R350 Frequency of micturition: Secondary | ICD-10-CM

## 2024-04-04 DIAGNOSIS — E782 Mixed hyperlipidemia: Secondary | ICD-10-CM

## 2024-04-04 DIAGNOSIS — G4733 Obstructive sleep apnea (adult) (pediatric): Secondary | ICD-10-CM | POA: Diagnosis not present

## 2024-04-04 DIAGNOSIS — N401 Enlarged prostate with lower urinary tract symptoms: Secondary | ICD-10-CM

## 2024-04-04 DIAGNOSIS — R0781 Pleurodynia: Secondary | ICD-10-CM | POA: Diagnosis not present

## 2024-04-04 DIAGNOSIS — F419 Anxiety disorder, unspecified: Secondary | ICD-10-CM | POA: Diagnosis not present

## 2024-04-04 DIAGNOSIS — R0789 Other chest pain: Secondary | ICD-10-CM | POA: Diagnosis not present

## 2024-04-04 DIAGNOSIS — I10 Essential (primary) hypertension: Secondary | ICD-10-CM | POA: Diagnosis not present

## 2024-04-04 DIAGNOSIS — J449 Chronic obstructive pulmonary disease, unspecified: Secondary | ICD-10-CM

## 2024-04-04 NOTE — Progress Notes (Signed)
 Careteam: Patient Care Team: Joshua Harlene POUR, NP as PCP - General (Geriatric Medicine) Joshua Rogue, MD as PCP - Cardiology (Cardiology)  PLACE OF SERVICE:  [facility]  Advanced Directive information    Allergies  Allergen Reactions   Bee Pollen Anaphylaxis    Bee Venom   Bee Venom     Throat/mouth swelling    Chief Complaint  Patient presents with   Medical Management of Chronic Issues    Medical Management of Chronic Issues. 6 Month follow up. Complains of Left Rib Cage Pain. Joshua Mercado Last Monday   HPI: The patient is an 78 year old male presenting for routine chronic illness follow-up. He recently fell while visiting friends in [place], [place] , landing on his right arm and injuring his left rib cage. He denied hitting his head, had no dizziness, and attributes the fall to a misstep. At higher elevations, he had difficulty taking deep breaths, with lung expansion causing pain.  He reports ongoing tenderness in his left chest with deep breaths and a fading bruise in the same area. A few days ago, he experienced some chest pain, which he believes may have been indigestion. He requests a chest X-ray for further evaluation.  Lab work is up to date. He is not taking metoprolol  or protonix ; he seeks an alternative to protonix  based on medical advice and uses Tums, which provides relief.   Bowel movements and urination are normal. He states he takes Vitamin D 5000 IU of vitamin D daily.  He plans to see a holistic provider for lab work  and will bring results to his next appointment. He wishes to taper and discontinue Lexapro . Left knee surgery is scheduled for October 2025.  Review of Systems:  Review of Systems  Constitutional: Negative.   HENT: Negative.    Eyes: Negative.   Respiratory: Negative.    Cardiovascular: Negative.   Gastrointestinal:  Positive for heartburn.  Genitourinary: Negative.   Musculoskeletal:  Positive for falls.  Neurological: Negative.    Psychiatric/Behavioral: Negative.      Past Medical History:  Diagnosis Date   Arthritis    Knees   COPD (chronic obstructive pulmonary disease) (HCC)    Does use hearing aid    Bilateral   Dysrhythmia    Enlarged prostate    GERD (gastroesophageal reflux disease)    Hypercholesterolemia    Hypertension    Sleep apnea    Past Surgical History:  Procedure Laterality Date   CHEEK AUGMENTATION Left 1970   REstructure of cheek bone    COLONOSCOPY  2017   Dr. Nellie   COLONOSCOPY WITH PROPOFOL  N/A 05/05/2022   Procedure: COLONOSCOPY WITH PROPOFOL ;  Surgeon: Jinny Carmine, MD;  Location: [facility] END[facility];  Service: Endoscopy;  Laterality: N/A;   ESOPHAGOGASTRODUODENOSCOPY (EGD) WITH PROPOFOL  N/A 12/05/2020   Procedure: ESOPHAGOGASTRODUODENOSCOPY (EGD) WITH PROPOFOL ;  Surgeon: Jinny Carmine, MD;  Location: [facility];  Service: Endoscopy;  Laterality: N/A;   EYE SURGERY Bilateral 2005   NASAL SEPTOPLASTY W/ TURBINOPLASTY Bilateral 12/24/2020   Procedure: NASAL SEPTOPLASTY WITH INFERIOR TURBINATE REDUCTION;  Surgeon: Milissa Hamming, MD;  Location: [facility] OR[facility];  Service: ENT;  Laterality: Bilateral;   TOTAL KNEE ARTHROPLASTY Right 12/17/2023   Procedure: ARTHROPLASTY, KNEE, TOTAL;  Surgeon: Kay Kemps, MD;  Location: [facility];  Service: Orthopedics;  Laterality: Right;   UPPER GI ENDOSCOPY     Social History:   reports that he quit smoking about 40 years ago. His smoking use included cigarettes. He started smoking about 75 years  ago. He has a 35 pack-year smoking history. He has never used smokeless tobacco. He reports that he does not currently use alcohol. He reports that he does not use drugs.  Family History  Problem Relation Age of Onset   Alzheimer's disease Mother    Diabetes Mother    Heart failure Father    Heart attack Father    Diabetes Father    Dementia Father     Medications: Patient's Medications  New Prescriptions   No medications on file  Previous Medications    ACETAMINOPHEN  (TYLENOL ) 650 MG CR TABLET    Take 650 mg by mouth 3 (three) times daily.   ALBUTEROL  (PROVENTIL ) (2.5 MG/3ML) 0.083% NEBULIZER SOLUTION    Take 3 mLs (2.5 mg total) by nebulization every 6 (six) hours as needed for wheezing or shortness of breath.   CALCIUM CARBONATE (TUMS - DOSED IN MG ELEMENTAL CALCIUM) 500 MG CHEWABLE TABLET    Chew 2 tablets by mouth every 4 (four) hours as needed for indigestion or heartburn.   CHOLECALCIFEROL (VITAMIN D3) 125 MCG (5000 UT) CAPS    Take 1 capsule by mouth daily.   CLOTRIMAZOLE -BETAMETHASONE  (LOTRISONE ) CREAM    Apply 1 Application topically 2 (two) times daily as needed.   CYANOCOBALAMIN  (VITAMIN B12) 500 MCG TABLET    Take 500 mcg by mouth daily.   CYCLOBENZAPRINE  (FLEXERIL ) 10 MG TABLET    TAKE 1 TABLET BY MOUTH TWICE  DAILY AS NEEDED FOR MUSCLE  SPASM(S)   ESCITALOPRAM  (LEXAPRO ) 20 MG TABLET    Take 0.5 tablets (10 mg total) by mouth daily.   FINASTERIDE  (PROSCAR ) 5 MG TABLET    TAKE 1 TABLET EVERY DAY   FLUTICASONE  (FLONASE ) 50 MCG/ACT NASAL SPRAY    USE 1 SPRAY IN BOTH NOSTRILS DAILY   LOSARTAN  (COZAAR ) 100 MG TABLET    Take 1 tablet (100 mg total) by mouth daily.   MELATONIN 5 MG TABS    Take 5 mg by mouth at bedtime.    METHYLSULFONYLMETHANE (MSM) 1000 MG CAPS    Take 1 capsule by mouth 2 (two) times daily.   METOPROLOL  SUCCINATE (TOPROL -XL) 25 MG 24 HR TABLET    Take 25 mg by mouth daily.   MULTIPLE VITAMINS-MINERALS (MULTIVITAMIN MEN 50+) TABS    Take 1 tablet by mouth daily.   PANTOPRAZOLE  (PROTONIX ) 40 MG TABLET    TAKE 1 TABLET EVERY DAY   POLYETHYLENE GLYCOL (MIRALAX  / GLYCOLAX ) 17 G PACKET    Take 17 g by mouth daily.   SIMVASTATIN  (ZOCOR ) 40 MG TABLET    Take 1 tablet (40 mg total) by mouth daily.   TAMSULOSIN  (FLOMAX ) 0.4 MG CAPS CAPSULE    TAKE 1 CAPSULE EVERY DAY   TIOTROPIUM BROMIDE  MONOHYDRATE (SPIRIVA  RESPIMAT) 2.5 MCG/ACT AERS    Inhale 2 puffs into the lungs daily.   TRIAMCINOLONE  CREAM (KENALOG ) 0.1 %    APPLY  CREAM  EXTERNALLY TO AFFECTED AREA TWICE DAILY   VALACYCLOVIR  (VALTREX ) 1000 MG TABLET    Take 2 tablets (2,000 mg total) by mouth 2 (two) times daily.  Modified Medications   No medications on file  Discontinued Medications   No medications on file    Physical Exam:  Vitals:   04/04/24 1045  BP: 134/82  Pulse: 67  Temp: 97.6 F (36.4 C)  SpO2: 97%  Weight: 189 lb 12.8 oz (86.1 kg)  Height: 6' (1.829 m)   Body mass index is 25.74 kg/m. Wt Readings from Last 3 Encounters:  04/04/24 189 lb 12.8 oz (86.1 kg)  03/14/24 191 lb 9.6 oz (86.9 kg)  03/10/24 196 lb 3.2 oz (89 kg)    Physical Exam Constitutional:      Appearance: Normal appearance.  HENT:     Head: Normocephalic and atraumatic.     Nose: Nose normal.     Mouth/Throat:     Mouth: Mucous membranes are moist.     Pharynx: Oropharynx is clear.  Eyes:     Conjunctiva/sclera: Conjunctivae normal.  Cardiovascular:     Rate and Rhythm: Normal rate and regular rhythm.     Pulses: Normal pulses.     Heart sounds: Normal heart sounds.  Pulmonary:     Breath sounds: Normal breath sounds.  Chest:     Chest wall: Tenderness present.     Comments: Tenderness to left lower chest from recent fall, small healing bruise noted. Abdominal:     General: Bowel sounds are normal.     Palpations: Abdomen is soft.  Skin:    General: Skin is warm and dry.  Neurological:     Mental Status: He is alert and oriented to person, place, and time. Mental status is at baseline.  Psychiatric:        Mood and Affect: Mood normal.        Behavior: Behavior normal.    Labs reviewed: Basic Metabolic Panel: Recent Labs    10/11/23 0745 12/13/23 1039  NA 140 137  K 3.9 4.2  CL 108 108  CO2 24 22  GLUCOSE 92 85  BUN 22 15  CREATININE 0.77 0.85  CALCIUM 9.4 9.3   Liver Function Tests: Recent Labs    10/11/23 0745  AST 17  ALT 16  BILITOT 1.1  PROT 6.8   No results for input(s): LIPASE, AMYLASE in the last 8760 hours. No  results for input(s): AMMONIA in the last 8760 hours. CBC: Recent Labs    10/11/23 0745 12/13/23 1039  WBC 6.2 5.9  NEUTROABS 3,174  --   HGB 15.6 15.0  HCT 45.7 44.6  MCV 91.8 95.5  PLT 133* 159   Lipid Panel: Recent Labs    10/11/23 0745  CHOL 195  HDL 48  LDLCALC 124*  TRIG 115  CHOLHDL 4.1   TSH: No results for input(s): TSH in the last 8760 hours. A1C: No results found for: HGBA1C   Assessment/Plan  1. Rib pain on left side (Primary) - Chest X-ray ordered (chest, 2 views) to rule out fracture - Use Tylenol  as needed for rib pain  2. Essential hypertension - Chronic and stable - Patient has self-discontinued metoprolol  and remains controlled - Continue losartan  - Most recent CMP in June 2025  3. Mixed hyperlipidemia - Chronic and stable - Last lipid profile March 2025 - Continue simvastatin   4. Gastroesophageal reflux disease without esophagitis - Chronic; patient stopped Protonix  - Take TUMS as needed for heartburn - No new medications needed at this time -continue dietary modifications  5. Benign prostatic hyperplasia with urinary frequency - Chronic and stable - Continue tamsulosin  and finasteride   6. Chronic obstructive pulmonary disease, unspecified COPD type (HCC) - Chronic and stable - Continue Spiriva  inhaler - Use albuterol  nebulizer as needed  7. Moderate obstructive sleep apnea - Stable and chronic - Continue nightly CPAP  8. Primary osteoarthritis of both knees - Right knee: post-replacement - Left knee: replacement scheduled October 2025 - Maintain activity and use Tylenol  as needed  9. Anxiety - Stable; continue Lexapro  - Patient  states good control, plans slow self-wean    Routine follow-up in 6 months.  Joshua Rabon, RN DNP-AGPCNP Student I personally was present during the history, physical exam and medical decision-making activities of this service and have verified that the service and findings are accurately  documented in the student's note Nichole Keltner K. Joshua BODILY [facility] [phone]

## 2024-04-05 ENCOUNTER — Ambulatory Visit: Payer: Self-pay | Admitting: Nurse Practitioner

## 2024-04-10 ENCOUNTER — Other Ambulatory Visit: Payer: Self-pay | Admitting: Nurse Practitioner

## 2024-04-10 NOTE — Telephone Encounter (Signed)
 Patient has request refill on medication that's not on his list.

## 2024-04-11 ENCOUNTER — Encounter: Admitting: Nurse Practitioner

## 2024-04-13 ENCOUNTER — Non-Acute Institutional Stay: Admitting: Nurse Practitioner

## 2024-04-13 ENCOUNTER — Encounter: Payer: Self-pay | Admitting: Nurse Practitioner

## 2024-04-13 VITALS — BP 136/74 | HR 76 | Temp 97.8°F | Ht 72.0 in | Wt 187.6 lb

## 2024-04-13 DIAGNOSIS — R600 Localized edema: Secondary | ICD-10-CM

## 2024-04-13 DIAGNOSIS — I10 Essential (primary) hypertension: Secondary | ICD-10-CM | POA: Diagnosis not present

## 2024-04-13 DIAGNOSIS — R0789 Other chest pain: Secondary | ICD-10-CM | POA: Diagnosis not present

## 2024-04-13 MED ORDER — LOSARTAN POTASSIUM 100 MG PO TABS
100.0000 mg | ORAL_TABLET | Freq: Every day | ORAL | 1 refills | Status: AC
Start: 2024-04-13 — End: ?

## 2024-04-13 MED ORDER — METOPROLOL SUCCINATE ER 25 MG PO TB24: 25.0000 mg | ORAL_TABLET | Freq: Every day | ORAL | 3 refills | Status: AC

## 2024-04-13 NOTE — Progress Notes (Signed)
 Careteam: Patient Care Team: Caro Harlene POUR, NP as PCP - General (Geriatric Medicine) Darliss Rogue, MD as PCP - Cardiology (Cardiology) PLACE OF SERVICE:  [facility]   Advanced Directive information    Allergies  Allergen Reactions   Bee Pollen Anaphylaxis    Bee Venom   Bee Venom     Throat/mouth swelling    Chief Complaint  Patient presents with   Hypertension    Complains of High Blood Pressure and Pain from a previous fall while on vacation.      HPI: Patient is a 78 y.o. male seen in today for follow up at [facility] Discussed the use of AI scribe software for clinical note transcription with the patient, who gave verbal consent to proceed.  History of Present Illness Joshua Mercado is an 78 year old male with hypertension and rib pain who presents for follow-up on blood pressure and pain management.  He has been monitoring his blood pressure, which was previously elevated, reaching the 170s/90s and 160s/100s. Recent readings show improvement with 153/90 on October 1st, 136/91 yesterday, and 136/74 today. He is currently taking losartan  100 mg for blood pressure management and had previously stopped metoprolol .  He experiences rib pain that originated from an injury where he landed on his fist. The pain has since moved up his body and is now more pronounced in the chest wall area. This pain limits his ability to work out his upper body, as he can only perform one repetition before stopping due to pain.  He recently received a COVID-19 vaccination and reports soreness in his shoulder from the injection. He also has a cough related to COPD, which is painful when he coughs, but there are no changes in his COPD symptoms. He wears compression stockings to manage leg swelling and notes that the veins in his leg, which is scheduled for surgery on October 24th, become less prominent when the stockings are worn.      Review of Systems:  Review of Systems   Constitutional:  Negative for chills, fever and weight loss.  HENT:  Negative for tinnitus.   Respiratory:  Negative for cough, sputum production and shortness of breath.   Cardiovascular:  Negative for chest pain, palpitations and leg swelling.  Gastrointestinal:  Negative for abdominal pain, constipation, diarrhea and heartburn.  Genitourinary:  Negative for dysuria, frequency and urgency.  Musculoskeletal:  Negative for back pain, falls, joint pain and myalgias.       Tenderness to left chest wall after fall  Skin: Negative.   Neurological:  Negative for dizziness and headaches.  Psychiatric/Behavioral:  Negative for depression and memory loss. The patient does not have insomnia.     Past Medical History:  Diagnosis Date   Arthritis    Knees   COPD (chronic obstructive pulmonary disease) (HCC)    Does use hearing aid    Bilateral   Dysrhythmia    Enlarged prostate    GERD (gastroesophageal reflux disease)    Hypercholesterolemia    Hypertension    Sleep apnea    Past Surgical History:  Procedure Laterality Date   CHEEK AUGMENTATION Left 1970   REstructure of cheek bone    COLONOSCOPY  2017   Dr. Nellie   COLONOSCOPY WITH PROPOFOL  N/A 05/05/2022   Procedure: COLONOSCOPY WITH PROPOFOL ;  Surgeon: Jinny Carmine, MD;  Location: [facility] ENDOSCOPY;  Service: Endoscopy;  Laterality: N/A;   ESOPHAGOGASTRODUODENOSCOPY (EGD) WITH PROPOFOL  N/A 12/05/2020   Procedure: ESOPHAGOGASTRODUODENOSCOPY (EGD) WITH  PROPOFOL ;  Surgeon: Jinny Carmine, MD;  Location: [facility] ENDOSCOPY;  Service: Endoscopy;  Laterality: N/A;   EYE SURGERY Bilateral 2005   NASAL SEPTOPLASTY W/ TURBINOPLASTY Bilateral 12/24/2020   Procedure: NASAL SEPTOPLASTY WITH INFERIOR TURBINATE REDUCTION;  Surgeon: Milissa Hamming, MD;  Location: [facility] ORS;  Service: ENT;  Laterality: Bilateral;   TOTAL KNEE ARTHROPLASTY Right 12/17/2023   Procedure: ARTHROPLASTY, KNEE, TOTAL;  Surgeon: Kay Kemps, MD;  Location: [facility] ORS;  Service:  Orthopedics;  Laterality: Right;   UPPER GI ENDOSCOPY     Social History:   reports that he quit smoking about 40 years ago. His smoking use included cigarettes. He started smoking about 75 years ago. He has a 35 pack-year smoking history. He has never used smokeless tobacco. He reports that he does not currently use alcohol. He reports that he does not use drugs.  Family History  Problem Relation Age of Onset   Alzheimer's disease Mother    Diabetes Mother    Heart failure Father    Heart attack Father    Diabetes Father    Dementia Father     Medications: Patient's Medications  New Prescriptions   No medications on file  Previous Medications   ACETAMINOPHEN  (TYLENOL ) 650 MG CR TABLET    Take 650 mg by mouth 3 (three) times daily.   ALBUTEROL  (PROVENTIL ) (2.5 MG/3ML) 0.083% NEBULIZER SOLUTION    Take 3 mLs (2.5 mg total) by nebulization every 6 (six) hours as needed for wheezing or shortness of breath.   CALCIUM CARBONATE (TUMS - DOSED IN MG ELEMENTAL CALCIUM) 500 MG CHEWABLE TABLET    Chew 2 tablets by mouth every 4 (four) hours as needed for indigestion or heartburn.   CHOLECALCIFEROL (VITAMIN D3) 125 MCG (5000 UT) CAPS    Take 1 capsule by mouth daily.   CLOTRIMAZOLE -BETAMETHASONE  (LOTRISONE ) CREAM    Apply 1 Application topically 2 (two) times daily as needed.   CYANOCOBALAMIN  (VITAMIN B12) 500 MCG TABLET    Take 500 mcg by mouth daily.   CYCLOBENZAPRINE  (FLEXERIL ) 10 MG TABLET    TAKE 1 TABLET BY MOUTH TWICE  DAILY AS NEEDED FOR MUSCLE  SPASM(S)   ESCITALOPRAM  (LEXAPRO ) 20 MG TABLET    Take 0.5 tablets (10 mg total) by mouth daily.   FINASTERIDE  (PROSCAR ) 5 MG TABLET    TAKE 1 TABLET EVERY DAY   FLUTICASONE  (FLONASE ) 50 MCG/ACT NASAL SPRAY    USE 1 SPRAY IN BOTH NOSTRILS DAILY   LOSARTAN  (COZAAR ) 100 MG TABLET    Take 1 tablet (100 mg total) by mouth daily.   MELATONIN 5 MG TABS    Take 5 mg by mouth at bedtime.    METHYLSULFONYLMETHANE (MSM) 1000 MG CAPS    Take 1 capsule by  mouth 2 (two) times daily.   MULTIPLE VITAMINS-MINERALS (MULTIVITAMIN MEN 50+) TABS    Take 1 tablet by mouth daily.   PANTOPRAZOLE  (PROTONIX ) 40 MG TABLET    TAKE 1 TABLET EVERY DAY   POLYETHYLENE GLYCOL (MIRALAX  / GLYCOLAX ) 17 G PACKET    Take 17 g by mouth daily.   SIMVASTATIN  (ZOCOR ) 40 MG TABLET    Take 1 tablet (40 mg total) by mouth daily.   TAMSULOSIN  (FLOMAX ) 0.4 MG CAPS CAPSULE    TAKE 1 CAPSULE EVERY DAY   TIOTROPIUM BROMIDE  MONOHYDRATE (SPIRIVA  RESPIMAT) 2.5 MCG/ACT AERS    Inhale 2 puffs into the lungs daily.   TRIAMCINOLONE  CREAM (KENALOG ) 0.1 %    APPLY  CREAM EXTERNALLY TO AFFECTED  AREA TWICE DAILY   VALACYCLOVIR  (VALTREX ) 1000 MG TABLET    Take 2 tablets (2,000 mg total) by mouth 2 (two) times daily.  Modified Medications   No medications on file  Discontinued Medications   No medications on file    Physical Exam:  Vitals:   04/13/24 0822  BP: 136/74  Pulse: 76  Temp: 97.8 F (36.6 C)  SpO2: 99%  Weight: 187 lb 9.6 oz (85.1 kg)  Height: 6' (1.829 m)   Body mass index is 25.44 kg/m. Wt Readings from Last 3 Encounters:  04/13/24 187 lb 9.6 oz (85.1 kg)  04/04/24 189 lb 12.8 oz (86.1 kg)  03/14/24 191 lb 9.6 oz (86.9 kg)    Physical Exam Constitutional:      General: He is not in acute distress.    Appearance: He is well-developed. He is not diaphoretic.  HENT:     Head: Normocephalic and atraumatic.     Right Ear: External ear normal.     Left Ear: External ear normal.     Mouth/Throat:     Pharynx: No oropharyngeal exudate.  Eyes:     Conjunctiva/sclera: Conjunctivae normal.     Pupils: Pupils are equal, round, and reactive to light.  Cardiovascular:     Rate and Rhythm: Normal rate and regular rhythm.     Heart sounds: Normal heart sounds.  Pulmonary:     Effort: Pulmonary effort is normal.     Breath sounds: Normal breath sounds.  Abdominal:     General: Bowel sounds are normal.     Palpations: Abdomen is soft.  Musculoskeletal:         General: No tenderness.     Cervical back: Normal range of motion and neck supple.     Right lower leg: No edema.     Left lower leg: No edema.  Skin:    General: Skin is warm and dry.  Neurological:     Mental Status: He is alert and oriented to person, place, and time.     Labs reviewed: Basic Metabolic Panel: Recent Labs    10/11/23 0745 12/13/23 1039  NA 140 137  K 3.9 4.2  CL 108 108  CO2 24 22  GLUCOSE 92 85  BUN 22 15  CREATININE 0.77 0.85  CALCIUM 9.4 9.3   Liver Function Tests: Recent Labs    10/11/23 0745  AST 17  ALT 16  BILITOT 1.1  PROT 6.8   No results for input(s): LIPASE, AMYLASE in the last 8760 hours. No results for input(s): AMMONIA in the last 8760 hours. CBC: Recent Labs    10/11/23 0745 12/13/23 1039  WBC 6.2 5.9  NEUTROABS 3,174  --   HGB 15.6 15.0  HCT 45.7 44.6  MCV 91.8 95.5  PLT 133* 159   Lipid Panel: Recent Labs    10/11/23 0745  CHOL 195  HDL 48  LDLCALC 124*  TRIG 115  CHOLHDL 4.1   TSH: No results for input(s): TSH in the last 8760 hours. A1C: No results found for: HGBA1C   Assessment/Plan Assessment and Plan Assessment & Plan Essential hypertension Blood pressure improved to 136/74. Losartan  100 mg continued.  - Restart metoprolol  if BP > 140/90. - MyChart message provider if BP remains elevated.  Rib and chest wall pain Pain persists after fall, now more pronounced in chest wall. Aggravated by upper body exercise. - Rest and avoid activities that exacerbate pain. - Apply ice to reduce inflammation. - Avoid upper body  exercises while in pain - Focus on leg exercises.  Chronic obstructive pulmonary disease (COPD) No changes in symptoms. Coughing painful due to rib pain.  Lower extremity edema and varicose veins Compression stockings reduce vein prominence and swelling. - Continue wearing compression stockings.   Trinten Boudoin K. Caro BODILY  [facility] [phone]

## 2024-04-18 DIAGNOSIS — M1712 Unilateral primary osteoarthritis, left knee: Secondary | ICD-10-CM | POA: Diagnosis not present

## 2024-04-18 NOTE — Progress Notes (Signed)
 Assessment & Plan:  1. Stage 1 mild COPD by GOLD classification (HCC) (Primary) - Ambulatory Referral for DME  2. OSA on CPAP - Ambulatory Referral for DME   There are no Patient Instructions on file for this visit.  Please note: late entry documentation due to logistical difficulties during COVID-19 pandemic. This note is filed for information purposes only, and is not intended to be used for billing, nor does it represent the full scope/nature of the visit in question. Please see any associated scanned media linked to date of encounter for additional pertinent information.  Subjective:    HPI: Joshua Mercado is a 78 y.o. male presenting to the pulmonology clinic on 01/23/2020 with report of: Follow-up (sob at night, coughing, difficulty tolerating CPAP)     Outpatient Encounter Medications as of 01/23/2020  Medication Sig   melatonin 5 MG TABS Take 5 mg by mouth at bedtime.    Methylsulfonylmethane (MSM) 1000 MG CAPS Take 1 capsule by mouth 2 (two) times daily.   [DISCONTINUED] acetaminophen  (TYLENOL ) 500 MG tablet Take 500 mg by mouth as needed.   [DISCONTINUED] albuterol  (PROVENTIL ) (2.5 MG/3ML) 0.083% nebulizer solution Take 3 mLs (2.5 mg total) by nebulization daily as needed for wheezing or shortness of breath.   [DISCONTINUED] albuterol  (VENTOLIN  HFA) 108 (90 Base) MCG/ACT inhaler Inhale 2 puffs into the lungs every 4 (four) hours as needed for wheezing or shortness of breath.   [DISCONTINUED] Apoaequorin (PREVAGEN) 10 MG CAPS Take 1 capsule by mouth daily.    [DISCONTINUED] Ascorbic Acid  (VITAMIN C ) 1000 MG tablet Take 1,000 mg by mouth daily.   [DISCONTINUED] aspirin  EC 81 MG tablet Take 81 mg by mouth daily.   [DISCONTINUED] B Complex-C (SUPER B-COMPLEX + VITAMIN C  PO) Take by mouth.   [DISCONTINUED] Biotin 1000 MCG tablet Take 1,000 mcg by mouth daily.    [DISCONTINUED] calcium elemental as carbonate (BARIATRIC TUMS ULTRA) 400 MG chewable tablet Chew 1,000 mg by mouth  daily as needed.    [DISCONTINUED] cetaphil (CETAPHIL) cream Apply topically as needed. (Patient not taking: Reported on 12/16/2021)   [DISCONTINUED] Cod Liver Oil/Vitamins A & D CAPS Take by mouth.   [DISCONTINUED] cyclobenzaprine  (FLEXERIL ) 10 MG tablet Take 1 tablet (10 mg total) by mouth 2 (two) times daily as needed for muscle spasms.   [DISCONTINUED] famotidine  (PEPCID ) 20 MG tablet Take 20 mg by mouth. (Patient not taking: Reported on 12/16/2021)   [DISCONTINUED] finasteride  (PROSCAR ) 5 MG tablet Take 1 tablet (5 mg total) by mouth daily.   [DISCONTINUED] fluticasone  (FLONASE ) 50 MCG/ACT nasal spray Place 1 spray into both nostrils daily.   [DISCONTINUED] Garlic 1000 MG CAPS Take by mouth.   [DISCONTINUED] loratadine  (CLARITIN ) 10 MG tablet Take 1 tablet (10 mg total) by mouth daily.   [DISCONTINUED] milk thistle 175 MG tablet Take 175 mg by mouth daily.   [DISCONTINUED] Misc Natural Products (OSTEO BI-FLEX ADV TRIPLE ST) TABS Take by mouth.   [DISCONTINUED] Multiple Vitamins-Minerals (ONE DAILY MENS HEALTH) TABS Take by mouth.   [DISCONTINUED] nabumetone  (RELAFEN ) 750 MG tablet Take 2 tablets (1,500 mg total) by mouth daily. 2 tablets to = 1500 mg   [DISCONTINUED] Omega-3 1000 MG CAPS Take by mouth.   [DISCONTINUED] Saw Palmetto 450 MG CAPS Take by mouth.   [DISCONTINUED] selenium  200 MCG TABS tablet Take by mouth daily.   [DISCONTINUED] simvastatin  (ZOCOR ) 40 MG tablet Take 1 tablet (40 mg total) by mouth daily.   [DISCONTINUED] tadalafil  (CIALIS ) 20 MG tablet Take 1  tablet (20 mg total) by mouth daily as needed for erectile dysfunction.   [DISCONTINUED] tamsulosin  (FLOMAX ) 0.4 MG CAPS capsule Take 1 capsule (0.4 mg total) by mouth daily.   [DISCONTINUED] temazepam  (RESTORIL ) 15 MG capsule Take 1 capsule (15 mg total) by mouth at bedtime as needed for sleep.   [DISCONTINUED] Tiotropium Bromide  Monohydrate (SPIRIVA  RESPIMAT) 2.5 MCG/ACT AERS Inhale 2 puffs into the lungs daily.    [DISCONTINUED] vitamin B-12 (CYANOCOBALAMIN ) 500 MCG tablet Take 500 mcg by mouth daily.   [DISCONTINUED] Vitamin E 180 MG CAPS Take by mouth.   No facility-administered encounter medications on file as of 01/23/2020.      Objective:   Vitals:   01/23/20 1102  BP: 110/80  Pulse: 74  Temp: 97.8 F (36.6 C)  Height: 6' (1.829 m)  Weight: 214 lb (97.1 kg)  SpO2: 97%  TempSrc: Oral  BMI (Calculated): 29.02     Physical exam documentation is limited by delayed entry of information.

## 2024-04-19 DIAGNOSIS — M19041 Primary osteoarthritis, right hand: Secondary | ICD-10-CM | POA: Diagnosis not present

## 2024-04-19 DIAGNOSIS — M67441 Ganglion, right hand: Secondary | ICD-10-CM | POA: Diagnosis not present

## 2024-04-20 DIAGNOSIS — Z818 Family history of other mental and behavioral disorders: Secondary | ICD-10-CM | POA: Diagnosis not present

## 2024-04-20 DIAGNOSIS — Z79899 Other long term (current) drug therapy: Secondary | ICD-10-CM | POA: Diagnosis not present

## 2024-04-20 DIAGNOSIS — G3184 Mild cognitive impairment, so stated: Secondary | ICD-10-CM | POA: Diagnosis not present

## 2024-04-20 DIAGNOSIS — A938 Other specified arthropod-borne viral fevers: Secondary | ICD-10-CM | POA: Diagnosis not present

## 2024-04-20 DIAGNOSIS — E7800 Pure hypercholesterolemia, unspecified: Secondary | ICD-10-CM | POA: Diagnosis not present

## 2024-04-20 DIAGNOSIS — I10 Essential (primary) hypertension: Secondary | ICD-10-CM | POA: Diagnosis not present

## 2024-04-23 NOTE — H&P (Signed)
 Patient's anticipated LOS is less than 2 midnights, meeting these requirements: - Younger than 3 - Lives within 1 hour of care - Has a competent adult at home to recover with post-op recover - NO history of  - Chronic pain requiring opiods  - Diabetes  - Coronary Artery Disease  - Heart failure  - Heart attack  - Stroke  - DVT/VTE  - Cardiac arrhythmia  - Respiratory Failure/COPD  - Renal failure  - Anemia  - Advanced Liver disease     Joshua Mercado is an 78 y.o. male.    Chief Complaint: left knee pain  HPI: Pt is a 78 y.o. male complaining of left knee pain for multiple years. Pain had continually increased since the beginning. X-rays in the clinic show end-stage arthritic changes of the left knee. Pt has tried various conservative treatments which have failed to alleviate their symptoms, including injections and therapy. Various options are discussed with the patient. Risks, benefits and expectations were discussed with the patient. Patient understand the risks, benefits and expectations and wishes to proceed with surgery.   PCP:  Caro Harlene POUR, NP  D/C Plans: Home  PMH: Past Medical History:  Diagnosis Date   Arthritis    Knees   COPD (chronic obstructive pulmonary disease) (HCC)    Does use hearing aid    Bilateral   Dysrhythmia    Enlarged prostate    GERD (gastroesophageal reflux disease)    Hypercholesterolemia    Hypertension    Sleep apnea     PSH: Past Surgical History:  Procedure Laterality Date   CHEEK AUGMENTATION Left 1970   REstructure of cheek bone    COLONOSCOPY  2017   Dr. Nellie   COLONOSCOPY WITH PROPOFOL  N/A 05/05/2022   Procedure: COLONOSCOPY WITH PROPOFOL ;  Surgeon: Jinny Carmine, MD;  Location: [facility] ENDOSCOPY;  Service: Endoscopy;  Laterality: N/A;   ESOPHAGOGASTRODUODENOSCOPY (EGD) WITH PROPOFOL  N/A 12/05/2020   Procedure: ESOPHAGOGASTRODUODENOSCOPY (EGD) WITH PROPOFOL ;  Surgeon: Jinny Carmine, MD;  Location: [facility] ENDOSCOPY;  Service:  Endoscopy;  Laterality: N/A;   EYE SURGERY Bilateral 2005   NASAL SEPTOPLASTY W/ TURBINOPLASTY Bilateral 12/24/2020   Procedure: NASAL SEPTOPLASTY WITH INFERIOR TURBINATE REDUCTION;  Surgeon: Milissa Hamming, MD;  Location: [facility] ORS;  Service: ENT;  Laterality: Bilateral;   TOTAL KNEE ARTHROPLASTY Right 12/17/2023   Procedure: ARTHROPLASTY, KNEE, TOTAL;  Surgeon: Kay Kemps, MD;  Location: [facility] ORS;  Service: Orthopedics;  Laterality: Right;   UPPER GI ENDOSCOPY      Social History:  reports that he quit smoking about 40 years ago. His smoking use included cigarettes. He started smoking about 75 years ago. He has a 35 pack-year smoking history. He has never used smokeless tobacco. He reports that he does not currently use alcohol. He reports that he does not use drugs. BMI: Estimated body mass index is 25.44 kg/m as calculated from the following:   Height as of 04/13/24: 6' (1.829 m).   Weight as of 04/13/24: 85.1 kg.  Lab Results  Component Value Date   ALBUMIN 4.5 08/27/2022   Diabetes: Patient does not have a diagnosis of diabetes.     Smoking Status:      Allergies:  Allergies  Allergen Reactions   Bee Pollen Anaphylaxis    Bee Venom   Bee Venom     Throat/mouth swelling    Medications: No current facility-administered medications for this encounter.   Current Outpatient Medications  Medication Sig Dispense Refill   acetaminophen  (TYLENOL ) 650 MG  CR tablet Take 650 mg by mouth 3 (three) times daily.     albuterol  (PROVENTIL ) (2.5 MG/3ML) 0.083% nebulizer solution Take 3 mLs (2.5 mg total) by nebulization every 6 (six) hours as needed for wheezing or shortness of breath. 3 mL 0   calcium carbonate (TUMS - DOSED IN MG ELEMENTAL CALCIUM) 500 MG chewable tablet Chew 2 tablets by mouth every 4 (four) hours as needed for indigestion or heartburn.     Cholecalciferol (VITAMIN D3) 125 MCG (5000 UT) CAPS Take 1 capsule by mouth daily.     clotrimazole -betamethasone  (LOTRISONE )  cream Apply 1 Application topically 2 (two) times daily as needed.     cyanocobalamin  (VITAMIN B12) 500 MCG tablet Take 500 mcg by mouth daily.     cyclobenzaprine  (FLEXERIL ) 10 MG tablet TAKE 1 TABLET BY MOUTH TWICE  DAILY AS NEEDED FOR MUSCLE  SPASM(S) 60 tablet 3   escitalopram  (LEXAPRO ) 20 MG tablet Take 0.5 tablets (10 mg total) by mouth daily. 45 tablet 1   finasteride  (PROSCAR ) 5 MG tablet TAKE 1 TABLET EVERY DAY 90 tablet 3   fluticasone  (FLONASE ) 50 MCG/ACT nasal spray USE 1 SPRAY IN BOTH NOSTRILS DAILY 32 g 3   losartan  (COZAAR ) 100 MG tablet Take 1 tablet (100 mg total) by mouth daily. 90 tablet 1   melatonin 5 MG TABS Take 5 mg by mouth at bedtime.      Methylsulfonylmethane (MSM) 1000 MG CAPS Take 1 capsule by mouth 2 (two) times daily.     metoprolol  succinate (TOPROL -XL) 25 MG 24 hr tablet Take 1 tablet (25 mg total) by mouth daily. 90 tablet 3   Multiple Vitamins-Minerals (MULTIVITAMIN MEN 50+) TABS Take 1 tablet by mouth daily.     pantoprazole  (PROTONIX ) 40 MG tablet TAKE 1 TABLET EVERY DAY 90 tablet 3   polyethylene glycol (MIRALAX  / GLYCOLAX ) 17 g packet Take 17 g by mouth daily.     simvastatin  (ZOCOR ) 40 MG tablet Take 1 tablet (40 mg total) by mouth daily. 90 tablet 1   tamsulosin  (FLOMAX ) 0.4 MG CAPS capsule TAKE 1 CAPSULE EVERY DAY 90 capsule 3   Tiotropium Bromide  Monohydrate (SPIRIVA  RESPIMAT) 2.5 MCG/ACT AERS Inhale 2 puffs into the lungs daily. 12 g 3   triamcinolone  cream (KENALOG ) 0.1 % APPLY  CREAM EXTERNALLY TO AFFECTED AREA TWICE DAILY 80 g 1   valACYclovir  (VALTREX ) 1000 MG tablet Take 2 tablets (2,000 mg total) by mouth 2 (two) times daily. 4 tablet 2    No results found for this or any previous visit (from the past 48 hours). No results found.  ROS: Pain with rom of the left lower extremity  Physical Exam: Alert and oriented 78 y.o. male in no acute distress Cranial nerves 2-12 intact Cervical spine: full rom with no tenderness, nv intact  distally Chest: active breath sounds bilaterally, no wheeze rhonchi or rales Heart: regular rate and rhythm, no murmur Abd: non tender non distended with active bowel sounds Hip is stable with rom  Left knee painful rom with crepitus Antalgic gait  Assessment/Plan Assessment: left knee end stage osteoarthritis  Plan:  Patient will undergo a left total knee by Dr. Kay at [place] Risks benefits and expectations were discussed with the patient. Patient understand risks, benefits and expectations and wishes to proceed. Preoperative templating of the joint replacement has been completed, documented, and submitted to the Operating Room personnel in order to optimize intra-operative equipment management.   Brad Rockland Kotarski PA-C, MPAS [employer] [facility] now [employer]  [place] [address], [address], [place], [place] [address] Phone: [phone] [email or web address] Facebook  [employer]

## 2024-04-28 NOTE — Patient Instructions (Signed)
 SURGICAL WAITING ROOM VISITATION Patients having surgery or a procedure may have no more than 2 support people in the waiting area - these visitors may rotate in the visitor waiting room.   Due to an increase in RSV and influenza rates and associated hospitalizations, children ages 1 and under may not visit patients in [facility] hospitals. If the patient needs to stay at the hospital during part of their recovery, the visitor guidelines for inpatient rooms apply.  PRE-OP VISITATION  Pre-op nurse will coordinate an appropriate time for 1 support person to accompany the patient in pre-op.  This support person may not rotate.  This visitor will be contacted when the time is appropriate for the visitor to come back in the pre-op area.  Please refer to the [facility] website for the visitor guidelines for Inpatients (after your surgery is over and you are in a regular room).  You are not required to quarantine at this time prior to your surgery. However, you must do this: Hand Hygiene often Do NOT share personal items Notify your provider if you are in close contact with someone who has COVID or you develop fever 100.4 or greater, new onset of sneezing, cough, sore throat, shortness of breath or body aches.  If you test positive for Covid or have been in contact with anyone that has tested positive in the last 10 days please notify you surgeon.    Your procedure is scheduled on:  05/05/24  Report to [facility] Main Entrance: [address] entrance where the [employer] is available.   Report to admitting at: 5:15 AM  Call this number if you have any questions or problems the morning of surgery [phone]  FOLLOW ANY ADDITIONAL PRE OP INSTRUCTIONS YOU RECEIVED FROM YOUR SURGEON'S OFFICE!!!  Do not eat food after Midnight the night prior to your surgery/procedure.  After Midnight you may have the following liquids until: 4:30 AM DAY OF SURGERY  Clear Liquid Diet Water  Black  Coffee (sugar ok, NO MILK/CREAM OR CREAMERS)  Tea (sugar ok, NO MILK/CREAM OR CREAMERS) regular and decaf                             Plain Jell-O  with no fruit (NO RED)                                           Fruit ices (not with fruit pulp, NO RED)                                     Popsicles (NO RED)                                                                  Juice: NO CITRUS JUICES: only apple, WHITE grape, WHITE cranberry Sports drinks like Gatorade or Powerade (NO RED)   The day of surgery:  Drink ONE (1) Pre-Surgery Clear Ensure at : 4:30  AM the morning of surgery. Drink in one sitting. Do not sip.  This drink was  given to you during your hospital pre-op appointment visit. Nothing else to drink after completing the Pre-Surgery Clear Ensure or G2 : No candy, chewing gum or throat lozenges.    Oral Hygiene is also important to reduce your risk of infection.        Remember - BRUSH YOUR TEETH THE MORNING OF SURGERY WITH YOUR REGULAR TOOTHPASTE  Do NOT smoke after Midnight the night before surgery.  STOP TAKING all Vitamins, Herbs and supplements 1 week before your surgery.   Take ONLY these medicines the morning of surgery with A SIP OF WATER : escitalopram ,metoprolol ,finasteride ,tamsulosin ,pantoprazole .Tylenol  as needed.Use inhalers as usual and bring them.  If You have been diagnosed with Sleep Apnea - Bring CPAP mask and tubing day of surgery. We will provide you with a CPAP machine on the day of your surgery.                   You may not have any metal on your body including hair pins, jewelry, and body piercing  Do not wear lotions, powders, perfumes / cologne, or deodorant  Men may shave face and neck.  Contacts, Hearing Aids, dentures or bridgework may not be worn into surgery. DENTURES WILL BE REMOVED PRIOR TO SURGERY PLEASE DO NOT APPLY Poly grip OR ADHESIVES!!!  You may bring a small overnight bag with you on the day of surgery, only pack items that are not  valuable. [facility] IS NOT RESPONSIBLE   FOR VALUABLES THAT ARE LOST OR STOLEN.   Patients discharged on the day of surgery will not be allowed to drive home.  Someone NEEDS to stay with you for the first 24 hours after anesthesia.  Do not bring your home medications to the hospital. The Pharmacy will dispense medications listed on your medication list to you during your admission in the Hospital.  Special Instructions: Bring a copy of your healthcare power of attorney and living will documents the day of surgery, if you wish to have them scanned into your [place] [facility] Records- EPIC  Please read over the following fact sheets you were given: IF YOU HAVE QUESTIONS ABOUT YOUR PRE-OP INSTRUCTIONS, PLEASE CALL [phone]  PATIENT SIGNATURE_________________________________  NURSE SIGNATURE__________________________________  ________________________________________________________________________  Pre-operative 4 CHG Bath Instructions  DYNA-Hex 4 Chlorhexidine  Gluconate 4% Solution Antiseptic 4 fl. oz   You can play a key role in reducing the risk of infection after surgery. Your skin needs to be as free of germs as possible. You can reduce the number of germs on your skin by washing with CHG (chlorhexidine  gluconate) soap before surgery. CHG is an antiseptic soap that kills germs and continues to kill germs even after washing.   DO NOT use if you have an allergy to chlorhexidine /CHG or antibacterial soaps. If your skin becomes reddened or irritated, stop using the CHG and notify one of our RNs at   Please shower with the CHG soap starting 4 days before surgery using the following schedule:     Please keep in mind the following:  DO NOT shave, including legs and underarms, starting the day of your first shower.   You may shave your face at any point before/day of surgery.  Place clean sheets on your bed the day you start using CHG soap. Use a clean washcloth (not used since being  washed) for each shower. DO NOT sleep with pets once you start using the CHG.  CHG Shower Instructions:  If you choose to wash your hair and private area,  wash first with your normal shampoo/soap.  After you use shampoo/soap, rinse your hair and body thoroughly to remove shampoo/soap residue.  Turn the water  OFF and apply about 3 tablespoons (45 ml) of CHG soap to a CLEAN washcloth.  Apply CHG soap ONLY FROM YOUR NECK DOWN TO YOUR TOES (washing for 3-5 minutes)  DO NOT use CHG soap on face, private areas, open wounds, or sores.  Pay special attention to the area where your surgery is being performed.  If you are having back surgery, having someone wash your back for you may be helpful. Wait 2 minutes after CHG soap is applied, then you may rinse off the CHG soap.  Pat dry with a clean towel  Put on clean clothes/pajamas   If you choose to wear lotion, please use ONLY the CHG-compatible lotions on the back of this paper.     Additional instructions for the day of surgery: DO NOT APPLY any lotions, deodorants, cologne, or perfumes.   Put on clean/comfortable clothes.  Brush your teeth.  Ask your nurse before applying any prescription medications to the skin.   CHG Compatible Lotions   Aveeno Moisturizing lotion  Cetaphil Moisturizing Cream  Cetaphil Moisturizing Lotion  Clairol Herbal Essence Moisturizing Lotion, Dry Skin  Clairol Herbal Essence Moisturizing Lotion, Extra Dry Skin  Clairol Herbal Essence Moisturizing Lotion, Normal Skin  Curel Age Defying Therapeutic Moisturizing Lotion with Alpha Hydroxy  Curel Extreme Care Body Lotion  Curel Soothing Hands Moisturizing Hand Lotion  Curel Therapeutic Moisturizing Cream, Fragrance-Free  Curel Therapeutic Moisturizing Lotion, Fragrance-Free  Curel Therapeutic Moisturizing Lotion, Original Formula  Eucerin Daily Replenishing Lotion  Eucerin Dry Skin Therapy Plus Alpha Hydroxy Crme  Eucerin Dry Skin Therapy Plus Alpha Hydroxy  Lotion  Eucerin Original Crme  Eucerin Original Lotion  Eucerin Plus Crme Eucerin Plus Lotion  Eucerin TriLipid Replenishing Lotion  Keri Anti-Bacterial Hand Lotion  Keri Deep Conditioning Original Lotion Dry Skin Formula Softly Scented  Keri Deep Conditioning Original Lotion, Fragrance Free Sensitive Skin Formula  Keri Lotion Fast Absorbing Fragrance Free Sensitive Skin Formula  Keri Lotion Fast Absorbing Softly Scented Dry Skin Formula  Keri Original Lotion  Keri Skin Renewal Lotion Keri Silky Smooth Lotion  Keri Silky Smooth Sensitive Skin Lotion  Nivea Body Creamy Conditioning Oil  Nivea Body Extra Enriched Lotion  Nivea Body Original Lotion  Nivea Body Sheer Moisturizing Lotion Nivea Crme  Nivea Skin Firming Lotion  NutraDerm 30 Skin Lotion  NutraDerm Skin Lotion  NutraDerm Therapeutic Skin Cream  NutraDerm Therapeutic Skin Lotion  ProShield Protective Hand Cream  Provon moisturizing lotion  Incentive Spirometer  An incentive spirometer is a tool that can help keep your lungs clear and active. This tool measures how well you are filling your lungs with each breath. Taking long deep breaths may help reverse or decrease the chance of developing breathing (pulmonary) problems (especially infection) following: A long period of time when you are unable to move or be active. BEFORE THE PROCEDURE  If the spirometer includes an indicator to show your best effort, your nurse or respiratory therapist will set it to a desired goal. If possible, sit up straight or lean slightly forward. Try not to slouch. Hold the incentive spirometer in an upright position. INSTRUCTIONS FOR USE  Sit on the edge of your bed if possible, or sit up as far as you can in bed or on a chair. Hold the incentive spirometer in an upright position. Breathe out normally. Place the mouthpiece in your  mouth and seal your lips tightly around it. Breathe in slowly and as deeply as possible, raising the piston or  the ball toward the top of the column. Hold your breath for 3-5 seconds or for as long as possible. Allow the piston or ball to fall to the bottom of the column. Remove the mouthpiece from your mouth and breathe out normally. Rest for a few seconds and repeat Steps 1 through 7 at least 10 times every 1-2 hours when you are awake. Take your time and take a few normal breaths between deep breaths. The spirometer may include an indicator to show your best effort. Use the indicator as a goal to work toward during each repetition. After each set of 10 deep breaths, practice coughing to be sure your lungs are clear. If you have an incision (the cut made at the time of surgery), support your incision when coughing by placing a pillow or rolled up towels firmly against it. Once you are able to get out of bed, walk around indoors and cough well. You may stop using the incentive spirometer when instructed by your caregiver.  RISKS AND COMPLICATIONS Take your time so you do not get dizzy or light-headed. If you are in pain, you may need to take or ask for pain medication before doing incentive spirometry. It is harder to take a deep breath if you are having pain. AFTER USE Rest and breathe slowly and easily. It can be helpful to keep track of a log of your progress. Your caregiver can provide you with a simple table to help with this. If you are using the spirometer at home, follow these instructions: SEEK MEDICAL CARE IF:  You are having difficultly using the spirometer. You have trouble using the spirometer as often as instructed. Your pain medication is not giving enough relief while using the spirometer. You develop fever of 100.5 F (38.1 C) or higher. SEEK IMMEDIATE MEDICAL CARE IF:  You cough up bloody sputum that had not been present before. You develop fever of 102 F (38.9 C) or greater. You develop worsening pain at or near the incision site. MAKE SURE YOU:  Understand these  instructions. Will watch your condition. Will get help right away if you are not doing well or get worse. Document Released: 11/09/2006 Document Revised: 09/21/2011 Document Reviewed: 01/10/2007 [facility] Patient Information 2014 [place], [place].   ________________________________________________________________________

## 2024-05-01 ENCOUNTER — Encounter (HOSPITAL_COMMUNITY)
Admission: RE | Admit: 2024-05-01 | Discharge: 2024-05-01 | Disposition: A | Discharge status: Home / Self Care | Source: Ambulatory Visit | Attending: Orthopedic Surgery | Admitting: Orthopedic Surgery

## 2024-05-01 ENCOUNTER — Encounter (HOSPITAL_COMMUNITY): Payer: Self-pay

## 2024-05-01 ENCOUNTER — Other Ambulatory Visit: Payer: Self-pay

## 2024-05-01 VITALS — BP 135/80 | HR 46 | Temp 97.7°F | Ht 72.0 in | Wt 188.0 lb

## 2024-05-01 DIAGNOSIS — J449 Chronic obstructive pulmonary disease, unspecified: Secondary | ICD-10-CM | POA: Diagnosis not present

## 2024-05-01 DIAGNOSIS — I119 Hypertensive heart disease without heart failure: Secondary | ICD-10-CM | POA: Diagnosis not present

## 2024-05-01 DIAGNOSIS — I10 Essential (primary) hypertension: Secondary | ICD-10-CM

## 2024-05-01 DIAGNOSIS — Z01818 Encounter for other preprocedural examination: Secondary | ICD-10-CM | POA: Diagnosis present

## 2024-05-01 DIAGNOSIS — G4733 Obstructive sleep apnea (adult) (pediatric): Secondary | ICD-10-CM | POA: Diagnosis not present

## 2024-05-01 DIAGNOSIS — M1712 Unilateral primary osteoarthritis, left knee: Secondary | ICD-10-CM | POA: Diagnosis not present

## 2024-05-01 DIAGNOSIS — K219 Gastro-esophageal reflux disease without esophagitis: Secondary | ICD-10-CM | POA: Insufficient documentation

## 2024-05-01 DIAGNOSIS — Z87891 Personal history of nicotine dependence: Secondary | ICD-10-CM | POA: Diagnosis not present

## 2024-05-01 HISTORY — DX: Dyspnea, unspecified: R06.00

## 2024-05-01 HISTORY — DX: Anxiety disorder, unspecified: F41.9

## 2024-05-01 HISTORY — DX: Depression, unspecified: F32.A

## 2024-05-01 LAB — CBC
HCT: 46 % (ref 39.0–52.0)
Hemoglobin: 15 g/dL (ref 13.0–17.0)
MCH: 29.6 pg (ref 26.0–34.0)
MCHC: 32.6 g/dL (ref 30.0–36.0)
MCV: 90.9 fL (ref 80.0–100.0)
Platelets: 140 K/uL — ABNORMAL LOW (ref 150–400)
RBC: 5.06 MIL/uL (ref 4.22–5.81)
RDW: 13.9 % (ref 11.5–15.5)
WBC: 5.8 K/uL (ref 4.0–10.5)
nRBC: 0 % (ref 0.0–0.2)

## 2024-05-01 LAB — BASIC METABOLIC PANEL WITH GFR
Anion gap: 8 (ref 5–15)
BUN: 19 mg/dL (ref 8–23)
CO2: 26 mmol/L (ref 22–32)
Calcium: 10.5 mg/dL — ABNORMAL HIGH (ref 8.9–10.3)
Chloride: 106 mmol/L (ref 98–111)
Creatinine, Ser: 0.81 mg/dL (ref 0.61–1.24)
GFR, Estimated: >60 mL/min (ref >60)
Glucose, Bld: 121 mg/dL — ABNORMAL HIGH (ref 70–99)
Potassium: 5.9 mmol/L — ABNORMAL HIGH (ref 3.5–5.1)
Sodium: 140 mmol/L (ref 135–145)

## 2024-05-01 LAB — SURGICAL PCR SCREEN
MRSA, PCR: NEGATIVE
Staphylococcus aureus: NEGATIVE

## 2024-05-01 NOTE — Progress Notes (Addendum)
 For Anesthesia: PCP - Caro Harlene POUR, NP  Cardiologist - Darliss Rogue, MD  Clearance: Tylene Brasil: NP: 02/19/24 Bowel Prep reminder:  Chest x-ray - 04/05/24 EKG - 03/10/24 Stress Test -  ECHO - 05/28/23 Cardiac Cath -  Pacemaker/ICD device last checked: Pacemaker orders received: Device Rep notified:  Spinal Cord Stimulator:N/A  Sleep Study - Yes CPAP - Yes  Fasting Blood Sugar - N/A Checks Blood Sugar _____ times a day Date and result of last Hgb A1c-  Last dose of GLP1 agonist- N/A GLP1 instructions: Hold 7 days prior to schedule (Hold 24 hours-daily)   Last dose of SGLT-2 inhibitors- N/A SGLT-2 instructions: Hold 72 hours prior to surgery  Blood Thinner Instructions:N/A Last Dose: Time last taken:  Aspirin  Instructions: Last Dose: Time last taken:  Activity level: Can go up a flight of stairs and activities of daily living without stopping and without chest pain and/or shortness of breath   Able to exercise without chest pain and/or shortness of breath  Anesthesia review: Hx: COPD,Dysrhythmia,HTN,OSA(CPAP).  Patient denies shortness of breath, fever, cough and chest pain at PAT appointment   Patient verbalized understanding of instructions that were reviewed over the telephone.

## 2024-05-01 NOTE — Progress Notes (Signed)
 Lab. Results: potassium: 5.9

## 2024-05-02 ENCOUNTER — Encounter (HOSPITAL_COMMUNITY): Admission: RE | Admit: 2024-05-02 | Source: Ambulatory Visit

## 2024-05-02 ENCOUNTER — Encounter (HOSPITAL_COMMUNITY): Payer: Self-pay

## 2024-05-02 NOTE — Anesthesia Preprocedure Evaluation (Signed)
 Anesthesia Evaluation  Patient identified by MRN, date of birth, ID band Patient awake    Reviewed: Allergy & Precautions, NPO status , Patient's Chart, lab work & pertinent test results  History of Anesthesia Complications Negative for: history of anesthetic complications  Airway Mallampati: II  TM Distance: >3 FB Neck ROM: Full    Dental no notable dental hx. (+) Partial Lower, Teeth Intact   Pulmonary sleep apnea and Continuous Positive Airway Pressure Ventilation , COPD, former smoker   Pulmonary exam normal breath sounds clear to auscultation       Cardiovascular hypertension, (-) angina (-) Past MI Normal cardiovascular exam+ dysrhythmias  Rhythm:Regular Rate:Normal  05/2023 TTE 1. Left ventricular ejection fraction, by estimation, is 50 to 55%. Left  ventricular ejection fraction by 2D MOD biplane is 50.9 %. The left  ventricle has low normal function. The left ventricle has no regional wall  motion abnormalities. There is mild  left ventricular hypertrophy. Left ventricular diastolic parameters are  consistent with Grade I diastolic dysfunction (impaired relaxation).   2. Right ventricular systolic function is normal. The right ventricular  size is normal.   3. The mitral valve is normal in structure. No evidence of mitral valve  regurgitation.   4. The aortic valve is tricuspid. Aortic valve regurgitation is mild.   5. Aortic dilatation noted. There is mild dilatation of the aortic root,  measuring 40 mm.   6. The inferior vena cava is normal in size with <50% respiratory  variability, suggesting right atrial pressure of 8 mmHg.      Neuro/Psych  PSYCHIATRIC DISORDERS Anxiety        GI/Hepatic Neg liver ROS,GERD  Controlled,,  Endo/Other  negative endocrine ROS    Renal/GU negative Renal ROSLab Results      Component                Value               Date                      NA                       140                  05/01/2024                CL                       106                 05/01/2024                K                        5.9 (H)             05/01/2024                CO2                      26                  05/01/2024                BUN  19                  05/01/2024                CREATININE               0.81                05/01/2024                GFRNONAA                 >60                 05/01/2024                CALCIUM                  10.5 (H)            05/01/2024                ALBUMIN                  4.5                 08/27/2022                GLUCOSE                  121 (H)             05/01/2024                Musculoskeletal  (+) Arthritis ,    Abdominal   Peds  Hematology Lab Results      Component                Value               Date                      WBC                      5.8                 05/01/2024                HGB                      15.0                05/01/2024                HCT                      46.0                05/01/2024                MCV                      90.9                05/01/2024                PLT                      140 (L)  05/01/2024              Anesthesia Other Findings   Reproductive/Obstetrics                              Anesthesia Physical Anesthesia Plan  ASA: 2  Anesthesia Plan: Regional and Spinal   Post-op Pain Management: Regional block*   Induction: Intravenous  PONV Risk Score and Plan: 2 and Treatment may vary due to age or medical condition, Midazolam , Dexamethasone  and Ondansetron   Airway Management Planned: Natural Airway and Nasal Cannula  Additional Equipment: None  Intra-op Plan:   Post-operative Plan: Extubation in OR  Informed Consent: I have reviewed the patients History and Physical, chart, labs and discussed the procedure including the risks, benefits and alternatives for the proposed anesthesia  with the patient or authorized representative who has indicated his/her understanding and acceptance.     Dental advisory given  Plan Discussed with: CRNA and Surgeon  Anesthesia Plan Comments: (See PAT note from 10/20  Spinal w L adductor )         Anesthesia Quick Evaluation

## 2024-05-02 NOTE — Progress Notes (Signed)
 Case: [id number] Date/Time: 05/05/24 0715   Procedure: ARTHROPLASTY, KNEE, TOTAL (Left: Knee)   Anesthesia type: General   Diagnosis: Primary osteoarthritis of left knee [M17.12]   Pre-op diagnosis: Primary osteoarthritis of left knee   Location: [facility] ROOM 06 / [facility]   Surgeons: Kay Kemps, MD       DISCUSSION: Joshua Mercado is an 78 yo male with PMH of former smoking, HTN, COPD, moderate OSA (intolerant to CPAP), GERD, arthritis   Patient underwent R TKA on 12/17/23 under spinal anesthesia. No complications noted.  Seen by Cardiology on 03/10/24 for pre op exam for his L knee. Noted to be stable at that visit. Cleared for surgery:  Preoperative cardiovascular examination the EKG revealed sinus rhythm with occasional PACs with a rate of 62 with no acute ischemic changes noted at this time.  Joshua Mercado perioperative risk of a major cardiac event is 0.4% according to the Revised Cardiac Risk Index (RCRI).  Therefore, he is at low risk for perioperative complications.   His functional capacity is fair at 5.07 METs according to the Duke Activity Status Index (DASI). Recommendations: According to ACC/AHA guidelines, no further cardiovascular testing needed.  The patient may proceed to surgery at acceptable risk.  Seen by PCP on 04/04/24 for routine f/u and for evaluation after a fall and rib pain. CXR obtained was negative. All other issues stable/controlled. Seen again on 10/2 for ongoing rib pain. Symptomatic tx recommended.  AT PAT visit pt reports Can go up a flight of stairs and activities of daily living without stopping and without chest pain and/or shortness of breath     VS: BP 135/80   Pulse (!) 46   Temp 36.5 C (Oral)   Ht 6' (1.829 m)   Wt 85.3 kg   SpO2 96%   BMI 25.50 kg/m   PROVIDERS: Caro Harlene POUR, NP   LABS: Labs reviewed: Repeat BMP DOS for mild hyperkalemia (all labs ordered are listed, but only abnormal results are displayed)  Labs Reviewed  BASIC  METABOLIC PANEL WITH GFR - Abnormal; Notable for the following components:      Result Value   Potassium 5.9 (*)    Glucose, Bld 121 (*)    Calcium 10.5 (*)    All other components within normal limits  CBC - Abnormal; Notable for the following components:   Platelets 140 (*)    All other components within normal limits  SURGICAL PCR SCREEN    CXR 04/04/24:  FINDINGS: Cardiac silhouette is normal in size and configuration. No mediastinal or hilar masses. No evidence of adenopathy.   Lungs are clear.  No pleural effusion or pneumothorax.   Skeletal structures are intact.  No visualized rib fracture.   IMPRESSION: No active cardiopulmonary disease.    EKG 03/10/24:  Sinus rhythm with Premature supraventricular complexes When compared with ECG of 02-Sep-2023 14:13, Premature supraventricular complexes are now Present  Echo 05/28/23:  IMPRESSIONS    1. Left ventricular ejection fraction, by estimation, is 50 to 55%. Left ventricular ejection fraction by 2D MOD biplane is 50.9 %. The left ventricle has low normal function. The left ventricle has no regional wall motion abnormalities. There is mild left ventricular hypertrophy. Left ventricular diastolic parameters are consistent with Grade I diastolic dysfunction (impaired relaxation).  2. Right ventricular systolic function is normal. The right ventricular size is normal.  3. The mitral valve is normal in structure. No evidence of mitral valve regurgitation.  4. The aortic valve is tricuspid.  Aortic valve regurgitation is mild.  5. Aortic dilatation noted. There is mild dilatation of the aortic root, measuring 40 mm.  6. The inferior vena cava is normal in size with <50% respiratory variability, suggesting right atrial pressure of 8 mmHg. Past Medical History:  Diagnosis Date   Anxiety    Arthritis    Knees   COPD (chronic obstructive pulmonary disease) (HCC)    Depression    Does use hearing aid    Bilateral    Dyspnea    Dysrhythmia    Enlarged prostate    GERD (gastroesophageal reflux disease)    Hypercholesterolemia    Hypertension    Sleep apnea     Past Surgical History:  Procedure Laterality Date   CHEEK AUGMENTATION Left 1970   REstructure of cheek bone    COLONOSCOPY  2017   Dr. Nellie   COLONOSCOPY WITH PROPOFOL  N/A 05/05/2022   Procedure: COLONOSCOPY WITH PROPOFOL ;  Surgeon: Jinny Carmine, MD;  Location: [facility];  Service: Endoscopy;  Laterality: N/A;   ESOPHAGOGASTRODUODENOSCOPY (EGD) WITH PROPOFOL  N/A 12/05/2020   Procedure: ESOPHAGOGASTRODUODENOSCOPY (EGD) WITH PROPOFOL ;  Surgeon: Jinny Carmine, MD;  Location: [facility];  Service: Endoscopy;  Laterality: N/A;   EYE SURGERY Bilateral 2005   NASAL SEPTOPLASTY W/ TURBINOPLASTY Bilateral 12/24/2020   Procedure: NASAL SEPTOPLASTY WITH INFERIOR TURBINATE REDUCTION;  Surgeon: Milissa Hamming, MD;  Location: [facility];  Service: ENT;  Laterality: Bilateral;   TOTAL KNEE ARTHROPLASTY Right 12/17/2023   Procedure: ARTHROPLASTY, KNEE, TOTAL;  Surgeon: Kay Kemps, MD;  Location: [facility];  Service: Orthopedics;  Laterality: Right;   UPPER GI ENDOSCOPY      MEDICATIONS:  acetaminophen  (TYLENOL ) 650 MG CR tablet   albuterol  (PROVENTIL ) (2.5 MG/3ML) 0.083% nebulizer solution   Ascorbic Acid  (VITAMIN C  WITH ROSE HIPS PO)   aspirin  EC 81 MG tablet   B Complex-C (SUPER B COMPLEX PO)   Calcium Carb-Cholecalciferol (CALCIUM 600+D3) 600-20 MG-MCG TABS   calcium carbonate (TUMS - DOSED IN MG ELEMENTAL CALCIUM) 500 MG chewable tablet   Cholecalciferol (VITAMIN D3) 125 MCG (5000 UT) CAPS   Cod Liver Oil CAPS   cyanocobalamin  (VITAMIN B12) 500 MCG tablet   cyclobenzaprine  (FLEXERIL ) 10 MG tablet   escitalopram  (LEXAPRO ) 20 MG tablet   finasteride  (PROSCAR ) 5 MG tablet   fluticasone  (FLONASE ) 50 MCG/ACT nasal spray   Garlic 1000 MG CAPS   losartan  (COZAAR ) 100 MG tablet   melatonin 5 MG TABS   Methylsulfonylmethane (MSM) 1000 MG CAPS    metoprolol  succinate (TOPROL -XL) 25 MG 24 hr tablet   Multiple Vitamins-Minerals (MULTIVITAMIN MEN 50+) TABS   ondansetron  (ZOFRAN ) 4 MG tablet   pantoprazole  (PROTONIX ) 40 MG tablet   polyethylene glycol (MIRALAX  / GLYCOLAX ) 17 g packet   Saw Palmetto 450 MG CAPS   selenium  200 MCG TABS tablet   simvastatin  (ZOCOR ) 20 MG tablet   simvastatin  (ZOCOR ) 40 MG tablet   Sod Fluoride-Potassium Nitrate (SODIUM FLUORIDE 5000 ENAMEL DT)   tamsulosin  (FLOMAX ) 0.4 MG CAPS capsule   Tiotropium Bromide  Monohydrate (SPIRIVA  RESPIMAT) 2.5 MCG/ACT AERS   triamcinolone  cream (KENALOG ) 0.1 %   valACYclovir  (VALTREX ) 1000 MG tablet   vitamin E 180 MG (400 UNITS) capsule   No current facility-administered medications for this encounter.   Burnard CHRISTELLA Odis DEVONNA [facility] Short Stay/Anesthesiology [facility] Phone [phone] 05/02/2024 12:09 PM

## 2024-05-05 ENCOUNTER — Inpatient Hospital Stay (HOSPITAL_COMMUNITY)
Admission: RE | Admit: 2024-05-05 | Discharge: 2024-05-09 | DRG: 470 | Disposition: A | Discharge status: Skilled Nursing Facility | Attending: Orthopedic Surgery | Admitting: Orthopedic Surgery

## 2024-05-05 ENCOUNTER — Other Ambulatory Visit: Payer: Self-pay

## 2024-05-05 ENCOUNTER — Encounter (HOSPITAL_COMMUNITY): Payer: Self-pay | Admitting: Orthopedic Surgery

## 2024-05-05 ENCOUNTER — Encounter (HOSPITAL_COMMUNITY)
Admission: RE | Disposition: A | Discharge status: Skilled Nursing Facility | Payer: Self-pay | Source: Home / Self Care | Attending: Orthopedic Surgery

## 2024-05-05 ENCOUNTER — Ambulatory Visit (HOSPITAL_COMMUNITY): Payer: Self-pay | Admitting: Anesthesiology

## 2024-05-05 DIAGNOSIS — M1712 Unilateral primary osteoarthritis, left knee: Secondary | ICD-10-CM

## 2024-05-05 DIAGNOSIS — N400 Enlarged prostate without lower urinary tract symptoms: Secondary | ICD-10-CM | POA: Diagnosis present

## 2024-05-05 DIAGNOSIS — Z96652 Presence of left artificial knee joint: Secondary | ICD-10-CM | POA: Diagnosis not present

## 2024-05-05 DIAGNOSIS — I10 Essential (primary) hypertension: Secondary | ICD-10-CM | POA: Diagnosis present

## 2024-05-05 DIAGNOSIS — K219 Gastro-esophageal reflux disease without esophagitis: Secondary | ICD-10-CM | POA: Diagnosis present

## 2024-05-05 DIAGNOSIS — G8918 Other acute postprocedural pain: Secondary | ICD-10-CM | POA: Diagnosis not present

## 2024-05-05 DIAGNOSIS — J449 Chronic obstructive pulmonary disease, unspecified: Secondary | ICD-10-CM | POA: Diagnosis present

## 2024-05-05 DIAGNOSIS — Z87891 Personal history of nicotine dependence: Secondary | ICD-10-CM

## 2024-05-05 DIAGNOSIS — E7800 Pure hypercholesterolemia, unspecified: Secondary | ICD-10-CM | POA: Diagnosis present

## 2024-05-05 DIAGNOSIS — K5903 Drug induced constipation: Secondary | ICD-10-CM | POA: Diagnosis not present

## 2024-05-05 DIAGNOSIS — F419 Anxiety disorder, unspecified: Secondary | ICD-10-CM | POA: Diagnosis present

## 2024-05-05 DIAGNOSIS — Z79899 Other long term (current) drug therapy: Secondary | ICD-10-CM

## 2024-05-05 DIAGNOSIS — Z91030 Bee allergy status: Secondary | ICD-10-CM

## 2024-05-05 DIAGNOSIS — R5381 Other malaise: Secondary | ICD-10-CM | POA: Diagnosis not present

## 2024-05-05 DIAGNOSIS — F32A Depression, unspecified: Secondary | ICD-10-CM | POA: Diagnosis present

## 2024-05-05 HISTORY — PX: TOTAL KNEE ARTHROPLASTY: SHX125

## 2024-05-05 SURGERY — ARTHROPLASTY, KNEE, TOTAL
Anesthesia: Regional | Site: Knee | Laterality: Left

## 2024-05-05 MED ORDER — PHENYLEPHRINE HCL-NACL 20-0.9 MG/250ML-% IV SOLN
INTRAVENOUS | Status: DC | PRN
  Administered 2024-05-05: 35 ug/min via INTRAVENOUS

## 2024-05-05 MED ORDER — MSM 1000 MG PO CAPS: 1000.0000 mg | ORAL_CAPSULE | Freq: Two times a day (BID) | ORAL | Status: DC

## 2024-05-05 MED ORDER — HYDROMORPHONE HCL 1 MG/ML IJ SOLN
0.5000 mg | INTRAMUSCULAR | Status: DC | PRN
  Administered 2024-05-05 – 2024-05-08 (×5): 0.5 mg via INTRAVENOUS
  Filled 2024-05-05 (×6): qty 0.5

## 2024-05-05 MED ORDER — DOCUSATE SODIUM 100 MG PO CAPS
100.0000 mg | ORAL_CAPSULE | Freq: Two times a day (BID) | ORAL | Status: DC
  Administered 2024-05-05 – 2024-05-09 (×8): 100 mg via ORAL
  Filled 2024-05-05 (×8): qty 1

## 2024-05-05 MED ORDER — BUPIVACAINE LIPOSOME 1.3 % IJ SUSP: 20.0000 mL | Freq: Once | INTRAMUSCULAR | Status: DC

## 2024-05-05 MED ORDER — ESCITALOPRAM OXALATE 10 MG PO TABS
10.0000 mg | ORAL_TABLET | Freq: Every day | ORAL | Status: DC
  Administered 2024-05-05 – 2024-05-09 (×5): 10 mg via ORAL
  Filled 2024-05-05 (×5): qty 1

## 2024-05-05 MED ORDER — TRIAMCINOLONE ACETONIDE 0.1 % EX CREA: 1.0000 | TOPICAL_CREAM | Freq: Two times a day (BID) | CUTANEOUS | Status: DC | PRN

## 2024-05-05 MED ORDER — ACETAMINOPHEN ER 650 MG PO TBCR: 650.0000 mg | EXTENDED_RELEASE_TABLET | Freq: Three times a day (TID) | ORAL | Status: DC | PRN

## 2024-05-05 MED ORDER — SODIUM CHLORIDE (PF) 0.9 % IJ SOLN
INTRAMUSCULAR | Status: AC
  Filled 2024-05-05: qty 30

## 2024-05-05 MED ORDER — FENTANYL CITRATE (PF) 100 MCG/2ML IJ SOLN
INTRAMUSCULAR | Status: AC
  Filled 2024-05-05: qty 2

## 2024-05-05 MED ORDER — OXYCODONE HCL 5 MG PO TABS: 5.0000 mg | ORAL_TABLET | Freq: Three times a day (TID) | ORAL | 0 refills | Status: DC | PRN

## 2024-05-05 MED ORDER — EPHEDRINE 5 MG/ML INJ
INTRAVENOUS | Status: AC
  Filled 2024-05-05: qty 5

## 2024-05-05 MED ORDER — SODIUM CHLORIDE 0.9 % IR SOLN
Status: DC | PRN
  Administered 2024-05-05: 1000 mL

## 2024-05-05 MED ORDER — FENTANYL CITRATE (PF) 50 MCG/ML IJ SOSY
25.0000 ug | PREFILLED_SYRINGE | INTRAMUSCULAR | Status: DC | PRN
  Administered 2024-05-05: 50 ug via INTRAVENOUS

## 2024-05-05 MED ORDER — ORAL CARE MOUTH RINSE: 15.0000 mL | Freq: Once | OROMUCOSAL | Status: AC

## 2024-05-05 MED ORDER — METOCLOPRAMIDE HCL 5 MG/ML IJ SOLN: 5.0000 mg | Freq: Three times a day (TID) | INTRAMUSCULAR | Status: DC | PRN

## 2024-05-05 MED ORDER — ONDANSETRON HCL 4 MG/2ML IJ SOLN: 4.0000 mg | Freq: Four times a day (QID) | INTRAMUSCULAR | Status: DC | PRN

## 2024-05-05 MED ORDER — FENTANYL CITRATE (PF) 100 MCG/2ML IJ SOLN
INTRAMUSCULAR | Status: DC | PRN
  Administered 2024-05-05 (×2): 25 ug via INTRAVENOUS

## 2024-05-05 MED ORDER — VITAMIN B-12 1000 MCG PO TABS
500.0000 ug | ORAL_TABLET | Freq: Every day | ORAL | Status: DC
  Administered 2024-05-06 – 2024-05-09 (×4): 500 ug via ORAL
  Filled 2024-05-05 (×4): qty 1

## 2024-05-05 MED ORDER — ONDANSETRON HCL 4 MG PO TABS: 4.0000 mg | ORAL_TABLET | Freq: Three times a day (TID) | ORAL | 1 refills | Status: AC | PRN

## 2024-05-05 MED ORDER — METHOCARBAMOL 1000 MG/10ML IJ SOLN: 500.0000 mg | Freq: Four times a day (QID) | INTRAMUSCULAR | Status: DC | PRN

## 2024-05-05 MED ORDER — ASPIRIN 81 MG PO CHEW
81.0000 mg | CHEWABLE_TABLET | Freq: Two times a day (BID) | ORAL | Status: DC
  Administered 2024-05-05 – 2024-05-09 (×8): 81 mg via ORAL
  Filled 2024-05-05 (×8): qty 1

## 2024-05-05 MED ORDER — POVIDONE-IODINE 10 % EX SWAB: 2.0000 | Freq: Once | CUTANEOUS | Status: DC

## 2024-05-05 MED ORDER — SOD FLUORIDE-POTASSIUM NITRATE 1.1-5 % DT GEL: Freq: Every day | DENTAL | Status: DC

## 2024-05-05 MED ORDER — CEFAZOLIN SODIUM-DEXTROSE 2-4 GM/100ML-% IV SOLN
2.0000 g | INTRAVENOUS | Status: AC
  Administered 2024-05-05: 2 g via INTRAVENOUS
  Filled 2024-05-05: qty 100

## 2024-05-05 MED ORDER — VITAMIN D 25 MCG (1000 UNIT) PO TABS
5000.0000 [IU] | ORAL_TABLET | Freq: Every day | ORAL | Status: DC
  Administered 2024-05-06 – 2024-05-09 (×4): 5000 [IU] via ORAL
  Filled 2024-05-05 (×4): qty 5

## 2024-05-05 MED ORDER — LACTATED RINGERS IV SOLN: INTRAVENOUS | Status: DC

## 2024-05-05 MED ORDER — SODIUM CHLORIDE (PF) 0.9 % IJ SOLN
INTRAMUSCULAR | Status: DC | PRN
  Administered 2024-05-05: 80 mL

## 2024-05-05 MED ORDER — FLUTICASONE PROPIONATE 50 MCG/ACT NA SUSP
1.0000 | Freq: Every day | NASAL | Status: DC | PRN
Start: 2024-05-05 — End: 2024-05-09

## 2024-05-05 MED ORDER — EPHEDRINE SULFATE (PRESSORS) 25 MG/5ML IV SOSY
PREFILLED_SYRINGE | INTRAVENOUS | Status: DC | PRN
  Administered 2024-05-05: 5 mg via INTRAVENOUS
  Administered 2024-05-05: 2.5 mg via INTRAVENOUS
  Administered 2024-05-05: 5 mg via INTRAVENOUS

## 2024-05-05 MED ORDER — FINASTERIDE 5 MG PO TABS
5.0000 mg | ORAL_TABLET | Freq: Every day | ORAL | Status: DC
  Administered 2024-05-06 – 2024-05-09 (×4): 5 mg via ORAL
  Filled 2024-05-05 (×4): qty 1

## 2024-05-05 MED ORDER — SELENIUM 200 MCG PO TABS: 200.0000 ug | ORAL_TABLET | Freq: Every day | ORAL | Status: DC

## 2024-05-05 MED ORDER — 0.9 % SODIUM CHLORIDE (POUR BTL) OPTIME
TOPICAL | Status: DC | PRN
  Administered 2024-05-05: 1000 mL

## 2024-05-05 MED ORDER — PROPOFOL 1000 MG/100ML IV EMUL
INTRAVENOUS | Status: AC
  Filled 2024-05-05: qty 100

## 2024-05-05 MED ORDER — PANTOPRAZOLE SODIUM 40 MG PO TBEC
40.0000 mg | DELAYED_RELEASE_TABLET | Freq: Every day | ORAL | Status: DC
Start: 2024-05-06 — End: 2024-05-09
  Administered 2024-05-06 – 2024-05-09 (×4): 40 mg via ORAL
  Filled 2024-05-05 (×4): qty 1

## 2024-05-05 MED ORDER — METOPROLOL SUCCINATE ER 25 MG PO TB24
25.0000 mg | ORAL_TABLET | Freq: Every day | ORAL | Status: DC
  Administered 2024-05-05 – 2024-05-08 (×4): 25 mg via ORAL
  Filled 2024-05-05 (×4): qty 1

## 2024-05-05 MED ORDER — OXYCODONE HCL 5 MG PO TABS
5.0000 mg | ORAL_TABLET | ORAL | Status: DC | PRN
  Administered 2024-05-05 (×2): 10 mg via ORAL
  Administered 2024-05-06 (×2): 5 mg via ORAL
  Administered 2024-05-06: 10 mg via ORAL
  Administered 2024-05-07: 5 mg via ORAL
  Administered 2024-05-07 – 2024-05-09 (×8): 10 mg via ORAL
  Filled 2024-05-05: qty 1
  Filled 2024-05-05 (×3): qty 2
  Filled 2024-05-05: qty 1
  Filled 2024-05-05 (×3): qty 2
  Filled 2024-05-05: qty 1
  Filled 2024-05-05 (×2): qty 2
  Filled 2024-05-05: qty 1
  Filled 2024-05-05 (×3): qty 2
  Filled 2024-05-05: qty 1

## 2024-05-05 MED ORDER — TIOTROPIUM BROMIDE 2.5 MCG/ACT IN AERS: 2.0000 | INHALATION_SPRAY | Freq: Every day | RESPIRATORY_TRACT | Status: DC

## 2024-05-05 MED ORDER — SODIUM CHLORIDE 0.9 % IV SOLN: INTRAVENOUS | Status: AC

## 2024-05-05 MED ORDER — MEPIVACAINE HCL (PF) 2 % IJ SOLN
INTRAMUSCULAR | Status: DC | PRN
  Administered 2024-05-05: 68 mg via INTRATHECAL

## 2024-05-05 MED ORDER — SIMVASTATIN 20 MG PO TABS
20.0000 mg | ORAL_TABLET | Freq: Every day | ORAL | Status: DC
  Administered 2024-05-05 – 2024-05-08 (×4): 20 mg via ORAL
  Filled 2024-05-05 (×4): qty 1

## 2024-05-05 MED ORDER — ACETAMINOPHEN 325 MG PO TABS
325.0000 mg | ORAL_TABLET | Freq: Four times a day (QID) | ORAL | Status: DC | PRN
  Administered 2024-05-06 – 2024-05-09 (×4): 650 mg via ORAL
  Filled 2024-05-05 (×4): qty 2

## 2024-05-05 MED ORDER — SIMVASTATIN 40 MG PO TABS: 40.0000 mg | ORAL_TABLET | Freq: Every day | ORAL | Status: DC

## 2024-05-05 MED ORDER — MELATONIN 5 MG PO TABS
5.0000 mg | ORAL_TABLET | Freq: Every day | ORAL | Status: DC
  Administered 2024-05-05 – 2024-05-08 (×4): 5 mg via ORAL
  Filled 2024-05-05 (×4): qty 1

## 2024-05-05 MED ORDER — METHOCARBAMOL 500 MG PO TABS
500.0000 mg | ORAL_TABLET | Freq: Four times a day (QID) | ORAL | Status: DC | PRN
Start: 2024-05-05 — End: 2024-05-09
  Administered 2024-05-05 – 2024-05-09 (×8): 500 mg via ORAL
  Filled 2024-05-05 (×10): qty 1

## 2024-05-05 MED ORDER — PHENOL 1.4 % MT LIQD: 1.0000 | OROMUCOSAL | Status: DC | PRN

## 2024-05-05 MED ORDER — POLYETHYLENE GLYCOL 3350 17 G PO PACK: 17.0000 g | PACK | Freq: Every day | ORAL | Status: DC | PRN

## 2024-05-05 MED ORDER — LOSARTAN POTASSIUM 50 MG PO TABS
100.0000 mg | ORAL_TABLET | Freq: Every day | ORAL | Status: DC
  Administered 2024-05-06 – 2024-05-09 (×4): 100 mg via ORAL
  Filled 2024-05-05 (×4): qty 2

## 2024-05-05 MED ORDER — BUPIVACAINE-EPINEPHRINE (PF) 0.25% -1:200000 IJ SOLN
INTRAMUSCULAR | Status: AC
  Filled 2024-05-05: qty 30

## 2024-05-05 MED ORDER — ACETAMINOPHEN 10 MG/ML IV SOLN
INTRAVENOUS | Status: AC
  Filled 2024-05-05: qty 100

## 2024-05-05 MED ORDER — SAW PALMETTO 450 MG PO CAPS: 450.0000 mg | ORAL_CAPSULE | Freq: Every day | ORAL | Status: DC

## 2024-05-05 MED ORDER — ROPIVACAINE HCL 5 MG/ML IJ SOLN
INTRAMUSCULAR | Status: DC | PRN
  Administered 2024-05-05: 25 mL via PERINEURAL

## 2024-05-05 MED ORDER — METHOCARBAMOL 500 MG PO TABS: 500.0000 mg | ORAL_TABLET | Freq: Three times a day (TID) | ORAL | 1 refills | Status: AC | PRN

## 2024-05-05 MED ORDER — VITAMIN C WITH ROSE HIPS 500 MG PO CAPS: ORAL_CAPSULE | Freq: Every day | ORAL | Status: DC

## 2024-05-05 MED ORDER — BUPIVACAINE LIPOSOME 1.3 % IJ SUSP
INTRAMUSCULAR | Status: AC
  Filled 2024-05-05: qty 20

## 2024-05-05 MED ORDER — PHENYLEPHRINE 80 MCG/ML (10ML) SYRINGE FOR IV PUSH (FOR BLOOD PRESSURE SUPPORT)
PREFILLED_SYRINGE | INTRAVENOUS | Status: DC | PRN
Start: 2024-05-05 — End: 2024-05-05
  Administered 2024-05-05 (×6): 80 ug via INTRAVENOUS

## 2024-05-05 MED ORDER — ONDANSETRON HCL 4 MG PO TABS: 4.0000 mg | ORAL_TABLET | Freq: Three times a day (TID) | ORAL | Status: DC | PRN

## 2024-05-05 MED ORDER — COD LIVER OIL PO CAPS
1.0000 | ORAL_CAPSULE | Freq: Every day | ORAL | Status: DC
Start: 2024-05-05 — End: 2024-05-05

## 2024-05-05 MED ORDER — GLYCOPYRROLATE 0.2 MG/ML IJ SOLN
INTRAMUSCULAR | Status: DC | PRN
  Administered 2024-05-05: .1 mg via INTRAVENOUS

## 2024-05-05 MED ORDER — MENTHOL 3 MG MT LOZG: 1.0000 | LOZENGE | OROMUCOSAL | Status: DC | PRN

## 2024-05-05 MED ORDER — PROPOFOL 500 MG/50ML IV EMUL
INTRAVENOUS | Status: DC | PRN
  Administered 2024-05-05: 30 mg via INTRAVENOUS
  Administered 2024-05-05: 75 ug/kg/min via INTRAVENOUS
  Administered 2024-05-05: 30 ug/kg/min via INTRAVENOUS

## 2024-05-05 MED ORDER — DEXAMETHASONE SOD PHOSPHATE PF 10 MG/ML IJ SOLN
INTRAMUSCULAR | Status: DC | PRN
  Administered 2024-05-05: 5 mg via INTRAVENOUS

## 2024-05-05 MED ORDER — WATER FOR IRRIGATION, STERILE IR SOLN
Status: DC | PRN
  Administered 2024-05-05: 2000 mL

## 2024-05-05 MED ORDER — FENTANYL CITRATE (PF) 50 MCG/ML IJ SOSY
PREFILLED_SYRINGE | INTRAMUSCULAR | Status: AC
  Filled 2024-05-05: qty 1

## 2024-05-05 MED ORDER — CEFAZOLIN SODIUM-DEXTROSE 2-4 GM/100ML-% IV SOLN
2.0000 g | Freq: Four times a day (QID) | INTRAVENOUS | Status: AC
  Administered 2024-05-05 (×2): 2 g via INTRAVENOUS
  Filled 2024-05-05 (×2): qty 100

## 2024-05-05 MED ORDER — ALBUTEROL SULFATE (2.5 MG/3ML) 0.083% IN NEBU: 2.5000 mg | INHALATION_SOLUTION | Freq: Four times a day (QID) | RESPIRATORY_TRACT | Status: DC | PRN

## 2024-05-05 MED ORDER — CLONIDINE HCL (ANALGESIA) 100 MCG/ML EP SOLN
EPIDURAL | Status: DC | PRN
  Administered 2024-05-05: 100 ug

## 2024-05-05 MED ORDER — ACETAMINOPHEN 10 MG/ML IV SOLN: 1000.0000 mg | Freq: Once | INTRAVENOUS | Status: DC | PRN

## 2024-05-05 MED ORDER — PHENYLEPHRINE 80 MCG/ML (10ML) SYRINGE FOR IV PUSH (FOR BLOOD PRESSURE SUPPORT)
PREFILLED_SYRINGE | INTRAVENOUS | Status: AC
  Filled 2024-05-05: qty 10

## 2024-05-05 MED ORDER — ONDANSETRON HCL 4 MG/2ML IJ SOLN: 4.0000 mg | Freq: Once | INTRAMUSCULAR | Status: DC | PRN

## 2024-05-05 MED ORDER — ONDANSETRON HCL 4 MG PO TABS: 4.0000 mg | ORAL_TABLET | Freq: Four times a day (QID) | ORAL | Status: DC | PRN

## 2024-05-05 MED ORDER — POLYETHYLENE GLYCOL 3350 17 G PO PACK
17.0000 g | PACK | Freq: Every day | ORAL | Status: DC | PRN
  Administered 2024-05-08: 17 g via ORAL
  Filled 2024-05-05: qty 1

## 2024-05-05 MED ORDER — VALACYCLOVIR HCL 500 MG PO TABS: 2000.0000 mg | ORAL_TABLET | Freq: Two times a day (BID) | ORAL | Status: DC | PRN

## 2024-05-05 MED ORDER — TAMSULOSIN HCL 0.4 MG PO CAPS
0.4000 mg | ORAL_CAPSULE | Freq: Every day | ORAL | Status: DC
Start: 2024-05-06 — End: 2024-05-09
  Administered 2024-05-06 – 2024-05-09 (×4): 0.4 mg via ORAL
  Filled 2024-05-05 (×4): qty 1

## 2024-05-05 MED ORDER — TRANEXAMIC ACID-NACL 1000-0.7 MG/100ML-% IV SOLN
1000.0000 mg | INTRAVENOUS | Status: AC
  Administered 2024-05-05: 1000 mg via INTRAVENOUS
  Filled 2024-05-05: qty 100

## 2024-05-05 MED ORDER — UMECLIDINIUM BROMIDE 62.5 MCG/ACT IN AEPB
1.0000 | INHALATION_SPRAY | Freq: Every day | RESPIRATORY_TRACT | Status: DC
  Administered 2024-05-06 – 2024-05-09 (×4): 1 via RESPIRATORY_TRACT
  Filled 2024-05-05 (×2): qty 7

## 2024-05-05 MED ORDER — ACETAMINOPHEN 10 MG/ML IV SOLN
INTRAVENOUS | Status: DC | PRN
  Administered 2024-05-05: 1000 mg via INTRAVENOUS

## 2024-05-05 MED ORDER — MEPIVACAINE HCL (PF) 2 % IJ SOLN
INTRAMUSCULAR | Status: AC
  Filled 2024-05-05: qty 20

## 2024-05-05 MED ORDER — CHLORHEXIDINE GLUCONATE 0.12 % MT SOLN
15.0000 mL | Freq: Once | OROMUCOSAL | Status: AC
  Administered 2024-05-05: 15 mL via OROMUCOSAL

## 2024-05-05 MED ORDER — VITAMIN E 45 MG (100 UNIT) PO CAPS
400.0000 [IU] | ORAL_CAPSULE | Freq: Every day | ORAL | Status: DC
  Administered 2024-05-05 – 2024-05-08 (×4): 400 [IU] via ORAL
  Filled 2024-05-05 (×4): qty 4

## 2024-05-05 MED ORDER — CALCIUM CARBONATE ANTACID 500 MG PO CHEW
2.0000 | CHEWABLE_TABLET | ORAL | Status: DC | PRN
Start: 2024-05-05 — End: 2024-05-09

## 2024-05-05 MED ORDER — OYSTER SHELL CALCIUM/D3 500-5 MG-MCG PO TABS
1.0000 | ORAL_TABLET | Freq: Every day | ORAL | Status: DC
  Administered 2024-05-05 – 2024-05-08 (×4): 1 via ORAL
  Filled 2024-05-05 (×4): qty 1

## 2024-05-05 MED ORDER — GARLIC 1000 MG PO CAPS: 1000.0000 mg | ORAL_CAPSULE | Freq: Every day | ORAL | Status: DC

## 2024-05-05 MED ORDER — TRANEXAMIC ACID-NACL 1000-0.7 MG/100ML-% IV SOLN
1000.0000 mg | Freq: Once | INTRAVENOUS | Status: AC
  Administered 2024-05-05: 1000 mg via INTRAVENOUS
  Filled 2024-05-05: qty 100

## 2024-05-05 MED ORDER — ASPIRIN 81 MG PO TBEC: 81.0000 mg | DELAYED_RELEASE_TABLET | Freq: Two times a day (BID) | ORAL | 0 refills | Status: AC

## 2024-05-05 MED ORDER — ADULT MULTIVITAMIN W/MINERALS CH
1.0000 | ORAL_TABLET | Freq: Every day | ORAL | Status: DC
  Administered 2024-05-06 – 2024-05-09 (×4): 1 via ORAL
  Filled 2024-05-05 (×4): qty 1

## 2024-05-05 MED ORDER — METOCLOPRAMIDE HCL 5 MG PO TABS: 5.0000 mg | ORAL_TABLET | Freq: Three times a day (TID) | ORAL | Status: DC | PRN

## 2024-05-05 SURGICAL SUPPLY — 44 items
ATTUNE MED DOME PAT 41 KNEE (Knees) IMPLANT
ATTUNE PS FEM LT SZ 8 CEM KNEE (Femur) IMPLANT
ATTUNE PSRP INSR SZ8 8 KNEE (Insert) IMPLANT
BAG COUNTER SPONGE SURGICOUNT (BAG) IMPLANT
BAG ZIPLOCK 12X15 (MISCELLANEOUS) ×1 IMPLANT
BASE TIBIAL ROT PLAT SZ 8 KNEE (Knees) IMPLANT
BLADE SAG 18X100X1.27 (BLADE) ×1 IMPLANT
BLADE SAW SGTL 13X75X1.27 (BLADE) ×1 IMPLANT
BNDG ELASTIC 6X10 VLCR STRL LF (GAUZE/BANDAGES/DRESSINGS) ×1 IMPLANT
BNDG GAUZE DERMACEA FLUFF 4 (GAUZE/BANDAGES/DRESSINGS) ×1 IMPLANT
BOWL SMART MIX CTS (DISPOSABLE) ×1 IMPLANT
CEMENT HV SMART SET (Cement) ×2 IMPLANT
COVER SURGICAL LIGHT HANDLE (MISCELLANEOUS) ×1 IMPLANT
CUFF TRNQT CYL 34X4.125X (TOURNIQUET CUFF) ×1 IMPLANT
DRAPE SHEET LG 3/4 BI-LAMINATE (DRAPES) ×1 IMPLANT
DRAPE U-SHAPE 47X51 STRL (DRAPES) ×1 IMPLANT
DRSG ADAPTIC 3X8 NADH LF (GAUZE/BANDAGES/DRESSINGS) ×1 IMPLANT
DURAPREP 26ML APPLICATOR (WOUND CARE) ×1 IMPLANT
ELECT PENCIL ROCKER SW 15FT (MISCELLANEOUS) ×1 IMPLANT
ELECT REM PT RETURN 15FT ADLT (MISCELLANEOUS) ×1 IMPLANT
GAUZE PAD ABD 8X10 STRL (GAUZE/BANDAGES/DRESSINGS) ×1 IMPLANT
GAUZE SPONGE 4X4 12PLY STRL (GAUZE/BANDAGES/DRESSINGS) ×1 IMPLANT
GLOVE BIOGEL PI IND STRL 7.5 (GLOVE) ×1 IMPLANT
GLOVE BIOGEL PI IND STRL 8.5 (GLOVE) ×1 IMPLANT
GLOVE ORTHO TXT STRL SZ7.5 (GLOVE) ×1 IMPLANT
GLOVE SURG ORTHO 8.5 STRL (GLOVE) ×1 IMPLANT
GOWN STRL REUS W/ TWL XL LVL3 (GOWN DISPOSABLE) ×2 IMPLANT
IMMOBILIZER KNEE 20 THIGH 36 (SOFTGOODS) ×1 IMPLANT
KIT TURNOVER KIT A (KITS) ×1 IMPLANT
MANIFOLD NEPTUNE II (INSTRUMENTS) ×1 IMPLANT
NS IRRIG 1000ML POUR BTL (IV SOLUTION) ×1 IMPLANT
PACK TOTAL KNEE CUSTOM (KITS) ×1 IMPLANT
PIN STEINMAN FIXATION KNEE (PIN) IMPLANT
PROTECTOR NERVE ULNAR (MISCELLANEOUS) ×1 IMPLANT
SET HNDPC FAN SPRY TIP SCT (DISPOSABLE) ×1 IMPLANT
STRIP CLOSURE SKIN 1/2X4 (GAUZE/BANDAGES/DRESSINGS) ×2 IMPLANT
SUT MNCRL AB 3-0 PS2 18 (SUTURE) ×1 IMPLANT
SUT VIC AB 0 CT1 36 (SUTURE) ×1 IMPLANT
SUT VIC AB 1 CT1 36 (SUTURE) ×2 IMPLANT
SUT VIC AB 2-0 CT1 TAPERPNT 27 (SUTURE) ×1 IMPLANT
TOWEL GREEN STERILE FF (TOWEL DISPOSABLE) ×1 IMPLANT
TRAY CATH INTERMITTENT SS 16FR (CATHETERS) ×1 IMPLANT
WATER STERILE IRR 1000ML POUR (IV SOLUTION) ×2 IMPLANT
YANKAUER SUCT BULB TIP NO VENT (SUCTIONS) ×1 IMPLANT

## 2024-05-05 NOTE — Anesthesia Postprocedure Evaluation (Signed)
 Anesthesia Post Note  Patient: Joshua Mercado  Procedure(s) Performed: ARTHROPLASTY, KNEE, TOTAL (Left: Knee)     Patient location during evaluation: Nursing Unit Anesthesia Type: Regional and Spinal Level of consciousness: oriented and awake and alert Pain management: pain level controlled Vital Signs Assessment: post-procedure vital signs reviewed and stable Respiratory status: spontaneous breathing and respiratory function stable Cardiovascular status: blood pressure returned to baseline and stable Postop Assessment: no headache, no backache, no apparent nausea or vomiting and patient able to bend at knees Anesthetic complications: no   No notable events documented.  Last Vitals:  Vitals:   05/05/24 1212 05/05/24 1238  BP: (!) 116/59   Pulse: 70   Resp: 16   Temp: 36.5 C   SpO2: 97% 95%    Last Pain:  Vitals:   05/05/24 1308  TempSrc:   PainSc: 9                  Garnette DELENA Gab

## 2024-05-05 NOTE — Brief Op Note (Signed)
 05/05/2024  9:33 AM  PATIENT:  Joshua Mercado  78 y.o. male  PRE-OPERATIVE DIAGNOSIS:  Primary osteoarthritis of left knee, end stage  POST-OPERATIVE DIAGNOSIS:  Primary osteoarthritis of left knee, end stage  PROCEDURE:  Procedure(s): ARTHROPLASTY, KNEE, TOTAL (Left) DePuy Attune  SURGEON:  Surgeons and Role:    DEWAINE Kay Kemps, MD - Primary  PHYSICIAN ASSISTANT:   ASSISTANTS: Debby KATHEE Fireman, PA-C   ANESTHESIA:   regional and spinal  EBL:  50 mL   BLOOD ADMINISTERED:none  DRAINS: none   LOCAL MEDICATIONS USED:  MARCAINE      SPECIMEN:  No Specimen  DISPOSITION OF SPECIMEN:  N/A  COUNTS:  YES  TOURNIQUET:  * Missing tourniquet times found for documented tourniquets in log: [id number] *  DICTATION: .Other Dictation: Dictation Number [id number]  PLAN OF CARE: Admit for overnight observation  PATIENT DISPOSITION:  PACU - hemodynamically stable.   Delay start of Pharmacological VTE agent (>24hrs) due to surgical blood loss or risk of bleeding: no

## 2024-05-05 NOTE — Progress Notes (Signed)
 Orthopedic Tech Progress Note Patient Details:  Shawnte Demarest [birth year] [id number] CPM will be removed at 1500 CPM Left Knee CPM Left Knee: On Left Knee Flexion (Degrees): 90 Left Knee Extension (Degrees): 0  Post Interventions Patient Tolerated: Well  Shafter Jupin E Dammon Makarewicz 05/05/2024, 11:20 AM

## 2024-05-05 NOTE — Plan of Care (Signed)
  Problem: Education: Goal: Knowledge of the prescribed therapeutic regimen will improve Outcome: Progressing   Problem: Activity: Goal: Ability to avoid complications of mobility impairment will improve Outcome: Progressing   Problem: Clinical Measurements: Goal: Postoperative complications will be avoided or minimized Outcome: Progressing   Problem: Skin Integrity: Goal: Will show signs of wound healing Outcome: Progressing   

## 2024-05-05 NOTE — Op Note (Signed)
 Joshua Mercado, [place] MEDICAL RECORD NO: [id number] ACCOUNT NO: [id number] DATE OF BIRTH: 1946/05/20 FACILITY: [facility] LOCATION: [facility] PHYSICIAN: Elspeth SAUNDERS. Kay, MD  Operative Report   DATE OF PROCEDURE: 05/05/2024  PREOPERATIVE DIAGNOSIS:  Left knee end-stage osteoarthritis.  POSTOPERATIVE DIAGNOSIS:  Left knee end-stage osteoarthritis.  PROCEDURE PERFORMED:  Left total knee arthroplasty using DePuy Attune prosthesis.  ATTENDING SURGEON:  Elspeth SAUNDERS. Kay, MD  ASSISTANT:  Debby Crock [place], [place], who was scrubbed during the entire procedure, and necessary for satisfactory completion of surgery.  ANESTHESIA:  Spinal anesthesia was used plus adductor canal block.  ESTIMATED BLOOD LOSS:  Minimal.  FLUID REPLACEMENT:  1000 mL crystalloid.  COUNTS:  Instrument count was correct.  COMPLICATIONS:  No complications.  ANTIBIOTICS:  Perioperative antibiotics were given.  TOURNIQUET TIME:  1 hour and 20 minutes at 275 mmHg.  INDICATIONS:  The patient is an 78 year old male who presents with a history of worsening left knee pain due to end-stage osteoarthritis.  The patient has failed an extended period of conservative management and presents for operative left total knee  arthroplasty in order to eliminate pain and to restore function.  Informed consent obtained.  DESCRIPTION OF PROCEDURE:  After an adequate level of spinal anesthesia was achieved plus adductor canal block, the patient was placed supine on the operating room table.  A nonsterile tourniquet was placed on the left proximal thigh.  The left leg was  sterilely prepped and draped in the usual manner.  Timeout called verifying correct patient and correct site.  We elevated the leg and exsanguinated with an Esmarch bandage.  We then inflated the tourniquet to 275 mmHg.  We then placed the knee in  flexion and performed a longitudinal midline incision with a 10 blade scalpel.  We used a fresh 10 blade scalpel to perform a medial  parapatellar arthrotomy.  We then divided the lateral patellofemoral ligaments everting the patella and exposing the  distal femur, which showed advanced tricompartmental OA with full-thickness cartilage loss.  We then entered the distal femur with a step-cut drill.  We irrigated thoroughly.  We then entered the canal with the intramedullary distal femoral guide.  We  went ahead and cut our distal femur 9 mm off the distal femur, set on 5 degrees of valgus.  Next, we sized our femur to a size 8 anterior down performing anterior, posterior, and chamfer cuts with the 4-in-1 block.  We next removed ACL and PCL meniscal  tissues and subluxed the tibia anteriorly.  We then used an external jig and cut 2 mm off the affected medial side, 90 degrees perpendicular to the long axis of the tibia with minimal posterior slope.  Once we had our tibial cut done, we used a laminar  spreader to remove posterior femoral condyle osteophytes.  We then injected the posterior capsule with a combination of Marcaine , Exparel  and saline.  We checked our gaps, which were symmetric at 6 mm.  We then removed our tibial pins and completed our  tibial preparation for the 8 tibia with a modular drill and keel punch placing the center point of the tibial component adjacent to the medial one-third of the tibial tubercle for patellar tracking purposes.  We irrigated thoroughly.  We then cut our box  for 8 left femur.  We drilled our lug holes.  Once we impacted the trial 8 femur in place, 8 left.  With the lug holes drilled, we trialed the knee initially with a size 8, 6 mm  poly.  We then went to the size 8, 8 mm poly, but we were able to achieve  full extension and had excellent flexion and stability.  We then resurfaced our patella cutting initially 27 mm thickness down to 17 mm thickness and drilling lug holes for the 41 patellar button.  We ranged the knee and had excellent patellar tracking  with no touch technique.  We removed all  trial components, pulse irrigated the knee.  We drilled 1 area on the medial tibia that was extremely sclerotic bone with a pin on power.  We dried the bone well, vacuum mixed high viscosity cement on the table,  and then cemented the components into place, tibia, femur, and patella all in 1 step.  We placed the knee in extension with the size 8, 8 mm polyethylene compressing the cement well while the cement set up.  We also used a patellar clamp to hold the  patella compressed while the cement set up.  Once all cement was hard on the table, we removed excess cement with a 1/4-inch curved osteotome.  We inspected the entirety of the knee.  We then went ahead and selected the real size 8, 8 mm polyethylene and  placed on the tibial tray and reduced the knee.  Nice little pop was the medial side reduced.  Full extension.  Excellent patellar tracking.  We injected the anterior capsule with a combination of Marcaine , Exparel  and saline.  We then closed the  parapatellar arthrotomy with #1 Vicryl suture followed by 2-0 and 0 layered subcutaneous closure and running 4-0 Monocryl for skin.  Steri-Strips were applied followed by a sterile dressing.  The patient tolerated surgery well.   PUS D: 05/05/2024 9:39:36 am T: 05/05/2024 9:55:00 am  JOB: [id number]/ [id number]

## 2024-05-05 NOTE — Transfer of Care (Signed)
 Immediate Anesthesia Transfer of Care Note  Patient: Esteven Overfelt  Procedure(s) Performed: ARTHROPLASTY, KNEE, TOTAL (Left: Knee)  Patient Location: PACU  Anesthesia Type:Spinal  Level of Consciousness: awake  Airway & Oxygen Therapy: Patient Spontanous Breathing  Post-op Assessment: Report given to RN and Post -op Vital signs reviewed and stable  Post vital signs: Reviewed and stable  Last Vitals:  Vitals Value Taken Time  BP 90/51 05/05/24 09:45  Temp    Pulse 65 05/05/24 09:48  Resp 18 05/05/24 09:48  SpO2 91 % 05/05/24 09:48  Vitals shown include unfiled device data.  Last Pain:  Vitals:   05/05/24 0610  TempSrc: Oral  PainSc:          Complications: No notable events documented.

## 2024-05-05 NOTE — Care Plan (Signed)
 Ortho Bundle Case Management Note  Patient Details  Name: Joshua Mercado MRN: [id number] Date of Birth: 1945/12/05                  LT TKA on 05/05/24  DCP: Home to [facility]  DME: No needs; has RW  PT: [place]   DME Arranged:  N/A DME Agency:      Additional Comments: Please contact me with any questions of if this plan should need to change.    Burnard Dross, Case Manager [facility] [phone] ext [phone] 05/05/2024, 7:57 AM

## 2024-05-05 NOTE — TOC Initial Note (Signed)
 Transition of Care [facility]) - Initial/Assessment Note    Patient Details  Name: Joshua Mercado MRN: [id number] Date of Birth: [birth year]  [facility]) CM/SW Contact:    NORMAN ASPEN, LCSW Phone Number: 05/05/2024, 2:56 PM  Clinical Narrative:                  Met with pt today to discuss dc planning needs.  Pt reports that he had made arrangements to dc to [facility] when MD clears as he does not have assistance in the home.  Have confirmed with [facility], [place] [phone]), that SNF rehab bed is secured and available when pt cleared for dc.  Ins authorization will need to be started once PT has evaluated and will alert oncoming IP CM weekend coverage.     Expected Discharge Plan: Skilled Nursing Facility Barriers to Discharge: English as a second language teacher, Continued Medical Work up   Patient Goals and CMS Choice Patient states their goals for this hospitalization and ongoing recovery are:: rehab          Expected Discharge Plan and Services In-house Referral: Clinical Social Work   Post Acute Care Choice: Skilled Nursing Facility Living arrangements for the past 2 months: Single Family Home                 DME Arranged: N/A DME Agency: NA                  Prior Living Arrangements/Services Living arrangements for the past 2 months: Single Family Home Lives with:: Self Patient language and need for interpreter reviewed:: Yes Do you feel safe going back to the place where you live?: Yes      Need for Family Participation in Patient Care: No (Comment) Care giver support system in place?: Yes (comment)   Criminal Activity/Legal Involvement Pertinent to Current Situation/Hospitalization: No - Comment as needed  Activities of Daily Living   ADL Screening (condition at time of admission) Independently performs ADLs?: Yes (appropriate for developmental age) Is the patient deaf or have difficulty hearing?: No Does the patient have  difficulty seeing, even when wearing glasses/contacts?: No Does the patient have difficulty concentrating, remembering, or making decisions?: No  Permission Sought/Granted Permission sought to share information with : Facility Medical sales representative, Family Supports Permission granted to share information with : Yes, Verbal Permission Granted  Share Information with NAME: sister, Suellen Zornes- [place] @ [phone]  Permission granted to share info w AGENCY: SNF        Emotional Assessment Appearance:: Appears stated age Attitude/Demeanor/Rapport: Gracious Affect (typically observed): Accepting Orientation: : Oriented to Self, Oriented to Place, Oriented to  Time, Oriented to Situation Alcohol / Substance Use: Not Applicable Psych Involvement: No (comment)  Admission diagnosis:  Primary osteoarthritis of left knee [M17.12] Status post total knee replacement, left [Z96.652] Patient Active Problem List   Diagnosis Date Noted   Status post total knee replacement, left 05/05/2024   Status post total knee replacement, right 12/17/2023   Current severe episode of major depressive disorder without psychotic features without prior episode (HCC) 10/14/2023   Mild aortic regurgitation 07/15/2023   PVC (premature ventricular contraction) 07/15/2023   Sinus arrhythmia 07/15/2023   White coat syndrome without diagnosis of hypertension 07/15/2023   Polyp of descending colon    Arthritis of both knees 06/02/2021   Esophageal dysphagia    Stricture and stenosis of esophagus    Thrombocytopenia 05/19/2020   Benign prostatic hyperplasia with nocturia 05/13/2020  Low testosterone  05/13/2020   Other male erectile dysfunction 05/13/2020   Primary insomnia 05/13/2020   Chronic obstructive pulmonary disease (HCC) 01/19/2020   Mixed hyperlipidemia 01/19/2020   Essential hypertension 01/19/2020   Family hx of prostate cancer 01/19/2020   Osteoarthritis of right knee 07/27/2019   Moderate  obstructive sleep apnea 01/30/2016   Snoring 01/30/2016   PCP:  Caro Harlene POUR, NP Pharmacy:   [facility] [address], [place] - [address] [place] [address] Phone: [phone] Fax: [phone]  [facility] - [address] [place] - [address] [place] [address] Phone: [phone] Fax: [phone]  [facility] [facility] [place], [place] - [address] [place] [address] Phone: [phone] Fax: [phone]     Social Drivers of Health (SDOH) Social History: SDOH Screenings   Food Insecurity: No Food Insecurity (05/05/2024)  Housing: Unknown (05/05/2024)  Transportation Needs: No Transportation Needs (05/05/2024)  Utilities: Not At Risk (05/05/2024)  Depression (PHQ2-9): Low Risk  (04/13/2024)  Financial Resource Strain: Low Risk  (03/08/2024)  Physical Activity: Insufficiently Active (03/08/2024)  Social Connections: Socially Isolated (05/05/2024)  Stress: No Stress Concern Present (03/08/2024)  Tobacco Use: Medium Risk (05/05/2024)  Health Literacy: Unknown (04/21/2022)   Received from [facility]   SDOH Interventions:     Readmission Risk Interventions     No data to display

## 2024-05-05 NOTE — Interval H&P Note (Signed)
 History and Physical Interval Note:  05/05/2024 7:24 AM  Joshua Mercado  has presented today for surgery, with the diagnosis of Primary osteoarthritis of left knee.  The various methods of treatment have been discussed with the patient and family. After consideration of risks, benefits and other options for treatment, the patient has consented to  Procedure(s): ARTHROPLASTY, KNEE, TOTAL (Left) as a surgical intervention.  The patient's history has been reviewed, patient examined, no change in status, stable for surgery.  I have reviewed the patient's chart and labs.  Questions were answered to the patient's satisfaction.     Elspeth JONELLE Her

## 2024-05-05 NOTE — [place] FL2 (Signed)
 [place]  MEDICAID FL2 LEVEL OF CARE FORM     IDENTIFICATION  Patient Name: Joshua Mercado Birthdate: 1945-09-02 Sex: male Admission Date (Current Location): 05/05/2024  [facility] and [place] Number:  Producer, television/film/video and Address:  [facility],  [address], [place] [address]      Provider Number: [id number]  Attending Physician Name and Address:  Kay Kemps, MD  Relative Name and Phone Number:  sister, Esau @ [phone]    Current Level of Care: Hospital Recommended Level of Care: Skilled Nursing Facility Prior Approval Number:    Date Approved/Denied:   PASRR Number: [id number]  Discharge Plan: SNF    Current Diagnoses: Patient Active Problem List   Diagnosis Date Noted   Status post total knee replacement, left 05/05/2024   Status post total knee replacement, right 12/17/2023   Current severe episode of major depressive disorder without psychotic features without prior episode (HCC) 10/14/2023   Mild aortic regurgitation 07/15/2023   PVC (premature ventricular contraction) 07/15/2023   Sinus arrhythmia 07/15/2023   White coat syndrome without diagnosis of hypertension 07/15/2023   Polyp of descending colon    Arthritis of both knees 06/02/2021   Esophageal dysphagia    Stricture and stenosis of esophagus    Thrombocytopenia 05/19/2020   Benign prostatic hyperplasia with nocturia 05/13/2020   Low testosterone  05/13/2020   Other male erectile dysfunction 05/13/2020   Primary insomnia 05/13/2020   Chronic obstructive pulmonary disease (HCC) 01/19/2020   Mixed hyperlipidemia 01/19/2020   Essential hypertension 01/19/2020   Family hx of prostate cancer 01/19/2020   Osteoarthritis of right knee 07/27/2019   Moderate obstructive sleep apnea 01/30/2016   Snoring 01/30/2016    Orientation RESPIRATION BLADDER Height & Weight     Self, Time, Situation, Place  Normal Continent Weight: 200 lb (90.7 kg) Height:  6' (182.9 cm)  BEHAVIORAL  SYMPTOMS/MOOD NEUROLOGICAL BOWEL NUTRITION STATUS      Continent Diet (regular)  AMBULATORY STATUS COMMUNICATION OF NEEDS Skin   Limited Assist Verbally Other (Comment) (surgical incision)                       Personal Care Assistance Level of Assistance  Bathing, Dressing Bathing Assistance: Limited assistance   Dressing Assistance: Limited assistance     Functional Limitations Info  Sight, Hearing, Speech Sight Info: Adequate Hearing Info: Adequate Speech Info: Adequate    SPECIAL CARE FACTORS FREQUENCY  PT (By licensed PT), OT (By licensed OT)     PT Frequency: 5x/wk OT Frequency: 5x/wk            Contractures Contractures Info: Not present    Additional Factors Info  Code Status, Allergies Code Status Info: Full Allergies Info: bee venom           Current Medications (05/05/2024):  This is the current hospital active medication list Current Facility-Administered Medications  Medication Dose Route Frequency Provider Last Rate Last Admin   0.9 %  sodium chloride  infusion   Intravenous Continuous Kay Kemps, MD 40 mL/hr at 05/05/24 1304 Infusion Verify at 05/05/24 1304   acetaminophen  (OFIRMEV ) 10 MG/ML IV            [START ON 05/06/2024] acetaminophen  (TYLENOL ) tablet 325-650 mg  325-650 mg Oral Q6H PRN Kay Kemps, MD       albuterol  (PROVENTIL ) (2.5 MG/3ML) 0.083% nebulizer solution 2.5 mg  2.5 mg Nebulization Q6H PRN Kay Kemps, MD       aspirin  chewable tablet 81  mg  81 mg Oral BID Kay Kemps, MD       calcium carbonate (TUMS - dosed in mg elemental calcium) chewable tablet 400 mg of elemental calcium  2 tablet Oral Q4H PRN Kay Kemps, MD       calcium-vitamin D (OSCAL WITH D) 500-5 MG-MCG per tablet 1 tablet  1 tablet Oral QHS Norris, Steve, MD       ceFAZolin  (ANCEF ) IVPB 2g/100 mL premix  2 g Intravenous Q6H Kay Kemps, MD 200 mL/hr at 05/05/24 1412 2 g at 05/05/24 1412   [START ON 05/06/2024] cholecalciferol (VITAMIN D3) 25 MCG  (1000 UNIT) tablet 5,000 Units  5,000 Units Oral Daily Kay Kemps, MD       NOREEN ON 05/06/2024] cyanocobalamin  (VITAMIN B12) tablet 500 mcg  500 mcg Oral Daily Kay Kemps, MD       docusate sodium  (COLACE) capsule 100 mg  100 mg Oral BID Kay Kemps, MD       escitalopram  (LEXAPRO ) tablet 10 mg  10 mg Oral Daily Kay Kemps, MD       NOREEN ON 05/06/2024] finasteride  (PROSCAR ) tablet 5 mg  5 mg Oral Daily Kay Kemps, MD       fluticasone  (FLONASE ) 50 MCG/ACT nasal spray 1 spray  1 spray Each Nare Daily PRN Kay Kemps, MD       HYDROmorphone  (DILAUDID ) injection 0.5 mg  0.5 mg Intravenous Q4H PRN Kay Kemps, MD   0.5 mg at 05/05/24 1238   [START ON 05/06/2024] losartan  (COZAAR ) tablet 100 mg  100 mg Oral Daily Kay Kemps, MD       melatonin tablet 5 mg  5 mg Oral QHS Norris, Steve, MD       menthol  (CEPACOL) lozenge 3 mg  1 lozenge Oral PRN Kay Kemps, MD       Or   phenol (CHLORASEPTIC) mouth spray 1 spray  1 spray Mouth/Throat PRN Kay Kemps, MD       methocarbamol (ROBAXIN) tablet 500 mg  500 mg Oral Q6H PRN Kay Kemps, MD   500 mg at 05/05/24 1409   Or   methocarbamol (ROBAXIN) injection 500 mg  500 mg Intravenous Q6H PRN Kay Kemps, MD       metoCLOPramide  (REGLAN ) tablet 5-10 mg  5-10 mg Oral Q8H PRN Kay Kemps, MD       Or   metoCLOPramide  (REGLAN ) injection 5-10 mg  5-10 mg Intravenous Q8H PRN Kay Kemps, MD       metoprolol  succinate (TOPROL -XL) 24 hr tablet 25 mg  25 mg Oral QHS Norris, Steve, MD       NOREEN ON 05/06/2024] multivitamin with minerals tablet 1 tablet  1 tablet Oral Daily Kay Kemps, MD       ondansetron  (ZOFRAN ) tablet 4 mg  4 mg Oral Q6H PRN Kay Kemps, MD       Or   ondansetron  (ZOFRAN ) injection 4 mg  4 mg Intravenous Q6H PRN Kay Kemps, MD       ondansetron  (ZOFRAN ) tablet 4 mg  4 mg Oral Q8H PRN Kay Kemps, MD       oxyCODONE  (Oxy IR/ROXICODONE ) immediate release tablet 5-10 mg  5-10 mg Oral Q4H PRN Kay Kemps, MD       NOREEN ON 05/06/2024] pantoprazole  (PROTONIX ) EC tablet 40 mg  40 mg Oral Daily Kay Kemps, MD       polyethylene glycol (MIRALAX  / GLYCOLAX ) packet 17 g  17 g Oral Daily PRN Kay Kemps, MD  simvastatin  (ZOCOR ) tablet 20 mg  20 mg Oral QHS Norris, Steve, MD       NOREEN ON 05/06/2024] tamsulosin  (FLOMAX ) capsule 0.4 mg  0.4 mg Oral Daily Kay Kemps, MD       triamcinolone  cream (KENALOG ) 0.1 % cream 1 Application  1 Application Topical BID PRN Kay Kemps, MD       NOREEN ON 05/06/2024] umeclidinium bromide  (INCRUSE ELLIPTA ) 62.5 MCG/ACT 1 puff  1 puff Inhalation Daily Kay Kemps, MD       valACYclovir  (VALTREX ) tablet 2,000 mg  2,000 mg Oral BID PRN Kay Kemps, MD       vitamin E capsule 400 Units  400 Units Oral QHS Norris, Steve, MD         Discharge Medications: Please see discharge summary for a list of discharge medications.  Relevant Imaging Results:  Relevant Lab Results:   Additional Information    Seleen Walter, LCSW

## 2024-05-05 NOTE — Anesthesia Procedure Notes (Addendum)
 Anesthesia Regional Block: Adductor canal block   Pre-Anesthetic Checklist: , timeout performed,  Correct Patient, Correct Site, Correct Laterality,  Correct Procedure, Correct Position, site marked,  Risks and benefits discussed,  Surgical consent,  Pre-op evaluation,  At surgeon's request and post-op pain management  Laterality: Lower and Left  Prep: chloraprep       Needles:  Injection technique: Single-shot  Needle Type: Echogenic Needle     Needle Length: 9cm  Needle Gauge: 22     Additional Needles:   Procedures:,,,, ultrasound used (permanent image in chart),,    Narrative:  Start time: 05/05/2024 6:53 AM End time: 05/05/2024 6:59 AM Injection made incrementally with aspirations every 5 mL.  Performed by: Personally  Anesthesiologist: Jefm Garnette LABOR, MD  Additional Notes: Block assessed prior to surgery. Pt tolerated procedure well.

## 2024-05-05 NOTE — Anesthesia Procedure Notes (Addendum)
 Spinal  Patient location during procedure: OR Start time: 05/05/2024 7:33 AM End time: 05/05/2024 7:44 AM Reason for block: surgical anesthesia Staffing Performed: anesthesiologist  Anesthesiologist: Jefm Garnette LABOR, MD Performed by: Jefm Garnette LABOR, MD Authorized by: Jefm Garnette LABOR, MD   Preanesthetic Checklist Completed: patient identified, IV checked, risks and benefits discussed, surgical consent, monitors and equipment checked, pre-op evaluation and timeout performed Spinal Block Patient position: sitting Prep: DuraPrep and site prepped and draped Patient monitoring: heart rate, cardiac monitor, continuous pulse ox and blood pressure Approach: right paramedian Location: L4-5 Injection technique: single-shot Needle Needle type: Pencan  Needle gauge: 24 G Needle length: 10 cm Needle insertion depth: 8 cm Assessment Sensory level: T4 Events: CSF return Additional Notes  3 Attempt (s). Pt tolerated procedure well.

## 2024-05-05 NOTE — Evaluation (Signed)
 Physical Therapy Evaluation Patient Details Name: Joshua Mercado MRN: [id number] DOB: August 31, 1945 Today's Date: 05/05/2024  History of Present Illness  Pt s/p L TKR and with hx of COPD, and R TKR (6/25)  Clinical Impression  Pt s/p L TKR and presents with decreased L LE strength/ROM and post op pain limiting functional mobility.  Pt plans dc to [facility] rehab setting to maximize IND and safety prior to return to IND living apt.        If plan is discharge home, recommend the following:     Can travel by private vehicle   No    Equipment Recommendations None recommended by PT  Recommendations for Other Services       Functional Status Assessment Patient has had a recent decline in their functional status and demonstrates the ability to make significant improvements in function in a reasonable and predictable amount of time.     Precautions / Restrictions Precautions Precautions: Fall Restrictions Weight Bearing Restrictions Per Provider Order: Yes LLE Weight Bearing Per Provider Order: Weight bearing as tolerated      Mobility  Bed Mobility Overal bed mobility: Needs Assistance Bed Mobility: Supine to Sit     Supine to sit: Min assist, +2 for physical assistance, +2 for safety/equipment, Used rails     General bed mobility comments: cues for sequence and use of R LE to self assist    Transfers Overall transfer level: Needs assistance Equipment used: Rolling walker (2 wheels) Transfers: Sit to/from Stand Sit to Stand: Min assist, From elevated surface           General transfer comment: cues for LE management and use of UEs to self assist    Ambulation/Gait Ambulation/Gait assistance: Min assist Gait Distance (Feet): 32 Feet Assistive device: Rolling walker (2 wheels) Gait Pattern/deviations: Step-to pattern, Decreased step length - right, Decreased step length - left, Shuffle, Trunk flexed Gait velocity: decr     General Gait Details: cues for sequence,  posture and position from [employer]            Wheelchair Mobility     Tilt Bed    Modified Rankin (Stroke Patients Only)       Balance Overall balance assessment: Needs assistance Sitting-balance support: No upper extremity supported, Feet supported Sitting balance-Leahy Scale: Fair     Standing balance support: Bilateral upper extremity supported Standing balance-Leahy Scale: Poor                               Pertinent Vitals/Pain Pain Assessment Pain Assessment: 0-10 Pain Score: 6  Pain Location: L knee Pain Descriptors / Indicators: Aching, Sore Pain Intervention(s): Limited activity within patient's tolerance, Monitored during session, Premedicated before session, Ice applied    Home Living Family/patient expects to be discharged to:: Skilled nursing facility Living Arrangements: Alone                 Additional Comments: [facility] IND living resident plans dc to [facility] rehab setting    Prior Function Prior Level of Function : Independent/Modified Independent                     Extremity/Trunk Assessment   Upper Extremity Assessment Upper Extremity Assessment: Overall WFL for tasks assessed    Lower Extremity Assessment Lower Extremity Assessment: LLE deficits/detail    Cervical / Trunk Assessment Cervical / Trunk Assessment: Normal  Communication  Communication Communication: No apparent difficulties Factors Affecting Communication: Hearing impaired    Cognition Arousal: Alert Behavior During Therapy: WFL for tasks assessed/performed   PT - Cognitive impairments: No apparent impairments                         Following commands: Intact       Cueing Cueing Techniques: Verbal cues     General Comments      Exercises Total Joint Exercises Ankle Circles/Pumps: AROM, Both, 15 reps, Supine   Assessment/Plan    PT Assessment Patient needs continued PT services  PT Problem List  Decreased strength;Decreased range of motion;Decreased activity tolerance;Decreased balance;Decreased mobility;Decreased knowledge of use of DME;Pain       PT Treatment Interventions DME instruction;Gait training;Stair training;Functional mobility training;Therapeutic activities;Therapeutic exercise;Patient/family education    PT Goals (Current goals can be found in the Care Plan section)  Acute Rehab PT Goals Patient Stated Goal: Regain IND PT Goal Formulation: With patient Time For Goal Achievement: 05/12/24 Potential to Achieve Goals: Good    Frequency 7X/week     Co-evaluation               AM-PAC PT 6 Clicks Mobility  Outcome Measure Help needed turning from your back to your side while in a flat bed without using bedrails?: A Little Help needed moving from lying on your back to sitting on the side of a flat bed without using bedrails?: A Little Help needed moving to and from a bed to a chair (including a wheelchair)?: A Little Help needed standing up from a chair using your arms (e.g., wheelchair or bedside chair)?: A Little Help needed to walk in hospital room?: A Little Help needed climbing 3-5 steps with a railing? : A Lot 6 Click Score: 17    End of Session Equipment Utilized During Treatment: Gait belt;Left knee immobilizer Activity Tolerance: Patient tolerated treatment well Patient left: in chair;with call bell/phone within reach;with chair alarm set;with family/visitor present Nurse Communication: Mobility status PT Visit Diagnosis: Difficulty in walking, not elsewhere classified (R26.2)    Time: 8473-8453 PT Time Calculation (min) (ACUTE ONLY): 20 min   Charges:   PT Evaluation $PT Eval Low Complexity: 1 Low   PT General Charges $$ ACUTE PT VISIT: 1 Visit         [facility] PT Acute Rehabilitation Services Office [phone]   Lorriann Hansmann 05/05/2024, 4:56 PM

## 2024-05-05 NOTE — Discharge Instructions (Signed)
 Ice to the knee constantly.  Keep the incision covered and clean and dry for one week, then may remove the bandage.   Do exercise as instructed every hour, please to prevent stiffness.    DO NOT prop anything under the knee, it will make your knee stiff.  Prop under the ankle to encourage your knee to go straight.   Use the walker while you are up and around for balance.  Wear your support stockings 24/7 to prevent blood clots and take baby aspirin  twice daily for 30 days also to prevent blood clots  Follow up with Dr Kay in two weeks in the office, call [phone] for appt  Please call Dr Kay (cell) [phone] with any questions or concerns.   INSTRUCTIONS AFTER JOINT REPLACEMENT   Remove items at home which could result in a fall. This includes throw rugs or furniture in walking pathways ICE to the affected joint every three hours while awake for 30 minutes at a time, for at least the first 3-5 days, and then as needed for pain and swelling.  Continue to use ice for pain and swelling. You may notice swelling that will progress down to the foot and ankle.  This is normal after surgery.  Elevate your leg when you are not up walking on it.   Continue to use the breathing machine you got in the hospital (incentive spirometer) which will help keep your temperature down.  It is common for your temperature to cycle up and down following surgery, especially at night when you are not up moving around and exerting yourself.  The breathing machine keeps your lungs expanded and your temperature down.   DIET:  As you were doing prior to hospitalization, we recommend a well-balanced diet.  DRESSING / WOUND CARE / SHOWERING  Leave the Aquacel in place for one week. Ok to shower with the bandage on (Waterproof). May remove the bandage in one week and leave off  ACTIVITY  Increase activity slowly as tolerated, but follow the weight bearing instructions below.   No driving for 6 weeks or until  further direction given by your physician.  You cannot drive while taking narcotics.  No lifting or carrying greater than 10 lbs. until further directed by your surgeon. Avoid periods of inactivity such as sitting longer than an hour when not asleep. This helps prevent blood clots.  You may return to work once you are authorized by your doctor.     WEIGHT BEARING   Weight bearing as tolerated with assist device (walker, cane, etc) as directed, use it as long as suggested by your surgeon or therapist, typically at least 4-6 weeks.   EXERCISES  Results after joint replacement surgery are often greatly improved when you follow the exercise, range of motion and muscle strengthening exercises prescribed by your doctor. Safety measures are also important to protect the joint from further injury. Any time any of these exercises cause you to have increased pain or swelling, decrease what you are doing until you are comfortable again and then slowly increase them. If you have problems or questions, call your caregiver or physical therapist for advice.   Rehabilitation is important following a joint replacement. After just a few days of immobilization, the muscles of the leg can become weakened and shrink (atrophy).  These exercises are designed to build up the tone and strength of the thigh and leg muscles and to improve motion. Often times heat used for twenty to thirty  minutes before working out will loosen up your tissues and help with improving the range of motion but do not use heat for the first two weeks following surgery (sometimes heat can increase post-operative swelling).   These exercises can be done on a training (exercise) mat, on the floor, on a table or on a bed. Use whatever works the best and is most comfortable for you.    Use music or television while you are exercising so that the exercises are a pleasant break in your day. This will make your life better with the exercises acting as a  break in your routine that you can look forward to.   Perform all exercises about fifteen times, three times per day or as directed.  You should exercise both the operative leg and the other leg as well.  Exercises include:   Quad Sets - Tighten up the muscle on the front of the thigh (Quad) and hold for 5-10 seconds.   Straight Leg Raises - With your knee straight (if you were given a brace, keep it on), lift the leg to 60 degrees, hold for 3 seconds, and slowly lower the leg.  Perform this exercise against resistance later as your leg gets stronger.  Leg Slides: Lying on your back, slowly slide your foot toward your buttocks, bending your knee up off the floor (only go as far as is comfortable). Then slowly slide your foot back down until your leg is flat on the floor again.  Angel Wings: Lying on your back spread your legs to the side as far apart as you can without causing discomfort.  Hamstring Strength:  Lying on your back, push your heel against the floor with your leg straight by tightening up the muscles of your buttocks.  Repeat, but this time bend your knee to a comfortable angle, and push your heel against the floor.  You may put a pillow under the heel to make it more comfortable if necessary.   A rehabilitation program following joint replacement surgery can speed recovery and prevent re-injury in the future due to weakened muscles. Contact your doctor or a physical therapist for more information on knee rehabilitation.    CONSTIPATION  Constipation is defined medically as fewer than three stools per week and severe constipation as less than one stool per week.  Even if you have a regular bowel pattern at home, your normal regimen is likely to be disrupted due to multiple reasons following surgery.  Combination of anesthesia, postoperative narcotics, change in appetite and fluid intake all can affect your bowels.   YOU MUST use at least one of the following options; they are listed in  order of increasing strength to get the job done.  They are all available over the counter, and you may need to use some, POSSIBLY even all of these options:    Drink plenty of fluids (prune juice may be helpful) and high fiber foods Colace 100 mg by mouth twice a day  Senokot for constipation as directed and as needed Dulcolax (bisacodyl), take with full glass of water   Miralax  (polyethylene glycol) once or twice a day as needed.  If you have tried all these things and are unable to have a bowel movement in the first 3-4 days after surgery call either your surgeon or your primary doctor.    If you experience loose stools or diarrhea, hold the medications until you stool forms back up.  If your symptoms do not get better within  1 week or if they get worse, check with your doctor.  If you experience the worst abdominal pain ever or develop nausea or vomiting, please contact the office immediately for further recommendations for treatment.   ITCHING:  If you experience itching with your medications, try taking only a single pain pill, or even half a pain pill at a time.  You can also use Benadryl over the counter for itching or also to help with sleep.   TED HOSE STOCKINGS:  Use stockings on both legs until for at least 2 weeks or as directed by physician office. They may be removed at night for sleeping.  MEDICATIONS:  See your medication summary on the "After Visit Summary" that nursing will review with you.  You may have some home medications which will be placed on hold until you complete the course of blood thinner medication.  It is important for you to complete the blood thinner medication as prescribed.  PRECAUTIONS:  If you experience chest pain or shortness of breath - call 911 immediately for transfer to the hospital emergency department.   If you develop a fever greater that 101 F, purulent drainage from wound, increased redness or drainage from wound, foul odor from the  wound/dressing, or calf pain - CONTACT YOUR SURGEON.                                                   FOLLOW-UP APPOINTMENTS:  If you do not already have a post-op appointment, please call the office for an appointment to be seen by your surgeon.  Guidelines for how soon to be seen are listed in your "After Visit Summary", but are typically between 1-4 weeks after surgery.  OTHER INSTRUCTIONS:   Knee Replacement:  Do not place pillow under knee, focus on keeping the knee straight while resting. CPM instructions: 0-90 degrees, 2 hours in the morning, 2 hours in the afternoon, and 2 hours in the evening. Place foam block, curve side up under heel at all times except when in CPM or when walking.  DO NOT modify, tear, cut, or change the foam block in any way.  POST-OPERATIVE OPIOID TAPER INSTRUCTIONS: It is important to wean off of your opioid medication as soon as possible. If you do not need pain medication after your surgery it is ok to stop day one. Opioids include: Codeine, Hydrocodone(Norco, Vicodin), Oxycodone (Percocet, oxycontin ) and hydromorphone  amongst others.  Long term and even short term use of opiods can cause: Increased pain response Dependence Constipation Depression Respiratory depression And more.  Withdrawal symptoms can include Flu like symptoms Nausea, vomiting And more Techniques to manage these symptoms Hydrate well Eat regular healthy meals Stay active Use relaxation techniques(deep breathing, meditating, yoga) Do Not substitute Alcohol to help with tapering If you have been on opioids for less than two weeks and do not have pain than it is ok to stop all together.  Plan to wean off of opioids This plan should start within one week post op of your joint replacement. Maintain the same interval or time between taking each dose and first decrease the dose.  Cut the total daily intake of opioids by one tablet each day Next start to increase the time between  doses. The last dose that should be eliminated is the evening dose.   MAKE SURE YOU:  Understand  these instructions.  Get help right away if you are not doing well or get worse.    Thank you for letting us  be a part of your medical care team.  It is a privilege we respect greatly.  We hope these instructions will help you stay on track for a fast and full recovery!

## 2024-05-06 DIAGNOSIS — J449 Chronic obstructive pulmonary disease, unspecified: Secondary | ICD-10-CM | POA: Diagnosis present

## 2024-05-06 DIAGNOSIS — M1712 Unilateral primary osteoarthritis, left knee: Secondary | ICD-10-CM | POA: Diagnosis present

## 2024-05-06 DIAGNOSIS — Z96652 Presence of left artificial knee joint: Secondary | ICD-10-CM | POA: Diagnosis not present

## 2024-05-06 DIAGNOSIS — I10 Essential (primary) hypertension: Secondary | ICD-10-CM | POA: Diagnosis present

## 2024-05-06 DIAGNOSIS — Z87891 Personal history of nicotine dependence: Secondary | ICD-10-CM | POA: Diagnosis not present

## 2024-05-06 DIAGNOSIS — E7800 Pure hypercholesterolemia, unspecified: Secondary | ICD-10-CM | POA: Diagnosis present

## 2024-05-06 DIAGNOSIS — Z79899 Other long term (current) drug therapy: Secondary | ICD-10-CM | POA: Diagnosis not present

## 2024-05-06 DIAGNOSIS — K5903 Drug induced constipation: Secondary | ICD-10-CM | POA: Diagnosis not present

## 2024-05-06 DIAGNOSIS — Z91030 Bee allergy status: Secondary | ICD-10-CM | POA: Diagnosis not present

## 2024-05-06 DIAGNOSIS — R5381 Other malaise: Secondary | ICD-10-CM | POA: Diagnosis not present

## 2024-05-06 DIAGNOSIS — N400 Enlarged prostate without lower urinary tract symptoms: Secondary | ICD-10-CM | POA: Diagnosis present

## 2024-05-06 DIAGNOSIS — F32A Depression, unspecified: Secondary | ICD-10-CM | POA: Diagnosis present

## 2024-05-06 DIAGNOSIS — K219 Gastro-esophageal reflux disease without esophagitis: Secondary | ICD-10-CM | POA: Diagnosis present

## 2024-05-06 DIAGNOSIS — F419 Anxiety disorder, unspecified: Secondary | ICD-10-CM | POA: Diagnosis present

## 2024-05-06 NOTE — Plan of Care (Signed)
  Problem: Pain Management: Goal: Pain level will decrease with appropriate interventions Outcome: Progressing   Problem: Clinical Measurements: Goal: Postoperative complications will be avoided or minimized Outcome: Progressing   Problem: Activity: Goal: Range of joint motion will improve Outcome: Progressing   Problem: Elimination: Goal: Will not experience complications related to urinary retention Outcome: Progressing

## 2024-05-06 NOTE — Care Management Obs Status (Signed)
 MEDICARE OBSERVATION STATUS NOTIFICATION   Patient Details  Name: Joshua Mercado MRN: [id number] Date of Birth: October 04, 1945   Medicare Observation Status Notification Given:  Yes    Joshua CHRISTELLA Eva, LCSW 05/06/2024, 10:29 AM

## 2024-05-06 NOTE — Progress Notes (Signed)
   05/06/24 2230  BiPAP/CPAP/SIPAP  BiPAP/CPAP/SIPAP Pt Type Adult  BiPAP/CPAP/SIPAP Resmed  Mask Type Nasal mask  Dentures removed? Not applicable  FiO2 (%) 21 %  Patient Home Machine No  Patient Home Mask No  Patient Home Tubing No  Auto Titrate Yes  Minimum cmH2O 7.5 cmH2O  Maximum cmH2O 16 cmH2O  Device Plugged into RED Power Outlet Yes

## 2024-05-06 NOTE — Progress Notes (Signed)
 Physical Therapy Treatment Patient Details Name: Joshua Mercado MRN: [id number] DOB: 23-Apr-1946 Today's Date: 05/06/2024   History of Present Illness Pt s/p L TKR and with hx of COPD, and R TKR (6/25)    PT Comments  POD # 1 am session AxO x 3 pleasant, motivated and slightly impulsive.  Memorable from prior TKR.  Pt resides at [facility] and still driving.  Pt plans to D/C to [facility] for ST Rehab prior to returning to his apartment.    Sister at bedside. Nursing reported, Pt did well amb to and from the bathroom earlier this morning.  Pt was back in bed.  Assisted to EOB.  General bed mobility comments: cues for sequence and use of R LE to self assist off bed using strap with Instructions.  Pt reported 8/10 knee pain.  Meds requested. RN gave .5mg  IV Dilaudid  while Pt sat EOB.  Assisted with amb was difficult.  General Gait Details: Decreased amb distance and increased assistance needed this session due to increased c/o dizziness (BP 143/84 and RA 92%) suspect IV Dilaudid  just given.  Very unsteady gait. C/O feeling light.   Assisted back to bed. Applied ICE. Will return later today for another PT session.    If plan is discharge home, recommend the following: A lot of help with walking and/or transfers;A lot of help with bathing/dressing/bathroom   Can travel by private vehicle     No  Equipment Recommendations  None recommended by PT    Recommendations for Other Services       Precautions / Restrictions Precautions Precautions: Fall Precaution/Restrictions Comments: Hx COPD Restrictions Weight Bearing Restrictions Per Provider Order: No LLE Weight Bearing Per Provider Order: Weight bearing as tolerated     Mobility  Bed Mobility Overal bed mobility: Needs Assistance Bed Mobility: Supine to Sit     Supine to sit: Min assist, Mod assist     General bed mobility comments: cues for sequence and use of R LE to self assist off bed using strap with  Instructions.  Pt reported 8/10 knee pain.  Meds requested.    Transfers Overall transfer level: Needs assistance Equipment used: Rolling walker (2 wheels) Transfers: Sit to/from Stand Sit to Stand: Min assist, Mod assist           General transfer comment: Max cues for LE management and use of UEs to self assist    Ambulation/Gait Ambulation/Gait assistance: Mod assist, Max assist Gait Distance (Feet): 16 Feet Assistive device: Rolling walker (2 wheels) Gait Pattern/deviations: Step-to pattern, Decreased step length - right, Decreased step length - left, Shuffle, Trunk flexed Gait velocity: decreased     General Gait Details: Decreased amb distance and increased assistance needed this session due to increased c/o dizziness (BP 143/84 and RA 92%) suspect IV Dilaudid  just given.  Very unsteady gait.  Assisted back to bed.   Stairs             Wheelchair Mobility     Tilt Bed    Modified Rankin (Stroke Patients Only)       Balance                                            Communication Communication Communication: No apparent difficulties Factors Affecting Communication: Hearing impaired  Cognition Arousal: Alert Behavior During Therapy: WFL for tasks assessed/performed  PT - Cognitive impairments: No apparent impairments                       PT - Cognition Comments: AxO x 3 pleasant, motivated and slightly impulsive.  Memorable from prior TKR.  Pt resides at [facility] and still driving.  Pt plans to D/C to [facility] for ST Rehab prior to returning to his apartment.    Sister at bedside. Following commands: Intact      Cueing Cueing Techniques: Verbal cues  Exercises      General Comments        Pertinent Vitals/Pain Pain Assessment Pain Assessment: 0-10 Pain Score: 8  Pain Location: L knee Pain Descriptors / Indicators: Tender, Tightness, Operative site guarding, Grimacing Pain  Intervention(s): Monitored during session, Repositioned, Ice applied    Home Living                          Prior Function            PT Goals (current goals can now be found in the care plan section) Progress towards PT goals: Progressing toward goals    Frequency    7X/week      PT Plan      Co-evaluation              AM-PAC PT 6 Clicks Mobility   Outcome Measure  Help needed turning from your back to your side while in a flat bed without using bedrails?: A Lot Help needed moving from lying on your back to sitting on the side of a flat bed without using bedrails?: A Lot Help needed moving to and from a bed to a chair (including a wheelchair)?: A Lot Help needed standing up from a chair using your arms (e.g., wheelchair or bedside chair)?: A Lot Help needed to walk in hospital room?: A Lot Help needed climbing 3-5 steps with a railing? : Total 6 Click Score: 11    End of Session Equipment Utilized During Treatment: Gait belt   Patient left: in bed;with call bell/phone within reach;with bed alarm set;with family/visitor present Nurse Communication: Mobility status PT Visit Diagnosis: Difficulty in walking, not elsewhere classified (R26.2)     Time: 8895-8870 PT Time Calculation (min) (ACUTE ONLY): 25 min  Charges:    $Gait Training: 8-22 mins $Therapeutic Activity: 8-22 mins PT General Charges $$ ACUTE PT VISIT: 1 Visit                    Katheryn Leap  PTA Acute  Rehabilitation Services Office M-F          [phone]

## 2024-05-06 NOTE — Progress Notes (Signed)
   05/06/24 0028  BiPAP/CPAP/SIPAP  $ Non-Invasive Ventilator  Non-Invasive Vent Initial  $ Face Mask Medium Yes  BiPAP/CPAP/SIPAP Pt Type Adult  BiPAP/CPAP/SIPAP Resmed  Mask Type Nasal mask  Dentures removed? Not applicable  Respiratory Rate 18 breaths/min  FiO2 (%) 21 %  Patient Home Machine No  Patient Home Mask No  Patient Home Tubing No  Auto Titrate Yes  Minimum cmH2O 7.5 cmH2O  Maximum cmH2O 16 cmH2O  Device Plugged into RED Power Outlet Yes  BiPAP/CPAP /SiPAP Vitals  Pulse Rate 63  Resp 18  SpO2 94 %  Bilateral Breath Sounds Clear  MEWS Score/Color  MEWS Score 0  MEWS Score Color Landy

## 2024-05-06 NOTE — Progress Notes (Signed)
 Physical Therapy Treatment Patient Details Name: Joshua Mercado MRN: [id number] DOB: [birth year] Today's Date: 05/06/2024   History of Present Illness Pt s/p L TKR and with hx of COPD, and R TKR (6/25)    PT Comments  POD # 1 pm session Cognition Comments: A little confused earlier today.  Staff reported, Pt was trying to crawl OOB and get to the window.  Pt was moved to a close room to the nurses station.  Pt sleeping, but easily aroused and following all directions but needed  increased time to process.  Slow but improved from am session. Girl Friend came to visit during mid session and got to see him walk in the hallway and do his exercises.   Assisted OOB.  General bed mobility comments: assisted to EOB required Min/Mod Assist for upper body and extra assist to complete scooting all while using strap to guide L LE.  No dizziness reported this session. General transfer comment: Max cues for LE management and proper hand placement to push up.  Also assisted with a toilet transfer.  Required increased assist from lower toilet height.  Unsteady with tight turns and VC's on proper walker use.General Gait Details: Tolerated an increased distance amb from bed to bathroom then from bathroom in hallway.  VC's on proper sequencing, upright posture and proper walker to self distance.  No c/o dizziness this afternoon. Pain level 5/10, reported to RN. Pt was given 5 mg OXY Back to bed required even more assist to support B LE then assist with scooting to [facility].   Performed a few TE's. Positioned to comfort with L LE in full extension.  ICE packs applied.   Pt lives at [facility] at [facility].  Pt will need ST Rehab at SNF prior to returning.    If plan is discharge home, recommend the following: A lot of help with walking and/or transfers;A lot of help with bathing/dressing/bathroom   Can travel by private vehicle     No  Equipment Recommendations  None recommended by PT    Recommendations  for Other Services       Precautions / Restrictions Precautions Precautions: Fall Precaution/Restrictions Comments: Hx COPD Restrictions Weight Bearing Restrictions Per Provider Order: No LLE Weight Bearing Per Provider Order: Weight bearing as tolerated     Mobility  Bed Mobility Overal bed mobility: Needs Assistance Bed Mobility: Supine to Sit, Sit to Supine     Supine to sit: Min assist, Mod assist Sit to supine: Mod assist, Max assist   General bed mobility comments: assisted to EOB required Min/Mod Assist for upper body and extra assist to complete scooting all while using strap to guide L LE.  No dizziness reported this session.  Back to bed required even more assist to support B LE then assist with scooting to [facility].  Positioned to comfort with L LE in full extension.  ICE packs.    Transfers Overall transfer level: Needs assistance Equipment used: Rolling walker (2 wheels) Transfers: Sit to/from Stand Sit to Stand: Min assist, Mod assist           General transfer comment: Max cues for LE management and proper hand placement to push up.  Also assisted with a toilet transfer.  Required increased assist from lower toilet height.  Unsteady with tight turns and VC's on proper walker use.    Ambulation/Gait Ambulation/Gait assistance: Min assist Gait Distance (Feet): 58 Feet Assistive device: Rolling walker (2 wheels) Gait Pattern/deviations: Step-to pattern, Decreased step  length - right, Decreased step length - left, Shuffle, Trunk flexed Gait velocity: decreased     General Gait Details: Tolerated an increased distance amb from bed to bathroom then from bathroom in hallway.  VC's on proper sequencing, upright posture and proper walker to self distance.  No c/o dizziness this afternoon. Pain level 5/10, reported to RN.   Stairs             Wheelchair Mobility     Tilt Bed    Modified Rankin (Stroke Patients Only)       Balance                                             Communication Communication Communication: No apparent difficulties Factors Affecting Communication: Hearing impaired  Cognition Arousal: Alert Behavior During Therapy: WFL for tasks assessed/performed   PT - Cognitive impairments: No apparent impairments                       PT - Cognition Comments: A little confused aerlier today.  Staff reported, Pt was trying to crawl OOB and get to the window.  Pt was moved to a close room to the nurses station.  Pt sleeping, but easily aroused and following all directions with increased time to process.  Slow but improved from am session. Following commands: Intact      Cueing Cueing Techniques: Verbal cues  Exercises  Total Knee Replacement TE's following HEP handout 10 reps B LE ankle pumps 05 reps knee presses 05 reps heel slides  05 reps SAQ's 05 reps SLR's 05 reps ABD Educated on use of gait belt to assist with TE's Followed by ICE     General Comments        Pertinent Vitals/Pain Pain Assessment Pain Assessment: 0-10 Pain Score: 5  Pain Location: L knee with amb Pain Descriptors / Indicators: Tender, Tightness, Operative site guarding, Grimacing Pain Intervention(s): Monitored during session, Repositioned, Patient requesting pain meds-RN notified, Ice applied    Home Living                          Prior Function            PT Goals (current goals can now be found in the care plan section) Progress towards PT goals: Progressing toward goals    Frequency    7X/week      PT Plan      Co-evaluation              AM-PAC PT 6 Clicks Mobility   Outcome Measure  Help needed turning from your back to your side while in a flat bed without using bedrails?: A Lot Help needed moving from lying on your back to sitting on the side of a flat bed without using bedrails?: A Lot Help needed moving to and from a bed to a chair (including a wheelchair)?:  A Lot Help needed standing up from a chair using your arms (e.g., wheelchair or bedside chair)?: A Lot Help needed to walk in hospital room?: A Lot Help needed climbing 3-5 steps with a railing? : Total 6 Click Score: 11    End of Session Equipment Utilized During Treatment: Gait belt Activity Tolerance: Patient tolerated treatment well Patient left: in bed;with call bell/phone within reach;with bed alarm set;with family/visitor  present Nurse Communication: Mobility status PT Visit Diagnosis: Difficulty in walking, not elsewhere classified (R26.2)     Time: 1514-1600 PT Time Calculation (min) (ACUTE ONLY): 46 min  Charges:    $Gait Training: 8-22 mins $Therapeutic Exercise: 8-22 mins $Therapeutic Activity: 8-22 mins PT General Charges $$ ACUTE PT VISIT: 1 Visit                     {Kahner Yanik  PTA Acute  Rehabilitation Services Office M-F          [phone]

## 2024-05-06 NOTE — Progress Notes (Signed)
   Subjective:  Joshua Mercado is a 78 y.o. male, 1 Day Post-Op    s/p Procedure(s): ARTHROPLASTY, KNEE, TOTAL   Patient reports pain as moderate.  Denies numbness or tingling.  Passing gas, voiding.  Denies calf pain.  Seen in rounds for Dr. Kay  Objective:   VITALS:   Vitals:   05/05/24 2042 05/06/24 0028 05/06/24 0045 05/06/24 0552  BP: (!) 160/77  (!) 170/96 (!) 154/71  Pulse: 91 63  93  Resp: 17 18 18 18   Temp: (!) 97.5 F (36.4 C)  (!) 97.5 F (36.4 C) 98.5 F (36.9 C)  TempSrc: Oral  Oral Oral  SpO2: 96% 94% 97% 94%  Weight:      Height:       In the hospital bed no acute distress  Left lower extremity Neurovascular intact Sensation intact distally Intact pulses distally Dorsiflexion/Plantar flexion intact Incision: dressing C/D/I Compartment soft Wound healing well, dressing removed and Aquacel placed with Ace wrap.  Calf soft, nontender.  Lab Results  Component Value Date   WBC 5.8 05/01/2024   HGB 15.0 05/01/2024   HCT 46.0 05/01/2024   MCV 90.9 05/01/2024   PLT 140 (L) 05/01/2024   BMET    Component Value Date/Time   NA 140 05/01/2024 0918   NA 138 08/27/2022 0000   K 5.9 (H) 05/01/2024 0918   CL 106 05/01/2024 0918   CO2 26 05/01/2024 0918   GLUCOSE 121 (H) 05/01/2024 0918   BUN 19 05/01/2024 0918   BUN 20 08/27/2022 0000   CREATININE 0.81 05/01/2024 0918   CREATININE 0.77 10/11/2023 0745   CALCIUM 10.5 (H) 05/01/2024 0918   EGFR 89 08/27/2022 0000   GFRNONAA >60 05/01/2024 0918     @CHLMMEYESTERDAY   Assessment/Plan: 1 Day Post-Op   Principal Problem:   Status post total knee replacement, left   Advance diet Up with therapy  Dressing removed and Aquacel placed..  DC once clears PT likely Sunday or Monday  Weightbearing Status: Weightbearing as tolerated, knee immobilizer with ambulation DVT Prophylaxis: Aspirin     [name] 05/06/2024, 8:53 AM  [name] PA-C  Physician Assistant with Dr. Sharl Gaba  [place]

## 2024-05-07 NOTE — Plan of Care (Signed)
  Problem: Pain Management: Goal: Pain level will decrease with appropriate interventions Outcome: Progressing   Problem: Activity: Goal: Range of joint motion will improve Outcome: Progressing   Problem: Elimination: Goal: Will not experience complications related to urinary retention Outcome: Progressing

## 2024-05-07 NOTE — Progress Notes (Signed)
   05/07/24 2300  BiPAP/CPAP/SIPAP  BiPAP/CPAP/SIPAP Pt Type Adult  BiPAP/CPAP/SIPAP Resmed  Mask Type Nasal mask  Dentures removed? Not applicable  FiO2 (%) 21 %  Patient Home Machine No  Patient Home Mask No  Patient Home Tubing No  Auto Titrate Yes  Minimum cmH2O 7.5 cmH2O  Maximum cmH2O 16 cmH2O  Device Plugged into RED Power Outlet Yes

## 2024-05-07 NOTE — Progress Notes (Signed)
 Physical Therapy Treatment Patient Details Name: Joshua Mercado MRN: [id number] DOB: [birth year] Today's Date: 05/07/2024   History of Present Illness Pt s/p L TKR and with hx of COPD, and R TKR (6/25)    PT Comments  Pt continues cooperative and agreeable to therex but requests ambulation deferred 2* fatigue.  Pt performed therex with assist and L LE positioned to enhance extension and with ice packs in place.    If plan is discharge home, recommend the following: A lot of help with walking and/or transfers;A lot of help with bathing/dressing/bathroom   Can travel by private vehicle     No  Equipment Recommendations  None recommended by PT    Recommendations for Other Services       Precautions / Restrictions Precautions Precautions: Fall Precaution/Restrictions Comments: Hx COPD Restrictions Weight Bearing Restrictions Per Provider Order: Yes LLE Weight Bearing Per Provider Order: Weight bearing as tolerated     Mobility  Bed Mobility Overal bed mobility: Needs Assistance Bed Mobility: Supine to Sit     Supine to sit: Min assist     General bed mobility comments: Increased time with cues for sequence and assist to manage L LE    Transfers Overall transfer level: Needs assistance Equipment used: Rolling walker (2 wheels) Transfers: Sit to/from Stand Sit to Stand: Min assist, From elevated surface           General transfer comment: cues for LE management and use of UEs to self assist    Ambulation/Gait Ambulation/Gait assistance: Min assist Gait Distance (Feet): 48 Feet Assistive device: Rolling walker (2 wheels) Gait Pattern/deviations: Step-to pattern, Decreased step length - right, Decreased step length - left, Shuffle, Trunk flexed Gait velocity: decreased     General Gait Details: cues for posture, position from RW and sequence; distance ltd by fatigue/pain   Stairs             Wheelchair Mobility     Tilt Bed    Modified Rankin (Stroke  Patients Only)       Balance Overall balance assessment: Needs assistance Sitting-balance support: No upper extremity supported, Feet supported Sitting balance-Leahy Scale: Good     Standing balance support: Bilateral upper extremity supported Standing balance-Leahy Scale: Poor                              Communication Communication Communication: No apparent difficulties Factors Affecting Communication: Hearing impaired  Cognition Arousal: Alert Behavior During Therapy: WFL for tasks assessed/performed   PT - Cognitive impairments: No apparent impairments                         Following commands: Intact      Cueing Cueing Techniques: Verbal cues  Exercises Total Joint Exercises Ankle Circles/Pumps: AROM, Both, 15 reps, Supine Quad Sets: AROM, Both, Supine, 15 reps Heel Slides: AAROM, Left, Supine, 20 reps Straight Leg Raises: AAROM, Left, Supine, 20 reps    General Comments        Pertinent Vitals/Pain Pain Assessment Pain Assessment: 0-10 Pain Score: 5  Pain Location: L knee Pain Descriptors / Indicators: Tender, Tightness, Operative site guarding, Grimacing Pain Intervention(s): Limited activity within patient's tolerance, Monitored during session, Premedicated before session, Ice applied    Home Living                          Prior Function  PT Goals (current goals can now be found in the care plan section) Acute Rehab PT Goals Patient Stated Goal: Regain IND PT Goal Formulation: With patient Time For Goal Achievement: 05/12/24 Potential to Achieve Goals: Good Progress towards PT goals: Progressing toward goals    Frequency    7X/week      PT Plan      Co-evaluation              AM-PAC PT 6 Clicks Mobility   Outcome Measure  Help needed turning from your back to your side while in a flat bed without using bedrails?: A Lot Help needed moving from lying on your back to sitting on the  side of a flat bed without using bedrails?: A Lot Help needed moving to and from a bed to a chair (including a wheelchair)?: A Lot Help needed standing up from a chair using your arms (e.g., wheelchair or bedside chair)?: A Little Help needed to walk in hospital room?: A Little Help needed climbing 3-5 steps with a railing? : Total 6 Click Score: 13    End of Session Equipment Utilized During Treatment: Gait belt Activity Tolerance: Patient tolerated treatment well Patient left: in chair;with call bell/phone within reach;with chair alarm set Nurse Communication: Mobility status PT Visit Diagnosis: Difficulty in walking, not elsewhere classified (R26.2)     Time: 8472-8448 PT Time Calculation (min) (ACUTE ONLY): 24 min  Charges:    $Therapeutic Exercise: 8-22 mins PT General Charges $$ ACUTE PT VISIT: 1 Visit                     [facility] PT Acute Rehabilitation Services Office [phone]    [facility] 05/07/2024, 4:34 PM

## 2024-05-07 NOTE — Plan of Care (Signed)
  Problem: Education: Goal: Knowledge of General Education information will improve Description: Including pain rating scale, medication(s)/side effects and non-pharmacologic comfort measures Outcome: Progressing   Problem: Health Behavior/Discharge Planning: Goal: Ability to manage health-related needs will improve Outcome: Progressing   Problem: Clinical Measurements: Goal: Ability to maintain clinical measurements within normal limits will improve Outcome: Progressing Goal: Will remain free from infection Outcome: Progressing Goal: Diagnostic test results will improve Outcome: Progressing Goal: Respiratory complications will improve Outcome: Progressing Goal: Cardiovascular complication will be avoided Outcome: Progressing   Problem: Activity: Goal: Risk for activity intolerance will decrease Outcome: Progressing   Problem: Nutrition: Goal: Adequate nutrition will be maintained Outcome: Progressing   Problem: Coping: Goal: Level of anxiety will decrease Outcome: Progressing   Problem: Elimination: Goal: Will not experience complications related to bowel motility Outcome: Progressing Goal: Will not experience complications related to urinary retention Outcome: Progressing   Problem: Pain Managment: Goal: General experience of comfort will improve and/or be controlled Outcome: Progressing   Problem: Safety: Goal: Ability to remain free from injury will improve Outcome: Progressing   Problem: Skin Integrity: Goal: Risk for impaired skin integrity will decrease Outcome: Progressing   Problem: Education: Goal: Knowledge of the prescribed therapeutic regimen will improve Outcome: Progressing Goal: Individualized Educational Video(s) Outcome: Progressing   Problem: Activity: Goal: Ability to avoid complications of mobility impairment will improve Outcome: Progressing Goal: Range of joint motion will improve Outcome: Progressing   Problem: Clinical  Measurements: Goal: Postoperative complications will be avoided or minimized Outcome: Progressing   Problem: Pain Management: Goal: Pain level will decrease with appropriate interventions Outcome: Progressing   Problem: Skin Integrity: Goal: Will show signs of wound healing Outcome: Progressing   Problem: Education: Goal: Knowledge of the prescribed therapeutic regimen will improve Outcome: Progressing Goal: Individualized Educational Video(s) Outcome: Progressing   Problem: Activity: Goal: Ability to avoid complications of mobility impairment will improve Outcome: Progressing Goal: Range of joint motion will improve Outcome: Progressing   Problem: Clinical Measurements: Goal: Postoperative complications will be avoided or minimized Outcome: Progressing   Problem: Pain Management: Goal: Pain level will decrease with appropriate interventions Outcome: Progressing   Problem: Skin Integrity: Goal: Will show signs of wound healing Outcome: Progressing

## 2024-05-07 NOTE — Progress Notes (Signed)
 Physical Therapy Treatment Patient Details Name: Joshua Mercado MRN: [id number] DOB: 1946/07/11 Today's Date: 05/07/2024   History of Present Illness Pt s/p L TKR and with hx of COPD, and R TKR (6/25)    PT Comments  Pt very cooperative and progressing steadily with mobility.  This am, pt performed therex program with assist, reviewed need for maintaining knee in ext while resting and up to ambulate limited distance in hall.    If plan is discharge home, recommend the following: A lot of help with walking and/or transfers;A lot of help with bathing/dressing/bathroom   Can travel by private vehicle     No  Equipment Recommendations  None recommended by PT    Recommendations for Other Services       Precautions / Restrictions Precautions Precautions: Fall Precaution/Restrictions Comments: Hx COPD Restrictions Weight Bearing Restrictions Per Provider Order: Yes LLE Weight Bearing Per Provider Order: Weight bearing as tolerated     Mobility  Bed Mobility Overal bed mobility: Needs Assistance Bed Mobility: Supine to Sit     Supine to sit: Min assist     General bed mobility comments: Increased time with cues for sequence and assist to manage L LE    Transfers Overall transfer level: Needs assistance Equipment used: Rolling walker (2 wheels) Transfers: Sit to/from Stand Sit to Stand: Min assist, From elevated surface           General transfer comment: cues for LE management and use of UEs to self assist    Ambulation/Gait Ambulation/Gait assistance: Min assist Gait Distance (Feet): 48 Feet Assistive device: Rolling walker (2 wheels) Gait Pattern/deviations: Step-to pattern, Decreased step length - right, Decreased step length - left, Shuffle, Trunk flexed Gait velocity: decreased     General Gait Details: cues for posture, position from RW and sequence; distance ltd by fatigue/pain   Stairs             Wheelchair Mobility     Tilt Bed    Modified  Rankin (Stroke Patients Only)       Balance Overall balance assessment: Needs assistance Sitting-balance support: No upper extremity supported, Feet supported Sitting balance-Leahy Scale: Good     Standing balance support: Bilateral upper extremity supported Standing balance-Leahy Scale: Poor                              Communication Communication Communication: No apparent difficulties Factors Affecting Communication: Hearing impaired  Cognition Arousal: Alert Behavior During Therapy: WFL for tasks assessed/performed   PT - Cognitive impairments: No apparent impairments                       PT - Cognition Comments: A little confused aerlier today.  Staff reported, Pt was trying to crawl OOB and get to the window.  Pt was moved to a close room to the nurses station.  Pt sleeping, but easily aroused and following all directions with increased time to process.  Slow but improved from am session. Following commands: Intact      Cueing Cueing Techniques: Verbal cues  Exercises Total Joint Exercises Ankle Circles/Pumps: AROM, Both, 15 reps, Supine Quad Sets: AROM, Both, 10 reps, Supine Heel Slides: AAROM, Left, 15 reps, Supine Straight Leg Raises: AAROM, Left, 15 reps, Supine    General Comments        Pertinent Vitals/Pain Pain Assessment Pain Assessment: 0-10 Pain Score: 6  Pain Location: L knee with  amb Pain Descriptors / Indicators: Tender, Tightness, Operative site guarding, Grimacing Pain Intervention(s): Limited activity within patient's tolerance, Monitored during session, Premedicated before session, Ice applied    Home Living                          Prior Function            PT Goals (current goals can now be found in the care plan section) Acute Rehab PT Goals Patient Stated Goal: Regain IND PT Goal Formulation: With patient Time For Goal Achievement: 05/12/24 Potential to Achieve Goals: Good Progress towards PT  goals: Progressing toward goals    Frequency    7X/week      PT Plan      Co-evaluation              AM-PAC PT 6 Clicks Mobility   Outcome Measure  Help needed turning from your back to your side while in a flat bed without using bedrails?: A Lot Help needed moving from lying on your back to sitting on the side of a flat bed without using bedrails?: A Lot Help needed moving to and from a bed to a chair (including a wheelchair)?: A Lot Help needed standing up from a chair using your arms (e.g., wheelchair or bedside chair)?: A Little Help needed to walk in hospital room?: A Little Help needed climbing 3-5 steps with a railing? : Total 6 Click Score: 13    End of Session Equipment Utilized During Treatment: Gait belt Activity Tolerance: Patient tolerated treatment well Patient left: in chair;with call bell/phone within reach;with chair alarm set Nurse Communication: Mobility status PT Visit Diagnosis: Difficulty in walking, not elsewhere classified (R26.2)     Time: 9151-9084 PT Time Calculation (min) (ACUTE ONLY): 27 min  Charges:    $Gait Training: 8-22 mins $Therapeutic Exercise: 8-22 mins PT General Charges $$ ACUTE PT VISIT: 1 Visit                     [facility] PT Acute Rehabilitation Services Office [phone]    Janaysia Mcleroy 05/07/2024, 11:03 AM

## 2024-05-08 ENCOUNTER — Encounter (HOSPITAL_COMMUNITY): Payer: Self-pay | Admitting: Orthopedic Surgery

## 2024-05-08 DIAGNOSIS — Z96652 Presence of left artificial knee joint: Secondary | ICD-10-CM

## 2024-05-08 NOTE — Progress Notes (Signed)
 Physical Therapy Treatment Patient Details Name: Joshua Mercado MRN: [id number] DOB: October 09, 1945 Today's Date: 05/08/2024   History of Present Illness Pt s/p L TKR and with hx of COPD, and R TKR (6/25)    PT Comments  POD # 3 am session AxO x 2.5 slightly impaired safety cognition, impulsive with repeat reminders to call for help if needed anything.  Pleasant/cooperative.  Following all commands.  Very HOH but has B hearing Aides. Assisted OOB to amb in hallway and perform TKR TE's required increased time and Instructions.   General bed mobility comments: with increased time and use of rails/HOB elevated, Pt was able to transition to EOB  but needed Min Assist to guide L LE. General transfer comment: 50% VC's on proper tech, proper hand placement to avoid pulling self up using walker as well as VC's for turn completion proper to sit.  HIGH FALL RISK. General Gait Details: improved but slightly unsteady gait with 25% VC's on upright posture, proper walker to self distance plus 50% VC's on turn completion and back steps prior to sitting as Pt tends to sit to soon.  VC's also to extend L LE prior to sit to minimize knee pain with sudden flextion.  Prior, Pt was IND amb and living at [facility] at [facility]  Pt will need ST Rehab at SNF to address mobility and functional decline prior to safely returning home alone.     If plan is discharge home, recommend the following: A lot of help with walking and/or transfers;A lot of help with bathing/dressing/bathroom   Can travel by private vehicle     No  Equipment Recommendations  None recommended by PT    Recommendations for Other Services       Precautions / Restrictions Precautions Precautions: Fall Precaution/Restrictions Comments: Hx COPD, NO pillow under knee Restrictions Weight Bearing Restrictions Per Provider Order: No LLE Weight Bearing Per Provider Order: Weight bearing as tolerated     Mobility  Bed Mobility Overal  bed mobility: Needs Assistance Bed Mobility: Supine to Sit     Supine to sit: Supervision, Contact guard     General bed mobility comments: with increased time and use of rails/HOB elevated, Pt was able to transition to EOB  but needed Min Assist to guide L LE.    Transfers Overall transfer level: Needs assistance Equipment used: Rolling walker (2 wheels) Transfers: Sit to/from Stand Sit to Stand: Min assist, From elevated surface           General transfer comment: 50% VC's on proper tech, proper hand placement to avoid pulling self up using walker as well as VC's for turn completion proper to sit.    Ambulation/Gait Ambulation/Gait assistance: Min assist Gait Distance (Feet): 46 Feet Assistive device: Rolling walker (2 wheels) Gait Pattern/deviations: Step-to pattern, Decreased step length - right, Decreased step length - left, Shuffle, Trunk flexed Gait velocity: decreased     General Gait Details: improved but slightly unsteady gait with 25% VC's on upright posture, proper walker to self distance plus 50% VC's on turn completion and back steps prior to sitting as Pt tends to sit to soon.  VC's also to extend L LE prior to sit to minimize knee pain with sudden flextion.   Stairs             Wheelchair Mobility     Tilt Bed    Modified Rankin (Stroke Patients Only)       Balance  Communication Communication Communication: No apparent difficulties  Cognition Arousal: Alert Behavior During Therapy: WFL for tasks assessed/performed   PT - Cognitive impairments: No apparent impairments                       PT - Cognition Comments: AxO x 2.5 slightly impaired safety cognition, impulsive with repeat reminders to call for help if needed anything.  Pleasant/cooperative.  Following all commands.  Very HOH but has B hearing Aides. Following commands: Intact      Cueing Cueing  Techniques: Verbal cues  Exercises  Total Knee Replacement TE's following HEP handout 10 reps B LE ankle pumps 05 reps towel squeezes 05 reps knee presses 05 reps heel slides  05 reps SAQ's 05 reps SLR's 05 reps ABD Educated on use of gait belt to assist with TE's Followed by ICE     General Comments        Pertinent Vitals/Pain Pain Assessment Pain Assessment: 0-10 Pain Score: 5  Pain Location: L knee Pain Descriptors / Indicators: Tender, Tightness, Operative site guarding, Grimacing Pain Intervention(s): Monitored during session, Premedicated before session, Repositioned, Ice applied    Home Living                          Prior Function            PT Goals (current goals can now be found in the care plan section) Progress towards PT goals: Progressing toward goals    Frequency    7X/week      PT Plan      Co-evaluation              AM-PAC PT 6 Clicks Mobility   Outcome Measure  Help needed turning from your back to your side while in a flat bed without using bedrails?: A Lot Help needed moving from lying on your back to sitting on the side of a flat bed without using bedrails?: A Lot Help needed moving to and from a bed to a chair (including a wheelchair)?: A Lot Help needed standing up from a chair using your arms (e.g., wheelchair or bedside chair)?: A Lot Help needed to walk in hospital room?: A Lot Help needed climbing 3-5 steps with a railing? : Total 6 Click Score: 11    End of Session Equipment Utilized During Treatment: Gait belt Activity Tolerance: Patient limited by fatigue Patient left: in chair;with call bell/phone within reach;with chair alarm set;with family/visitor present Nurse Communication: Mobility status PT Visit Diagnosis: Difficulty in walking, not elsewhere classified (R26.2)     Time: 8769-8745 PT Time Calculation (min) (ACUTE ONLY): 24 min  Charges:    $Gait Training: 8-22 mins $Therapeutic  Exercise: 8-22 mins PT General Charges $$ ACUTE PT VISIT: 1 Visit                     Katheryn Leap  PTA Acute  Rehabilitation Services Office M-F          [phone]

## 2024-05-08 NOTE — Progress Notes (Signed)
 Physical Therapy Treatment Patient Details Name: Joshua Mercado MRN: [id number] DOB: [birth year] Today's Date: 05/08/2024   History of Present Illness Pt s/p L TKR and with hx of COPD, and R TKR (6/25)    PT Comments  POD # 3 pm session PT - Cognition Comments: AxO x 2.5 slightly impaired safety cognition, impulsive with repeat reminders to call for help if needed anything.  Pleasant/cooperative.  Following all commands.  Very HOH but has B hearing Aides. Assisted with amb in the hallway then to the bathroom.  General transfer comment: 50% VC's on proper tech, proper hand placement to avoid pulling self up using walker as well as VC's for turn completion proper to sit.  Also assisted with a toilet transfer. Assisted back to bed and positioned to comfort with SCD's applied as well as ICE. Pt progressing but slowly.  Remains unsteady with gait and transfers.   Prior, Pt was IND amb and living at [facility].  Pt will need ST Rehab at SNF to address mobility and functional decline prior to safely returning home alone.    If plan is discharge home, recommend the following: A lot of help with walking and/or transfers;A lot of help with bathing/dressing/bathroom   Can travel by private vehicle     No  Equipment Recommendations  None recommended by PT    Recommendations for Other Services       Precautions / Restrictions Precautions Precautions: Fall Precaution/Restrictions Comments: Hx COPD, NO pillow under knee Restrictions Weight Bearing Restrictions Per Provider Order: No LLE Weight Bearing Per Provider Order: Weight bearing as tolerated     Mobility  Bed Mobility Overal bed mobility: Needs Assistance Bed Mobility: Sit to Supine     Supine to sit: Supervision, Contact guard Sit to supine: Mod assist, Max assist   General bed mobility comments: increased assist back to bed    Transfers Overall transfer level: Needs assistance Equipment used: Rolling  walker (2 wheels) Transfers: Sit to/from Stand Sit to Stand: Min assist, From elevated surface           General transfer comment: 50% VC's on proper tech, proper hand placement to avoid pulling self up using walker as well as VC's for turn completion proper to sit.  Also assisted with a toilet transfer.    Ambulation/Gait Ambulation/Gait assistance: Min assist, Contact guard assist Gait Distance (Feet): 44 Feet Assistive device: Rolling walker (2 wheels) Gait Pattern/deviations: Step-to pattern, Decreased step length - right, Decreased step length - left, Shuffle, Trunk flexed Gait velocity: decreased     General Gait Details: improved but slightly unsteady gait with 25% VC's on upright posture, proper walker to self distance plus 50% VC's on turn completion and back steps prior to sitting as Pt tends to sit to soon.  VC's also to extend L LE prior to sit to minimize knee pain with sudden flextion.   Stairs             Wheelchair Mobility     Tilt Bed    Modified Rankin (Stroke Patients Only)       Balance                                            Communication Communication Communication: No apparent difficulties Factors Affecting Communication: Hearing impaired  Cognition Arousal: Alert Behavior During Therapy: WFL for tasks  assessed/performed   PT - Cognitive impairments: No apparent impairments                       PT - Cognition Comments: AxO x 2.5 slightly impaired safety cognition, impulsive with repeat reminders to call for help if needed anything.  Pleasant/cooperative.  Following all commands.  Very HOH but has B hearing Aides. Following commands: Intact      Cueing Cueing Techniques: Verbal cues  Exercises      General Comments        Pertinent Vitals/Pain Pain Assessment Pain Assessment: 0-10 Pain Score: 5  Pain Location: L knee Pain Descriptors / Indicators: Tender, Tightness, Operative site guarding,  Grimacing Pain Intervention(s): Monitored during session, Premedicated before session, Repositioned, Ice applied    Home Living                          Prior Function            PT Goals (current goals can now be found in the care plan section) Progress towards PT goals: Progressing toward goals    Frequency    7X/week      PT Plan      Co-evaluation              AM-PAC PT 6 Clicks Mobility   Outcome Measure  Help needed turning from your back to your side while in a flat bed without using bedrails?: A Lot Help needed moving from lying on your back to sitting on the side of a flat bed without using bedrails?: A Lot Help needed moving to and from a bed to a chair (including a wheelchair)?: A Lot Help needed standing up from a chair using your arms (e.g., wheelchair or bedside chair)?: A Lot Help needed to walk in hospital room?: A Lot Help needed climbing 3-5 steps with a railing? : A Lot 6 Click Score: 12    End of Session Equipment Utilized During Treatment: Gait belt Activity Tolerance: Patient limited by fatigue Patient left: in bed;with call bell/phone within reach;with bed alarm set;with family/visitor present Nurse Communication: Mobility status PT Visit Diagnosis: Difficulty in walking, not elsewhere classified (R26.2)     Time: 8488-8463 PT Time Calculation (min) (ACUTE ONLY): 25 min  Charges:    $Gait Training: 8-22 mins $Therapeutic Exercise: 8-22 mins $Therapeutic Activity: 8-22 mins PT General Charges $$ ACUTE PT VISIT: 1 Visit                    Katheryn Leap  PTA Acute  Rehabilitation Services Office M-F          [phone]

## 2024-05-08 NOTE — Progress Notes (Signed)
   Subjective: 3 Days Post-Op Procedure(s) (LRB): ARTHROPLASTY, KNEE, TOTAL (Left)  Pt doing well  Moderate pain in the left knee Ready for d/c today Patient reports pain as moderate.  Objective:   VITALS:   Vitals:   05/07/24 2314 05/08/24 0557  BP: 128/60 139/77  Pulse: 67 91  Resp: 18 16  Temp: 98.5 F (36.9 C) 98.4 F (36.9 C)  SpO2: 94% 90%    Left knee incision healing well Nv intact distally  Gentle rom with some guarding No rashes or edema distally  LABS No results for input(s): HGB, HCT, WBC, PLT in the last 72 hours.  No results for input(s): NA, K, BUN, CREATININE, GLUCOSE in the last 72 hours.   Assessment/Plan: 3 Days Post-Op Procedure(s) (LRB): ARTHROPLASTY, KNEE, TOTAL (Left) Plan to d/c home today after therapy Continue PT/OT F/u in in the office in 2 weeks   Brad Buford Bremer PA-C, [facility] is now [facility] [address], [place], [place] [address] Phone: [phone] [email or web address] Facebook  [employer]

## 2024-05-08 NOTE — TOC Progression Note (Signed)
 Transition of Care [facility]) - Progression Note    Patient Details  Name: Joshua Mercado MRN: [id number] Date of Birth: March 09, 1946  [name] CM/SW Contact  Sheri ONEIDA [place], [place] Phone Number: 05/08/2024, 10:52 AM  Clinical Narrative:    Ins auth started for [facility]; pending approval.    Expected Discharge Plan: Skilled Nursing Facility Barriers to Discharge: English As A Second Language Teacher, Continued Medical Work up               Expected Discharge Plan and Services In-house Referral: Clinical Social Work   Post Acute Care Choice: Skilled Nursing Facility Living arrangements for the past 2 months: Single Family Home Expected Discharge Date: 05/08/24               DME Arranged: N/A DME Agency: NA                   Social Drivers of Health (SDOH) Interventions SDOH Screenings   Food Insecurity: No Food Insecurity (05/05/2024)  Housing: Unknown (05/05/2024)  Transportation Needs: No Transportation Needs (05/05/2024)  Utilities: Not At Risk (05/05/2024)  Depression (PHQ2-9): Low Risk  (04/13/2024)  Financial Resource Strain: Low Risk  (03/08/2024)  Physical Activity: Insufficiently Active (03/08/2024)  Social Connections: Socially Isolated (05/05/2024)  Stress: No Stress Concern Present (03/08/2024)  Tobacco Use: Medium Risk (05/05/2024)  Health Literacy: Unknown (04/21/2022)   Received from [facility]    Readmission Risk Interventions     No data to display

## 2024-05-08 NOTE — Plan of Care (Signed)
  Problem: Health Behavior/Discharge Planning: Goal: Ability to manage health-related needs will improve Outcome: Progressing   Problem: Clinical Measurements: Goal: Will remain free from infection Outcome: Progressing Goal: Diagnostic test results will improve Outcome: Progressing Goal: Respiratory complications will improve Outcome: Progressing Goal: Cardiovascular complication will be avoided Outcome: Progressing   Problem: Nutrition: Goal: Adequate nutrition will be maintained Outcome: Progressing   Problem: Coping: Goal: Level of anxiety will decrease Outcome: Progressing   Problem: Elimination: Goal: Will not experience complications related to urinary retention Outcome: Progressing   Problem: Pain Managment: Goal: General experience of comfort will improve and/or be controlled Outcome: Progressing   Problem: Safety: Goal: Ability to remain free from injury will improve Outcome: Progressing   Problem: Pain Management: Goal: Pain level will decrease with appropriate interventions Outcome: Progressing

## 2024-05-08 NOTE — Progress Notes (Signed)
   05/08/24 2239  BiPAP/CPAP/SIPAP  BiPAP/CPAP/SIPAP Pt Type Adult  Reason BIPAP/CPAP not in use Non-compliant (PT refusing cpap for the night)

## 2024-05-09 ENCOUNTER — Encounter: Payer: Self-pay | Admitting: Internal Medicine

## 2024-05-09 ENCOUNTER — Non-Acute Institutional Stay (SKILLED_NURSING_FACILITY): Payer: Self-pay | Admitting: Internal Medicine

## 2024-05-09 DIAGNOSIS — I10 Essential (primary) hypertension: Secondary | ICD-10-CM

## 2024-05-09 DIAGNOSIS — R5381 Other malaise: Secondary | ICD-10-CM

## 2024-05-09 DIAGNOSIS — K5903 Drug induced constipation: Secondary | ICD-10-CM

## 2024-05-09 DIAGNOSIS — Z96652 Presence of left artificial knee joint: Secondary | ICD-10-CM | POA: Diagnosis not present

## 2024-05-09 DIAGNOSIS — K5900 Constipation, unspecified: Secondary | ICD-10-CM | POA: Insufficient documentation

## 2024-05-09 NOTE — Assessment & Plan Note (Addendum)
 Continue with daily physical therapy.  Patient hopes to get back to his independent living home within the next week.  He thinks that his recovery after his left knee replacement is going better than when he had his right knee replaced in June 2025

## 2024-05-09 NOTE — Progress Notes (Signed)
 [facility] Admission H&P  Provider: Camellia Door, DO Location:  Other [place].  Nursing Home Room Number: [facility] [id number] Place of Service:  SNF (31)   PCP: Caro Harlene POUR, NP Patient Care Team: Caro Harlene POUR, NP as PCP - General (Geriatric Medicine) Darliss Rogue, MD as PCP - Cardiology (Cardiology)   Extended Emergency Contact Information Primary Emergency Contact: Zornes/Cunningham,Suellen  [place] Home Phone: [phone] Mobile Phone: [phone] Relation: Sister Secondary Emergency Contact: Henkes,Neddy  [place] Home Phone: [phone] Mobile Phone: [phone] Relation: Son   Goals of Care: Advanced Directive information    05/05/2024   12:07 PM  Advanced Directives  Type of Advance Directive Healthcare Power of [place];Living will  Does patient want to make changes to medical advance directive? Yes (ED - send information to MyChart)    CODE STATUS: Full Code    Chief Complaint  Patient presents with   New Admit To SNF    Admission.      HPI: Patient is a 78 y.o. male seen today for admission to [facility].  78 year old male history of osteoarthritis of the left knee status post recent left total knee arthroplasty electively done at [facility] this past week.  Admitted to [facility] for acute rehab.  Patient with a history of COPD, BPH, hypertension.  Patient was walking about 44 feet with 2 wheeled walker as of yesterday prior to discharge today.  His pain is well-controlled with just 5 mg of oxycodone .  He thinks that his left total knee arthroplasty is going better than his right total knee replacement that he had done 4 months ago in June 2025.  This is a comprehensive admission note to this SNFperformed on this date less than 30 days from date of admission. Included are preadmission medical/surgical history; reconciled medication list; family history; social history and  comprehensive review of systems.   Corrections and additions to the records were documented. Comprehensive physical exam was also performed. Additionally a clinical summary was entered for each active diagnosis pertinent to this admission in the Problem List to enhance continuity of care.   Past Medical History:  Diagnosis Date   Anxiety    Arthritis    Knees   COPD (chronic obstructive pulmonary disease) (HCC)    Depression    Does use hearing aid    Bilateral   Dyspnea    Dysrhythmia    Enlarged prostate    GERD (gastroesophageal reflux disease)    Hypercholesterolemia    Hypertension    Sleep apnea    Past Surgical History:  Procedure Laterality Date   CHEEK AUGMENTATION Left 1970   REstructure of cheek bone    COLONOSCOPY  2017   Dr. Nellie   COLONOSCOPY WITH PROPOFOL  N/A 05/05/2022   Procedure: COLONOSCOPY WITH PROPOFOL ;  Surgeon: Jinny Carmine, MD;  Location: [facility] ENDOSCOPY;  Service: Endoscopy;  Laterality: N/A;   ESOPHAGOGASTRODUODENOSCOPY (EGD) WITH PROPOFOL  N/A 12/05/2020   Procedure: ESOPHAGOGASTRODUODENOSCOPY (EGD) WITH PROPOFOL ;  Surgeon: Jinny Carmine, MD;  Location: [facility] ENDOSCOPY;  Service: Endoscopy;  Laterality: N/A;   EYE SURGERY Bilateral 2005   NASAL SEPTOPLASTY W/ TURBINOPLASTY Bilateral 12/24/2020   Procedure: NASAL SEPTOPLASTY WITH INFERIOR TURBINATE REDUCTION;  Surgeon: Milissa Hamming, MD;  Location: [facility] ORS;  Service: ENT;  Laterality: Bilateral;   TOTAL KNEE ARTHROPLASTY Right 12/17/2023   Procedure: ARTHROPLASTY, KNEE, TOTAL;  Surgeon: Kay Kemps, MD;  Location: [facility] ORS;  Service: Orthopedics;  Laterality: Right;  TOTAL KNEE ARTHROPLASTY Left 05/05/2024   Procedure: ARTHROPLASTY, KNEE, TOTAL;  Surgeon: Kay Kemps, MD;  Location: [facility] OR[facility];  Service: Orthopedics;  Laterality: Left;   UPPER GI ENDOSCOPY      reports that he quit smoking about 40 years ago. His smoking use included cigarettes. He started smoking about 75 years ago. He has a 35 pack-year  smoking history. He has never used smokeless tobacco. He reports that he does not currently use alcohol. He reports that he does not use drugs. Social History   Socioeconomic History   Marital status: Widowed    Spouse name: Not on file   Number of children: Not on file   Years of education: Not on file   Highest education level: Associate degree: academic program  Occupational History   Not on file  Tobacco Use   Smoking status: Former    Current packs/day: 0.00    Average packs/day: 1 pack/day for 35.0 years (35.0 ttl pk-yrs)    Types: Cigarettes    Start date: 74    Quit date: 52    Years since quitting: 40.8   Smokeless tobacco: Never  Vaping Use   Vaping status: Never Used  Substance and Sexual Activity   Alcohol use: Not Currently   Drug use: Never   Sexual activity: Not on file  Other Topics Concern   Not on file  Social History Narrative   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  Wife is a retired development worker, community.    Social Drivers of Corporate Investment Banker Strain: Low Risk  (03/08/2024)   Overall Financial Resource Strain (CARDIA)    Difficulty of Paying Living Expenses: Not very hard  Food Insecurity: No Food Insecurity (05/05/2024)   Hunger Vital Sign    Worried About Running Out of Food in the Last Year: Never true    Ran Out of Food in the Last Year: Never true  Transportation Needs: No Transportation Needs (05/05/2024)   PRAPARE - Administrator, Civil Service (Medical): No    Lack of Transportation (Non-Medical): No  Physical Activity: Insufficiently Active (03/08/2024)   Exercise Vital Sign    Days of Exercise per Week: 3 days    Minutes of Exercise per Session: 30 min  Stress: No Stress Concern Present (03/08/2024)   [employer] of Occupational Health - Occupational Stress Questionnaire    Feeling of Stress: Not at all  Social Connections: Socially Isolated (05/05/2024)   Social Connection and Isolation Panel    Frequency  of Communication with Friends and Family: Once a week    Frequency of Social Gatherings with Friends and Family: Once a week    Attends Religious Services: Patient declined    Database Administrator or Organizations: Yes    Attends Banker Meetings: 1 to 4 times per year    Marital Status: Widowed  Intimate Partner Violence: Unknown (05/05/2024)   Humiliation, Afraid, Rape, and Kick questionnaire    Fear of Current or Ex-Partner: No    Emotionally Abused: No    Physically Abused: Not on file    Sexually Abused: No     Functional Status Survey:     Family History  Problem Relation Age of Onset   Alzheimer's disease Mother    Diabetes Mother    Heart failure Father    Heart attack Father    Diabetes Father    Dementia Father      Health Maintenance  Topic Date  Due   COVID-19 Vaccine (13 - Moderna risk 2025-26 season) 10/09/2024   Medicare Annual Wellness (AWV)  03/09/2025   Colonoscopy  05/06/2027   DTaP/Tdap/Td (3 - Td or Tdap) 12/20/2032   Pneumococcal Vaccine: 50+ Years  Completed   Influenza Vaccine  Completed   Zoster Vaccines- Shingrix  Completed   Meningococcal B Vaccine  Aged Out   Hepatitis C Screening  Discontinued     Allergies  Allergen Reactions   Bee Pollen Anaphylaxis    Bee Venom   Bee Venom Anaphylaxis and Swelling    Throat, mouth swelling     Outpatient Encounter Medications as of 05/09/2024  Medication Sig   acetaminophen  (TYLENOL ) 325 MG tablet Take 650 mg by mouth every 8 (eight) hours as needed.   albuterol  (VENTOLIN  HFA) 108 (90 Base) MCG/ACT inhaler Inhale 1-2 puffs into the lungs every 6 (six) hours as needed for wheezing or shortness of breath.   Ascorbic Acid  (VITAMIN C  WITH ROSE HIPS PO) Take 1 tablet by mouth at bedtime.   aspirin  EC 81 MG tablet Take 1 tablet (81 mg total) by mouth 2 (two) times daily.   B Complex-C (SUPER B COMPLEX PO) Take 1 tablet by mouth daily.   Calcium Carb-Cholecalciferol (CALCIUM 600+D3) 600-20  MG-MCG TABS Take 1 tablet by mouth at bedtime.   calcium carbonate (TUMS - DOSED IN MG ELEMENTAL CALCIUM) 500 MG chewable tablet Chew 2 tablets by mouth every 4 (four) hours as needed for indigestion or heartburn.   Cholecalciferol (VITAMIN D3) 125 MCG (5000 UT) CAPS Take 1 capsule by mouth daily.   Cod Liver Oil CAPS Take 1 capsule by mouth daily.   cyanocobalamin  (VITAMIN B12) 500 MCG tablet Take 500 mcg by mouth daily.   cyclobenzaprine  (FLEXERIL ) 10 MG tablet TAKE 1 TABLET BY MOUTH TWICE  DAILY AS NEEDED FOR MUSCLE  SPASM(S)   escitalopram  (LEXAPRO ) 20 MG tablet Take 0.5 tablets (10 mg total) by mouth daily. (Patient taking differently: Take 10 mg by mouth at bedtime.)   finasteride  (PROSCAR ) 5 MG tablet TAKE 1 TABLET EVERY DAY   fluticasone  (FLONASE ) 50 MCG/ACT nasal spray USE 1 SPRAY IN BOTH NOSTRILS DAILY   Garlic 1000 MG CAPS Take 1,000 mg by mouth at bedtime.   losartan  (COZAAR ) 100 MG tablet Take 1 tablet (100 mg total) by mouth daily.   melatonin 5 MG TABS Take 5 mg by mouth at bedtime.    metoprolol  succinate (TOPROL -XL) 25 MG 24 hr tablet Take 1 tablet (25 mg total) by mouth daily.   Multiple Vitamins-Minerals (MULTIVITAMIN MEN 50+) TABS Take 1 tablet by mouth daily.   ondansetron  (ZOFRAN ) 4 MG tablet Take 1 tablet (4 mg total) by mouth every 8 (eight) hours as needed.   oxyCODONE  (ROXICODONE ) 5 MG immediate release tablet Take 1 tablet (5 mg total) by mouth every 8 (eight) hours as needed for severe pain (pain score 7-10) or breakthrough pain.   pantoprazole  (PROTONIX ) 40 MG tablet TAKE 1 TABLET EVERY DAY   polyethylene glycol (MIRALAX  / GLYCOLAX ) 17 g packet Take 17 g by mouth daily as needed (constipation).   Saw Palmetto 450 MG CAPS Take 450 mg by mouth daily.   selenium  200 MCG TABS tablet Take 200 mcg by mouth at bedtime.   simvastatin  (ZOCOR ) 20 MG tablet Take 20 mg by mouth at bedtime.   Sod Fluoride-Potassium Nitrate (SODIUM FLUORIDE 5000 ENAMEL DT) Place 1 application   onto teeth at bedtime.   tamsulosin  (FLOMAX ) 0.4 MG CAPS  capsule TAKE 1 CAPSULE EVERY DAY   Tiotropium Bromide  Monohydrate (SPIRIVA  RESPIMAT) 2.5 MCG/ACT AERS Inhale 2 puffs into the lungs daily.   triamcinolone  cream (KENALOG ) 0.1 % APPLY  CREAM EXTERNALLY TO AFFECTED AREA TWICE DAILY   valACYclovir  (VALTREX ) 1000 MG tablet Take 2 tablets (2,000 mg total) by mouth 2 (two) times daily.   vitamin E 180 MG (400 UNITS) capsule Take 400 Units by mouth at bedtime.   acetaminophen  (TYLENOL ) 650 MG CR tablet Take 650 mg by mouth 3 (three) times daily as needed for fever or pain. (Patient not taking: Reported on 05/09/2024)   albuterol  (PROVENTIL ) (2.5 MG/3ML) 0.083% nebulizer solution Take 3 mLs (2.5 mg total) by nebulization every 6 (six) hours as needed for wheezing or shortness of breath. (Patient not taking: Reported on 05/09/2024)   methocarbamol (ROBAXIN) 500 MG tablet Take 1 tablet (500 mg total) by mouth every 8 (eight) hours as needed for muscle spasms. (Patient not taking: Reported on 05/09/2024)   Methylsulfonylmethane (MSM) 1000 MG CAPS Take 1,000 mg by mouth 2 (two) times daily. (Patient not taking: Reported on 05/09/2024)   [DISCONTINUED] ondansetron  (ZOFRAN ) 4 MG tablet Take 4 mg by mouth every 8 (eight) hours as needed for nausea or vomiting.   Facility-Administered Encounter Medications as of 05/09/2024  Medication   acetaminophen  (TYLENOL ) tablet 325-650 mg   albuterol  (PROVENTIL ) (2.5 MG/3ML) 0.083% nebulizer solution 2.5 mg   aspirin  chewable tablet 81 mg   calcium carbonate (TUMS - dosed in mg elemental calcium) chewable tablet 400 mg of elemental calcium   calcium-vitamin D (OSCAL WITH D) 500-5 MG-MCG per tablet 1 tablet   cholecalciferol (VITAMIN D3) 25 MCG (1000 UNIT) tablet 5,000 Units   cyanocobalamin  (VITAMIN B12) tablet 500 mcg   docusate sodium  (COLACE) capsule 100 mg   escitalopram  (LEXAPRO ) tablet 10 mg   finasteride  (PROSCAR ) tablet 5 mg   fluticasone  (FLONASE ) 50  MCG/ACT nasal spray 1 spray   HYDROmorphone  (DILAUDID ) injection 0.5 mg   losartan  (COZAAR ) tablet 100 mg   melatonin tablet 5 mg   menthol  (CEPACOL) lozenge 3 mg   Or   phenol (CHLORASEPTIC) mouth spray 1 spray   methocarbamol (ROBAXIN) tablet 500 mg   Or   methocarbamol (ROBAXIN) injection 500 mg   metoCLOPramide  (REGLAN ) tablet 5-10 mg   Or   metoCLOPramide  (REGLAN ) injection 5-10 mg   metoprolol  succinate (TOPROL -XL) 24 hr tablet 25 mg   multivitamin with minerals tablet 1 tablet   ondansetron  (ZOFRAN ) tablet 4 mg   Or   ondansetron  (ZOFRAN ) injection 4 mg   ondansetron  (ZOFRAN ) tablet 4 mg   oxyCODONE  (Oxy IR/ROXICODONE ) immediate release tablet 5-10 mg   pantoprazole  (PROTONIX ) EC tablet 40 mg   polyethylene glycol (MIRALAX  / GLYCOLAX ) packet 17 g   simvastatin  (ZOCOR ) tablet 20 mg   tamsulosin  (FLOMAX ) capsule 0.4 mg   triamcinolone  cream (KENALOG ) 0.1 % cream 1 Application   umeclidinium bromide  (INCRUSE ELLIPTA ) 62.5 MCG/ACT 1 puff   valACYclovir  (VALTREX ) tablet 2,000 mg   vitamin E capsule 400 Units     Review of Systems  Constitutional: Negative.   HENT: Negative.    Eyes: Negative.   Respiratory: Negative.    Cardiovascular: Negative.   Gastrointestinal: Negative.   Endocrine: Negative.   Genitourinary: Negative.   Musculoskeletal:        Left knee soreness.  He is doing better than when he had his right knee replaced.  Allergic/Immunologic: Negative.   Neurological: Negative.   Hematological: Negative.  Psychiatric/Behavioral: Negative.    All other systems reviewed and are negative.    Vitals:   05/09/24 1152  BP: (!) 111/58  Pulse: 60  Resp: 18  Temp: (!) 97.4 F (36.3 C)  SpO2: 95%  Weight: 190 lb (86.2 kg)  Height: 6' (1.829 m)   Body mass index is 25.77 kg/m. Physical Exam Vitals and nursing note reviewed.  Constitutional:      General: He is not in acute distress.    Appearance: He is normal weight. He is not toxic-appearing.   HENT:     Head: Normocephalic and atraumatic.     Nose: Nose normal.  Cardiovascular:     Rate and Rhythm: Normal rate and regular rhythm.  Pulmonary:     Effort: Pulmonary effort is normal. No respiratory distress.     Breath sounds: Normal breath sounds. No wheezing.  Abdominal:     General: Abdomen is flat. Bowel sounds are normal.     Palpations: Abdomen is soft.  Musculoskeletal:     Comments: Left total knee arthroplasty dressing clean dry and intact.  He is wearing bilateral knee-high compression stockings.  Skin:    General: Skin is warm and dry.     Capillary Refill: Capillary refill takes less than 2 seconds.  Neurological:     General: No focal deficit present.     Mental Status: He is alert and oriented to person, place, and time.      Labs reviewed: Basic Metabolic Panel: Recent Labs    10/11/23 0745 12/13/23 1039 12/23/23 0000 05/01/24 0918  NA 140 137 139 140  K 3.9 4.2 4.1 5.9*  CL 108 108 105 106  CO2 24 22 27* 26  GLUCOSE 92 85  --  121*  BUN 22 15 18 19   CREATININE 0.77 0.85 0.7 0.81  CALCIUM 9.4 9.3 8.9 10.5*   Liver Function Tests: Recent Labs    10/11/23 0745  AST 17  ALT 16  BILITOT 1.1  PROT 6.8    CBC: Recent Labs    10/11/23 0745 12/13/23 1039 12/23/23 0000 05/01/24 0918  WBC 6.2 5.9 5.1 5.8  NEUTROABS 3,174  --  2,825.00  --   HGB 15.6 15.0 11.1* 15.0  HCT 45.7 44.6 33* 46.0  MCV 91.8 95.5  --  90.9  PLT 133* 159 144* 140*   .   Assessment/Plan Assessment & Plan Debility Continue with daily physical therapy.  Patient hopes to get back to his independent living home within the next week.  He thinks that his recovery after his left knee replacement is going better than when he had his right knee replaced in June 2025     Status post total knee replacement, left Patient has follow-up with orthopedics next week.  He still has his dressing intact.  Continue with as needed oxycodone .      Essential  hypertension Stable.  Continue losartan .  100 mg daily, Toprol -XL 25 mg daily.    Drug-induced constipation Patient states that he has not had a bowel movement in a week.  Has been using oxycodone  at the hospital for pain in his knee.  He has MiraLAX  once daily as needed prescribed.  He will take that for 1 or 2 days.  If that does not work, he will reach out and ask for another laxative.        Camellia Door, DO  [facility] [phone]

## 2024-05-09 NOTE — Plan of Care (Signed)
  Problem: Pain Management: Goal: Pain level will decrease with appropriate interventions Outcome: Progressing   Problem: Activity: Goal: Range of joint motion will improve Outcome: Progressing   Problem: Elimination: Goal: Will not experience complications related to urinary retention Outcome: Progressing

## 2024-05-09 NOTE — Assessment & Plan Note (Signed)
 Patient states that he has not had a bowel movement in a week.  Has been using oxycodone  at the hospital for pain in his knee.  He has MiraLAX  once daily as needed prescribed.  He will take that for 1 or 2 days.  If that does not work, he will reach out and ask for another laxative.

## 2024-05-09 NOTE — TOC Transition Note (Signed)
 Transition of Care [facility]) - Discharge Note   Patient Details  Name: Joshua Mercado MRN: [id number] Date of Birth: [birth year]  Transition of [facility]) CM/SW Contact:  NORMAN ASPEN, LCSW Phone Number: 05/09/2024, 10:21 AM   Clinical Narrative:     Have received insurance authorization and SNF bed ready at [facility].  Sister to provide dc transportation.  RN to call report to [phone].  No further IP CM needs.  Final next level of care: Skilled Nursing Facility Barriers to Discharge: Barriers Resolved   Patient Goals and CMS Choice Patient states their goals for this hospitalization and ongoing recovery are:: rehab          Discharge Placement              Patient chooses bed at: [facility] Patient to be transferred to facility by: sister Name of family member notified: sister Patient and family notified of of transfer: 05/09/24  Discharge Plan and Services Additional resources added to the After Visit Summary for   In-house Referral: Clinical Social Work   Post Acute Care Choice: Skilled Nursing Facility          DME Arranged: N/A DME Agency: NA                  Social Drivers of Health (SDOH) Interventions SDOH Screenings   Food Insecurity: No Food Insecurity (05/05/2024)  Housing: Unknown (05/05/2024)  Transportation Needs: No Transportation Needs (05/05/2024)  Utilities: Not At Risk (05/05/2024)  Depression (PHQ2-9): Low Risk  (04/13/2024)  Financial Resource Strain: Low Risk  (03/08/2024)  Physical Activity: Insufficiently Active (03/08/2024)  Social Connections: Socially Isolated (05/05/2024)  Stress: No Stress Concern Present (03/08/2024)  Tobacco Use: Medium Risk (05/05/2024)  Health Literacy: Unknown (04/21/2022)   Received from [facility]     Readmission Risk Interventions     No data to display

## 2024-05-09 NOTE — Assessment & Plan Note (Addendum)
 Patient has follow-up with orthopedics next week.  He still has his dressing intact.  Continue with as needed oxycodone .

## 2024-05-09 NOTE — Assessment & Plan Note (Signed)
 Stable.  Continue losartan .  100 mg daily, Toprol -XL 25 mg daily.

## 2024-05-09 NOTE — Discharge Summary (Signed)
 In most cases prophylactic antibiotics for Dental procdeures after total joint surgery are not necessary.  Exceptions are as follows:  1. History of prior total joint infection  2. Severely immunocompromised (Organ Transplant, cancer chemotherapy, Rheumatoid biologic meds such as Humera)  3. Poorly controlled diabetes (A1C &gt; 8.0, blood glucose over 200)  If you have one of these conditions, contact your surgeon for an antibiotic prescription, prior to your dental procedure. Orthopedic Discharge Summary        Physician Discharge Summary  Patient ID: Joshua Mercado MRN: [id number] DOB/AGE: January 13, 1946 78 y.o.  Admit date: 05/05/2024 Discharge date: 05/09/2024   Procedures:  Procedure(s) (LRB): ARTHROPLASTY, KNEE, TOTAL (Left)  Attending Physician:  Dr. Elspeth Her  Admission Diagnoses:   left knee end stage osteoarthritis  Discharge Diagnoses:  left knee end stage osteoarthritis   Past Medical History:  Diagnosis Date   Anxiety    Arthritis    Knees   COPD (chronic obstructive pulmonary disease) (HCC)    Depression    Does use hearing aid    Bilateral   Dyspnea    Dysrhythmia    Enlarged prostate    GERD (gastroesophageal reflux disease)    Hypercholesterolemia    Hypertension    Sleep apnea     PCP: Caro Harlene POUR, NP   Discharged Condition: good  Hospital Course:  Patient underwent the above stated procedure on 05/05/2024. Patient tolerated the procedure well and brought to the recovery room in good condition and subsequently to the floor. Patient had an uncomplicated hospital course and was stable for discharge.   Disposition: Discharge disposition: 03-Skilled Nursing Facility      with follow up in 2 weeks    Contact information for follow-up providers     Her Kemps, MD. Go on 05/18/2024.   Specialty: Orthopedic Surgery Why: You are scheduled for first postop appointment on Thursday November 6 at 10:30am. Contact  information: [address] [place] [address] [phone]         Her Kemps, MD .   Specialty: Orthopedic Surgery Contact information: [address] [place] [address] [place] [place] [address] [phone]              Contact information for after-discharge care     Destination     [facility] .   Service: Skilled Nursing Contact information: [address] [place]  [address] [phone]                     Dental Antibiotics:  In most cases prophylactic antibiotics for Dental procdeures after total joint surgery are not necessary.  Exceptions are as follows:  1. History of prior total joint infection  2. Severely immunocompromised (Organ Transplant, cancer chemotherapy, Rheumatoid biologic meds such as Humera)  3. Poorly controlled diabetes (A1C &gt; 8.0, blood glucose over 200)  If you have one of these conditions, contact your surgeon for an antibiotic prescription, prior to your dental procedure.  Discharge Instructions     Call MD / Call 911   Complete by: As directed    If you experience chest pain or shortness of breath, CALL 911 and be transported to the hospital emergency room.  If you develope a fever above 101 F, pus (white drainage) or increased drainage or redness at the wound, or calf pain, call your surgeon's office.   Call MD / Call 911   Complete by: As directed    If you experience chest pain or  shortness of breath, CALL 911 and be transported to the hospital emergency room.  If you develope a fever above 101 F, pus (white drainage) or increased drainage or redness at the wound, or calf pain, call your surgeon's office.   Constipation Prevention   Complete by: As directed    Drink plenty of fluids.  Prune juice may be helpful.  You may use a stool softener, such as Colace (over the counter) 100 mg twice a day.  Use MiraLax  (over the counter) for constipation as needed.   Constipation  Prevention   Complete by: As directed    Drink plenty of fluids.  Prune juice may be helpful.  You may use a stool softener, such as Colace (over the counter) 100 mg twice a day.  Use MiraLax  (over the counter) for constipation as needed.   Diet - low sodium heart healthy   Complete by: As directed    Diet - low sodium heart healthy   Complete by: As directed    Increase activity slowly as tolerated   Complete by: As directed    Increase activity slowly as tolerated   Complete by: As directed    Post-operative opioid taper instructions:   Complete by: As directed    POST-OPERATIVE OPIOID TAPER INSTRUCTIONS: It is important to wean off of your opioid medication as soon as possible. If you do not need pain medication after your surgery it is ok to stop day one. Opioids include: Codeine, Hydrocodone(Norco, Vicodin), Oxycodone (Percocet, oxycontin ) and hydromorphone  amongst others.  Long term and even short term use of opiods can cause: Increased pain response Dependence Constipation Depression Respiratory depression And more.  Withdrawal symptoms can include Flu like symptoms Nausea, vomiting And more Techniques to manage these symptoms Hydrate well Eat regular healthy meals Stay active Use relaxation techniques(deep breathing, meditating, yoga) Do Not substitute Alcohol to help with tapering If you have been on opioids for less than two weeks and do not have pain than it is ok to stop all together.  Plan to wean off of opioids This plan should start within one week post op of your joint replacement. Maintain the same interval or time between taking each dose and first decrease the dose.  Cut the total daily intake of opioids by one tablet each day Next start to increase the time between doses. The last dose that should be eliminated is the evening dose.      Post-operative opioid taper instructions:   Complete by: As directed    POST-OPERATIVE OPIOID TAPER INSTRUCTIONS: It  is important to wean off of your opioid medication as soon as possible. If you do not need pain medication after your surgery it is ok to stop day one. Opioids include: Codeine, Hydrocodone(Norco, Vicodin), Oxycodone (Percocet, oxycontin ) and hydromorphone  amongst others.  Long term and even short term use of opiods can cause: Increased pain response Dependence Constipation Depression Respiratory depression And more.  Withdrawal symptoms can include Flu like symptoms Nausea, vomiting And more Techniques to manage these symptoms Hydrate well Eat regular healthy meals Stay active Use relaxation techniques(deep breathing, meditating, yoga) Do Not substitute Alcohol to help with tapering If you have been on opioids for less than two weeks and do not have pain than it is ok to stop all together.  Plan to wean off of opioids This plan should start within one week post op of your joint replacement. Maintain the same interval or time between taking each dose and first decrease the dose.  Cut the total daily intake of opioids by one tablet each day Next start to increase the time between doses. The last dose that should be eliminated is the evening dose.          Allergies as of 05/09/2024       Reactions   Bee Venom Anaphylaxis, Swelling   Throat, mouth swelling        Medication List     TAKE these medications    acetaminophen  650 MG CR tablet Commonly known as: TYLENOL  Take 650 mg by mouth 3 (three) times daily as needed for fever or pain.   albuterol  (2.5 MG/3ML) 0.083% nebulizer solution Commonly known as: PROVENTIL  Take 3 mLs (2.5 mg total) by nebulization every 6 (six) hours as needed for wheezing or shortness of breath.   aspirin  EC 81 MG tablet Take 1 tablet (81 mg total) by mouth 2 (two) times daily. What changed: when to take this   Calcium 600+D3 600-20 MG-MCG Tabs Generic drug: Calcium Carb-Cholecalciferol Take 1 tablet by mouth at bedtime.   calcium  carbonate 500 MG chewable tablet Commonly known as: TUMS - dosed in mg elemental calcium Chew 2 tablets by mouth every 4 (four) hours as needed for indigestion or heartburn.   Cod Liver Oil Caps Take 1 capsule by mouth daily.   cyanocobalamin  500 MCG tablet Commonly known as: VITAMIN B12 Take 500 mcg by mouth daily.   cyclobenzaprine  10 MG tablet Commonly known as: FLEXERIL  TAKE 1 TABLET BY MOUTH TWICE  DAILY AS NEEDED FOR MUSCLE  SPASM(S)   escitalopram  20 MG tablet Commonly known as: LEXAPRO  Take 0.5 tablets (10 mg total) by mouth daily. What changed: when to take this   finasteride  5 MG tablet Commonly known as: PROSCAR  TAKE 1 TABLET EVERY DAY What changed: when to take this   fluticasone  50 MCG/ACT nasal spray Commonly known as: FLONASE  USE 1 SPRAY IN BOTH NOSTRILS DAILY What changed: See the new instructions.   Garlic 1000 MG Caps Take 1,000 mg by mouth at bedtime.   losartan  100 MG tablet Commonly known as: COZAAR  Take 1 tablet (100 mg total) by mouth daily.   melatonin 5 MG Tabs Take 5 mg by mouth at bedtime.   methocarbamol 500 MG tablet Commonly known as: ROBAXIN Take 1 tablet (500 mg total) by mouth every 8 (eight) hours as needed for muscle spasms.   metoprolol  succinate 25 MG 24 hr tablet Commonly known as: TOPROL -XL Take 1 tablet (25 mg total) by mouth daily.   MSM 1000 MG Caps Take 1,000 mg by mouth 2 (two) times daily.   Multivitamin Men 50+ Tabs Take 1 tablet by mouth daily.   ondansetron  4 MG tablet Commonly known as: ZOFRAN  Take 4 mg by mouth every 8 (eight) hours as needed for nausea or vomiting. What changed: Another medication with the same name was added. Make sure you understand how and when to take each.   ondansetron  4 MG tablet Commonly known as: Zofran  Take 1 tablet (4 mg total) by mouth every 8 (eight) hours as needed. What changed: You were already taking a medication with the same name, and this prescription was added. Make  sure you understand how and when to take each.   oxyCODONE  5 MG immediate release tablet Commonly known as: Roxicodone  Take 1 tablet (5 mg total) by mouth every 8 (eight) hours as needed for severe pain (pain score 7-10) or breakthrough pain.   pantoprazole  40 MG tablet Commonly known as: PROTONIX   TAKE 1 TABLET EVERY DAY   polyethylene glycol 17 g packet Commonly known as: MIRALAX  / GLYCOLAX  Take 17 g by mouth daily as needed (constipation).   Saw Palmetto 450 MG Caps Take 450 mg by mouth daily.   selenium  200 MCG Tabs tablet Take 200 mcg by mouth at bedtime.   simvastatin  20 MG tablet Commonly known as: ZOCOR  Take 20 mg by mouth at bedtime. What changed: Another medication with the same name was removed. Continue taking this medication, and follow the directions you see here.   SODIUM FLUORIDE 5000 ENAMEL DT Place 1 application  onto teeth at bedtime.   Spiriva  Respimat 2.5 MCG/ACT Aers Generic drug: Tiotropium Bromide  Inhale 2 puffs into the lungs daily.   SUPER B COMPLEX PO Take 1 tablet by mouth daily.   tamsulosin  0.4 MG Caps capsule Commonly known as: FLOMAX  TAKE 1 CAPSULE EVERY DAY What changed: when to take this   triamcinolone  cream 0.1 % Commonly known as: KENALOG  APPLY  CREAM EXTERNALLY TO AFFECTED AREA TWICE DAILY What changed: See the new instructions.   valACYclovir  1000 MG tablet Commonly known as: Valtrex  Take 2 tablets (2,000 mg total) by mouth 2 (two) times daily. What changed:  when to take this reasons to take this additional instructions   VITAMIN C  WITH ROSE HIPS PO Take 1 tablet by mouth at bedtime.   Vitamin D3 125 MCG (5000 UT) Caps Take 1 capsule by mouth daily.   vitamin E 180 MG (400 UNITS) capsule Take 400 Units by mouth at bedtime.          Signed: Debby KATHEE Fireman 05/09/2024, 8:23 AM  [facility] is now [facility] [address], [place], [place] [address] Phone:  [phone] Facebook  Instagram  [employer]

## 2024-05-11 ENCOUNTER — Other Ambulatory Visit: Payer: Self-pay | Admitting: Nurse Practitioner

## 2024-05-11 ENCOUNTER — Encounter: Payer: Self-pay | Admitting: Internal Medicine

## 2024-05-11 ENCOUNTER — Other Ambulatory Visit: Payer: Self-pay | Admitting: Internal Medicine

## 2024-05-11 LAB — COMPREHENSIVE METABOLIC PANEL WITH GFR
Calcium: 8.9 (ref 8.7–10.7)
eGFR: 99

## 2024-05-11 LAB — BASIC METABOLIC PANEL WITH GFR
BUN: 24 — AB (ref 4–21)
CO2: 24 — AB (ref 13–22)
Chloride: 106 (ref 99–108)
Creatinine: 0.6 (ref 0.6–1.3)
Glucose: 88
Potassium: 3.9 meq/L (ref 3.5–5.1)
Sodium: 138 (ref 137–147)

## 2024-05-11 LAB — CBC AND DIFFERENTIAL
HCT: 34 — AB (ref 41–53)
Hemoglobin: 11.2 — AB (ref 13.5–17.5)
Neutrophils Absolute: 3260
Platelets: 153 K/uL (ref 150–400)
WBC: 5.8

## 2024-05-11 LAB — CBC: RBC: 3.6 — AB (ref 3.87–5.11)

## 2024-05-11 MED ORDER — OXYCODONE HCL 5 MG PO TABS: 5.0000 mg | ORAL_TABLET | Freq: Three times a day (TID) | ORAL | 0 refills | Status: AC | PRN

## 2024-05-11 MED ORDER — OXYCODONE HCL 5 MG PO TABS: 5.0000 mg | ORAL_TABLET | Freq: Three times a day (TID) | ORAL | 0 refills | Status: DC | PRN

## 2024-05-16 ENCOUNTER — Encounter: Payer: Self-pay | Admitting: Nurse Practitioner

## 2024-05-16 ENCOUNTER — Non-Acute Institutional Stay: Payer: Self-pay | Admitting: Nurse Practitioner

## 2024-05-16 DIAGNOSIS — M1712 Unilateral primary osteoarthritis, left knee: Secondary | ICD-10-CM

## 2024-05-16 DIAGNOSIS — K5903 Drug induced constipation: Secondary | ICD-10-CM | POA: Diagnosis not present

## 2024-05-16 DIAGNOSIS — K219 Gastro-esophageal reflux disease without esophagitis: Secondary | ICD-10-CM | POA: Diagnosis not present

## 2024-05-16 DIAGNOSIS — R350 Frequency of micturition: Secondary | ICD-10-CM | POA: Diagnosis not present

## 2024-05-16 DIAGNOSIS — I10 Essential (primary) hypertension: Secondary | ICD-10-CM | POA: Diagnosis not present

## 2024-05-16 DIAGNOSIS — N401 Enlarged prostate with lower urinary tract symptoms: Secondary | ICD-10-CM | POA: Diagnosis not present

## 2024-05-16 NOTE — Progress Notes (Unsigned)
 Location:   [facility].   Place of Service:   [facility] [place] [address]  PCP: Caro Harlene POUR, NP  Patient Care Team: Caro Harlene POUR, NP as PCP - General (Geriatric Medicine) Darliss Rogue, MD as PCP - Cardiology (Cardiology)  Extended Emergency Contact Information Primary Emergency Contact: Zornes/Cunningham,Suellen  [place] Home Phone: [phone] Mobile Phone: [phone] Relation: Sister Secondary Emergency Contact: Costley,Neddy  [place] Home Phone: [phone] Mobile Phone: [phone] Relation: Son  Goals of care: Advanced Directive information    05/05/2024   12:07 PM  Advanced Directives  Type of Advance Directive Healthcare Power of [place];Living will  Does patient want to make changes to medical advance directive? Yes (ED - send information to MyChart)     Chief Complaint  Patient presents with   Discharge Note    Discharge.     HPI:  Pt is a 78 y.o. male seen today for discharge home. He underwent left knee replaced due to osteoarthritis.  He had his right knee done in June. He has hx of htn, COPD, BPH His pain has been well controlled. Having some constipation due to pain management but controlled with medication.  He is using his walker to get around. Has follow up scheduled with ortho.  Plans to go home with help of aide and family   Past Medical History:  Diagnosis Date   Anxiety    Arthritis    Knees   COPD (chronic obstructive pulmonary disease) (HCC)    Depression    Does use hearing aid    Bilateral   Dyspnea    Dysrhythmia    Enlarged prostate    GERD (gastroesophageal reflux disease)    Hypercholesterolemia    Hypertension    Sleep apnea    Past Surgical History:  Procedure Laterality Date   CHEEK AUGMENTATION Left 1970   REstructure of cheek bone    COLONOSCOPY  2017   Dr. Nellie   COLONOSCOPY WITH PROPOFOL  N/A 05/05/2022   Procedure: COLONOSCOPY WITH PROPOFOL ;  Surgeon:  Jinny Carmine, MD;  Location: [facility];  Service: Endoscopy;  Laterality: N/A;   ESOPHAGOGASTRODUODENOSCOPY (EGD) WITH PROPOFOL  N/A 12/05/2020   Procedure: ESOPHAGOGASTRODUODENOSCOPY (EGD) WITH PROPOFOL ;  Surgeon: Jinny Carmine, MD;  Location: [facility];  Service: Endoscopy;  Laterality: N/A;   EYE SURGERY Bilateral 2005   NASAL SEPTOPLASTY W/ TURBINOPLASTY Bilateral 12/24/2020   Procedure: NASAL SEPTOPLASTY WITH INFERIOR TURBINATE REDUCTION;  Surgeon: Milissa Hamming, MD;  Location: [facility];  Service: ENT;  Laterality: Bilateral;   TOTAL KNEE ARTHROPLASTY Right 12/17/2023   Procedure: ARTHROPLASTY, KNEE, TOTAL;  Surgeon: Kay Kemps, MD;  Location: [facility];  Service: Orthopedics;  Laterality: Right;   TOTAL KNEE ARTHROPLASTY Left 05/05/2024   Procedure: ARTHROPLASTY, KNEE, TOTAL;  Surgeon: Kay Kemps, MD;  Location: [facility];  Service: Orthopedics;  Laterality: Left;   UPPER GI ENDOSCOPY      Allergies  Allergen Reactions   Bee Pollen Anaphylaxis    Bee Venom   Bee Venom Anaphylaxis and Swelling    Throat, mouth swelling    Outpatient Encounter Medications as of 05/16/2024  Medication Sig   acetaminophen  (TYLENOL ) 500 MG tablet Take 1,000 mg by mouth every 8 (eight) hours as needed.   albuterol  (PROVENTIL ) (2.5 MG/3ML) 0.083% nebulizer solution Take 3 mLs (2.5 mg total) by nebulization every 6 (six) hours as needed for wheezing or shortness of breath.   aspirin  EC 81 MG tablet Take 1 tablet (81 mg  total) by mouth 2 (two) times daily.   Calcium Carb-Cholecalciferol (CALCIUM 600+D3) 600-20 MG-MCG TABS Take 1 tablet by mouth at bedtime.   Cholecalciferol (VITAMIN D3) 125 MCG (5000 UT) CAPS Take 1 capsule by mouth daily.   cyanocobalamin  (VITAMIN B12) 500 MCG tablet Take 500 mcg by mouth daily.   cyclobenzaprine  (FLEXERIL ) 10 MG tablet TAKE 1 TABLET BY MOUTH TWICE  DAILY AS NEEDED FOR MUSCLE  SPASM(S)   escitalopram  (LEXAPRO ) 20 MG tablet Take 0.5 tablets (10 mg total) by mouth  daily.   finasteride  (PROSCAR ) 5 MG tablet TAKE 1 TABLET EVERY DAY   fluticasone  (FLONASE ) 50 MCG/ACT nasal spray USE 1 SPRAY IN BOTH NOSTRILS DAILY   ipratropium (ATROVENT) 0.02 % nebulizer solution Take 0.5 mg by nebulization 2 (two) times daily.   losartan  (COZAAR ) 100 MG tablet Take 1 tablet (100 mg total) by mouth daily.   melatonin 5 MG TABS Take 5 mg by mouth at bedtime.    methocarbamol (ROBAXIN) 500 MG tablet Take 1 tablet (500 mg total) by mouth every 8 (eight) hours as needed for muscle spasms.   metoprolol  succinate (TOPROL -XL) 25 MG 24 hr tablet Take 1 tablet (25 mg total) by mouth daily.   Multiple Vitamins-Minerals (MULTIVITAMIN MEN 50+) TABS Take 1 tablet by mouth daily.   ondansetron  (ZOFRAN ) 4 MG tablet Take 1 tablet (4 mg total) by mouth every 8 (eight) hours as needed.   oxyCODONE  (ROXICODONE ) 5 MG immediate release tablet Take 1 tablet (5 mg total) by mouth every 8 (eight) hours as needed for severe pain (pain score 7-10) or moderate pain (pain score 4-6). (Patient taking differently: Take 5 mg by mouth every 6 (six) hours as needed for severe pain (pain score 7-10) or moderate pain (pain score 4-6).)   pantoprazole  (PROTONIX ) 40 MG tablet TAKE 1 TABLET EVERY DAY   polyethylene glycol (MIRALAX  / GLYCOLAX ) 17 g packet Take 17 g by mouth daily as needed (constipation).   simvastatin  (ZOCOR ) 20 MG tablet Take 20 mg by mouth at bedtime.   tamsulosin  (FLOMAX ) 0.4 MG CAPS capsule TAKE 1 CAPSULE EVERY DAY   acetaminophen  (TYLENOL ) 325 MG tablet Take 650 mg by mouth every 8 (eight) hours as needed. (Patient not taking: Reported on 05/16/2024)   acetaminophen  (TYLENOL ) 650 MG CR tablet Take 650 mg by mouth 3 (three) times daily as needed for fever or pain. (Patient not taking: Reported on 05/16/2024)   albuterol  (VENTOLIN  HFA) 108 (90 Base) MCG/ACT inhaler Inhale 1-2 puffs into the lungs every 6 (six) hours as needed for wheezing or shortness of breath. (Patient not taking: Reported on  05/16/2024)   Ascorbic Acid  (VITAMIN C  WITH ROSE HIPS PO) Take 1 tablet by mouth at bedtime. (Patient not taking: Reported on 05/16/2024)   B Complex-C (SUPER B COMPLEX PO) Take 1 tablet by mouth daily. (Patient not taking: Reported on 05/16/2024)   calcium carbonate (TUMS - DOSED IN MG ELEMENTAL CALCIUM) 500 MG chewable tablet Chew 2 tablets by mouth every 4 (four) hours as needed for indigestion or heartburn. (Patient not taking: Reported on 05/16/2024)   Cod Liver Oil CAPS Take 1 capsule by mouth daily. (Patient not taking: Reported on 05/16/2024)   Garlic 1000 MG CAPS Take 1,000 mg by mouth at bedtime. (Patient not taking: Reported on 05/16/2024)   Methylsulfonylmethane (MSM) 1000 MG CAPS Take 1,000 mg by mouth 2 (two) times daily. (Patient not taking: Reported on 05/16/2024)   Saw Palmetto 450 MG CAPS Take 450 mg by mouth  daily. (Patient not taking: Reported on 05/16/2024)   selenium  200 MCG TABS tablet Take 200 mcg by mouth at bedtime. (Patient not taking: Reported on 05/16/2024)   Sod Fluoride-Potassium Nitrate (SODIUM FLUORIDE 5000 ENAMEL DT) Place 1 application  onto teeth at bedtime. (Patient not taking: Reported on 05/16/2024)   Tiotropium Bromide  Monohydrate (SPIRIVA  RESPIMAT) 2.5 MCG/ACT AERS Inhale 2 puffs into the lungs daily. (Patient not taking: Reported on 05/16/2024)   triamcinolone  cream (KENALOG ) 0.1 % APPLY  CREAM EXTERNALLY TO AFFECTED AREA TWICE DAILY (Patient not taking: Reported on 05/16/2024)   valACYclovir  (VALTREX ) 1000 MG tablet Take 2 tablets (2,000 mg total) by mouth 2 (two) times daily. (Patient not taking: Reported on 05/16/2024)   vitamin E 180 MG (400 UNITS) capsule Take 400 Units by mouth at bedtime. (Patient not taking: Reported on 05/16/2024)   No facility-administered encounter medications on file as of 05/16/2024.    Review of Systems  Constitutional:  Negative for activity change, appetite change, fatigue and unexpected weight change.  HENT:  Negative for congestion and  hearing loss.   Eyes: Negative.   Respiratory:  Negative for cough and shortness of breath.   Cardiovascular:  Negative for chest pain, palpitations and leg swelling.  Gastrointestinal:  Negative for abdominal pain, constipation and diarrhea.  Genitourinary:  Negative for difficulty urinating and dysuria.  Musculoskeletal:  Positive for arthralgias. Negative for myalgias.  Skin:  Negative for color change and wound.  Neurological:  Negative for dizziness and weakness.  Psychiatric/Behavioral:  Negative for agitation, behavioral problems and confusion.    Immunization History  Administered Date(s) Administered   INFLUENZA, HIGH DOSE SEASONAL PF 04/11/2015, 04/15/2016   Influenza-Unspecified 05/14/2019, 05/02/2020, 04/24/2021, 04/10/2022, 04/13/2023, 03/12/2024   Moderna Covid-19 Fall Seasonal Vaccine 74yrs & older 10/20/2022, 04/11/2024   Moderna Covid-19 Vaccine Bivalent Booster 31yrs & up 04/22/2021, 12/09/2021, 10/20/2022   Moderna Sars-Covid-2 Vaccination 07/25/2019, 08/16/2019, 09/13/2019, 05/28/2020, 11/28/2020   PNEUMOCOCCAL CONJUGATE-20 01/14/2023   Pneumococcal Conjugate-13 04/13/2015, 05/13/2015   Pneumococcal Polysaccharide-23 11/24/2012   RSV,unspecified 06/11/2022   Tdap 12/11/2016, 12/21/2022   Unspecified SARS-COV-2 Vaccination 06/11/2022, 04/20/2023, 10/22/2023   Zoster Recombinant(Shingrix) 03/18/2022, 12/15/2022   Zoster, Live 06/13/2007   Pertinent  Health Maintenance Due  Topic Date Due   Colonoscopy  05/06/2027   Influenza Vaccine  Completed      01/04/2024   10:46 AM 03/08/2024   11:39 AM 03/09/2024    9:56 AM 04/04/2024   10:47 AM 04/13/2024    8:24 AM  Fall Risk  Falls in the past year? 0 0 1 1 1   Was there an injury with Fall? 0 0 0 1 1  Fall Risk Category Calculator 0 0  2 3 3   Patient at Risk for Falls Due to No Fall Risks  No Fall Risks Impaired balance/gait No Fall Risks  Fall risk Follow up Falls evaluation completed  Falls evaluation completed  Falls evaluation completed Falls evaluation completed     Patient-reported   Functional Status Survey:    Vitals:   05/16/24 1407  BP: 131/74  Pulse: 64  Resp: 18  Temp: (!) 97.4 F (36.3 C)  SpO2: 96%  Weight: 188 lb (85.3 kg)  Height: 6' (1.829 m)   Body mass index is 25.5 kg/m. Physical Exam Constitutional:      General: He is not in acute distress.    Appearance: He is well-developed. He is not diaphoretic.  HENT:     Head: Normocephalic and atraumatic.     Right Ear:  External ear normal.     Left Ear: External ear normal.     Mouth/Throat:     Pharynx: No oropharyngeal exudate.  Eyes:     Conjunctiva/sclera: Conjunctivae normal.     Pupils: Pupils are equal, round, and reactive to light.  Cardiovascular:     Rate and Rhythm: Normal rate and regular rhythm.     Heart sounds: Normal heart sounds.  Pulmonary:     Effort: Pulmonary effort is normal.     Breath sounds: Normal breath sounds.  Abdominal:     General: Bowel sounds are normal.     Palpations: Abdomen is soft.  Musculoskeletal:        General: No tenderness.     Cervical back: Normal range of motion and neck supple.     Right lower leg: No edema.     Left lower leg: Edema present.     Comments: Left knee with dressing intact.   Skin:    General: Skin is warm and dry.  Neurological:     Mental Status: He is alert and oriented to person, place, and time.     Labs reviewed: Recent Labs    10/11/23 0745 12/13/23 1039 12/23/23 0000 05/01/24 0918 05/11/24 0000  NA 140 137 139 140 138  K 3.9 4.2 4.1 5.9* 3.9  CL 108 108 105 106 106  CO2 24 22 27* 26 24*  GLUCOSE 92 85  --  121*  --   BUN 22 15 18 19  24*  CREATININE 0.77 0.85 0.7 0.81 0.6  CALCIUM 9.4 9.3 8.9 10.5* 8.9   Recent Labs    10/11/23 0745  AST 17  ALT 16  BILITOT 1.1  PROT 6.8   Recent Labs    10/11/23 0745 12/13/23 1039 12/23/23 0000 05/01/24 0918 05/11/24 0000  WBC 6.2 5.9 5.1 5.8 5.8  NEUTROABS 3,174  --   2,825.00  --  3,260.00  HGB 15.6 15.0 11.1* 15.0 11.2*  HCT 45.7 44.6 33* 46.0 34*  MCV 91.8 95.5  --  90.9  --   PLT 133* 159 144* 140* 153   No results found for: TSH No results found for: HGBA1C Lab Results  Component Value Date   CHOL 195 10/11/2023   HDL 48 10/11/2023   LDLCALC 124 (H) 10/11/2023   TRIG 115 10/11/2023   CHOLHDL 4.1 10/11/2023    Significant Diagnostic Results in last 30 days:  No results found.  Assessment/Plan 1. Primary osteoarthritis of left knee (Primary) S/p total knee replacement on 10/24 Doing well post op Pain controlled Has follow up scheduled with orthopedic  2. Essential hypertension -controlled at this time Continue home regimen  3. Drug-induced constipation Continue miralax  daily as needed, incrase water  and to reduce pain medication as tolerates   4. Benign prostatic hyperplasia with urinary frequency Controlled at this time- continue proscar  and flomax   5. Gastroesophageal reflux disease without esophagitis -well controlled on protonix    Stable for discharge, will have therapy at [facility] and home aide to help     [place] K. Caro BODILY [facility] [phone]

## 2024-05-18 DIAGNOSIS — Z4889 Encounter for other specified surgical aftercare: Secondary | ICD-10-CM | POA: Diagnosis not present

## 2024-05-24 NOTE — Addendum Note (Signed)
 Addended by: Marli Diego K on: 05/24/2024 12:40 PM   Modules accepted: Level of Service

## 2024-07-11 ENCOUNTER — Telehealth: Payer: Self-pay | Admitting: *Deleted

## 2024-07-11 NOTE — Telephone Encounter (Signed)
 Patient called requesting an appointment to see Greig Cluster, NP next week for Neck and hip pain. Currently out of town.  Appointment scheduled as requested and confirmed.

## 2024-07-19 ENCOUNTER — Non-Acute Institutional Stay: Admitting: Orthopedic Surgery

## 2024-07-19 ENCOUNTER — Encounter: Payer: Self-pay | Admitting: Orthopedic Surgery

## 2024-07-19 VITALS — BP 132/66 | HR 61 | Temp 98.0°F | Ht 72.0 in | Wt 188.0 lb

## 2024-07-19 DIAGNOSIS — M542 Cervicalgia: Secondary | ICD-10-CM | POA: Diagnosis not present

## 2024-07-19 MED ORDER — PREDNISONE 20 MG PO TABS: ORAL_TABLET | ORAL | 0 refills | Status: AC

## 2024-07-19 NOTE — Progress Notes (Signed)
 " Location:  Other Nursing Home Room Number: [facility] Place of Service:  Clinic (12) Provider:  Greig FORBES Cluster, NP   Caro Harlene POUR, NP  Patient Care Team: Caro Harlene POUR, NP as PCP - General (Geriatric Medicine) Darliss Rogue, MD as PCP - Cardiology (Cardiology)  Extended Emergency Contact Information Primary Emergency Contact: Zornes/Cunningham,[place]ellen  [place] Home Phone: [phone] Mobile Phone: [phone] Relation: Sister Secondary Emergency Contact: Delores Jim  [place] Home Phone: [phone] Mobile Phone: [phone] Relation: Son  Code Status:  Full code  Goals of care: Advanced Directive information    05/05/2024   12:07 PM  Advanced Directives  Type of Advance Directive Healthcare Power of [place];Living will  Does patient want to make changes to medical advance directive? Yes (ED - send information to MyChart)     Chief Complaint  Patient presents with   Neck Pain    Neck Pain on the Right side up into head for over a long period of time.     HPI:  Pt is a 79 y.o. male seen today for acute visit due to neck pain.  Discussed the use of AI scribe software for clinical note transcription with the patient, who gave verbal consent to proceed.  History of Present Illness   Joshua Mercado is an 79 year old male with cervical spine stenosis who presents with neck pain.  He has been experiencing right sided neck pain for the past two weeks, which radiates to the side of his head, especially when turning his head to the right. Pain is increased with movement. The pain began before he switched to a new sleep pillow, which he hoped would alleviate the symptoms.  He reports a history of cervical spine issues and recalls that a prior x-ray in 2023 showed some narrowing in his neck. Despite these findings, there is no numbness or tingling down his arms. He has previously consulted a neurosurgeon who performed a CT  scan and advised that there was no need for surgical intervention at that time.  He has tried using a supplement he refers to as 'CO2 something' and has previously taken prednisone  for similar issues, though he is not currently on it. He uses Tylenol  as needed for pain relief.     No recent injury of fall.   Past Medical History:  Diagnosis Date   Anxiety    Arthritis    Knees   COPD (chronic obstructive pulmonary disease) (HCC)    Depression    Does use hearing aid    Bilateral   Dyspnea    Dysrhythmia    Enlarged prostate    GERD (gastroesophageal reflux disease)    Hypercholesterolemia    Hypertension    Sleep apnea    Past Surgical History:  Procedure Laterality Date   CHEEK AUGMENTATION Left 1970   REstructure of cheek bone    COLONOSCOPY  2017   Dr. Nellie   COLONOSCOPY WITH PROPOFOL  N/A 05/05/2022   Procedure: COLONOSCOPY WITH PROPOFOL ;  Surgeon: Jinny Carmine, MD;  Location: [facility];  Service: Endoscopy;  Laterality: N/A;   ESOPHAGOGASTRODUODENOSCOPY (EGD) WITH PROPOFOL  N/A 12/05/2020   Procedure: ESOPHAGOGASTRODUODENOSCOPY (EGD) WITH PROPOFOL ;  Surgeon: Jinny Carmine, MD;  Location: [facility];  Service: Endoscopy;  Laterality: N/A;   EYE SURGERY Bilateral 2005   NASAL SEPTOPLASTY W/ TURBINOPLASTY Bilateral 12/24/2020   Procedure: NASAL SEPTOPLASTY WITH INFERIOR TURBINATE REDUCTION;  Surgeon: Milissa Hamming, MD;  Location: [facility] OR[facility];  Service: ENT;  Laterality:  Bilateral;   TOTAL KNEE ARTHROPLASTY Right 12/17/2023   Procedure: ARTHROPLASTY, KNEE, TOTAL;  Surgeon: Kay Kemps, MD;  Location: [facility];  Service: Orthopedics;  Laterality: Right;   TOTAL KNEE ARTHROPLASTY Left 05/05/2024   Procedure: ARTHROPLASTY, KNEE, TOTAL;  Surgeon: Kay Kemps, MD;  Location: [facility];  Service: Orthopedics;  Laterality: Left;   UPPER GI ENDOSCOPY      Allergies[1]  Outpatient Encounter Medications as of 07/19/2024  Medication Sig   acetaminophen  (TYLENOL ) 325 MG tablet  Take 650 mg by mouth every 8 (eight) hours as needed.   acetaminophen  (TYLENOL ) 500 MG tablet Take 1,000 mg by mouth every 8 (eight) hours as needed.   acetaminophen  (TYLENOL ) 650 MG CR tablet Take 650 mg by mouth 3 (three) times daily as needed for fever or pain.   albuterol  (PROVENTIL ) (2.5 MG/3ML) 0.083% nebulizer solution Take 3 mLs (2.5 mg total) by nebulization every 6 (six) hours as needed for wheezing or shortness of breath.   albuterol  (VENTOLIN  HFA) 108 (90 Base) MCG/ACT inhaler Inhale 1-2 puffs into the lungs every 6 (six) hours as needed for wheezing or shortness of breath.   Ascorbic Acid  (VITAMIN C  WITH ROSE HIPS PO) Take 1 tablet by mouth at bedtime.   aspirin  EC 81 MG tablet Take 1 tablet (81 mg total) by mouth 2 (two) times daily.   B Complex-C (SUPER B COMPLEX PO) Take 1 tablet by mouth daily.   Calcium  Carb-Cholecalciferol  (CALCIUM  600+D3) 600-20 MG-MCG TABS Take 1 tablet by mouth at bedtime.   calcium  carbonate (TUMS - DOSED IN MG ELEMENTAL CALCIUM ) 500 MG chewable tablet Chew 2 tablets by mouth every 4 (four) hours as needed for indigestion or heartburn.   Cholecalciferol  (VITAMIN D3) 125 MCG (5000 UT) CAPS Take 1 capsule by mouth daily.   Cod Liver Oil CAPS Take 1 capsule by mouth daily.   cyanocobalamin  (VITAMIN B12) 500 MCG tablet Take 500 mcg by mouth daily.   cyclobenzaprine  (FLEXERIL ) 10 MG tablet TAKE 1 TABLET BY MOUTH TWICE  DAILY AS NEEDED FOR MUSCLE  SPASM(S)   escitalopram  (LEXAPRO ) 20 MG tablet Take 0.5 tablets (10 mg total) by mouth daily.   finasteride  (PROSCAR ) 5 MG tablet TAKE 1 TABLET EVERY DAY   fluticasone  (FLONASE ) 50 MCG/ACT nasal spray USE 1 SPRAY IN BOTH NOSTRILS DAILY   Garlic  1000 MG CAPS Take 1,000 mg by mouth at bedtime.   ipratropium (ATROVENT) 0.02 % nebulizer solution Take 0.5 mg by nebulization 2 (two) times daily.   losartan  (COZAAR ) 100 MG tablet Take 1 tablet (100 mg total) by mouth daily.   melatonin 5 MG TABS Take 5 mg by mouth at bedtime.     methocarbamol  (ROBAXIN ) 500 MG tablet Take 1 tablet (500 mg total) by mouth every 8 (eight) hours as needed for muscle spasms.   Methylsulfonylmethane (MSM) 1000 MG CAPS Take 1,000 mg by mouth 2 (two) times daily.   metoprolol  succinate (TOPROL -XL) 25 MG 24 hr tablet Take 1 tablet (25 mg total) by mouth daily.   Multiple Vitamins-Minerals (MULTIVITAMIN MEN 50+) TABS Take 1 tablet by mouth daily.   ondansetron  (ZOFRAN ) 4 MG tablet Take 1 tablet (4 mg total) by mouth every 8 (eight) hours as needed.   pantoprazole  (PROTONIX ) 40 MG tablet TAKE 1 TABLET EVERY DAY   polyethylene glycol (MIRALAX  / GLYCOLAX ) 17 g packet Take 17 g by mouth daily as needed (constipation).   predniSONE  (DELTASONE ) 20 MG tablet Take 2 tablets (40 mg total) by mouth daily with  breakfast for 5 days, THEN 1 tablet (20 mg total) daily with breakfast for 5 days.   Saw Palmetto  450 MG CAPS Take 450 mg by mouth daily.   selenium  200 MCG TABS tablet Take 200 mcg by mouth at bedtime.   simvastatin  (ZOCOR ) 20 MG tablet Take 20 mg by mouth at bedtime.   Sod Fluoride -Potassium Nitrate  (SODIUM FLUORIDE  5000 ENAMEL DT) Place 1 application  onto teeth at bedtime.   tamsulosin  (FLOMAX ) 0.4 MG CAPS capsule TAKE 1 CAPSULE EVERY DAY   Tiotropium Bromide  Monohydrate (SPIRIVA  RESPIMAT) 2.5 MCG/ACT AERS Inhale 2 puffs into the lungs daily.   triamcinolone  cream (KENALOG ) 0.1 % APPLY  CREAM EXTERNALLY TO AFFECTED AREA TWICE DAILY   valACYclovir  (VALTREX ) 1000 MG tablet Take 2 tablets (2,000 mg total) by mouth 2 (two) times daily.   vitamin E  180 MG (400 UNITS) capsule Take 400 Units by mouth at bedtime.   No facility-administered encounter medications on file as of 07/19/2024.    Review of Systems  Constitutional:  Negative for fatigue and fever.  Respiratory:  Negative for cough and shortness of breath.   Cardiovascular:  Negative for chest pain and leg swelling.  Musculoskeletal:  Positive for neck pain and neck stiffness. Negative for  gait problem and myalgias.  Skin:  Negative for wound.  Neurological:  Negative for numbness.  Psychiatric/Behavioral:  Negative for dysphoric mood. The patient is not nervous/anxious.     Immunization History  Administered Date(s) Administered   INFLUENZA, HIGH DOSE SEASONAL PF 04/11/2015, 04/15/2016   Influenza-Unspecified 05/14/2019, 05/02/2020, 04/24/2021, 04/10/2022, 04/13/2023, 03/12/2024   Moderna Covid-19 Fall Seasonal Vaccine 79yrs & older 10/20/2022, 04/11/2024   Moderna Covid-19 Vaccine Bivalent Booster 61yrs & up 04/22/2021, 12/09/2021, 10/20/2022   Moderna Sars-Covid-2 Vaccination 07/25/2019, 08/16/2019, 09/13/2019, 05/28/2020, 11/28/2020   PNEUMOCOCCAL CONJUGATE-20 01/14/2023   Pneumococcal Conjugate-13 04/13/2015, 05/13/2015   Pneumococcal Polysaccharide-23 11/24/2012   RSV,unspecified 06/11/2022   Tdap 12/11/2016, 12/21/2022   Unspecified SARS-COV-2 Vaccination 06/11/2022, 04/20/2023, 10/22/2023   Zoster Recombinant(Shingrix) 03/18/2022, 12/15/2022   Zoster, Live 06/13/2007   Pertinent  Health Maintenance Due  Topic Date Due   Colonoscopy  05/06/2027   Influenza Vaccine  Completed      01/04/2024   10:46 AM 03/08/2024   11:39 AM 03/09/2024    9:56 AM 04/04/2024   10:47 AM 04/13/2024    8:24 AM  Fall Risk  Falls in the past year? 0 0  1 1 1   Was there an injury with Fall? 0  0   0  1  1   Fall Risk Category Calculator 0 0  2 3 3   Patient at Risk for Falls Due to No Fall Risks  No Fall Risks Impaired balance/gait No Fall Risks  Fall risk Follow up Falls evaluation completed  Falls evaluation completed Falls evaluation completed Falls evaluation completed     Manually entered by patient   Data saved with a previous flowsheet row definition   Functional Status Survey:    Vitals:   07/19/24 1439  BP: 132/66  Pulse: 61  Temp: 98 F (36.7 C)  SpO2: 99%  Weight: 188 lb (85.3 kg)  Height: 6' (1.829 m)   Body mass index is 25.5 kg/m. Physical Exam Vitals  reviewed.  Constitutional:      General: He is not in acute distress. HENT:     Head: Normocephalic.  Eyes:     General:        Right eye: No discharge.  Left eye: No discharge.  Musculoskeletal:        General: Tenderness present.     Cervical back: No edema, erythema, signs of trauma, rigidity or crepitus. Pain with movement present. Decreased range of motion.  Skin:    General: Skin is warm.     Capillary Refill: Capillary refill takes less than 2 seconds.  Neurological:     General: No focal deficit present.     Mental Status: He is alert and oriented to person, place, and time.  Psychiatric:        Mood and Affect: Mood normal.     Labs reviewed: Recent Labs    10/11/23 0745 12/13/23 1039 12/23/23 0000 05/01/24 0918 05/11/24 0000  NA 140 137 139 140 138  K 3.9 4.2 4.1 5.9* 3.9  CL 108 108 105 106 106  CO2 24 22 27* 26 24*  GLUCOSE 92 85  --  121*  --   BUN 22 15 18 19  24*  CREATININE 0.77 0.85 0.7 0.81 0.6  CALCIUM  9.4 9.3 8.9 10.5* 8.9   Recent Labs    10/11/23 0745  AST 17  ALT 16  BILITOT 1.1  PROT 6.8   Recent Labs    10/11/23 0745 12/13/23 1039 12/23/23 0000 05/01/24 0918 05/11/24 0000  WBC 6.2 5.9 5.1 5.8 5.8  NEUTROABS 3,174  --  2,825.00  --  3,260.00  HGB 15.6 15.0 11.1* 15.0 11.2*  HCT 45.7 44.6 33* 46.0 34*  MCV 91.8 95.5  --  90.9  --   PLT 133* 159 144* 140* 153   No results found for: TSH No results found for: HGBA1C Lab Results  Component Value Date   CHOL 195 10/11/2023   HDL 48 10/11/2023   LDLCALC 124 (H) 10/11/2023   TRIG 115 10/11/2023   CHOLHDL 4.1 10/11/2023    Significant Diagnostic Results in last 30 days:  No results found.  Assessment/Plan 1. Cervicalgia (Primary) - MRI cervical spine noted mild spinal stenosis C5-6 with severe right narrowing, C6-7 moderate narrowing, C3-4 mild-moderate right sided narrowing, C4-5 mild right narrowing  - limited ROM when moving neck to left, no radiation of  pain - evaluated by neurosurgery in past - will try prednisone  taper  - cont tylenol  prn - recommend light ROM neck exercises - discussed heating pad  - if no improvement, recommend scheduling with neurosurgery - predniSONE  (DELTASONE ) 20 MG tablet; Take 2 tablets (40 mg total) by mouth daily with breakfast for 5 days, THEN 1 tablet (20 mg total) daily with breakfast for 5 days.  Dispense: 15 tablet; Refill: 0    Family/ staff Communication: plan discussed with patient   Labs/tests ordered:  none      [1]  Allergies Allergen Reactions   Bee Pollen Anaphylaxis    Bee Venom   Bee Venom Anaphylaxis and Swelling    Throat, mouth swelling   "

## 2024-07-19 NOTE — Patient Instructions (Signed)
 Prednisone  prescriptions sent to pharmacy> take in the morning so you can sleep> may make you more hungry than normal   Do light neck exercises daily  Consider heating pad to neck in evening   Ok to take tylenol  1000 mg as needed up to 3x/day  If no better after completing prednisone  consider seeing neurosurgeon  [facility] [phone] is our fax #

## 2024-07-24 ENCOUNTER — Telehealth: Payer: Self-pay | Admitting: *Deleted

## 2024-07-24 NOTE — Telephone Encounter (Signed)
 Patient called and stated that he spoke with Amy at last appointment regarding his CPAP machine. Stated that he uses [facility] [employer] in [place] [phone].   I called and spoke with Mattie at [facility] and she stated that they need a Rx for Pressure Setting Changes faxed to them at Fax:[phone]  Please Advise.

## 2024-07-24 NOTE — Telephone Encounter (Signed)
 Called [facility] and spoke with [facility] and she stated that she was not sure what the patient is needing and she does not show anything in the computer.   I called patient and he stated that he has a CPAP but he feels that the settings need to be adjusted. I explained to him that we do not adjust CPAP Settings that he would need another sleep study done. I offered him an appointment and he stated that he will wait to discuss it at his March appointment.

## 2024-07-24 NOTE — Telephone Encounter (Signed)
 I am unable to give a script for pressure settings. The medical supply company, if they know settings, can fax a order to us  to sign. If this is unable to be done. Patient will need to see who the initial prescribing physician was.

## 2024-08-01 ENCOUNTER — Other Ambulatory Visit: Payer: Self-pay | Admitting: Orthopedic Surgery

## 2024-08-01 DIAGNOSIS — M542 Cervicalgia: Secondary | ICD-10-CM

## 2024-08-02 NOTE — Telephone Encounter (Signed)
 Please advise.

## 2024-08-04 ENCOUNTER — Encounter: Admitting: Orthopedic Surgery

## 2024-08-09 ENCOUNTER — Non-Acute Institutional Stay: Admitting: Orthopedic Surgery

## 2024-08-09 ENCOUNTER — Encounter: Payer: Self-pay | Admitting: Nurse Practitioner

## 2024-08-09 VITALS — BP 142/74 | HR 60 | Temp 99.2°F | Ht 72.0 in | Wt 188.0 lb

## 2024-08-09 DIAGNOSIS — J069 Acute upper respiratory infection, unspecified: Secondary | ICD-10-CM

## 2024-08-09 DIAGNOSIS — M542 Cervicalgia: Secondary | ICD-10-CM | POA: Diagnosis not present

## 2024-08-09 NOTE — Patient Instructions (Signed)
 Netipot or saline wash daily Plain nasal saline spray throughout the day as needed May use tylenol  500 mg 2 tablets every 8 hours as needed aches and pains or sore throat- ,max 6 tablets in 24 hours.  humidifier in the home to help with the dry air Mucinex DM by mouth twice daily as needed for cough and chest congestion with full glass of water   Keep well hydrated Proper nutrition  Avoid forcefully blowing nose Vit c 1000 mg daily Vit d 2000 units daily  Along with zinc 

## 2024-08-09 NOTE — Progress Notes (Signed)
 "   Careteam: Patient Care Team: Joshua Harlene POUR, NP as PCP - General (Geriatric Medicine) Darliss Rogue, MD as PCP - Cardiology (Cardiology) PLACE OF SERVICE:  [facility]   Advanced Directive information    Allergies[1]  Chief Complaint  Patient presents with   URI    URI. Runny Nose, Low Grade fever, cough and congestion. Been going on for a week. Taking Zicam with no relief. Requesting a refill on Prednisone .  Flu/Covid Test Done in Office:      HPI: Patient is a 79 y.o. male seen in today TL Discussed the use of AI scribe software for clinical note transcription with the patient, who gave verbal consent to proceed.  History of Present Illness Joshua Mercado is an 79 year old male with COPD who presents with symptoms of an upper respiratory infection.  He has been experiencing symptoms of an upper respiratory infection for the past five days, including rhinorrhea, low-grade fever, and significant nasal drainage. The symptoms began at the end of last week. He has tested negative for both influenza and COVID-19.  He has a cough and chest congestion, occasionally producing sputum. No shortness of breath, wheezing, or hemoptysis. He also has a slight sore throat, which is improving. He experiences fatigue and had a single episode of chills. No rashes, chest pains, palpitations, dizziness, or headaches. He is attempting to increase his water  intake.  For his symptoms, he is taking zicam, which contains zinc , and has been using a neti pot for nasal congestion. He also uses an albuterol  inhaler for his COPD.  In addition to his respiratory symptoms, he reports ongoing neck pain, rated as six out of ten in severity. He has been in physical therapy, primarily for his knees, but has discussed his neck pain with the therapist. Prednisone  has previously helped with the neck pain, but he is not currently using it. He has difficulty with neck movement, particularly when  driving.   Review of Systems:  Review of Systems  Constitutional:  Positive for chills, fever and malaise/fatigue.  HENT:  Positive for congestion and sore throat.   Respiratory:  Positive for cough and sputum production. Negative for hemoptysis, shortness of breath and wheezing.   Cardiovascular:  Negative for chest pain and palpitations.  Skin:  Negative for itching and rash.  Neurological:  Negative for dizziness and headaches.    Past Medical History:  Diagnosis Date   Anxiety    Arthritis    Knees   COPD (chronic obstructive pulmonary disease) (HCC)    Depression    Does use hearing aid    Bilateral   Dyspnea    Dysrhythmia    Enlarged prostate    GERD (gastroesophageal reflux disease)    Hypercholesterolemia    Hypertension    Sleep apnea    Past Surgical History:  Procedure Laterality Date   CHEEK AUGMENTATION Left 1970   REstructure of cheek bone    COLONOSCOPY  2017   Dr. Nellie   COLONOSCOPY WITH PROPOFOL  N/A 05/05/2022   Procedure: COLONOSCOPY WITH PROPOFOL ;  Surgeon: Jinny Carmine, MD;  Location: [facility] ENDOSCOPY;  Service: Endoscopy;  Laterality: N/A;   ESOPHAGOGASTRODUODENOSCOPY (EGD) WITH PROPOFOL  N/A 12/05/2020   Procedure: ESOPHAGOGASTRODUODENOSCOPY (EGD) WITH PROPOFOL ;  Surgeon: Jinny Carmine, MD;  Location: [facility] ENDOSCOPY;  Service: Endoscopy;  Laterality: N/A;   EYE SURGERY Bilateral 2005   NASAL SEPTOPLASTY W/ TURBINOPLASTY Bilateral 12/24/2020   Procedure: NASAL SEPTOPLASTY WITH INFERIOR TURBINATE REDUCTION;  Surgeon: Milissa Hamming, MD;  Location: [facility] ORS;  Service: ENT;  Laterality: Bilateral;   TOTAL KNEE ARTHROPLASTY Right 12/17/2023   Procedure: ARTHROPLASTY, KNEE, TOTAL;  Surgeon: Kay Kemps, MD;  Location: [facility] ORS;  Service: Orthopedics;  Laterality: Right;   TOTAL KNEE ARTHROPLASTY Left 05/05/2024   Procedure: ARTHROPLASTY, KNEE, TOTAL;  Surgeon: Kay Kemps, MD;  Location: [facility] ORS;  Service: Orthopedics;  Laterality: Left;   UPPER GI  ENDOSCOPY     Social History:   reports that he quit smoking about 41 years ago. His smoking use included cigarettes. He started smoking about 76 years ago. He has a 35 pack-year smoking history. He has never used smokeless tobacco. He reports that he does not currently use alcohol. He reports that he does not use drugs.  Family History  Problem Relation Age of Onset   Alzheimer's disease Mother    Diabetes Mother    Heart failure Father    Heart attack Father    Diabetes Father    Dementia Father     Medications: Patient's Medications  New Prescriptions   No medications on file  Previous Medications   ACETAMINOPHEN  (TYLENOL ) 325 MG TABLET    Take 650 mg by mouth every 8 (eight) hours as needed.   ACETAMINOPHEN  (TYLENOL ) 500 MG TABLET    Take 1,000 mg by mouth every 8 (eight) hours as needed.   ACETAMINOPHEN  (TYLENOL ) 650 MG CR TABLET    Take 650 mg by mouth 3 (three) times daily as needed for fever or pain.   ALBUTEROL  (PROVENTIL ) (2.5 MG/3ML) 0.083% NEBULIZER SOLUTION    Take 3 mLs (2.5 mg total) by nebulization every 6 (six) hours as needed for wheezing or shortness of breath.   ALBUTEROL  (VENTOLIN  HFA) 108 (90 BASE) MCG/ACT INHALER    Inhale 1-2 puffs into the lungs every 6 (six) hours as needed for wheezing or shortness of breath.   ASCORBIC ACID  (VITAMIN C  WITH ROSE HIPS PO)    Take 1 tablet by mouth at bedtime.   ASPIRIN  EC 81 MG TABLET    Take 1 tablet (81 mg total) by mouth 2 (two) times daily.   B COMPLEX-C (SUPER B COMPLEX PO)    Take 1 tablet by mouth daily.   CALCIUM  CARB-CHOLECALCIFEROL  (CALCIUM  600+D3) 600-20 MG-MCG TABS    Take 1 tablet by mouth at bedtime.   CALCIUM  CARBONATE (TUMS - DOSED IN MG ELEMENTAL CALCIUM ) 500 MG CHEWABLE TABLET    Chew 2 tablets by mouth every 4 (four) hours as needed for indigestion or heartburn.   CHOLECALCIFEROL  (VITAMIN D3) 125 MCG (5000 UT) CAPS    Take 1 capsule by mouth daily.   COD LIVER OIL CAPS    Take 1 capsule by mouth daily.    CYANOCOBALAMIN  (VITAMIN B12) 500 MCG TABLET    Take 500 mcg by mouth daily.   CYCLOBENZAPRINE  (FLEXERIL ) 10 MG TABLET    TAKE 1 TABLET BY MOUTH TWICE  DAILY AS NEEDED FOR MUSCLE  SPASM(S)   ESCITALOPRAM  (LEXAPRO ) 20 MG TABLET    Take 0.5 tablets (10 mg total) by mouth daily.   FINASTERIDE  (PROSCAR ) 5 MG TABLET    TAKE 1 TABLET EVERY DAY   FLUTICASONE  (FLONASE ) 50 MCG/ACT NASAL SPRAY    USE 1 SPRAY IN BOTH NOSTRILS DAILY   GARLIC  1000 MG CAPS    Take 1,000 mg by mouth at bedtime.   HOMEOPATHIC PRODUCTS (ZICAM COLD REMEDY NA)    Place into the nose as needed.   IPRATROPIUM (ATROVENT) 0.02 % NEBULIZER  SOLUTION    Take 0.5 mg by nebulization 2 (two) times daily.   LOSARTAN  (COZAAR ) 100 MG TABLET    Take 1 tablet (100 mg total) by mouth daily.   MELATONIN 5 MG TABS    Take 5 mg by mouth at bedtime.    METHOCARBAMOL  (ROBAXIN ) 500 MG TABLET    Take 1 tablet (500 mg total) by mouth every 8 (eight) hours as needed for muscle spasms.   METHYLSULFONYLMETHANE (MSM) 1000 MG CAPS    Take 1,000 mg by mouth 2 (two) times daily.   METOPROLOL  SUCCINATE (TOPROL -XL) 25 MG 24 HR TABLET    Take 1 tablet (25 mg total) by mouth daily.   MULTIPLE VITAMINS-MINERALS (MULTIVITAMIN MEN 50+) TABS    Take 1 tablet by mouth daily.   ONDANSETRON  (ZOFRAN ) 4 MG TABLET    Take 1 tablet (4 mg total) by mouth every 8 (eight) hours as needed.   PANTOPRAZOLE  (PROTONIX ) 40 MG TABLET    TAKE 1 TABLET EVERY DAY   POLYETHYLENE GLYCOL (MIRALAX  / GLYCOLAX ) 17 G PACKET    Take 17 g by mouth daily as needed (constipation).   SAW PALMETTO  450 MG CAPS    Take 450 mg by mouth daily.   SELENIUM  200 MCG TABS TABLET    Take 200 mcg by mouth at bedtime.   SIMVASTATIN  (ZOCOR ) 20 MG TABLET    Take 20 mg by mouth at bedtime.   SOD FLUORIDE -POTASSIUM NITRATE  (SODIUM FLUORIDE  5000 ENAMEL DT)    Place 1 application  onto teeth at bedtime.   TAMSULOSIN  (FLOMAX ) 0.4 MG CAPS CAPSULE    TAKE 1 CAPSULE EVERY DAY   TIOTROPIUM BROMIDE  MONOHYDRATE (SPIRIVA   RESPIMAT) 2.5 MCG/ACT AERS    Inhale 2 puffs into the lungs daily.   TRIAMCINOLONE  CREAM (KENALOG ) 0.1 %    APPLY  CREAM EXTERNALLY TO AFFECTED AREA TWICE DAILY   VALACYCLOVIR  (VALTREX ) 1000 MG TABLET    Take 2 tablets (2,000 mg total) by mouth 2 (two) times daily.   VITAMIN E  180 MG (400 UNITS) CAPSULE    Take 400 Units by mouth at bedtime.  Modified Medications   No medications on file  Discontinued Medications   No medications on file    Physical Exam:  Vitals:   08/09/24 1324  BP: (!) 142/74  Pulse: 60  Temp: 99.2 F (37.3 C)  SpO2: 97%  Weight: 188 lb (85.3 kg)  Height: 6' (1.829 m)   Body mass index is 25.5 kg/m. Wt Readings from Last 3 Encounters:  08/09/24 188 lb (85.3 kg)  07/19/24 188 lb (85.3 kg)  05/16/24 188 lb (85.3 kg)    Physical Exam Constitutional:      General: He is not in acute distress.    Appearance: He is well-developed. He is not diaphoretic.  HENT:     Head: Normocephalic and atraumatic.     Right Ear: External ear normal.     Left Ear: External ear normal.     Mouth/Throat:     Pharynx: No oropharyngeal exudate.  Eyes:     Conjunctiva/sclera: Conjunctivae normal.     Pupils: Pupils are equal, round, and reactive to light.  Cardiovascular:     Rate and Rhythm: Normal rate and regular rhythm.     Heart sounds: Normal heart sounds.  Pulmonary:     Effort: Pulmonary effort is normal.     Breath sounds: Normal breath sounds.  Abdominal:     General: Bowel sounds are normal.  Palpations: Abdomen is soft.  Musculoskeletal:        General: No tenderness.     Cervical back: Neck supple. Decreased range of motion.     Right lower leg: No edema.     Left lower leg: No edema.  Skin:    General: Skin is warm and dry.  Neurological:     Mental Status: He is alert and oriented to person, place, and time.     Labs reviewed: Basic Metabolic Panel: Recent Labs    10/11/23 0745 12/13/23 1039 12/23/23 0000 05/01/24 0918 05/11/24 0000   NA 140 137 139 140 138  K 3.9 4.2 4.1 5.9* 3.9  CL 108 108 105 106 106  CO2 24 22 27* 26 24*  GLUCOSE 92 85  --  121*  --   BUN 22 15 18 19  24*  CREATININE 0.77 0.85 0.7 0.81 0.6  CALCIUM  9.4 9.3 8.9 10.5* 8.9   Liver Function Tests: Recent Labs    10/11/23 0745  AST 17  ALT 16  BILITOT 1.1  PROT 6.8   No results for input(s): LIPASE, AMYLASE in the last 8760 hours. No results for input(s): AMMONIA in the last 8760 hours. CBC: Recent Labs    10/11/23 0745 12/13/23 1039 12/23/23 0000 05/01/24 0918 05/11/24 0000  WBC 6.2 5.9 5.1 5.8 5.8  NEUTROABS 3,174  --  2,825.00  --  3,260.00  HGB 15.6 15.0 11.1* 15.0 11.2*  HCT 45.7 44.6 33* 46.0 34*  MCV 91.8 95.5  --  90.9  --   PLT 133* 159 144* 140* 153   Lipid Panel: Recent Labs    10/11/23 0745  CHOL 195  HDL 48  LDLCALC 124*  TRIG 115  CHOLHDL 4.1   TSH: No results for input(s): TSH in the last 8760 hours. A1C: No results found for: HGBA1C   Assessment/Plan Assessment and Plan Assessment & Plan Acute upper respiratory infection Likely viral etiology. Symptoms expected to last 7-14 days. Negative for influenza and COVID-19. - Mucinex DM twice daily for one week. - Saline nasal rinse daily. - Use humidifier to increase air humidity. - Tylenol  500 mg, two tablets every eight hours as needed, max six tablets in 24 hours. - Encouraged hydration and proper nutrition. - Advised vitamin C  and vitamin D  supplementation. - Monitor for worsening symptoms such as dyspnea or wheezing and notify  Cervicalgia Chronic neck pain with limited range of motion. Previous MRI and neurosurgical evaluation. Prednisone  effective but not recommended long-term. - Continue physical therapy with focus on neck exercises, new order given.  - Consider neurosurgery referral if pain persists or worsens. - Use Tylenol  for pain management as needed.   Gustaf Mccarter K. Joshua BODILY  [facility] [phone]     [1]  Allergies Allergen Reactions   Bee Pollen Anaphylaxis    Bee Venom   Bee Venom Anaphylaxis and Swelling    Throat, mouth swelling   "

## 2024-08-10 ENCOUNTER — Ambulatory Visit (INDEPENDENT_AMBULATORY_CARE_PROVIDER_SITE_OTHER)

## 2024-08-10 ENCOUNTER — Ambulatory Visit
Admission: EM | Admit: 2024-08-10 | Discharge: 2024-08-10 | Disposition: A | Discharge status: Home / Self Care | Attending: Emergency Medicine | Admitting: Emergency Medicine

## 2024-08-10 DIAGNOSIS — J441 Chronic obstructive pulmonary disease with (acute) exacerbation: Secondary | ICD-10-CM

## 2024-08-10 MED ORDER — PREDNISONE 10 MG PO TABS: 40.0000 mg | ORAL_TABLET | Freq: Every day | ORAL | 0 refills | Status: AC

## 2024-08-10 MED ORDER — ALBUTEROL SULFATE (2.5 MG/3ML) 0.083% IN NEBU
2.5000 mg | INHALATION_SOLUTION | Freq: Once | RESPIRATORY_TRACT | Status: AC
  Administered 2024-08-10: 2.5 mg via RESPIRATORY_TRACT

## 2024-08-10 MED ORDER — DOXYCYCLINE HYCLATE 100 MG PO CAPS: 100.0000 mg | ORAL_CAPSULE | Freq: Two times a day (BID) | ORAL | 0 refills | Status: AC

## 2024-08-10 NOTE — ED Provider Notes (Signed)
 " CAY RALPH PELT    CSN: [id number] Arrival date & time: 08/10/24  1528      History   Chief Complaint Chief Complaint  Patient presents with   Nasal Congestion    HPI Joshua Mercado is a 79 y.o. male.  Patient presents with 1 week history of congestion and cough.  He states his cough is productive.  No fever, chest pain, increased shortness of breath.  He has COPD and states he has not used his albuterol  nebulizer treatment today or done his lung exercises because he has not had time.  Patient was seen by his PCP from [facility] yesterday; diagnosed with upper respiratory infection and cervicalgia; symptomatic treatment for URI and referral to PT for cervicalgia.  Patient's medical history includes COPD.  The history is provided by the patient and medical records.    Past Medical History:  Diagnosis Date   Anxiety    Arthritis    Knees   COPD (chronic obstructive pulmonary disease) (HCC)    Depression    Does use hearing aid    Bilateral   Dyspnea    Dysrhythmia    Enlarged prostate    GERD (gastroesophageal reflux disease)    Hypercholesterolemia    Hypertension    Sleep apnea     Patient Active Problem List   Diagnosis Date Noted   Debility 05/09/2024   Constipation 05/09/2024   Status post total knee replacement, left 05/05/2024   Status post total knee replacement, right 12/17/2023   Current severe episode of major depressive disorder without psychotic features without prior episode (HCC) 10/14/2023   Mild aortic regurgitation 07/15/2023   PVC (premature ventricular contraction) 07/15/2023   Sinus arrhythmia 07/15/2023   White coat syndrome without diagnosis of hypertension 07/15/2023   Polyp of descending colon    Arthritis of both knees 06/02/2021   Esophageal dysphagia    Stricture and stenosis of esophagus    Thrombocytopenia 05/19/2020   Benign prostatic hyperplasia with nocturia 05/13/2020   Low testosterone  05/13/2020   Primary  insomnia 05/13/2020   Chronic obstructive pulmonary disease (HCC) 01/19/2020   Mixed hyperlipidemia 01/19/2020   Essential hypertension 01/19/2020   Family hx of prostate cancer 01/19/2020   Osteoarthritis of right knee 07/27/2019   Moderate obstructive sleep apnea 01/30/2016   Snoring 01/30/2016    Past Surgical History:  Procedure Laterality Date   CHEEK AUGMENTATION Left 1970   REstructure of cheek bone    COLONOSCOPY  2017   Dr. Nellie   COLONOSCOPY WITH PROPOFOL  N/A 05/05/2022   Procedure: COLONOSCOPY WITH PROPOFOL ;  Surgeon: Jinny Carmine, MD;  Location: [facility] ENDOSCOPY;  Service: Endoscopy;  Laterality: N/A;   ESOPHAGOGASTRODUODENOSCOPY (EGD) WITH PROPOFOL  N/A 12/05/2020   Procedure: ESOPHAGOGASTRODUODENOSCOPY (EGD) WITH PROPOFOL ;  Surgeon: Jinny Carmine, MD;  Location: [facility] ENDOSCOPY;  Service: Endoscopy;  Laterality: N/A;   EYE SURGERY Bilateral 2005   NASAL SEPTOPLASTY W/ TURBINOPLASTY Bilateral 12/24/2020   Procedure: NASAL SEPTOPLASTY WITH INFERIOR TURBINATE REDUCTION;  Surgeon: Milissa Hamming, MD;  Location: [facility] ORS;  Service: ENT;  Laterality: Bilateral;   TOTAL KNEE ARTHROPLASTY Right 12/17/2023   Procedure: ARTHROPLASTY, KNEE, TOTAL;  Surgeon: Kay Kemps, MD;  Location: [facility] ORS;  Service: Orthopedics;  Laterality: Right;   TOTAL KNEE ARTHROPLASTY Left 05/05/2024   Procedure: ARTHROPLASTY, KNEE, TOTAL;  Surgeon: Kay Kemps, MD;  Location: [facility] ORS;  Service: Orthopedics;  Laterality: Left;   UPPER GI ENDOSCOPY         Home Medications  Prior to Admission medications  Medication Sig Start Date End Date Taking? Authorizing Provider  doxycycline  (VIBRAMYCIN ) 100 MG capsule Take 1 capsule (100 mg total) by mouth 2 (two) times daily for 7 days. 08/10/24 08/17/24 Yes Corlis Burnard DEL, NP  predniSONE  (DELTASONE ) 10 MG tablet Take 4 tablets (40 mg total) by mouth daily for 5 days. 08/10/24 08/15/24 Yes Corlis Burnard DEL, NP  acetaminophen  (TYLENOL ) 325 MG tablet Take 650 mg by mouth  every 8 (eight) hours as needed.    [provider]  acetaminophen  (TYLENOL ) 500 MG tablet Take 1,000 mg by mouth every 8 (eight) hours as needed.    [provider]  acetaminophen  (TYLENOL ) 650 MG CR tablet Take 650 mg by mouth 3 (three) times daily as needed for fever or pain.    [provider]  albuterol  (PROVENTIL ) (2.5 MG/3ML) 0.083% nebulizer solution Take 3 mLs (2.5 mg total) by nebulization every 6 (six) hours as needed for wheezing or shortness of breath. 10/08/22   Caro Harlene POUR, NP  albuterol  (VENTOLIN  HFA) 108 (90 Base) MCG/ACT inhaler Inhale 1-2 puffs into the lungs every 6 (six) hours as needed for wheezing or shortness of breath.    [provider]  Ascorbic Acid  (VITAMIN C  WITH ROSE HIPS PO) Take 1 tablet by mouth at bedtime.    [provider]  aspirin  EC 81 MG tablet Take 1 tablet (81 mg total) by mouth 2 (two) times daily. 05/05/24   Kay Kemps, MD  B Complex-C (SUPER B COMPLEX PO) Take 1 tablet by mouth daily.    [provider]  Calcium  Carb-Cholecalciferol  (CALCIUM  600+D3) 600-20 MG-MCG TABS Take 1 tablet by mouth at bedtime.    [provider]  calcium  carbonate (TUMS - DOSED IN MG ELEMENTAL CALCIUM ) 500 MG chewable tablet Chew 2 tablets by mouth every 4 (four) hours as needed for indigestion or heartburn.    [provider]  Cholecalciferol  (VITAMIN D3) 125 MCG (5000 UT) CAPS Take 1 capsule by mouth daily.    [provider]  [facility] Liver Oil CAPS Take 1 capsule by mouth daily.    [provider]  cyanocobalamin  (VITAMIN B12) 500 MCG tablet Take 500 mcg by mouth daily.    [provider]  cyclobenzaprine  (FLEXERIL ) 10 MG tablet TAKE 1 TABLET BY MOUTH TWICE  DAILY AS NEEDED FOR MUSCLE  SPASM(S) 07/14/22   Caro Harlene POUR, NP  escitalopram  (LEXAPRO ) 20 MG tablet Take 0.5 tablets (10 mg total) by mouth daily. 01/18/24   Caro Harlene POUR, NP  finasteride  (PROSCAR ) 5 MG tablet  TAKE 1 TABLET EVERY DAY 10/14/23   Eubanks, Jessica K, NP  fluticasone  (FLONASE ) 50 MCG/ACT nasal spray USE 1 SPRAY IN BOTH NOSTRILS DAILY 03/09/24   Eubanks, Jessica K, NP  Garlic  1000 MG CAPS Take 1,000 mg by mouth at bedtime.    [provider]  Homeopathic Products [facility] COLD REMEDY NA) Place into the nose as needed.    [provider]  ipratropium (ATROVENT) 0.02 % nebulizer solution Take 0.5 mg by nebulization 2 (two) times daily.    [provider]  losartan  (COZAAR ) 100 MG tablet Take 1 tablet (100 mg total) by mouth daily. 04/13/24   Eubanks, Jessica K, NP  melatonin 5 MG TABS Take 5 mg by mouth at bedtime.     [provider]  methocarbamol  (ROBAXIN ) 500 MG tablet Take 1 tablet (500 mg total) by mouth every 8 (eight) hours as needed for muscle spasms.  05/05/24   Kay Kemps, MD  Methylsulfonylmethane (MSM) 1000 MG CAPS Take 1,000 mg by mouth 2 (two) times daily.    [provider]  metoprolol  succinate (TOPROL -XL) 25 MG 24 hr tablet Take 1 tablet (25 mg total) by mouth daily. 04/13/24   Eubanks, Jessica K, NP  Multiple Vitamins-Minerals (MULTIVITAMIN MEN 50+) TABS Take 1 tablet by mouth daily.    [provider]  ondansetron  (ZOFRAN ) 4 MG tablet Take 1 tablet (4 mg total) by mouth every 8 (eight) hours as needed. 05/05/24 05/05/25  Kay Kemps, MD  pantoprazole  (PROTONIX ) 40 MG tablet TAKE 1 TABLET EVERY DAY 04/10/24   Eubanks, Jessica K, NP  polyethylene glycol (MIRALAX  / GLYCOLAX ) 17 g packet Take 17 g by mouth daily as needed (constipation).    [provider]  Saw Palmetto  450 MG CAPS Take 450 mg by mouth daily.    [provider]  selenium  200 MCG TABS tablet Take 200 mcg by mouth at bedtime.    [provider]  simvastatin  (ZOCOR ) 20 MG tablet Take 20 mg by mouth at bedtime.    [provider]  Sod Fluoride -Potassium Nitrate  (SODIUM FLUORIDE  5000 ENAMEL DT) Place 1 application  onto teeth at  bedtime.    [provider]  tamsulosin  (FLOMAX ) 0.4 MG CAPS capsule TAKE 1 CAPSULE EVERY DAY 10/14/23   Eubanks, Jessica K, NP  Tiotropium Bromide  Monohydrate (SPIRIVA  RESPIMAT) 2.5 MCG/ACT AERS Inhale 2 puffs into the lungs daily. 09/08/22   Parrett, Madelin RAMAN, NP  triamcinolone  cream (KENALOG ) 0.1 % APPLY  CREAM EXTERNALLY TO AFFECTED AREA TWICE DAILY 05/01/21   Eubanks, Jessica K, NP  valACYclovir  (VALTREX ) 1000 MG tablet Take 2 tablets (2,000 mg total) by mouth 2 (two) times daily. 03/14/24   Caro Harlene POUR, NP  vitamin E  180 MG (400 UNITS) capsule Take 400 Units by mouth at bedtime.    [provider]    Family History Family History  Problem Relation Age of Onset   Alzheimer's disease Mother    Diabetes Mother    Heart failure Father    Heart attack Father    Diabetes Father    Dementia Father     Social History Social History[1]   Allergies   Bee pollen and Bee venom   Review of Systems Review of Systems  Constitutional:  Negative for chills and fever.  HENT:  Positive for congestion. Negative for ear pain and sore throat.   Respiratory:  Positive for cough. Negative for shortness of breath.   Cardiovascular:  Negative for chest pain and palpitations.     Physical Exam Triage Vital Signs ED Triage Vitals [08/10/24 1558]  Encounter Vitals Group     BP 134/71     Girls Systolic BP Percentile      Girls Diastolic BP Percentile      Boys Systolic BP Percentile      Boys Diastolic BP Percentile      Pulse Rate 74     Resp 16     Temp 98.1 F (36.7 C)     Temp Source Oral     SpO2 92 %     Weight      Height      Head Circumference      Peak Flow      Pain Score 0     Pain Loc      Pain Education      Exclude from Growth Chart    No data found.  Updated Vital Signs BP 134/71 (BP Location: Right Arm)   Pulse 78   Temp 98.1 F (36.7 C) (Oral)   Resp 18   SpO2 (S) 97% Comment: post neb treatment  Visual Acuity Right Eye Distance:    Left Eye Distance:   Bilateral Distance:    Right Eye Near:   Left Eye Near:    Bilateral Near:     Physical Exam Constitutional:      General: He is not in acute distress. HENT:     Right Ear: Tympanic membrane normal.     Left Ear: Tympanic membrane normal.     Nose: Nose normal.     Mouth/Throat:     Mouth: Mucous membranes are moist.     Pharynx: Oropharynx is clear.  Cardiovascular:     Rate and Rhythm: Normal rate and regular rhythm.     Heart sounds: Normal heart sounds.  Pulmonary:     Effort: Pulmonary effort is normal. No respiratory distress.     Breath sounds: Normal breath sounds.  Neurological:     Mental Status: He is alert.      UC Treatments / Results  Labs (all labs ordered are listed, but only abnormal results are displayed) Labs Reviewed - No data to display  EKG   Radiology DG Chest 2 View Result Date: 08/10/2024 EXAM: 2 VIEW(S) XRAY OF THE CHEST 08/10/2024 04:35:48 PM COMPARISON: 04/04/2024 CLINICAL HISTORY: Congestion and cough; O2 saturation 92-93% on room air. FINDINGS: LUNGS AND PLEURA: Calcified granuloma at right lung base again seen. No pleural effusion. No pneumothorax. HEART AND MEDIASTINUM: No acute abnormality of the cardiac and mediastinal silhouettes. BONES AND SOFT TISSUES: No acute osseous abnormality. IMPRESSION: 1. No acute cardiopulmonary process. Electronically signed by: Elsie Gravely MD 08/10/2024 05:28 PM EST RP Workstation: [id number]    Procedures Procedures (including critical care time)  Medications Ordered in UC Medications  albuterol  (PROVENTIL ) (2.5 MG/3ML) 0.083% nebulizer solution 2.5 mg (2.5 mg Nebulization Given 08/10/24 1636)    Initial Impression / Assessment and Plan / UC Course  I have reviewed the triage vital signs and the nursing notes.  Pertinent labs & imaging results that were available during my care of the patient were reviewed by me and considered in my medical decision making (see chart for  details).    COPD exacerbation.  O2 sat 92 to 93% on room air upon arrival.  Patient had not done his albuterol  nebulizer treatments today.  Albuterol  nebulizer treatment given here and O2 sat improved to 97% on room air. CXR negative.  Treating COPD flare with doxycycline  and prednisone .  Instructed patient to use his albuterol  inhaler or nebulizer as directed.  Education provided on COPD exacerbation.  Instructed him to follow-up with his PCP tomorrow.  ED precautions given.  He agrees to plan of care.    Final Clinical Impressions(s) / UC Diagnoses   Final diagnoses:  COPD exacerbation (HCC)     Discharge Instructions      Take the doxycycline  and prednisone  as directed for your COPD flare.    Use your albuterol  inhaler or nebulizer treatments as directed.    Follow up with your primary care provider tomorrow.  Go to the emergency department if you have worsening symptoms.        ED Prescriptions     Medication Sig Dispense Auth. Provider   predniSONE  (DELTASONE ) 10 MG tablet Take 4 tablets (40 mg total) by mouth daily for 5 days. 20 tablet Corlis Burnard DEL,  NP   doxycycline  (VIBRAMYCIN ) 100 MG capsule Take 1 capsule (100 mg total) by mouth 2 (two) times daily for 7 days. 14 capsule Corlis Burnard DEL, NP      PDMP not reviewed this encounter.     [1]  Social History Tobacco Use   Smoking status: Former    Current packs/day: 0.00    Average packs/day: 1 pack/day for 35.0 years (35.0 ttl pk-yrs)    Types: Cigarettes    Start date: 89    Quit date: 60    Years since quitting: 41.1   Smokeless tobacco: Never  Vaping Use   Vaping status: Never Used  Substance Use Topics   Alcohol use: Not Currently   Drug use: Never     Corlis Burnard DEL, NP 08/10/24 1745  "

## 2024-08-10 NOTE — ED Triage Notes (Signed)
 Patient presents to UC for runny nose, and congestion x 1 week. Reports low gradeer x 3 days. Hx of COPD. He tested negative for COVID/flu yesterday. He reports using zinc , mucinex.   Denies SOB.

## 2024-08-10 NOTE — Discharge Instructions (Addendum)
 Take the doxycycline  and prednisone  as directed for your COPD flare.    Use your albuterol  inhaler or nebulizer treatments as directed.    Follow up with your primary care provider tomorrow.  Go to the emergency department if you have worsening symptoms.

## 2024-08-17 ENCOUNTER — Ambulatory Visit: Payer: Self-pay

## 2024-08-17 NOTE — Telephone Encounter (Signed)
 1st attempt to contact patient. Left message to return call via [phone]. Call placed back in TQ for additional attempts.  Message from [place] sent at 08/17/2024 12:31 PM EST  Summary: [phone]:[facility]   Reason for Triage: cold, mucous color Gervais, coughing, took flu/covid/rsv came back negative, currently a copd pt. No SOB wanting to schedule

## 2024-08-17 NOTE — Telephone Encounter (Signed)
 Unable to reach patient for triage x 3 attempts.

## 2024-08-18 NOTE — Telephone Encounter (Signed)
 Left detail voicemail for patient to call the office because the triage nurse and myself were trying to schedule patient to be seen at [facility] with Greig Cluster, NP this morning. Patient reach out to our office stating that he was having cold like/flu symptoms and that patient currently has COPD and tested negative for Covid/Flu/RSV. Will try to call patient again to set up an appointment    Message sent to Caro Harlene POUR, NP

## 2024-08-21 ENCOUNTER — Encounter: Admitting: Orthopedic Surgery

## 2024-09-04 ENCOUNTER — Encounter: Admitting: Orthopedic Surgery

## 2024-09-06 ENCOUNTER — Encounter: Admitting: Orthopedic Surgery

## 2024-10-05 ENCOUNTER — Ambulatory Visit: Payer: Self-pay | Admitting: Nurse Practitioner

## 2025-03-13 ENCOUNTER — Ambulatory Visit: Payer: Self-pay | Admitting: Nurse Practitioner
